# Patient Record
Sex: Female | Born: 1965 | Race: White | Hispanic: No | Smoking: Former smoker
Health system: Southern US, Community
[De-identification: ages and names within clinical notes are randomized; demographics above are authoritative.]

## PROBLEM LIST (undated history)

## (undated) DIAGNOSIS — J939 Pneumothorax, unspecified: Secondary | ICD-10-CM

## (undated) DIAGNOSIS — J9383 Other pneumothorax: Secondary | ICD-10-CM

## (undated) DIAGNOSIS — J930 Spontaneous tension pneumothorax: Principal | ICD-10-CM

## (undated) DIAGNOSIS — G8929 Other chronic pain: Secondary | ICD-10-CM

## (undated) DIAGNOSIS — M810 Age-related osteoporosis without current pathological fracture: Secondary | ICD-10-CM

## (undated) DIAGNOSIS — N390 Urinary tract infection, site not specified: Secondary | ICD-10-CM

## (undated) DIAGNOSIS — F3289 Other specified depressive episodes: Secondary | ICD-10-CM

## (undated) DIAGNOSIS — R569 Unspecified convulsions: Secondary | ICD-10-CM

## (undated) DIAGNOSIS — F419 Anxiety disorder, unspecified: Secondary | ICD-10-CM

## (undated) DIAGNOSIS — Q059 Spina bifida, unspecified: Secondary | ICD-10-CM

## (undated) DIAGNOSIS — K219 Gastro-esophageal reflux disease without esophagitis: Secondary | ICD-10-CM

## (undated) DIAGNOSIS — F329 Major depressive disorder, single episode, unspecified: Secondary | ICD-10-CM

## (undated) DIAGNOSIS — M419 Scoliosis, unspecified: Secondary | ICD-10-CM

## (undated) DIAGNOSIS — I10 Essential (primary) hypertension: Secondary | ICD-10-CM

## (undated) DIAGNOSIS — G894 Chronic pain syndrome: Secondary | ICD-10-CM

## (undated) DIAGNOSIS — M549 Dorsalgia, unspecified: Secondary | ICD-10-CM

## (undated) HISTORY — PX: LUMBAR LAMINECTOMY FOR TETHERED CORD RELEASE: SHX1982

## (undated) HISTORY — DX: Chronic pain syndrome: G89.4

## (undated) HISTORY — PX: BACK SURGERY: SHX140

## (undated) HISTORY — DX: Unspecified convulsions: R56.9

## (undated) HISTORY — DX: Anxiety disorder, unspecified: F41.9

## (undated) HISTORY — DX: Scoliosis, unspecified: M41.9

## (undated) HISTORY — DX: Spina bifida, unspecified: Q05.9

## (undated) HISTORY — DX: Gastro-esophageal reflux disease without esophagitis: K21.9

## (undated) HISTORY — DX: Major depressive disorder, single episode, unspecified: F32.9

## (undated) HISTORY — DX: Other specified depressive episodes: F32.89

## (undated) HISTORY — DX: Essential (primary) hypertension: I10

## (undated) HISTORY — DX: Age-related osteoporosis without current pathological fracture: M81.0

---
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
## 1997-11-27 ENCOUNTER — Emergency Department (HOSPITAL_COMMUNITY): Admission: EM | Admit: 1997-11-27 | Discharge: 1997-11-27 | Payer: Self-pay | Admitting: Emergency Medicine

## 1997-12-03 ENCOUNTER — Emergency Department (HOSPITAL_COMMUNITY): Admission: EM | Admit: 1997-12-03 | Discharge: 1997-12-03 | Payer: Self-pay | Admitting: Emergency Medicine

## 1998-08-30 ENCOUNTER — Encounter: Payer: Self-pay | Admitting: Emergency Medicine

## 1998-08-30 ENCOUNTER — Emergency Department (HOSPITAL_COMMUNITY): Admission: EM | Admit: 1998-08-30 | Discharge: 1998-08-30 | Payer: Self-pay | Admitting: Emergency Medicine

## 1999-02-22 ENCOUNTER — Encounter: Admission: RE | Admit: 1999-02-22 | Discharge: 1999-02-22 | Payer: Self-pay | Admitting: Family Medicine

## 1999-02-22 ENCOUNTER — Encounter: Payer: Self-pay | Admitting: Family Medicine

## 2000-06-17 ENCOUNTER — Other Ambulatory Visit: Admission: RE | Admit: 2000-06-17 | Discharge: 2000-06-17 | Payer: Self-pay | Admitting: Obstetrics and Gynecology

## 2000-07-16 ENCOUNTER — Encounter: Payer: Self-pay | Admitting: Emergency Medicine

## 2000-07-16 ENCOUNTER — Emergency Department (HOSPITAL_COMMUNITY): Admission: EM | Admit: 2000-07-16 | Discharge: 2000-07-16 | Payer: Self-pay | Admitting: Emergency Medicine

## 2000-11-01 ENCOUNTER — Encounter: Payer: Self-pay | Admitting: *Deleted

## 2000-11-01 ENCOUNTER — Ambulatory Visit (HOSPITAL_COMMUNITY): Admission: RE | Admit: 2000-11-01 | Discharge: 2000-11-01 | Payer: Self-pay | Admitting: *Deleted

## 2002-04-04 ENCOUNTER — Other Ambulatory Visit: Admission: RE | Admit: 2002-04-04 | Discharge: 2002-04-04 | Payer: Self-pay | Admitting: Obstetrics and Gynecology

## 2003-07-15 ENCOUNTER — Ambulatory Visit (HOSPITAL_COMMUNITY): Admission: RE | Admit: 2003-07-15 | Discharge: 2003-07-15 | Payer: Self-pay | Admitting: Family Medicine

## 2003-11-08 ENCOUNTER — Emergency Department (HOSPITAL_COMMUNITY): Admission: EM | Admit: 2003-11-08 | Discharge: 2003-11-08 | Payer: Self-pay | Admitting: Emergency Medicine

## 2004-01-10 ENCOUNTER — Ambulatory Visit: Payer: Self-pay | Admitting: *Deleted

## 2004-01-29 ENCOUNTER — Ambulatory Visit: Payer: Self-pay | Admitting: *Deleted

## 2004-03-13 ENCOUNTER — Ambulatory Visit: Payer: Self-pay | Admitting: Family Medicine

## 2004-03-15 ENCOUNTER — Ambulatory Visit: Payer: Self-pay | Admitting: Family Medicine

## 2004-04-11 ENCOUNTER — Ambulatory Visit: Payer: Self-pay | Admitting: Family Medicine

## 2004-10-15 ENCOUNTER — Encounter
Admission: RE | Admit: 2004-10-15 | Discharge: 2005-01-13 | Payer: Self-pay | Admitting: Physical Medicine & Rehabilitation

## 2004-10-15 ENCOUNTER — Ambulatory Visit: Payer: Self-pay | Admitting: Physical Medicine & Rehabilitation

## 2004-11-28 ENCOUNTER — Ambulatory Visit: Payer: Self-pay | Admitting: Physical Medicine & Rehabilitation

## 2004-11-29 ENCOUNTER — Emergency Department (HOSPITAL_COMMUNITY): Admission: EM | Admit: 2004-11-29 | Discharge: 2004-11-29 | Payer: Self-pay | Admitting: Emergency Medicine

## 2004-11-29 ENCOUNTER — Inpatient Hospital Stay (HOSPITAL_COMMUNITY): Admission: RE | Admit: 2004-11-29 | Discharge: 2004-12-01 | Payer: Self-pay | Admitting: Psychiatry

## 2004-11-29 ENCOUNTER — Ambulatory Visit: Payer: Self-pay | Admitting: Psychiatry

## 2004-12-31 ENCOUNTER — Ambulatory Visit: Payer: Self-pay | Admitting: Physical Medicine & Rehabilitation

## 2005-01-16 ENCOUNTER — Encounter
Admission: RE | Admit: 2005-01-16 | Discharge: 2005-04-16 | Payer: Self-pay | Admitting: Physical Medicine & Rehabilitation

## 2005-02-11 ENCOUNTER — Ambulatory Visit: Payer: Self-pay | Admitting: Physical Medicine & Rehabilitation

## 2005-04-05 ENCOUNTER — Ambulatory Visit: Payer: Self-pay | Admitting: Physical Medicine & Rehabilitation

## 2005-05-07 ENCOUNTER — Encounter
Admission: RE | Admit: 2005-05-07 | Discharge: 2005-08-05 | Payer: Self-pay | Admitting: Physical Medicine & Rehabilitation

## 2005-05-07 ENCOUNTER — Ambulatory Visit: Payer: Self-pay | Admitting: Physical Medicine & Rehabilitation

## 2005-06-18 ENCOUNTER — Encounter: Admission: RE | Admit: 2005-06-18 | Discharge: 2005-06-18 | Payer: Self-pay | Admitting: Family Medicine

## 2005-06-24 ENCOUNTER — Encounter: Admission: RE | Admit: 2005-06-24 | Discharge: 2005-06-24 | Payer: Self-pay | Admitting: Family Medicine

## 2005-06-26 ENCOUNTER — Other Ambulatory Visit: Admission: RE | Admit: 2005-06-26 | Discharge: 2005-06-26 | Payer: Self-pay | Admitting: Obstetrics and Gynecology

## 2005-07-02 ENCOUNTER — Ambulatory Visit: Payer: Self-pay | Admitting: Physical Medicine & Rehabilitation

## 2005-08-26 ENCOUNTER — Encounter
Admission: RE | Admit: 2005-08-26 | Discharge: 2005-11-24 | Payer: Self-pay | Admitting: Physical Medicine & Rehabilitation

## 2005-08-26 ENCOUNTER — Ambulatory Visit: Payer: Self-pay | Admitting: Physical Medicine & Rehabilitation

## 2005-10-17 ENCOUNTER — Ambulatory Visit: Payer: Self-pay | Admitting: Physical Medicine & Rehabilitation

## 2005-12-17 ENCOUNTER — Ambulatory Visit: Payer: Self-pay | Admitting: Physical Medicine & Rehabilitation

## 2005-12-17 ENCOUNTER — Encounter
Admission: RE | Admit: 2005-12-17 | Discharge: 2006-03-17 | Payer: Self-pay | Admitting: Physical Medicine & Rehabilitation

## 2006-02-06 ENCOUNTER — Ambulatory Visit: Payer: Self-pay | Admitting: Physical Medicine & Rehabilitation

## 2006-04-03 ENCOUNTER — Ambulatory Visit: Payer: Self-pay | Admitting: Physical Medicine & Rehabilitation

## 2006-04-03 ENCOUNTER — Encounter
Admission: RE | Admit: 2006-04-03 | Discharge: 2006-07-02 | Payer: Self-pay | Admitting: Physical Medicine & Rehabilitation

## 2006-05-07 ENCOUNTER — Ambulatory Visit: Payer: Self-pay | Admitting: Physical Medicine & Rehabilitation

## 2006-05-07 ENCOUNTER — Encounter
Admission: RE | Admit: 2006-05-07 | Discharge: 2006-08-05 | Payer: Self-pay | Admitting: Physical Medicine & Rehabilitation

## 2006-06-03 ENCOUNTER — Ambulatory Visit: Payer: Self-pay | Admitting: Physical Medicine & Rehabilitation

## 2006-07-21 ENCOUNTER — Other Ambulatory Visit: Admission: RE | Admit: 2006-07-21 | Discharge: 2006-07-21 | Payer: Self-pay | Admitting: Obstetrics and Gynecology

## 2006-07-28 ENCOUNTER — Ambulatory Visit: Payer: Self-pay | Admitting: Physical Medicine & Rehabilitation

## 2006-08-24 ENCOUNTER — Encounter
Admission: RE | Admit: 2006-08-24 | Discharge: 2006-11-22 | Payer: Self-pay | Admitting: Physical Medicine & Rehabilitation

## 2006-09-18 ENCOUNTER — Ambulatory Visit: Payer: Self-pay | Admitting: Physical Medicine & Rehabilitation

## 2006-12-21 ENCOUNTER — Ambulatory Visit: Payer: Self-pay | Admitting: Physical Medicine & Rehabilitation

## 2006-12-21 ENCOUNTER — Encounter
Admission: RE | Admit: 2006-12-21 | Discharge: 2007-02-02 | Payer: Self-pay | Admitting: Physical Medicine & Rehabilitation

## 2007-01-15 ENCOUNTER — Encounter
Admission: RE | Admit: 2007-01-15 | Discharge: 2007-01-15 | Payer: Self-pay | Admitting: Physical Medicine & Rehabilitation

## 2007-01-18 ENCOUNTER — Ambulatory Visit: Payer: Self-pay | Admitting: Physical Medicine & Rehabilitation

## 2007-02-03 ENCOUNTER — Ambulatory Visit (HOSPITAL_COMMUNITY)
Admission: RE | Admit: 2007-02-03 | Discharge: 2007-02-03 | Payer: Self-pay | Admitting: Physical Medicine & Rehabilitation

## 2007-02-08 ENCOUNTER — Emergency Department (HOSPITAL_COMMUNITY): Admission: EM | Admit: 2007-02-08 | Discharge: 2007-02-08 | Payer: Self-pay | Admitting: Emergency Medicine

## 2007-02-18 ENCOUNTER — Ambulatory Visit: Payer: Self-pay | Admitting: Physical Medicine & Rehabilitation

## 2007-02-18 ENCOUNTER — Encounter
Admission: RE | Admit: 2007-02-18 | Discharge: 2007-05-19 | Payer: Self-pay | Admitting: Physical Medicine & Rehabilitation

## 2007-02-26 ENCOUNTER — Encounter: Payer: Self-pay | Admitting: Internal Medicine

## 2007-03-17 ENCOUNTER — Ambulatory Visit (HOSPITAL_COMMUNITY): Admission: RE | Admit: 2007-03-17 | Discharge: 2007-03-17 | Payer: Self-pay | Admitting: Neurological Surgery

## 2007-04-20 ENCOUNTER — Ambulatory Visit: Payer: Self-pay | Admitting: Physical Medicine & Rehabilitation

## 2007-04-26 ENCOUNTER — Encounter: Payer: Self-pay | Admitting: Internal Medicine

## 2007-05-13 ENCOUNTER — Encounter
Admission: RE | Admit: 2007-05-13 | Discharge: 2007-08-11 | Payer: Self-pay | Admitting: Physical Medicine & Rehabilitation

## 2007-06-07 ENCOUNTER — Encounter: Payer: Self-pay | Admitting: Internal Medicine

## 2007-06-08 ENCOUNTER — Ambulatory Visit: Payer: Self-pay | Admitting: Physical Medicine & Rehabilitation

## 2007-08-04 ENCOUNTER — Encounter
Admission: RE | Admit: 2007-08-04 | Discharge: 2007-11-02 | Payer: Self-pay | Admitting: Physical Medicine & Rehabilitation

## 2007-08-06 ENCOUNTER — Ambulatory Visit: Payer: Self-pay | Admitting: Physical Medicine & Rehabilitation

## 2007-09-01 ENCOUNTER — Ambulatory Visit: Payer: Self-pay | Admitting: Physical Medicine & Rehabilitation

## 2007-09-29 ENCOUNTER — Ambulatory Visit: Payer: Self-pay | Admitting: Physical Medicine & Rehabilitation

## 2007-10-27 ENCOUNTER — Encounter
Admission: RE | Admit: 2007-10-27 | Discharge: 2008-01-25 | Payer: Self-pay | Admitting: Physical Medicine & Rehabilitation

## 2007-10-27 ENCOUNTER — Ambulatory Visit: Payer: Self-pay | Admitting: Physical Medicine & Rehabilitation

## 2007-11-24 ENCOUNTER — Ambulatory Visit: Payer: Self-pay | Admitting: Physical Medicine & Rehabilitation

## 2007-12-22 ENCOUNTER — Ambulatory Visit: Payer: Self-pay | Admitting: Physical Medicine & Rehabilitation

## 2008-01-19 ENCOUNTER — Ambulatory Visit: Payer: Self-pay | Admitting: Physical Medicine & Rehabilitation

## 2008-02-07 ENCOUNTER — Encounter: Payer: Self-pay | Admitting: Internal Medicine

## 2008-02-07 LAB — CONVERTED CEMR LAB
AST: 23 units/L
CO2: 26 meq/L
Calcium: 9.9 mg/dL
Chloride: 105 meq/L
Hemoglobin: 13.3 g/dL
Potassium: 5.1 meq/L
RBC: 4.29 M/uL
Sodium: 141 meq/L
TSH: 0.75 microintl units/mL
WBC: 4.8 10*3/uL

## 2008-02-15 ENCOUNTER — Encounter
Admission: RE | Admit: 2008-02-15 | Discharge: 2008-05-15 | Payer: Self-pay | Admitting: Physical Medicine & Rehabilitation

## 2008-02-16 ENCOUNTER — Ambulatory Visit: Payer: Self-pay | Admitting: Physical Medicine & Rehabilitation

## 2008-03-01 ENCOUNTER — Ambulatory Visit: Payer: Self-pay | Admitting: Physical Medicine & Rehabilitation

## 2008-03-29 ENCOUNTER — Ambulatory Visit: Payer: Self-pay | Admitting: Physical Medicine & Rehabilitation

## 2008-04-06 ENCOUNTER — Encounter: Payer: Self-pay | Admitting: Internal Medicine

## 2008-04-25 ENCOUNTER — Ambulatory Visit: Payer: Self-pay | Admitting: Physical Medicine & Rehabilitation

## 2008-05-08 ENCOUNTER — Encounter: Payer: Self-pay | Admitting: Internal Medicine

## 2008-05-08 LAB — CONVERTED CEMR LAB
CO2: 27 meq/L
Calcium: 9.1 mg/dL
Chloride: 102 meq/L
Hgb A1c MFr Bld: 5.4 %

## 2008-05-09 ENCOUNTER — Other Ambulatory Visit: Admission: RE | Admit: 2008-05-09 | Discharge: 2008-05-09 | Payer: Self-pay | Admitting: Obstetrics and Gynecology

## 2008-05-19 ENCOUNTER — Encounter
Admission: RE | Admit: 2008-05-19 | Discharge: 2008-08-16 | Payer: Self-pay | Admitting: Physical Medicine & Rehabilitation

## 2008-05-24 ENCOUNTER — Ambulatory Visit: Payer: Self-pay | Admitting: Physical Medicine & Rehabilitation

## 2008-06-01 ENCOUNTER — Encounter: Payer: Self-pay | Admitting: Internal Medicine

## 2008-06-21 ENCOUNTER — Ambulatory Visit: Payer: Self-pay | Admitting: Physical Medicine & Rehabilitation

## 2008-06-27 ENCOUNTER — Encounter: Payer: Self-pay | Admitting: Internal Medicine

## 2008-07-19 ENCOUNTER — Ambulatory Visit: Payer: Self-pay | Admitting: Physical Medicine & Rehabilitation

## 2008-07-31 ENCOUNTER — Encounter: Payer: Self-pay | Admitting: Internal Medicine

## 2008-08-16 ENCOUNTER — Encounter
Admission: RE | Admit: 2008-08-16 | Discharge: 2008-11-14 | Payer: Self-pay | Admitting: Physical Medicine & Rehabilitation

## 2008-08-18 ENCOUNTER — Ambulatory Visit: Payer: Self-pay | Admitting: Physical Medicine & Rehabilitation

## 2008-09-19 ENCOUNTER — Ambulatory Visit: Payer: Self-pay | Admitting: Physical Medicine & Rehabilitation

## 2008-10-20 ENCOUNTER — Ambulatory Visit: Payer: Self-pay | Admitting: Physical Medicine & Rehabilitation

## 2008-11-10 ENCOUNTER — Encounter
Admission: RE | Admit: 2008-11-10 | Discharge: 2009-02-08 | Payer: Self-pay | Admitting: Physical Medicine & Rehabilitation

## 2008-11-15 ENCOUNTER — Ambulatory Visit: Payer: Self-pay | Admitting: Physical Medicine & Rehabilitation

## 2008-12-18 ENCOUNTER — Ambulatory Visit: Payer: Self-pay | Admitting: Physical Medicine & Rehabilitation

## 2008-12-19 ENCOUNTER — Encounter: Payer: Self-pay | Admitting: Internal Medicine

## 2008-12-28 ENCOUNTER — Encounter: Payer: Self-pay | Admitting: Internal Medicine

## 2009-03-06 ENCOUNTER — Encounter: Payer: Self-pay | Admitting: Internal Medicine

## 2009-05-24 ENCOUNTER — Other Ambulatory Visit: Admission: RE | Admit: 2009-05-24 | Discharge: 2009-05-24 | Payer: Self-pay | Admitting: Obstetrics and Gynecology

## 2009-05-24 ENCOUNTER — Encounter: Payer: Self-pay | Admitting: Internal Medicine

## 2009-05-24 LAB — CONVERTED CEMR LAB

## 2009-06-28 ENCOUNTER — Encounter: Payer: Self-pay | Admitting: Internal Medicine

## 2009-08-07 ENCOUNTER — Encounter: Payer: Self-pay | Admitting: Internal Medicine

## 2009-11-03 ENCOUNTER — Emergency Department (HOSPITAL_COMMUNITY): Admission: EM | Admit: 2009-11-03 | Discharge: 2009-11-03 | Payer: Self-pay | Admitting: Emergency Medicine

## 2009-11-13 ENCOUNTER — Ambulatory Visit: Payer: Self-pay | Admitting: Internal Medicine

## 2009-11-13 DIAGNOSIS — Z8679 Personal history of other diseases of the circulatory system: Secondary | ICD-10-CM | POA: Insufficient documentation

## 2009-11-13 DIAGNOSIS — F3289 Other specified depressive episodes: Secondary | ICD-10-CM | POA: Insufficient documentation

## 2009-11-13 DIAGNOSIS — F329 Major depressive disorder, single episode, unspecified: Secondary | ICD-10-CM

## 2009-11-13 DIAGNOSIS — Z9189 Other specified personal risk factors, not elsewhere classified: Secondary | ICD-10-CM | POA: Insufficient documentation

## 2009-11-13 DIAGNOSIS — G894 Chronic pain syndrome: Secondary | ICD-10-CM

## 2009-11-13 DIAGNOSIS — Z87448 Personal history of other diseases of urinary system: Secondary | ICD-10-CM | POA: Insufficient documentation

## 2009-11-13 DIAGNOSIS — Z87891 Personal history of nicotine dependence: Secondary | ICD-10-CM

## 2009-11-13 DIAGNOSIS — I10 Essential (primary) hypertension: Secondary | ICD-10-CM | POA: Insufficient documentation

## 2009-11-13 DIAGNOSIS — K219 Gastro-esophageal reflux disease without esophagitis: Secondary | ICD-10-CM

## 2009-11-13 DIAGNOSIS — R32 Unspecified urinary incontinence: Secondary | ICD-10-CM

## 2009-11-13 DIAGNOSIS — Q059 Spina bifida, unspecified: Secondary | ICD-10-CM

## 2009-11-23 ENCOUNTER — Telehealth (INDEPENDENT_AMBULATORY_CARE_PROVIDER_SITE_OTHER): Payer: Self-pay | Admitting: *Deleted

## 2009-11-26 ENCOUNTER — Encounter: Payer: Self-pay | Admitting: Internal Medicine

## 2009-12-04 ENCOUNTER — Ambulatory Visit: Payer: Self-pay | Admitting: Internal Medicine

## 2009-12-04 DIAGNOSIS — N390 Urinary tract infection, site not specified: Secondary | ICD-10-CM | POA: Insufficient documentation

## 2009-12-04 LAB — CONVERTED CEMR LAB
Bilirubin Urine: NEGATIVE
Blood in Urine, dipstick: NEGATIVE
Specific Gravity, Urine: <1.005

## 2009-12-05 ENCOUNTER — Telehealth: Payer: Self-pay | Admitting: Internal Medicine

## 2009-12-28 ENCOUNTER — Telehealth: Payer: Self-pay | Admitting: Internal Medicine

## 2009-12-31 ENCOUNTER — Telehealth: Payer: Self-pay | Admitting: Internal Medicine

## 2010-01-08 ENCOUNTER — Telehealth (INDEPENDENT_AMBULATORY_CARE_PROVIDER_SITE_OTHER): Payer: Self-pay | Admitting: *Deleted

## 2010-02-05 ENCOUNTER — Encounter: Payer: Self-pay | Admitting: Internal Medicine

## 2010-02-12 ENCOUNTER — Emergency Department (HOSPITAL_COMMUNITY): Admission: EM | Admit: 2010-02-12 | Discharge: 2010-02-16 | Payer: Self-pay | Admitting: Emergency Medicine

## 2010-02-16 ENCOUNTER — Inpatient Hospital Stay (HOSPITAL_COMMUNITY): Admission: RE | Admit: 2010-02-16 | Discharge: 2010-02-27 | Payer: Self-pay | Admitting: Psychiatry

## 2010-02-16 ENCOUNTER — Ambulatory Visit: Payer: Self-pay | Admitting: Psychiatry

## 2010-04-01 ENCOUNTER — Inpatient Hospital Stay (HOSPITAL_COMMUNITY): Admission: EM | Admit: 2010-04-01 | Discharge: 2010-04-02 | Payer: Self-pay | Admitting: Emergency Medicine

## 2010-05-29 ENCOUNTER — Encounter: Payer: Self-pay | Admitting: Internal Medicine

## 2010-06-06 NOTE — Letter (Signed)
[facility]  [facility]   Imported By: Lester [place] 11/30/2009 07:53:36  _____________________________________________________________________  External Attachment:    Type:   Image     Comment:   External Document

## 2010-06-06 NOTE — Letter (Signed)
Summary: Termination/[facility]  Termination/[facility]   Imported By: Lester [place] 11/30/2009 08:01:43  _____________________________________________________________________  External Attachment:    Type:   Image     Comment:   External Document

## 2010-06-06 NOTE — Miscellaneous (Signed)
Summary: Faith Rogue MD/[facility]  Faith Rogue MD/[facility]   Imported By: Lester [place] 11/30/2009 08:08:52  _____________________________________________________________________  External Attachment:    Type:   Image     Comment:   External Document

## 2010-06-06 NOTE — Letter (Signed)
Summary: Health Care Verification Form/Partnership Property Mgt  Health Care Verification Form/Partnership Property Mgt   Imported By: Sherian Rein 12/06/2009 10:42:13  _____________________________________________________________________  External Attachment:    Type:   Image     Comment:   External Document

## 2010-06-06 NOTE — Letter (Signed)
[facility]  [facility]   Imported By: Lester [place] 11/30/2009 07:55:46  _____________________________________________________________________  External Attachment:    Type:   Image     Comment:   External Document

## 2010-06-06 NOTE — Letter (Signed)
Summary: [facility]  [facility]   Imported By: Lester [place] 11/30/2009 07:51:37  _____________________________________________________________________  External Attachment:    Type:   Image     Comment:   External Document

## 2010-06-06 NOTE — Progress Notes (Signed)
Summary: Pain meds  Phone Note Call from Patient Call back at Home Phone [phone]   Caller: Patient Summary of Call: Pt called requesting refill of pain meds while waiting for appt with pain management Initial call taken by: Margaret Pyle, CMA,  January 08, 2010 1:07 PM  Follow-up for Phone Call        since we are still waiting on appt with pain clinic, will give one month oxycodone, no refills after apt is scheduled - will not refill before 30d - please get  [facility] to help [place] move on this - thanks Follow-up by: Newt Lukes MD,  January 08, 2010 1:18 PM  Additional Follow-up for Phone Call Additional follow up Details #1::        Pt advised and will call back on Friday as medications are not due until 01/12/2010.  Note forwarded to [facility]- Additional Follow-up by: Margaret Pyle, CMA,  January 08, 2010 2:20 PM    Additional Follow-up for Phone Call Additional follow up Details #2::    Pt came into clinic requesting refill of Pain meds until pain management referral.  Margaret Pyle, CMA  January 11, 2010 10:14 AM   refill done today as previously agreed while pt awaiting est with pain clinic Newt Lukes MD  January 11, 2010 12:28 PM   Pt informed, Rx in cabinet for pt pick up Follow-up by: Margaret Pyle, CMA,  January 11, 2010 1:10 PM  New/Updated Medications: OXYCODONE HCL 15 MG TABS (OXYCODONE HCL) 1 by mouth four times a day as needed for pain - DO NOT FILL UNTIL 01/12/10 Prescriptions: OXYCODONE HCL 15 MG TABS (OXYCODONE HCL) 1 by mouth four times a day as needed for pain - DO NOT FILL UNTIL 01/12/10  #120 x 0   Entered by:   Margaret Pyle, CMA   Authorized by:   Newt Lukes MD   Signed by:   Margaret Pyle, CMA on 01/11/2010   Method used:   Print then Give to Patient   RxID:   [id number] OXYCODONE HCL 15 MG TABS (OXYCODONE HCL) 1 by mouth four times a day as needed for pain - DO  NOT FILL UNTIL 12/13/09  #120 x 0   Entered and Authorized by:   Newt Lukes MD   Signed by:   Newt Lukes MD on 01/11/2010   Method used:   Print then Give to Patient   RxID:   [id number]

## 2010-06-06 NOTE — Letter (Signed)
Summary: Nicki Reaper MD  Nicki Reaper MD   Imported By: Lester [place] 11/30/2009 07:40:18  _____________________________________________________________________  External Attachment:    Type:   Image     Comment:   External Document

## 2010-06-06 NOTE — Letter (Signed)
Summary: [facility]  [facility]   Imported By: Lester [place] 11/30/2009 07:56:45  _____________________________________________________________________  External Attachment:    Type:   Image     Comment:   External Document

## 2010-06-06 NOTE — Progress Notes (Signed)
Summary: Tramadol request  Phone Note Call from Rhonda Ramos Call back at Home Phone [phone]   Caller: Rhonda Ramos Summary of Call: Pt called stating that she has had to take more of the Oxycodone than Rx'd due to Uterine cramps and bleeding. Pt is out of pain meds and is requesting Rx for Tramadol to use until Oxycodone can be refilled or she can get appt with pain clinic. Please advise? Initial call taken by: Margaret Pyle, CMA,  December 31, 2009 9:20 AM  Follow-up for Phone Call        refill on tramadol ok - erx done Follow-up by: Newt Lukes MD,  December 31, 2009 10:27 AM  Additional Follow-up for Phone Call Additional follow up Details #1::        pt informed  Additional Follow-up by: Margaret Pyle, CMA,  December 31, 2009 10:33 AM    Prescriptions: ULTRAM 50 MG TABS (TRAMADOL HCL) take 1-2 by mouth once daily  #60 x 6   Entered and Authorized by:   Newt Lukes MD   Signed by:   Newt Lukes MD on 12/31/2009   Method used:   Electronically to        [facility]  [address] [id number]* (retail)       [address]       [place], [place]  [address]       Ph: [phone] or [phone]       Fax: [phone]   RxID:   [id number]

## 2010-06-06 NOTE — Letter (Signed)
[facility]  [facility]   Imported By: Lester [place] 11/30/2009 07:52:40  _____________________________________________________________________  External Attachment:    Type:   Image     Comment:   External Document

## 2010-06-06 NOTE — Letter (Signed)
[facility]   Imported By: Lester [place] 11/30/2009 07:59:44  _____________________________________________________________________  External Attachment:    Type:   Image     Comment:   External Document

## 2010-06-06 NOTE — Consult Note (Signed)
Summary: [facility]   Imported By: Sherian Rein 02/11/2010 14:20:02  _____________________________________________________________________  External Attachment:    Type:   Image     Comment:   External Document

## 2010-06-06 NOTE — Letter (Signed)
[facility]  [facility]   Imported By: Lester [place] 11/30/2009 07:58:11  _____________________________________________________________________  External Attachment:    Type:   Image     Comment:   External Document

## 2010-06-06 NOTE — Progress Notes (Signed)
Summary: Call Report  Phone Note Other Incoming   Caller: [facility] Summary of Call: [facility] Triage Call Report Triage Record Num: [id number] Operator: Dayton Martes Patient Name: Rhonda Ramos Call Date & Time: 12/27/2009 6:58:13PM Patient Phone: [phone] PCP: Patient Gender: Female PCP Fax : Patient DOB: Sep 11, 1965 Practice Name: [facility] Reason for Call: Pt sts that she has been calling [facility] since last Thurs (12/20/09) regarding her BCP. Pt sts that she used [facility] but they changed it to [place] because she was a smoker. Pt sts that she stopped smoking 2 months ago and wants to be switched back to the [facility] because the Shaune Pascal is causing breakthrough bleeding and cramping. Pt sts that she has not been able to get a call back from her OB/GYN. Onset bleeding and cramping  ~ 2 wks ago. Rates pain 11/10. Pt has taken Advil 400mg  Q 6hrs and Oxycodone QID with no relief. RN adv ER now. Protocol(s) Used: Contraception: Hormonal Protocol(s) Used: Vaginal Bleeding: Premenopausal, Abnormal Recommended Outcome per Protocol: See ED Immediately Reason for Outcome: Abnormal vaginal bleeding other than mild or vaginal spotting or scanty flow for more than a month Unbearable abdominal/pelvic pain or cramping Care Advice:  ~ Allow the patient to be in a position of comfort.  ~ Another adult should drive.  ~ Do not give the patient anything to eat or drink. Call EMS 911 if signs and symptoms of shock develop (such as unable to stand due to faintness, dizziness, or lightheadedness; new onset of confusion; slow to respond or difficult to awaken; skin is pale, gray, cool, or moist to touch; severe weakness; loss of consciousness).  ~  ~ IMMEDIATE ACTION Write down provider's name. List or place the following in a bag for transport with the patient: current prescription and/or nonprescription medications; alternative treatments, therapies and medications; and street drugs.   ~ 12/27/2009 7:10:16PM Page 1 of 1 [email or web address] Initial call taken by: Margaret Pyle, CMA,  December 28, 2009 8:08 AM  Follow-up for Phone Call        noted - Follow-up by: Newt Lukes MD,  December 28, 2009 9:21 AM

## 2010-06-06 NOTE — Progress Notes (Signed)
Summary: call back - narc w/d sx?  Phone Note Call from Patient Call back at Home Phone [phone]   Caller: Patient Summary of Call: Patient lmovm stating that she was seen in office 12/04/09 for kidney infection. She is requesting a call back due to not feeling well.Marland KitchenMarland KitchenMarland KitchenAlvy Beal Archie CMA  December 05, 2009 8:24 AM   Follow-up for Phone Call        Pt states that she has not slept all night and she thinks it is related to withdrawl from her pain medications. Pt is requesting VAL authorize early refill of her pain meds. After speaking with VAL, pt was advised that if she believes she is having withdrawl she should be seen in the ER for eval as withdrawl sxs can be serious. I also reminded pt that VAL and CMA explained to her at her OV that her pain meds will NOT be filled before 12/13/2009.  Pt then stated that she does not have a ride to get to the ER. I advised asking a neighbor or friend for transport. Pt agreed to go to the ER Follow-up by: Margaret Pyle, CMA,  December 05, 2009 9:27 AM  Additional Follow-up for Phone Call Additional follow up Details #1::        noted - agree - thx Additional Follow-up by: Newt Lukes MD,  December 05, 2009 9:37 AM     Appended Document: call back - narc w/d sx? Pt called back to apologized for requesting early refillsof pain edication after she was told by VAL and CMA at OV that there will be no early refill, no matter what. Pt stated that she wants to have a good relationship with MD and she does not want her to go against her "ethics". VAL has been advised.

## 2010-06-06 NOTE — Progress Notes (Signed)
  Phone Note Other Incoming   Request: Send information Summary of Call: Records received from Dr. Merri Brunette. 113 pages forwarded to Dr. Felicity Coyer for review.

## 2010-06-06 NOTE — Progress Notes (Signed)
Summary: Rx req  Phone Note Call from Patient Call back at Home Phone [phone]   Caller: Patient Summary of Call: Pt called stating that she is experiencing cramping, pain and vaginal bleeding (see [facility] report). Pt was advised by CAN to go to ER but decided not to because she was able to get appt with GYN in 2 weeks. Pt states that her regular GYN is only in 2 days a week and the other physicians are unwilling to change medication or Rx her pain meds, they recommended she contact PCP. Pt is requesting Rx for Pain medications and [facility] until appt with GYN. Per VAL, pt advised to go to [facility] ER dept for eval of pain and bleeding. Pt agreed. Initial call taken by: Margaret Pyle, CMA,  December 28, 2009 3:01 PM  Follow-up for Phone Call        noted and agree - thanks Follow-up by: Newt Lukes MD,  December 28, 2009 3:51 PM

## 2010-06-06 NOTE — Letter (Signed)
Summary: [facility]  [facility]   Imported By: Lester [place] 11/30/2009 07:54:51  _____________________________________________________________________  External Attachment:    Type:   Image     Comment:   External Document

## 2010-06-06 NOTE — Assessment & Plan Note (Signed)
Summary: ?kidney infection/cd   Vital Signs:  Patient profile:   45 year old female Height:      56 inches (142.24 cm) Weight:      91.0 pounds (41.36 kg) O2 Sat:      98 % on Room air Temp:     99.6 degrees F (37.56 degrees C) oral Pulse rate:   118 / minute BP sitting:   142 / 78  (left arm) Cuff size:   regular  Vitals Entered By: Orlan Leavens RMA (December 04, 2009 1:18 PM)  O2 Flow:  Room air CC: ? UTI/ fever, evaluation of UTI's Is Patient Diabetic? No   Primary Care Provider:  Newt Lukes MD  CC:  ? UTI/ fever and evaluation of UTI's.  History of Present Illness:       This is a 45 year old female who presents for evaluation of UTI's.  The patient is here today for evaluation and treatment of recurrent UTI's.  The patient is is not pregnant.  Risk factors for UTI's include urethral stricture, prior catheterization, and history of VUR.  There is no history of calculus disease and postmenopausal.  Prior evaluation has included urine cultures and urinalysis.    Clinical Review Panels:  Prevention   Last Pap Smear:  Interpretation Result:Negative for intraepithelial Lesion or Malignancy.    (05/24/2009)  Immunizations   Last Tetanus Booster:  Historical (05/05/2002)   Last Pneumovax:  Historical (05/05/2008)  CBC   WBC:  4.8 (02/07/2008)   RBC:  4.29 (02/07/2008)   Hgb:  13.3 (02/07/2008)   Hct:  38.9 (02/07/2008)   Platelets:  333 (02/07/2008)   MCV  90.7 (02/07/2008)   RDW  12.6 (02/07/2008)  Complete Metabolic Panel   Glucose:  90 (05/08/2008)   Sodium:  135 (05/08/2008)   Potassium:  5.3 (05/08/2008)   Chloride:  102 (05/08/2008)   CO2:  27 (05/08/2008)   BUN:  13 (05/08/2008)   Creatinine:  0.80 (05/08/2008)   Albumin:  3.4 (02/07/2008)   Total Protein:  6.7 (02/07/2008)   Calcium:  9.1 (05/08/2008)   Total Bili:  0.4 (02/07/2008)   Alk Phos:  76 (02/07/2008)   SGPT (ALT):  15 (02/07/2008)   SGOT (AST):  23 (02/07/2008)   Current  Medications (verified): 1)  Ultram 50 Mg Tabs (Tramadol Hcl) .... Take 1-2 By Mouth Once Daily 2)  Klonopin 1 Mg Tabs (Clonazepam) .... Take 1-3 By Mouth As Needed 3)  Ditropan Xl 10 Mg Xr24h-Tab (Oxybutynin Chloride) .... Take 1 By Mouth Once Daily 4)  Prozac 20 Mg Caps (Fluoxetine Hcl) .... Take 3 By Mouth Once Daily 5)  Amlodipine Besylate 5 Mg Tabs (Amlodipine Besylate) .Marland Kitchen.. 1 By Mouth Once Daily 6)  Oxycodone Hcl 15 Mg Tabs (Oxycodone Hcl) .Marland Kitchen.. 1 By Mouth Four Times A Day As Needed For Pain  Allergies (verified): 1)  ! Erythromycin 2)  ! Codeine 3)  ! * Lisinopril 4)  ! Prednisone 5)  ! 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  The patient denies chest pain, headaches, abdominal pain, and hematuria.    Physical Exam  General:  small statured woman, older appearing than stated age, uncomfortable from pain while sitting Lungs:  normal respiratory effort, no intercostal retractions or use of accessory muscles; normal breath sounds bilaterally - no crackles and no wheezes.    Heart:  normal rate, regular rhythm, no murmur, and no rub. BLE without edema. Abdomen:  soft, non-tender, normal bowel sounds, no distention; no masses and no appreciable hepatomegaly or splenomegaly.     Impression & Recommendations:  Problem # 1:  UTI (ICD-599.0)  Her updated medication list for this problem includes:    Ditropan Xl 10 Mg Xr24h-tab (Oxybutynin chloride) .Marland Kitchen... Take 1 by mouth once daily    Cipro 500 Mg Tabs (Ciprofloxacin hcl) .Marland Kitchen... 1 by mouth two times a day x 7 days  Orders: Prescription Created Electronically [phone]) T-Culture,  Urine ([id number])  Encouraged to push clear liquids, get enough rest, and take acetaminophen as needed. To be seen in 10 days if no improvement, sooner if worse.  Problem # 2:  CHRONIC PAIN SYNDROME (ICD-338.4)  related to spina bifida/scoli deformity and mutlipe surg interventions for same - not currently in pain mgmt clinic but will referral pending at this time as i do not maaneg chronic narc needs - one month refill of oxyIR provided  to fill 12/13/09 while working on this process  Problem # 3:  HYPERTENSION (ICD-401.9)  Her updated medication list for this problem includes:    Amlodipine Besylate 5 Mg Tabs (Amlodipine besylate) .Marland Kitchen... 1 by mouth once daily  BP today: 142/78 Prior BP: 130/92 (11/13/2009)  Labs Reviewed: K+: 5.3 (05/08/2008) Creat: : 0.80 (05/08/2008)     Complete Medication List: 1)  Ultram 50 Mg Tabs (Tramadol hcl) .... Take 1-2 by mouth once daily 2)  Klonopin 1 Mg Tabs (Clonazepam) .... Take 1-3 by mouth as needed 3)  Ditropan Xl 10 Mg Xr24h-tab (Oxybutynin chloride) .... Take 1 by mouth once daily 4)  Prozac 20 Mg Caps (Fluoxetine hcl) .... Take 3 by mouth once daily 5)  Amlodipine Besylate 5 Mg Tabs (Amlodipine besylate) .Marland Kitchen.. 1 by mouth once daily 6)  Oxycodone Hcl 15 Mg Tabs (Oxycodone hcl) .Marland Kitchen.. 1 by mouth four times a day as needed for pain - do not fill until 12/13/09 7)  Cipro 500 Mg Tabs (Ciprofloxacin hcl) .Marland Kitchen.. 1 by mouth two times a day x 7 days 8)  Flomax 0.4 Mg Caps (Tamsulosin hcl) .Marland Kitchen.. 1 by mouth once daily  Other Orders: UA Dipstick w/o Micro (manual) ([id number])  Patient Instructions: 1)  it was good to see you today. 2)  one month more oxycodone provided today to fill on 12/13/09; this will be your last pain medication presciption from this office while awaiting establishment with new pain management clinic - we will not continuously prescribe your ongoing monthly pain medications after that time as discussed today - NO MATTER WHAT! 3)  Cipro  for bladder infection 4)  we'll followup on referral to pain managment center as discussed. Our office will contact you regarding this appointment once made. 5)  form for apartment signed as requested  6)  no mother medication changes today 7)  Please schedule a follow-up appointment in 3 months to review, sooner if problems.  Prescriptions: OXYCODONE HCL 15 MG TABS (OXYCODONE HCL) 1 by mouth four times a day as needed for pain - DO NOT FILL UNTIL 12/13/09  #120 x 0   Entered and Authorized by:   Newt Lukes MD   Signed by:  Newt Lukes MD on 12/04/2009   Method used:   Print then Give to Patient   RxID:   [id number] DITROPAN XL 10 MG XR24H-TAB (OXYBUTYNIN CHLORIDE) take 1 by mouth once daily  #30 x 5   Entered and Authorized by:   Newt Lukes MD   Signed by:   Newt Lukes MD on 12/04/2009   Method used:   Electronically to        [facility]  [address] # [facility]* (retail)       [address]       [place], [place]  [address]       Ph: [phone] or [phone]       Fax: [phone]   RxID:   [id number] FLOMAX 0.4 MG CAPS (TAMSULOSIN HCL) 1 by mouth once daily  #30 x 6   Entered and Authorized by:   Newt Lukes MD   Signed by:   Newt Lukes MD on 12/04/2009   Method used:   Electronically to        [facility]  [address] [facility]* (retail)       [address]       [place], [place]  [address]       Ph: [phone] or [phone]       Fax: [phone]   RxID:   [id number] CIPRO 500 MG TABS (CIPROFLOXACIN HCL) 1 by mouth two times a day x 7 days  #14 x 0   Entered and Authorized by:   Newt Lukes MD   Signed by:   Newt Lukes MD on 12/04/2009   Method used:   Electronically to        [facility]  [address] [facility]* (retail)       [address]       [place], [place]  [address]       Ph: [phone] or [phone]       Fax: [phone]   RxID:    [id number]   Laboratory Results   Urine Tests    Routine Urinalysis   Color: lt. yellow Appearance: Clear Glucose: negative   (Normal Range: Negative) Bilirubin: negative   (Normal Range: Negative) Ketone: negative   (Normal Range: Negative) Spec. Gravity: <1.005   (Normal Range: 1.003-1.035) Blood: negative   (Normal Range: Negative) Protein: negative   (Normal Range: Negative) Urobilinogen: 0.2   (Normal Range: 0-1) Nitrite: negative   (Normal Range: Negative) Leukocyte Esterace: large   (Normal Range: Negative)
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
## 2010-06-12 ENCOUNTER — Telehealth: Payer: Self-pay | Admitting: Internal Medicine

## 2010-06-12 NOTE — Letter (Signed)
Summary: Verification of Disability Status/[facility]  Verification of Disability Status/[facility]   Imported By: Sherian Rein 06/03/2010 10:41:06  _____________________________________________________________________  External Attachment:    Type:   Image     Comment:   External Document

## 2010-06-18 ENCOUNTER — Telehealth: Payer: Self-pay | Admitting: Internal Medicine

## 2010-06-20 NOTE — Progress Notes (Signed)
Summary: Med req  Phone Note Call from Patient   Caller: Patient [phone] Summary of Call: Pt called requesting refills of her pain medication stating she missed appt with pain mgmt because she was in the hospital. Per pain mgmt, Marcelino Duster, pt had one appt 02/05/2010 and did not reschedule or miss any appt. Pt also said that she called to reschedule today after I gave her the number and was told that it could be a few days but I was told by Marcelino Duster that pt called requesting a refill of medications and was transferred to triage, she made no mention of an appt. Please advise. Initial call taken by: Margaret Pyle, CMA,  June 12, 2010 2:51 PM  Follow-up for Phone Call        noted - no pain meds from me for this problem as preiously stated- thanks Follow-up by: Newt Lukes MD,  June 12, 2010 4:09 PM  Additional Follow-up for Phone Call Additional follow up Details #1::        Pt advised and states she has an appt with pain clinic to day at 3p Additional Follow-up by: Margaret Pyle, CMA,  June 13, 2010 7:56 AM

## 2010-06-21 ENCOUNTER — Ambulatory Visit: Payer: Self-pay | Admitting: Internal Medicine

## 2010-06-26 NOTE — Progress Notes (Signed)
Summary: Call from MD?   Phone Note Call from Patient Call back at [phone]   Summary of Call: Pt left vm - req to speak to MD directly, says for personal matter that she is embarrased about.  Initial call taken by: Lamar Sprinkles, CMA,  June 18, 2010 3:12 PM  Follow-up for Phone Call        pt needs to either discuss with triage or make OV to discuss - thanks Follow-up by: Newt Lukes MD,  June 18, 2010 3:14 PM  Additional Follow-up for Phone Call Additional follow up Details #1::        pt states that she is unable to sched. an OV right now due to financial reasons and that she will call back to schedule OV with VAL Additional Follow-up by: Brenton Grills CMA Duncan Dull),  June 18, 2010 3:58 PM

## 2010-07-16 LAB — URINE MICROSCOPIC-ADD ON

## 2010-07-16 LAB — RAPID URINE DRUG SCREEN, HOSP PERFORMED
Amphetamines: NOT DETECTED
Barbiturates: NOT DETECTED
Tetrahydrocannabinol: NOT DETECTED

## 2010-07-16 LAB — BASIC METABOLIC PANEL
Calcium: 8.6 mg/dL (ref 8.4–10.5)
GFR calc non Af Amer: >60 mL/min (ref >60)
Potassium: 4.2 mEq/L (ref 3.5–5.1)
Sodium: 136 mEq/L (ref 135–145)

## 2010-07-16 LAB — CBC
HCT: 38.1 % (ref 36.0–46.0)
HCT: 38.5 % (ref 36.0–46.0)
Hemoglobin: 12.9 g/dL (ref 12.0–15.0)
MCH: 30.4 pg (ref 26.0–34.0)
MCHC: 33.5 g/dL (ref 30.0–36.0)
MCV: 89.6 fL (ref 78.0–100.0)
Platelets: 266 10*3/uL (ref 150–400)
Platelets: 312 10*3/uL (ref 150–400)
RBC: 4.25 MIL/uL (ref 3.87–5.11)
RBC: 4.88 MIL/uL (ref 3.87–5.11)
RDW: 14.4 % (ref 11.5–15.5)
WBC: 16.2 10*3/uL — ABNORMAL HIGH (ref 4.0–10.5)
WBC: 6.2 10*3/uL (ref 4.0–10.5)

## 2010-07-16 LAB — COMPREHENSIVE METABOLIC PANEL
AST: 38 U/L — ABNORMAL HIGH (ref 0–37)
Albumin: 3.3 g/dL — ABNORMAL LOW (ref 3.5–5.2)
Albumin: 4 g/dL (ref 3.5–5.2)
Alkaline Phosphatase: 76 U/L (ref 39–117)
Alkaline Phosphatase: 92 U/L (ref 39–117)
BUN: 11 mg/dL (ref 6–23)
CO2: 22 mEq/L (ref 19–32)
Chloride: 102 mEq/L (ref 96–112)
Chloride: 106 mEq/L (ref 96–112)
Creatinine, Ser: 1.01 mg/dL (ref 0.4–1.2)
GFR calc Af Amer: 55 mL/min — ABNORMAL LOW (ref >60)
GFR calc non Af Amer: 60 mL/min — ABNORMAL LOW (ref >60)
Glucose, Bld: 89 mg/dL (ref 70–99)
Potassium: 4.5 mEq/L (ref 3.5–5.1)
Sodium: 136 mEq/L (ref 135–145)
Total Bilirubin: 0.4 mg/dL (ref 0.3–1.2)
Total Bilirubin: 0.6 mg/dL (ref 0.3–1.2)

## 2010-07-16 LAB — URINALYSIS, ROUTINE W REFLEX MICROSCOPIC
Ketones, ur: NEGATIVE mg/dL
Nitrite: NEGATIVE
Protein, ur: 30 mg/dL — AB
pH: 6 (ref 5.0–8.0)

## 2010-07-16 LAB — CULTURE, BLOOD (ROUTINE X 2)
Culture: NO GROWTH
Culture: NO GROWTH

## 2010-07-16 LAB — URINE CULTURE: Colony Count: NO GROWTH

## 2010-07-16 LAB — POCT I-STAT, CHEM 8
BUN: 15 mg/dL (ref 6–23)
Calcium, Ion: 1.18 mmol/L (ref 1.12–1.32)
Chloride: 106 mEq/L (ref 96–112)
Creatinine, Ser: 1.2 mg/dL (ref 0.4–1.2)
Sodium: 136 mEq/L (ref 135–145)
TCO2: 23 mmol/L (ref 0–100)

## 2010-07-16 LAB — MRSA PCR SCREENING: MRSA by PCR: NEGATIVE

## 2010-07-16 LAB — DIFFERENTIAL
Basophils Absolute: 0 10*3/uL (ref 0.0–0.1)
Basophils Absolute: 0.1 10*3/uL (ref 0.0–0.1)
Basophils Relative: 0 % (ref 0–1)
Basophils Relative: 0 % (ref 0–1)
Eosinophils Relative: 0 % (ref 0–5)
Lymphocytes Relative: 29 % (ref 12–46)
Monocytes Absolute: 1.1 10*3/uL — ABNORMAL HIGH (ref 0.1–1.0)
Neutro Abs: 5.2 10*3/uL (ref 1.7–7.7)
Neutrophils Relative %: 61 % (ref 43–77)

## 2010-07-17 LAB — CBC
HCT: 41.7 % (ref 36.0–46.0)
Hemoglobin: 14.3 g/dL (ref 12.0–15.0)
MCHC: 34.3 g/dL (ref 30.0–36.0)
RDW: 13.9 % (ref 11.5–15.5)
WBC: 10.9 10*3/uL — ABNORMAL HIGH (ref 4.0–10.5)

## 2010-07-17 LAB — COMPREHENSIVE METABOLIC PANEL
ALT: 18 U/L (ref 0–35)
AST: 29 U/L (ref 0–37)
Alkaline Phosphatase: 78 U/L (ref 39–117)
Calcium: 9.3 mg/dL (ref 8.4–10.5)
GFR calc Af Amer: >60 mL/min (ref >60)
Glucose, Bld: 62 mg/dL — ABNORMAL LOW (ref 70–99)
Potassium: 3.1 mEq/L — ABNORMAL LOW (ref 3.5–5.1)
Sodium: 135 mEq/L (ref 135–145)
Total Protein: 7.3 g/dL (ref 6.0–8.3)

## 2010-07-17 LAB — DIFFERENTIAL
Basophils Relative: 1 % (ref 0–1)
Eosinophils Absolute: 0.1 10*3/uL (ref 0.0–0.7)
Eosinophils Relative: 1 % (ref 0–5)
Lymphs Abs: 2.7 10*3/uL (ref 0.7–4.0)
Monocytes Absolute: 0.6 10*3/uL (ref 0.1–1.0)
Monocytes Relative: 6 % (ref 3–12)

## 2010-07-17 LAB — URINALYSIS, ROUTINE W REFLEX MICROSCOPIC
Glucose, UA: NEGATIVE mg/dL
Hgb urine dipstick: NEGATIVE
Ketones, ur: NEGATIVE mg/dL
Protein, ur: NEGATIVE mg/dL
Urobilinogen, UA: 1 mg/dL (ref 0.0–1.0)

## 2010-07-17 LAB — RAPID URINE DRUG SCREEN, HOSP PERFORMED
Amphetamines: NOT DETECTED
Barbiturates: NOT DETECTED
Cocaine: NOT DETECTED
Opiates: POSITIVE — AB

## 2010-07-17 LAB — ACETAMINOPHEN LEVEL: Acetaminophen (Tylenol), Serum: <10 ug/mL — ABNORMAL LOW (ref 10–30)

## 2010-07-17 LAB — ETHANOL: Alcohol, Ethyl (B): <5 mg/dL (ref 0–10)

## 2010-07-17 LAB — URINE MICROSCOPIC-ADD ON

## 2010-08-27 ENCOUNTER — Encounter: Payer: Self-pay | Admitting: Internal Medicine

## 2010-08-28 ENCOUNTER — Ambulatory Visit (INDEPENDENT_AMBULATORY_CARE_PROVIDER_SITE_OTHER): Payer: Medicare Other | Admitting: Internal Medicine

## 2010-08-28 ENCOUNTER — Encounter: Payer: Self-pay | Admitting: Internal Medicine

## 2010-08-28 DIAGNOSIS — I10 Essential (primary) hypertension: Secondary | ICD-10-CM

## 2010-08-28 DIAGNOSIS — R569 Unspecified convulsions: Secondary | ICD-10-CM

## 2010-08-28 DIAGNOSIS — R87619 Unspecified abnormal cytological findings in specimens from cervix uteri: Secondary | ICD-10-CM

## 2010-08-28 DIAGNOSIS — G894 Chronic pain syndrome: Secondary | ICD-10-CM

## 2010-08-28 MED ORDER — TRAMADOL HCL 50 MG PO TABS: 50.0000 mg | ORAL_TABLET | Freq: Three times a day (TID) | ORAL | Status: DC | PRN

## 2010-08-28 MED ORDER — NORETHINDRONE 0.35 MG PO TABS: 1.0000 | ORAL_TABLET | Freq: Every day | ORAL | Status: DC

## 2010-08-28 NOTE — Assessment & Plan Note (Signed)
Related to LBP and hips and scoli with surgeries Try tramadol if ok with neuro as off narcotics (s/p rehab) since fall 2011 Time spent with pt 25 minutes, greater than 50% time spent counseling patient on pain, detox processs and medication review. Also review of prior records
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
## 2010-08-28 NOTE — Patient Instructions (Signed)
It was good to see you today. Good job on rehab!!! Talk with your neurologist about the tramadol and your seizures (before you take the tramadol) we'll make referral to gynecologist for your abnormal PAP. Our office will contact you regarding appointment(s) once made. Refill on medication(s) as discussed today. Please schedule followup in 3-4 months, call sooner if problems.

## 2010-08-29 NOTE — Assessment & Plan Note (Signed)
The current medical regimen is effective;  continue present plan and medications. BP Readings from Last 3 Encounters:  08/28/10 128/82  12/04/09 142/78  11/13/09 130/92

## 2010-09-17 NOTE — Assessment & Plan Note (Signed)
REASON FOR PRESENTATION:  Rhonda Ramos is back regarding her chronic low back  pain related to her spina bifida and tethered cord.  The pain continues  to be a problem.  Apparently, she lost some of her medications and we  replaced these earlier in the month.  She has been pretty reliable  otherwise.  The patient readily states that her pain is a 6/10 to 7/10  and is most prominent in the back with radiation somewhat into the legs.  The pain is sharp and stabbing and constant.  She feels that she has to  rely more on her walker these days as opposed to previously, where she  could use her cane for balance.  The patient is limited in distance  walking at this point.  She uses OxyContin 40 mg q.8 h. with Roxicodone  15 mg q.12 h. p.r.n.   REVIEW OF SYSTEMS:  Notable for the above.  Full review is written  health history section on the chart.   SOCIAL HISTORY:  Without substantial change, other than she lost another  family member recently.   PHYSICAL EXAMINATION:  VITAL SIGNS:  Blood pressure is 117/72, pulse is  95, respiratory rate is 16 and she is saturating 98% on room air.  NEUROMUSCULAR:  The patient is generally restless and having significant  pain in her back when sitting as well as standing.  She changed  positions quite frequently to alleviate pain.  Basically, she walks with  a shuffling gait using a walker.  She had tenderness along the mid  thoracic area as well as bilateral PSIS areas.  Areas were more tender  with compression.  We did not perform a Patrick's test today.  The  patient also has pain with extension of the back.  She was limited with  flexion and extension voluntarily, however.  Strength is generally  stable at 3+ to 4/5.  Reflexes are 1+ and sensory exam is 1/2.   ASSESSMENT:  1. Chronic back pain related to spina bifida and tethered cord      syndrome with concomitant spondylosis.  Patient with facet      overgrowth at multiple levels in the lumbar spine.  2.  Sacroiliac joint pain.  3. Hypertension.  4. Neurogenic bladder.  5. History of greater trochanter bursitis.  6. History of post-traumatic stress disorder.  7. History of constipation.   PLAN:  1. Continue OxyContin 40 mg q.8 h. and Roxicodone for breakthrough      pain.  2. I will sent the patient for bilateral SI joint blocks, as the SI      joints seem to be most symptomatic at this point.  Consider facet      blocks as well.  3. Encouraged appropriate diet and exercise as tolerated.  4. I will see her back pending injection series with Dr. Wynn Banker.      Ranelle Oyster, M.D.  Electronically Signed     ZTS/MedQ  D:  12/22/2006 12:40:33  T:  12/23/2006 07:59:31  Job #:  [id number]   cc:   Dario Guardian, M.D.  Fax: [phone]

## 2010-09-17 NOTE — Assessment & Plan Note (Signed)
Rhonda Ramos is back regarding her back and leg pain.  She has had good results  with Topamax at 75 mg q.h.s.  Her leg pain is essentially gone.  Her  back pain is controlled with the OxyContin and Roxicodone.  She has lost  weight, however, perhaps 10-15 pounds over the last 2-3 months.  She had  no other issues other than that.  She has been a bit more active.  Socially, she has made some friends and has some circles to operating  now.  She rates her pain as 4-5/10.  She describes pain as aching and  constant over the back and hips.   REVIEW OF SYSTEMS:  Notable for the above  Full review is written out in  history section in the chart.   SOCIAL HISTORY:  Unchanged.   PHYSICAL EXAMINATION:  VITAL SIGNS:  Blood pressure is 130/93, pulse  124, respiratory rate 18, she is sating 97% on room air.  GENERAL:  The patient is pleasant, alert, and oriented x3.  Affect is  bright and appropriate.  She has lost noticeable weight.  She is moving  much more freely and quickly today.  She actually was walking too fast  and almost tripped herself up on one occasion.  She uses her cane for  balance.  Strength is 4/5 in legs with decreased fine motor coordination  overall.  Sensory exam is diminished somewhat in legs.  Posture is  unchanged essentially.  Cognition is appropriate.  HEART:  Tachycardic.  CHEST:  Clear.  ABDOMEN:  Soft and nontender.   ASSESSMENT:  1. Spina bifida with persistent axial pain.  Radicular pain is      improved after Topamax trial.  The patient has a tethered cord      syndrome.  2. Sacroiliac dysfunction.  3. Neurogenic bladder.  4. Posttraumatic stress disorder.  5. History of constipation.   PLAN:  1. Continue OxyContin 60 mg q.8 h. and Roxicodone 15 mg 1-2 q.8 h.      p.r.n.  2. Maintain Topamax at 75 mg q.h.s.  I asked her if she wanted to go      up or down a bit on this to simplify the pill number, but she      preferred to stay on what she was using.  3. I  assured her that her weight loss is most likely due to the      Topamax.  If she would lose further weight, i.e., drop below 80      pounds, I asked her to call me back but I am fairly confident this      is the issue here.  She should plateau at this weight and it sounds      as if she is doing that already.  4. I will see you back in 3 months with follow up with nursing in one      month.      Ranelle Oyster, M.D.  Electronically Signed     ZTS/MedQ  D:  10/29/2007 12:27:12  T:  10/30/2007 03:51:36  Job #:  [id number]   cc:   Tyron Russell
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
## 2010-09-17 NOTE — Assessment & Plan Note (Signed)
The patient referred for an SI joint injection, left side.  She rates a  3 out of 10 pain.  She has a history of meningomyocele.  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  We discussed injecting the lower-  most of what appears to be a partial SI joint but instead elected to  send her for imaging of the sacrum to get a better idea of anatomy.  I  will discuss this case with Dr. Stevphen Rochester to see if he has had any  experience with results of SI joint injection in this type of abnormal  anatomic situation.  We will hold off on injections for now.  Have  refilled her medication for Dr. Riley Kill, and he will see her back next  month.      Erick Colace, M.D.  Electronically Signed     AEK/MedQ  D:  01/18/2007 14:52:41  T:  01/18/2007 20:16:07  Job #:  [id number]

## 2010-09-17 NOTE — Assessment & Plan Note (Signed)
Rhonda Ramos is back regarding her chronic back pain.  She switched over to  Opana last visit and felt that the Opana long acting was more beneficial  for pain, but it was causing her to have some nausea and felt it was  affecting her balance, making her a bit loopy.  She did not like the  immediate release as much for breakthrough pain, however.  She is being  clear of the background that was following her around as she seems to be  doing better overall.  She rates her pain a 4-6/10 in average and  described as sharp, intermittent, stabbing, constant, and sometimes  aching.  Pain interferes with general activity, in relationship with  others, and enjoyment of life on a moderate level.  Sleep is fair.   REVIEW OF SYSTEMS:  Notable for no increases in her weight yet.  She is  trying to eat more.  Other pertinent positives are above and full review  is in the written health and history section.   SOCIAL HISTORY:  As noted above.  She still spends occasional time with  her family, but generally is alone.   PHYSICAL EXAMINATION:  VITAL SIGNS:  Weight is 76 pounds, blood pressure  is 111/66, pulse 91, respiratory rate 18, and she is sating 100% on room  air.  GENERAL:  The patient is pleasant, alert, and oriented x3.  She does not  appear much different from a weight standpoint.  She does appear a bit  anorexic.  She walks with a wide-based rotating gait.  She uses a cane  for balance.  Strength is generally stable at 3-4/5 in both legs.  She  has decreased fine motor movement.  HEART:  Regular.  CHEST:  Clear.  ABDOMEN:  Soft and nontender.   ASSESSMENT:  1. Spina bifida, persistent axial and radicular pain in the lower      extremities.  2. Sacroiliac dysfunction.  3. Neurogenic bladder.  4. Post-traumatic stress disorder.  5. Constipation.   PLAN:  1. We will change her back to OxyContin controlled release 60 mg q.8      h., #90, and Roxicodone 15 mg 1-2 q.8 h. p.r.n., #105.  2. We  will see her back in 1 month with Nursing Clinic and 3 months      with me.      Rhonda Ramos, M.D.  Electronically Signed     ZTS/MedQ  D:  03/29/2008 10:59:47  T:  03/29/2008 22:00:04  Job #:  [id number]

## 2010-09-17 NOTE — Assessment & Plan Note (Signed)
Ariely is back regarding her back pain.  I had sent her for an SI joint  injection but Dr. Wynn Banker ran in to some technical difficulty due to  her hypoplastic sacrum.  Incidentally, the patient had a fall and we x-  rayed her pelvis which revealed a hypoplastic sacrum and abnormal SI  joints with consolidation of the sacral segments.  Pain has reduced a  bit since her fall but she is still having pain with some numbness into  the left thigh.  I gave her Medrol Dosepak which she felt was helpful.  She rates her pain as 6/10 to 8/10.  She remains on OxyContin scheduled  for her pain control at 40 mg every 8 hours.  She is also using  Roxicodone 15 mg every 8 hours p.r.n.  The patient does not note any new  specific weakness.  She is walking with her walker now and not using her  crutches anymore.  Sleep has been fair with the Xanax.   REVIEW OF SYSTEMS:  Notable for the above.  Mood has been fair though  she has had some anxiety and depression.   SOCIAL HISTORY:  Patient is widowed, living with her parents.  She is  still smoking a half to 1 pack of cigarettes per day.   PHYSICAL EXAM:  Blood pressure is 110/70.  Pulse is 74.  Respiratory  rate 18.  She is satting 97% on room air.  Patient is pleasant, alert  and oriented x3.  She is a bit restless today and having some problems  with comfort, bouncing up and down in the chair.  The left leg is  somewhat sensitive to touch but not much beyond usual.  She has some  pain in the low back along her surgical scar.  Otherwise, exam and  posture are same.  I saw no specific new neurological changes today.  Heart was regular.  Chest was clear.  Abdomen soft and nontender.   ASSESSMENT:  1. 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  2. Sacroiliac joint dysfunction.  3. Recent fall with exacerbation as above.  4. History of neurogenic bladder.  5. Posttraumatic stress disorder.  6.  History of constipation.   PLAN:  1. Continue OxyContin 40 mg every 8 hours and Roxicodone 15 mg every 8      hours p.r.n.  We will add low-dose Topamax 25 mg h.s. x1 week then      up to 50 mg thereafter.  2. Will write for prednisone taper from 60 mg to off over      approximately 16 days' time.  3. Need to look at the possibility of increasing her long-acting      opiates versus intrathecal pain pump.  4. I will see her back in about 2 months with nurse clinic followup in      1 month's time.  I did encourage her to use her walker going      forward for better balance.      Ranelle Oyster, M.D.  Electronically Signed     ZTS/MedQ  D:  02/19/2007 16:13:41  T:  02/21/2007 13:15:14  Job #:  [id number]
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
## 2010-09-17 NOTE — Assessment & Plan Note (Signed)
Rhonda Ramos is back regarding her spina bifida and associated back pain with a  tethered cord.  Rhonda Ramos is in her own apartment now and having some  problems adjusting physically to some of the management responsibilities  of her household.  She rates her pain as 6/8.  She requests going up on  her breakthrough medication to 30 mg.  She describes the pain as sharp,  burning, stabbing, tingling, and aching ranging from the low to mid back  down to the thigh and knee.  The pain is more pronounced on the left leg  than right.  Bowel and bladder function has been stable.  Mood has been  fair as well.   REVIEW OF SYSTEMS:  Notable for the above.  Full review is in the  written health and history section in the chart.   SOCIAL HISTORY:  Patient has not looked in to any assistive mechanisms  in place for her apartment complex.  She is afraid to ask her mother for  help.   PHYSICAL EXAMINATION:  Blood pressure is 110/48.  Pulse is 104.  Respiratory rate 18.  She is saturating 98% room air.  Patient is pleasant.  Alert and oriented x3.  She was not tearful today.  Gait was essentially stable with her swinging pelvis motion noted with  the use of her canes for support.  Back notable for ongoing surgical scar.  Strength in her lower extremities was ranging from 2 to 3+ out of 5.  HEART:  Regular.  CHEST:  Clear.  COGNITION:  Intact.   ASSESSMENT:  1. Spina bifida with persistent axial and radicular pain in the      setting of a tethered cord syndrome.  2. Sacroiliac dysfunction.  3. Neurogenic bladder.  4. Posttraumatic stress disorder.  5. Constipation.   PLAN:  1. Increase patient's Roxicodone to 1 to 2 q.8 hours p.r.n. 15 mg,      number 105.  2. Continue OxyContin 60 mg q.8 hours.  3. Discussed the possibility of a spinal stimulator in this case.  She      is going to see Neurosurgery at [facility] over the summer, and will      mention it to them.  4. Patient needs to look at some assistance for  her household chores,      as I think these may be a bit much for her from a physical, pain,      and safety standpoint.  5. We will see her back in 3 months with me, and 1 month in the nurse      clinic.      Ranelle Oyster, M.D.  Electronically Signed    ZTS/MedQ  D:  08/06/2007 14:29:36  T:  08/06/2007 15:08:52  Job #:  [id number]   cc:   Dario Guardian, M.D.  Fax: [phone]

## 2010-09-17 NOTE — Assessment & Plan Note (Signed)
Rhonda Ramos is back regarding her chronic pain.  She comes in today distraught.  She states that she has not slept for 3 days.  She is stating that her  life ended when her husband killed himself 3 years ago.  Her Prozac was  stopped last month and apparently she is on Remeron now, although I  cannot verify this.  She saw Dr. Katrinka Blazing last month who made the change.  She has psychiatry followup scheduled apparently later this week.  She  rates her pain 8-9/10.  Pain is notable on the right hand particularly  in the fourth and fifth fingers.  She has pain in her back as well as  her legs, her usual.   REVIEW OF SYSTEMS:  The patient reports the above as well as trouble  walking.  Appetite remains poor though she states that she is trying to  make herself eat.  Weight has not dropped fortunately.  Full review is  in the written health and history section.   SOCIAL HISTORY:  As noted above.  The patient is widowed and has become  more lonely and very reclusive over the last month to 6 weeks.   PHYSICAL EXAMINATION:  VITAL SIGNS:  Blood pressure is 153/95, pulse is  120, respiratory rate 18, and she is sating 97% on room air.  GENERAL:  The patient is alert, very anxious, and appears exhausted.  Weight has not changed appreciably from last visit.  She continues to  walk with a wide-based gait and tends to move from side to side to  alleviate pain in standing.  She rates her pain at 3-4/5 in both legs.  Continues to have decreased fine motor movement of the legs in  particular.  Her right hand is notable for positive Tinel sign at the  right ulnar groove.  HEART:  Regular.  CHEST:  Clear.  ABDOMEN:  Soft, nontender.   ASSESSMENT:  1. Spina bifida with persistent axial radicular pain in the lower      extremities.  2. Sacroiliac joint dysfunction.  3. Neurogenic bladder.  4. Post-traumatic stress disorder.  5. Constipation.  6. Right ulnar nerve entrapment/compression injury.   PLAN:  1. We  will switch off Topamax, which should help appetite slightly and      begin Neurontin 100 mg titrating up to t.i.d. over the next 2      weeks.  2. Recommended elbow pad and avoidance of flexion and compression of      the right ulnar groove particularly when she is sleeping at night.      Apparently, she sleeps with the arm bend and the head over the arm,      which will result her waking up with a numb hand shortly      thereafter.  3. Refill OxyContin 60 mg q.8 h. #90 and oxycodone 15 mg 1-2 q.8 h.      p.r.n. #105.  Our goal is to reduce these significantly this year.  4. Reduce her pain medications.  We need to improve her from a      psychiatric standpoint.  I will discuss her case with Dr. Katrinka Blazing.      The patient needs to also get into see her psychiatrist as soon as      possible and likely would      benefit from some ongoing counseling for depression/PTSD.  5. I will see her back 1 month with nursing and 3 months with me.  Ranelle Oyster, M.D.  Electronically Signed     ZTS/MedQ  D:  06/21/2008 11:13:14  T:  06/22/2008 00:24:06  Job #:  [id number]   cc:   Dario Guardian, M.D.  Fax: [phone]

## 2010-09-17 NOTE — Assessment & Plan Note (Signed)
Rhonda Ramos is back regarding her chronic back pain.  She again comes without  medications today.  She did not bring her bridge medications in.  She  left them in a cab and had a family reunion at [place].  She states  that her cousin is going to mail her pills back to her.  I had already  warrant her at last visit that there would be a in effect a 0  tolerance policy for her.  The patient has worries about her tethered  cord syndrome becoming worse.  She states that she is following some  more tingling and pains in her left foot and perhaps a right.  She rates  her pain 3-10/10.  She last took her medication yesterday evening.  Pain  is sharp, stabbing, aching.  Pain interferes with general activity,  relations with others, enjoyment of life on a severe level.   REVIEW OF SYSTEMS:  Notable for the above as well as bladder and bowel  control issues.  Full review is in the written health and history  section of the chart.   SOCIAL HISTORY:  Noted above.   PHYSICAL EXAMINATION:  VITAL SIGNS:  Blood pressure is 141/72, pulse is  119, respiratory rate is 20.  She is sating 99% on room air.  GENERAL:  The patient is anxious, generally alert.  EXTREMITIES:  The patient continues to walk with her wide-based gait.  She has weakness still in the hips and knees in the range of 3-4/10.  She is a bit weaker with her hip abductors and certainly her ankles are  weak at 1-1+/5.  Sensation is decreased in both legs as well.  Reflexes  are decreased at the knees and ankles.  HEART:  Regular rhythm.  Tachycardiac.  CHEST:  Clear.  ABDOMEN:  Soft, nontender.   ASSESSMENT:  1. Spina bifida with persistent axial and radicular pain in the lower      extremities.  2. Sacroiliac joint dysfunction.  3. Neurogenic bowel and bladder.  4. Posttraumatic stress disorder.  5. History of right ulnar nerve entrapment.   PLAN:  1. The patient was asked to bring in her remaining pills this week      that are being  mailed to her as well as a prescriptions that I am      writing today which are OxyContin 60 mg #82 and oxycodone 15 mg 1-2      q.8 h p.r.n. #95.  I brought in our nurse, office manager to review      our agreement.  Any further discrepancies with her meds counts etc.      will not be tolerating and will result in immediate discharge from      our office.  The patient understands this zero tolerance policy.      She needs to start taking some accountability for her medications      and we have gone above and beyond for her in my opinion.  2. I would like to check a pill count in approximately 2 more weeks to      validate her use of the medications.  She      will see the nurse clinic in about 1 month and then followup with      me in approximately 3 months' time if still in our practice.      Ranelle Oyster, M.D.  Electronically Signed     ZTS/MedQ  D:  12/18/2008 12:39:17  T:  12/19/2008  00:06:15  Job #:  [id number]   cc:   Dario Guardian, M.D.  Fax: [phone]
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
## 2010-09-20 ENCOUNTER — Other Ambulatory Visit: Payer: Self-pay | Admitting: *Deleted

## 2010-09-20 MED ORDER — AMLODIPINE BESYLATE 5 MG PO TABS: 5.0000 mg | ORAL_TABLET | Freq: Every day | ORAL | Status: DC

## 2010-09-20 NOTE — H&P (Signed)
NAMEAMI, Rhonda Ramos NO.:  [id number]   MEDICAL RECORD NO.:  [id number]          PATIENT TYPE:  IPS   LOCATION:  0402                          FACILITY:  [facility]   PHYSICIAN:  Jeanice Lim, M.D. DATE OF BIRTH:  12-05-1965   DATE OF ADMISSION:  11/29/2004  DATE OF DISCHARGE:                         PSYCHIATRIC ADMISSION ASSESSMENT   This is a voluntary admission to the services of Dr. Aleatha Borer.   IDENTIFYING INFORMATION:  This is a 45 year old single white female.  Apparently she was brought to the emergency room for medical clearance.  The  patient had witnessed her fiancee shoot himself in the head.  She was  reporting still seeing her boyfriend's brain in her hands but was requesting  discharge home to be with her parents and to help make the funeral  arrangements.  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  She was admitted to help with crisis stabilization, to adjust  her medication and to help her grieve and learn better ways to cope with her  recent loss.  She gave a history that her boyfriend had been over utilizing  her pain medications.  Today while interviewing her she begins praying and  asking Jesus for help.  She denies auditory hallucinations; however, she  appears to be responding to internal stimuli.   PAST PSYCHIATRIC HISTORY:  She states that at age 76 she was prescribed  Prozac and all the doctors have continued that over the past 20 years.   SOCIAL HISTORY:  She gives 2 years of college.  She is currently on  disability; however, she used to be an Insurance underwriter.   FAMILY HISTORY:  Negative.   ALCOHOL AND DRUG HISTORY:  She acknowledges smoking 1 pack of cigarettes per  day.  Other than that she denies.   PRIMARY CARE Colleena Kurtenbach:  Dr. Merri Brunette.  She follows her for  hypertension.  The patient had spina bifida is status post numerous  surgeries beginning in 1968 for this condition.   ALLERGIES:  She  has a drug allergy to LATEX.   MEDICATIONS:  1.  Morphine sulfate ER 60 mg 1 q.8h. p.r.n.  2.  Oxycodone 15 mg 1-2 q.8h. p.r.n.  These are both prescribed by the pain      physician Dr. Thersa Salt.  3.  Klonopin 1 mg b.i.d.  4.  Lisinopril 20 mg 1 p.o. daily by Dr. Merri Brunette.  5.  Apri 0.5/30 one daily for birth control, and this is by Dr. Jeannetta Nap.   MENTAL STATUS EXAM:  She is alert and oriented.  Her appearance is somewhat  unkempt.  Her speech is pressured at times.  Her mood is labile.  Her  thought processes are clear, rational and goal-oriented.  She wants  discharge.  Judgment and insight are poor at the moment.  Concentration and  memory are intact.  Intelligence is average.  She denies being suicidal or  homicidal.  She denies being of potential harm to herself.  She denies  auditory or visual hallucinations; however, it was my impression that she  is  having some degree of psychosis.   ADMISSION DIAGNOSES:  AXIS I:  Psychotic reaction, witnessed her fiancee's  suicide.  AXIS II:  Deferred.  AXIS III:  Status post spina bifida, asthma and hypertension.  AXIS IV:  Severe.  AXIS V:  10.   PLAN:  Admit for safety and stabilization, to adjust her medications as  indicated.  Toward that end on admission she was ordered Zyprexa 2.5 mg p.o.  q.6h. p.r.n. hallucinations.  She is requesting discharge.  Her parents feel  that they could manage her on the outside.  I think it would be best to wait  until the start of business on Monday as the patient is requesting full new  prescriptions of her pain medications, and Dr. Thersa Salt will not be available  until Monday morning.       MD/MEDQ  D:  11/30/2004  T:  11/30/2004  Job:  [id number]

## 2010-09-20 NOTE — Discharge Summary (Signed)
NAMEBETSY, Ramos NO.:  [id number]   MEDICAL RECORD NO.:  [id number]          PATIENT TYPE:  IPS   LOCATION:  0504                          [facility]ILITY:  [facility]   PHYSICIAN:  Jeanice Lim, M.D. DATE OF BIRTH:  Feb 13, 1966   DATE OF ADMISSION:  11/29/2004  DATE OF DISCHARGE:  12/01/2004                                 DISCHARGE SUMMARY   IDENTIFYING DATA:  This is a 45 year old single Caucasian female brought to  the emergency room for medical clearance.  Witnessed fiance' shoot himself  in the head.  Reports still seeing brains in her hands.  Requested discharge  to go home, in turn saying can't you see the brains, appearing to be  acutely psychotic, likely acute stress reaction, but impaired reality  testing and questionable unpredictableness.  The patient had possible  substance abuse history, and it was unclear regarding the patient's grief  reaction versus suicidal thoughts and began praying, asking Jesus for help.  Appeared to be responding to internal stimulus.   PAST PSYCHIATRIC HISTORY:  States that at age 6, was prescribed Prozac and  the doctors continued this for the past 20 years.  Acknowledged smoking one  pack per day.  Denied other substance use, but there is question of opiate  abuse and the family had been concerned about this and was not aware that  she was still on opiates.   MEDICATIONS:  Morphine sulfate ER 60 mg q.8 h., and sometimes a higher dose;  oxycodone 15 mg 1-2 q.8 h. p.r.n. up to 30 mg twice a day; Klonopin 1 mg  b.i.d. up to q.i.d.; lisinopril 20 mg daily; __________ 0.5/30 one daily  birth control by Dr. Jeannetta Nap.   ALLERGIES:  LATEX.   MENTAL STATUS EXAM:  Alert and oriented, somewhat unkempt, pressured.  Mood,  labile.  Thought processes goal directed, felt that she did not need to be  here, was not aware that she was not coming into the medical hospital.  Felt  she had been promised in front of chaplain and family that she  would be here  for less than 1 day and then discharged.  Cognition was grossly intact.  It  was unclear whether the patient was a potential harm for self, and reality  testing did not appear to be intact.  She was also quite labile, undergoing  very serious catastrophic stress.   ADMISSION DIAGNOSES:  AXIS I:  Likely having an acute stress reaction on top  of whatever her baseline pathology is, which is possible major depression  and polysubstance abuse versus history of opiate dependence, in part  iatrogenic, with history of abuse of opiates as per family.  AXIS II:  Deferred.  AXIS III:  Status post spina bifida, asthma, and hypertension, in a  wheelchair.  AXIS IV:  Severe.  AXIS V:  20/60.   HOSPITAL COURSE:  The patient was admitted and ordered routine p.r.n.  medications, underwent further monitoring, and was encouraged to participate  in individual, group and milieu therapy.  Zyprexa was ordered p.r.n. for  hallucinations.  The patient was requesting discharge.  Parents were also  feeling that they could manage her on the outside.  Father had history of  combat in [place] and had PTSD and was aware of what kind of issues could  arise; however, when finding that she had been on all of these pain  medications and unclear about her commitment to being compliant with  followup plan, family also seemed uneasy, although still requesting patient  to be discharged.  The patient was monitored and was moved off of the 400  hall.  Family meeting was held to review safety issues and the patient was  discharged in improved condition, understanding the importance of grief  process and getting therapy, in addition to improving medication management  of her current symptoms, importance of sleep and taking care of herself, and  then being around family who could support her.  Parents agreed to allow the  patient to live with them briefly, as long as the patient followed their  rules.  The  patient was given medication education, as well as family, and  then discharged to family to monitor pain medications.  They would hold onto  these and master these since the patient would be at risk of possibly doing  something impulsive, as well as has a history of questionable abuse, and it  was unclear whether boyfriend who had killed himself also had been abusing  her pain medications or abusing opiates in general.   The patient was discharged on:  1.  Zyprexa 2.5 mg q.8 p.m. for one week and then possibly stop, depending      on followup evaluation.  2.  Klonopin 1 mg b.i.d. as previously prescribed, with a recommendation to      gradually titrate off of this once more stable.  3.  Ditropan 5 mg q.a.m.  4.  Prinivil 20 mg q.a.m.  5.  Prozac 20 mg 2 q.a.m. as previously prescribed.  6.  Ambien 10 mg q.h.s. p.r.n. insomnia.  7.  MS Contin as previously prescribed.  8.  Roxicodone as previously prescribed.  The patient was given a brief      supply since the patient's boyfriend apparently had overdosed on this,      although again the patient's reliability regarding the opiates was      questionable due to conflicting reports.   DISPOSITION:  The patient was to follow up with the pain clinic with Dr.  Genevieve Norlander and the patient agreed to sign release of information for family to  communicate with doctor regarding their concerns so that there could be an  eventual plan for her to be gradually tapered off of opiate medications, if  this is medically indicated.  The patient again was discharged in improved  condition with no acute dangerous ideation.  Mood was more euthymic, more  calm, less labile, with improved judgment, insight and coping skills.  As  well, family was quite supportive and would supervise the patient as well as  supervise the safety of her medications.  She is discharged in improved  condition.  DISCHARGE DIAGNOSES:  AXIS I:  Likely having an acute stress reaction on  top  of whatever her baseline pathology is, which is possible major depression  and polysubstance abuse versus history of opiate dependence, in part  iatrogenic, with history of abuse of opiates as per family.  AXIS II:  Deferred.  AXIS III:  Status post spina bifida, asthma, and hypertension, in a  wheelchair.  AXIS IV:  Severe.  AXIS V:  Global assessment of functioning on discharge was 55.      Jeanice Lim, M.D.  Electronically Signed     JEM/MEDQ  D:  01/08/2005  T:  01/08/2005  Job:  [id number]

## 2010-09-20 NOTE — Assessment & Plan Note (Signed)
Rhonda Ramos is back regarding her low back pain.  She had an MRI performed on  June 06, 2005, by her primary doctor.  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  Facet overgrowth is also noted in the lumbar spine due to arthropathy which  had progressed from last exam in 2002.  Patient rates her pain a 6/10.  She  describes it as sharp, burning and constant aching.  Pain is primarily in  the lower lumbar spine and over the hips.  Pain interferes with general  activity, relations with others and enjoyment of life on a moderate level.  Sleep is fair.  Pain increases with activity.  She still is using MS Contin  60 mg q.8h. and Roxicodone 15 mg q.12h. p.r.n.  She battles over the  medications with her mom apparently on a frequent basis.   REVIEW OF SYSTEMS:  Patient reports trouble walking, spasms, depression and  anxiety.  She is working on her weight and has begun physical therapy to  improve lower extremity strengthening.   PHYSICAL EXAMINATION:  GENERAL APPEARANCE:  Patient is pleasant, in no acute  distress.  She is alert and oriented x3.  Affect is generally bright and  appropriate.  VITAL SIGNS:  Blood pressure 127/63, pulse is 103, respiratory rate 16, she  is sating 97% on room air.  LUNGS:  Clear.  CARDIOVASCULAR:  Regular rate and rhythm.  ABDOMEN:  Soft and nontender.  NEUROLOGIC:  Gait is shuffling and she is flexed in posture at the hips.  Coordination is fair. Reflexes are decreased in the lower extremities.  Sensation is decreased and sporadic as well.  She continues to stand with a  flexed posture even when cued to stand upright, although she is able to  improve her posture somewhat.  She has a dextroscoliosis and rotation of the  lumbar thoracic spine as well.  She has pain with palpation over both PSIS  areas.  Compression tests were positive as well as Patrick's test today.  Facet maneuver was equivocal.  Straight leg testing was positive.   ASSESSMENT:  1.  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  2.  Patient may have component of her pain related to facet arthropathy      versus sacroiliac joint  dysfunction.  Based on our examination today, I      would lean more towards sacroiliac joint  dysfunction.  Of course these      two may overlap in presentation.  3.  Hypertension.  4.  Urinary retention and constipation.  5.  Bilateral greater trochanter bursitis.   PLAN:  1.  Patient was hoping to switch to a longer acting agent such as ones we      have discussed in the past to better control her baseline pain and with      the hopes of getting off her oxycodone.  We selected Avinza 90 mg q.12h.      I did given her Roxicodone 15 mg still to control breakthrough symptoms      if she needs them.  2.  I am fully in support of her therapy to work on her strengthening and      weight loss.  I am not sure how much she would benefit from surgery to      her back as she has had three of these surgeries already and I do not      see her strength  loss as progressive.  3.  I will ask the patient to consider injections into the sacroiliac joints      as I feel these could significantly benefit      her.  I also could consider facet joint depending on the technical      difficulty there.  4.  I will see the patient back in about one month's time.      Ranelle Oyster, M.D.  Electronically Signed     ZTS/MedQ  D:  07/30/2005 12:47:34  T:  07/31/2005 17:14:15  Job #:  [id number]   cc:   Dario Guardian, M.D.  Fax: [phone]

## 2010-09-20 NOTE — Assessment & Plan Note (Signed)
Rhonda Ramos is back regarding her chronic pain related to spina bifida and  tethered cord syndrome.  She has been generally stable from a pain  standpoint with her medications.  The MS-Contin allows her adequate  mobility with the Roxicodone for breakthrough pain.  She is having  symptoms of a urinary tract infection.  She usually acquires one of  these once or twice a year.  She has to self-catheterize herself to  empty.  She sees urology at [facility] yearly for followup.  The  patient rates her pain at a 5-7 out of 10 today, and describes the  aching as sharp.  The pain interferes with general activity, relations  with others, and enjoyment of life from a moderate level.  The patient  walks with a pain for support.   REVIEW OF SYSTEMS:  Positive for dysuria with foul-smelling odor.  She  has noticed some sediment in her urine as well.  Denies any other new  neurological, psychiatric, constitutional, GU, GI, cardiovascular,  respiratory complaints.   SOCIAL HISTORY:  Is stable.  She lives with her parents, although their  relationship appears stressed still over her use of narcotic analgesia.   PHYSICAL EXAMINATION:  Blood pressure is 132/76.  Pulse is 118.  Respiratory rate is 16.  She is satting 9% on room air.  The patient is  pleasant, in no acute distress.  The patient is alert and oriented x3.  Affect is bright, generally appropriate.  HEART:  Tachycardic.  CHEST:  Clear.  ABDOMEN:  Soft, nontender.  No significant postural changes today.  She walks with a wide-based,  circumducting gait.  Both hips are externally rotated.  Motor function  generally is 5/5 in the upper extremities, and 3 to 4/5 in the lower  extremities today.  Sensory exam is 1/2, and reflexes 1+.   ASSESSMENT:  1. Chronic low back related to spina bifida and tethered cord      syndrome.  2. Facet arthropathy and sacroiliac joint pain.  3. Hypertension.  4. Neurogenic bladder with likely a urinary tract  infection.  5. Bilateral greater trochanteric bursitis.  6. History of posttraumatic stress disorder.  7. History of constipation.   PLAN:  1. Began Cipro 500 mg b.i.d.  check UA/C&S.  2. Refill MS-Contin 60 mg q.8h., #90, and Roxicodone 15 mg b.i.d.,      #60.  3. I will see the patient back in three months' time.  I will call her      if her Cipro is not sensitive to the urine culture.      Ranelle Oyster, M.D.  Electronically Signed    ZTS/MedQ  D:  05/08/2006 10:41:26  T:  05/08/2006 11:18:29  Job #:  [id number]   cc:   Dario Guardian, M.D.  Fax: [phone]

## 2010-09-20 NOTE — Assessment & Plan Note (Signed)
DATE OF VISIT:  January 01, 2005   MEDICAL RECORD NUMBER:  [id number]   Rhonda Ramos is back regarding her chronic pain syndrome. She is still going through  a lot of emotional problems that is related to the death of her fiance. She  has been receiving psychological support through hospice. She will begin  regular psychiatric care in the next week or so. She ran out of her  medicines a day or two ago and her pain has increased and she has been  having problems as a result of that in the context of her emotional issues.  She rates her pain at a 9/10 without the medications and then a 3/10 with  the medications. She has generally been under fair control. She is trying to  finds ways to pay for her medications and seems to have come up with a  solution for a more reasonable co-pay. The pain is most prominent in the  legs and low back. The legs had a sharp, tingling quality. The back is more  stabbing, constant pain. Pain interferes with general activities, relations  with others, and enjoyment of life on a 6/10 level. Sleep is fair when she  uses the Ambien.   REVIEW OF SYSTEMS:  The patient reports bowel and bladder control problems,  weakness, trouble walking. She uses a rolling walker still and this has been  chronic. Her mood has been appropriately depressed and anxious. Reports  occasional night sweats. Denies any new gastrointestinal or urinary  difficulties, cardiorespiratory problems, etc.   SOCIAL HISTORY:  Pertinent positives listed above. Her family is helping her  with medications and with emotional support.   PHYSICAL EXAMINATION:  Blood pressure is 130/70, pulse is 125 with recheck  around 100-110. Respiratory rate is 16, she is saturating 96% in room air.  The patient was very tearful and emotional today. She could be redirected.  She is very anxious overall. She is alert and oriented x3. She walks with  her walker today and with a limp. She was steady with the walker. Motor and  sensory exam without significant change. Back remains tender. Reflexes were  decreased at the knees and ankles today. Sensory exam was 0-1/2 on the legs.  Heart is regular rhythm. Chest is clear.   ASSESSMENT:  35.  A 45 year old white female with spina bifida and tethered cord syndrome.  2.  Anxiety and reactive depression.  3.  Hypertension.  4.  Urinary retention and constipation.  5.  Bilateral greater trochanter bursitis.   PLAN:  1.  Continue with grief counseling and the patient is in need of prompt      psychiatric follow-up. I asked her psychiatrist to send me information      regarding her care. She probably would benefit from another mood      stabilizer or anxiolytic but I will leave this up to him, and then we      can work within the context of her medications for mood later on.  2.  Generally she is doing well with the MS Contin. Need to check on her      scheduling of this, as we have been using q.8h. MS Contin in the past.      She has it written on her notes that she is taking it q.12h. Will      confirm with the patient. Use oxycodone 15 mg q.12h. p.r.n. for      breakthrough symptoms.  3.  Continue with Prozac for  mood. I would like to switch to Cymbalta to      help with some of her dysesthesias.  4.  Consider Topamax at bedtime as we have attempted this in the past.  5.  Consider Duragesic patch.  6.  I will see her back in about 1 month here in the office.      Ranelle Oyster, M.D.  Electronically Signed     ZTS/MedQ  D:  01/01/2005 11:00:56  T:  01/01/2005 11:50:04  Job #:  [id number]   cc:   Dario Guardian, M.D.  Fax: [phone]

## 2010-09-20 NOTE — Assessment & Plan Note (Signed)
Rhonda Ramos is back regarding her chronic low back pain in the context of her  spina bifida.  She states that over the last 2 months her pain seems to  be worsening.  It centers at the curve of her spine as well as the  lumbar and the suprapelvic areas.  She has a lot of pain when she stands  for a long period of time as well as walks.  She will get tight when she  sits for prolonged periods.  She rates her pain as a 6 out of 10 on  average and states today it is 8 out of 10.  She describes pain as  sharp, intermittent, stabbing, aching.  Pain interferes with general  activity, relation with others, enjoyment of life on a moderate to  severe level.  She had recent colposcopy which was negative for any  abnormal growth.  The patient remains on MS Contin 60 mg q. 8 hours and  Roxicodone 15 mg q. 12 hours p.r.n.  She was placed on steroid recently  for eczema and developed significant bruising in her legs.   REVIEW OF SYSTEMS:  Is positive for the above.  Full review is in the  written health and history section.   SOCIAL HISTORY:  Is without significant change  She still lives with her  parents.   PHYSICAL EXAMINATION:  Blood pressure is 110/71, pulse 87, respirations  16.  She is sating 98% on room air.  The patient is pleasant, alert and oriented x3.  Affect is generally  bright and appropriate.  She stood to walk and lost her balance today.  She generally uses her walker to support her when she is moving.  She  stands with a leftward bend.  She still has a significant curve at the  T8-T9 juncture with apex to the left.  There is also clockwise rotation  of the lumbar spine and pelvis up about 5 to 10 degrees.  She is tender  with palpation over the upper iliac areas and potentially the sacroiliac  joints.  Facets are generally tender.  She also has tenderness at the T8-  T9 juncture.  Strength is generally stable.  I rated her strength in the  lower extremities a 3 to 4 out of 5 today.   Reflexes are 1+ and sensory  exam is 1 out of 2.   ASSESSMENT:  1. Chronic back pain related to spina bifida and tethered cord      syndrome with overriding spondylosis as well. The patient has      documented facet overgrowth at multi-levels in the lumbar spine.  2. Sacroiliac joint pain.  3. Hypertension.  4. Neurogenic bladder.  5. History of greater trochanter bursitis.  6. History of  post traumatic stress disorder.  7. History of constipation.   PLAN:  1. We will switch patient to OxyContin 40 mg q. 8 hours for baseline      pain control.  Continue Roxicodone 15 mg q. 12 hours p.r.n.  2. The patient should consider interventional blocks such as      sacroiliac joint blocks or facet blocks.  She is very      weary of injections and has a lot of apprehension over these.  3. I will have the patient followup in the Nurse Clinic in 1 month.      She will see me in 3 months' time.      Ranelle Oyster, M.D.  Electronically Signed  ZTS/MedQ  D:  08/26/2006 10:04:01  T:  08/26/2006 10:30:42  Job #:  [id number]   cc:   Dario Guardian, M.D.  Fax: [phone]

## 2010-09-20 NOTE — Group Therapy Note (Signed)
CHIEF COMPLAINT:  Low back and leg pain.   REFERRED BY:  Dario Guardian, M.D.   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 The patient initially had triple  arthrodesis surgeries of both feet which was performed in 1986. The patient  states her last surgery for lysis of adhesions related to her tethered cord  was in 2002. She states after that surgery, her pain has progressively  increased. She has been on multiple medications over the years including  Duragesic patches, Lidoderm, multiple muscle relaxants, Neurontin, Elavil,  etc.  She is followed by Dr. Farris Has and most recently an orthopedic surgeon  for pain. Dr. Merri Brunette has been helping her out over the last two weeks  regarding her pain medications. The decision was made with her ongoing pain  problems to refer her for chronic pain management at our office. The patient  states that she is very limited in her mobility at this point. She has a  manual chair, a walker, a cane, and motorized chair. She tries to stay  active on her feet but is limited due to pain in the back and in the legs.  The patient can climb steps on a very limited basis. She states she is still  able to drive.   Current medications include MS Contin 50 mg q.12 h which was recently  increased in January of this year. She has been taking Roxicodone 15-30 mg  for breakthrough pain, 2-3 times a day. She was recently increased to 30 mg  dose which seems to help her more. She is using Klonopin 1 mg b.i.d. for  anxiety as well as Prozac 40 mg q.d. The patient is on oxybutynin for her  bladder 5 mg q.d., lisinopril 1 pill daily for blood pressure.   The patient rates her pain on average as an 8/10. She describes her pain as  intermittent, sharp, dull, tingling, aching, burning, etc. Her neck has been  doing fairly well  regarding its pain although she does still have some pain  into the shoulders and between the scapulae. The pain is persistent  throughout the day. Her sleep is fair to poor. She gets about 5-6 hours a  night on average. The pain increases with walking, bending, sitting and  activity, standing, and general activity. The pain improves with rest, heat  and ice, medication and TENS. The pain interferes with general activity,  relations with others and enjoyment of life on a significant level.   PAST MEDICAL HISTORY:  Positive for hypertension, spina bifida, anxiety,  kidney disease.   MEDICATIONS:  Listed above in the HPI.   ALLERGIES:  CODEINE, LASIX, ERYTHROMYCIN and ULTRAM.   REVIEW OF SYMPTOMS:  The patient reports constipation. She intermittently  catheterizes herself. She does report occasional spasms, depression, weight  gain, gastritis symptoms.   FAMILY HISTORY:  Positive for heart disease, diabetes, high blood pressure,  alcohol abuse.   SOCIAL HISTORY:  The patient is single but has a fiance' who is very  supportive. She does smoke a 1/2 pack per day. She does not drink. She had  to quit her job a few years ago, in fact in 2000, due to her pain.   PHYSICAL EXAMINATION:  VITAL SIGNS:  The patient's blood pressure is 133/82,  pulse is 118, respiratory rate 16, she is sating 99% on room air.  GENERAL:  The patient has a small build and hunched posture. She is fairly  well kept. She is alert and oriented x3. She is very anxious and in mild  distress. The patient is limited with her gait due to instability. She walks  with a steppage type pattern on the left. The right leg is better supported.  HEART:  Tachycardic but regular rhythm.  CHEST:  Clear.  ABDOMEN:  Soft, nontender.  NECK/BACK:  Essentially cervical to thoracic scar as well as a thoracic  lumbar scar from her prior surgeries. She was very tender around the upper  lumbar lower thoracic region and just proximal to  this area today. Multiple  scar tissue and spasm was noted. Neck range of motion was limited but less  pain was provoked with palpation in the cervical spine today. She  essentially has an S curve with the left curve being in the mid thoracic and  the right curve is in the cervical and lumbar spine.  NEUROLOGIC:  The patient has decreased sensation at 1/2 on the right leg, 0-  1/2 on the left leg.  Reflexes are decreased at the knees and ankles. Upper  examination strength is 5/5 with normal reflexes and sensation today. Muscle  strength in the lower extremities was 4/5 at the right hip, 1/5 left hip,  2/5 at the adductor muscles of the hips. 3+ to 4/5 at the right knee, 2/5  left knee, 3+/5 ankle dorsiflexion on the right, 0/5 ankle plantar flexion,  0/5 ankle dorsiflexion and plantar flexion on the left. Cognitively the  patient was appropriate today. Cranial nerve exam was benign. No excessive  tone or spasm was noted today on exam.   ASSESSMENT:  92.  A 45 year old with spina bifida status post multiple fixation and      tethered cord surgeries now with chronic back pain and dysesthesias      related to her neurological injury.  2.  Anxiety/depression.  3.  Hypertension.  4.  Urine retention and constipation.  5.  Bilateral greater troch bursitis   PLAN:  1.  The patient seems to be doing better with the increased oxycodone      although I would prefer not to dose her oxycodone at such a high dose if      this is in fact going to be a breakthrough medication. I think she would      be better off switching to more MS Contin at a q.8 h dose. Will increase      her MS Contin to 60 mg q.8 h. I will provide her 15 mg of oxycodone q.12      h p.r.n. for breakthrough pain.  2.  The patient is to complete a pain medication diary. We discussed the      risks and benefits of said medications. 3.  Will start the patient on Topamax 25 mg q.h.s. increasing to 50 mg after      one weeks  time.  4.  Will change her over from Prozac to Cymbalta to better assist her with      her neuropathic pain features.  5.  We gave the patient 60 mg Toradol IM shot today, this is with the      patient currently as she seemed to be in fair distress particularly at      the end of the exam today.  6.  Consider injecting bilateral hips which were suspicious for trochanteric  bursitis.  7.  I will see the patient back in one month's time.       ZTS/MedQ  D:  10/16/2004 10:46:17  T:  10/16/2004 11:19:17  Job #:  [id number]   cc:   Dario Guardian, M.D.  [address]  [place]  [place] [address]  Fax: [phone]

## 2010-09-20 NOTE — Assessment & Plan Note (Signed)
Rhonda Ramos is back regarding her spina bifida associated with chronic low back  pain.   We had to switch Rhonda Ramos back to the MS Contin due to financial constraints.  She has apparently hit the hole in her Medicare coverage.  She is taking  Roxicodone 15 mg q.12 hours p.r.n. for breakthrough symptoms.  She has had a  lot of problems emotionally and still has not been able to get by the death  of her fiance.  She has frequent flashbacks and significant depression.  The  anniversary of the date is nearing.  She had questions about whether she  should go on a trip to [place] this fall and she has concerns about her  endurance.   The patient rates her pain at a 6 out of 10.  She describes it as sharp,  dull, stabbing, constant, and aching.  Sleep is stable.  Pain increases with  activity.  She can walk about 3-5 minutes without having to stop.  She uses  a wheelchair for long distances.  She uses her straight cane around the  house.   REVIEW OF SYSTEMS:  Pertinent positives listed above.  Full review is in the  health and history section.   SOCIAL HISTORY:  Without significant change today.  Pertinent positives are  above.   PHYSICAL EXAMINATION:  VITAL SIGNS:  Blood pressure 131/71, pulse 120,  respiratory rate 16, saturating 97% on room air.  GENERAL:  The patient is pleasant, in no acute distress.  She was tearful at  times when reflecting on her fiance.  Coordination was fair.  The patient  had good strength in the lower extremities except for the ankles which  remained flaccid.  She ambulated without her cane today and walked with a  wide-based, externally rotated posture with arms flailing for balance.  She  was at risk for falling without her cane.  Motor function in the upper  extremities is 5/5.  Cognitively she is generally intact.  Reflexes are  decreased in both lower extremities.  Sensory examination is also affected  somewhat.   ASSESSMENT:  1.  Chronic low back pain related  to spina bifida and tethered cord      syndrome.  2.  Facet arthropathy and sacroiliac joint pain.  3.  Hypertension.  4.  Urinary retention and constipation.  5.  Bilateral greater trochanter bursitis.  6.  Post traumatic stress symptoms.   PLAN:  1.  Increase Prozac to 60 mg daily.  I have urged her to follow up with her      psychiatrist.  She will remain on Klonopin as well.  2.  Continue her Roxicodone at 15 mg one q.12 hours p.r.n. #60 and MS Contin      50 mg one q.8 hours schedule #90.  3.  Encouraged her trip to [place] this fall.  4.  I will see her back in about 3 months' time.  She will see the      ________RN__ clinic in 1 month.      Ranelle Oyster, M.D.  Electronically Signed     ZTS/MedQ  D:  11/17/2005 16:12:46  T:  11/18/2005 00:03:38  Job #:  [id number]   cc:   Dario Guardian, M.D.  Fax: [phone]

## 2010-09-20 NOTE — Assessment & Plan Note (Signed)
MEDICAL RECORD NUMBER:  [id number].   Rhonda Ramos is back regarding her chronic back pain and tethered cord syndrome. She  seems to be evening out emotionally. She is still having some moments of  depression and anxiety over the death of her fiance. She still lives with  her parents. Her mother is assisting her with her medications still. She is  taking MS Contin 60 mg q.8h. scheduled. She is taking 15 mg of Roxicodone 1  to 2 in the morning and usually 1 in the afternoon between the MS Contin  doses. Her pain on average is a 5/10. It is a bit higher today with the  cold, damp weather. She describes her pain as intermittent, sharp, stabbing,  tingling, and aching. It interferes with general activity, relations with  others, enjoyment of life on a moderate level. She had some numbness over  the left leg most recently but no significant pain. Sleep has been poor at  times. She is still receiving psychological support regarding the incident  over the summer.   REVIEW OF SYSTEMS:  The patient denies any neurological, psychiatric,  constitutional, GU, GI, or cardiorespiratory complaints today. The patient  does note some increase in her appetite.   SOCIAL HISTORY:  Pertinent positives are listed above. She has quite  smoking.   PHYSICAL EXAMINATION:  Blood pressure 121/68, pulse 114, respiratory rate  16. She is saturating 97% on room air. The patient is pleasant in no acute  distress. She seems to be fair from an emotional standpoint. She is very  bright and cheerful today. She gets a little down hearted when talking about  her fiance but seemed to recover quickly. The patient had good strength in  both legs with 5/5 strength in the right leg and 4/5 strength in the left.  There was some decreased sensation over the anterior lateral thigh on the  left side. There was some decreased sensation in the bilateral distal lower  extremities at 1/2.  HEART:  Regular rate and rhythm.  LUNGS:  Clear.  Cognitively, the patient is appropriate.   ASSESSMENT:  1.  This is a 45 year old white female with spina bifida and tethered cord      syndrome.  2.  Anxiety and reactive depression/post-traumatic stress disorder.  3.  Hypertension.  4.  Urinary retention and constipation.  5.  Bilateral greater trochanter bursitis.   PLAN:  1.  We discussed at length regarding changing up her pain medication      regimen. She seems to be doing fairly well at almost the current      schedule. We will try to reduce some of her breakthrough Roxicodone to      where she can only take 70 tablets max for the month. That way, she may      use an occasional extra Roxicodone in the morning but otherwise will use      two tablets daily for breakthrough pain. Will keep MS Contin at 60 mg      q.8h. scheduled.  2.  Recommend ongoing psychological support.  3.  Recommend exercise and dietary maintenance.  4.  I will see her back in three months' time. She will see our R.N. clinic      in one month.      Ranelle Oyster, M.D.  Electronically Signed     ZTS/MedQ  D:  03/11/2005 13:49:21  T:  03/11/2005 16:18:00  Job #:  [id number]   cc:   Dario Guardian,  M.D.  Fax: [phone]

## 2010-09-20 NOTE — Assessment & Plan Note (Signed)
Rhonda Ramos is back regarding her chronic back pain and tethered cord syndrome.  She is stabilized a bit emotionally, although she is still receiving  psychological support through the hospice services and psychiatric follow-up  through Dr. Evelene Ramos.  Apparently she and her mother are having issues with her  medications.  Her mother is afraid of her becoming addicted and trying to  push her off of the oxycodone, saying that she does not really need this.  Rhonda Ramos tells me that she apparently flushed some of her medication down the  toilet in efforts to keep her from using it.  Her mother stated to me in the  office today that she does not want her to be addicted and is afraid of the  same thing happening to her as it did to her fiance, which she felt was  related to his drug use and addiction.  Rhonda Ramos said to me in privacy that she  still feels that she needs the oxycodone for breakthrough pain.  She rates  her pain overall at a 4/10.  Pain is in the neck, back, low back and  interscapular regions.  Pain is sharp, burning, intermittent, dull,  stabbing, tingling and aching.  Sleep is fair.  Pain increases generally  with activity, standing and sitting for prolonged periods of time.   MEDICATIONS:  1.  MS-Contin 60 mg q.8h. scheduled.  2.  Roxicodone 15 mg q.12h. p.r.n.   Additionally, she is taking:  1.  Klonopin 1 mg at bedtime and, I believe, in the daytime as well.  2.  Ambien 10 mg q.h.s. p.r.n.  3.  Prozac 40 mg a day.  4.  She has come off the Zyprexa.   REVIEW OF SYSTEMS:  Without new changes.  A full review is in the health and  history section of the chart.   SOCIAL HISTORY:  Pertinent positives listed above.   PHYSICAL EXAMINATION:  VITAL SIGNS:  Blood pressure is 134/68, pulse 114,  respiratory rate 16.  She is saturating 97% in room air.  GENERAL:  The patient is pleasant and a bit anxious, especially with her  mother in the room, but generally alert.  MUSCULOSKELETAL/NEUROLOGIC:   She has fair strength at 5/5 in the right leg,  4/5 in the left leg.  Upper extremity strength is 4+ to 5/5.  Sensation was  increased in the lower extremities.  Reflexes were decreased in both legs  today.  Posture remains poor due to scoliosis and chronic changes.  She has  some tightness in her cervical musculature as well with palpation that  reproduces head symptoms.  CARDIAC:  Heart reveals regular rhythm.  CHEST:  Clear.  PSYCHIATRIC:  Cognitively, the patient was appropriate.   ASSESSMENT:  1.  Spina bifida and tethered cord syndrome.  2.  Anxiety and reactive depression/post-traumatic stress disorder.  3.  Hypertension.  4.  Urinary retention and constipation.  5.  Bilateral greater trochanter bursitis.   PLAN:  1.  I refilled her MS-Contin 60 mg q.8h.  We had a discussion that it was      okay to use Roxicodone for breakthrough pain.  Her mother was not      convinced, and Rhonda Ramos agreed at least for the short term to go without      this and see how she does.  I encouraged other modalities, including      ice, heat, stretching, which she does not do on a routine basis.  She  needs to work on her exercise as well.  She may also try Tylenol and      ibuprofen for breakthrough symptoms.  2.  The patient needs ongoing psychiatric and psychological follow-up.  3.  I will see the patient back in three months' time.  The patient will see      the R.N. clinic in one month's time.      Ranelle Oyster, M.D.  Electronically Signed     ZTS/MedQ  D:  06/04/2005 10:30:46  T:  06/04/2005 12:37:34  Job #:  [id number]   cc:   Dario Guardian, M.D.  Fax: [phone]

## 2010-09-20 NOTE — Assessment & Plan Note (Signed)
Rhonda Ramos is back regarding her back pain and spina bifida.  She had a successful  trip to [facility] and did not have any increases in her pain.  She  had more emotional stress there from family issues than anything else.  Her  pain today is generally controlled, it is 2-3 out of 10.  We discussed her  exercise regimen and she is getting a little bit of walking in and doing  some exercises with the [employer]ra-Band but really, that is about it.  She  describes her pain as sharp, intermittent stabbing.  Her pain interferes  with general activity, her relations with others, and enjoyment of  life on  a moderate level.   Include MS Contin 60 mg q.8h. and Roxicodone 50 mg 1 q.8h. p.r.n.   Review of systems is positive for depression.  She denies any other new  neurological, psychological, constitutional, GI, GU, or cardiorespiratory  complaints today.   SOCIAL HISTORY:  Pertinent positives as listed above.   PHYSICAL EXAMINATION:  VITAL SIGNS:  Blood pressure 115/68, pulse 104, respiratory rate 17, she is  saturating 98% on room air.  GENERAL:  The patient is pleasant in no acute distress.  She is alert and  oriented x3.  HEART:  Regular rhythm, slightly tachycardic.  LUNGS:  Clear.  ABDOMEN:  Soft, nontender.  Her back is stable with postural changes noted on prior exams.  She  ambulates with a wide base gait with legs externally rotated.  She needs her  cane for balance.  Motor function is 5/5 in the upper extremities.  She is 3-  4/5 in the lower extremities.  Sensation is 1/2 in the legs.  Reflexes are  1+ in the lower extremities.  Cognitively, she is intact and mood was stable  today.   ASSESSMENT:  1. Chronic low back pain related to spina bifida and tethered cord      syndrome.  2. Facet arthropathy and sacroiliac joint pain.  3. Hypertension.  4. Urinary retention and constipation.  5. Bilateral greater trochanter bursitis.  6. Post traumatic stress disorder.   PLAN:  1.  Continue Prozac 60 mg daily, Klonopin we will continue, as well, per      her psychiatrist.  2. Refill MS Contin, 60 mg q.8h., #90, and Roxicodone 50 mg one p.o.      t.i.d. p.r.n., the goal will be to taper her p.r.n. Roxicodone back to      60 a month over the next month or two time.  3. I  will see the patient back in three months time.  She will see the      nurse clinic in one month.      Ranelle Oyster, M.D.  Electronically Signed    ZTS/MedQ  D:  02/09/2006 13:29:53  T:  02/10/2006 16:08:08  Job #:  [id number]   cc:   Dario Guardian, M.D.  Fax: [phone]

## 2010-09-20 NOTE — Assessment & Plan Note (Signed)
Rhonda Ramos is back regarding her chronic low back pain as related to her spina  bifida and tethered cord syndrome.  She has actually been doing a bit better  with the Avinza 90 mg q.12h.  She does note an overall significant decrease  in her pain but she is having better continual pain relief.  She reports her  pain at a 5/10 and still describes pain in the mid back as well as in the  hips.  Pain is described as sharp, constantly aching.  Pain interferes with  general activity, relations with others and enjoyment of life on a moderate  level.  Sleep is fair.  Still having some problems emotionally with her  fiance's death.  Patient is using Roxicodone 15 mg q.12h. p.r.n.  She is  able to use only 40 since I last saw her at the end of March.   REVIEW OF SYSTEMS:  Patient reports some bladder control problems, weakness  and trouble walking, depression, anxiety, weight gain, and night sweats.  Other pertinent positives are listed above and are in the health and history  section of the chart.   SOCIAL HISTORY:  Without significant change.  Other pertinent positives  listed above.   PHYSICAL EXAMINATION:  GENERAL APPEARANCE:  Patient is pleasant and in no  acute distress.  She is not overly anxious today.  VITAL SIGNS:  Blood pressure is 112/67, pulse 114, respiratory rate 17, she  is sating 98% on room air.  NEUROLOGIC:  Coordination is fair.  Reflexes are 2+.  Sensation is generally  stable.  She has fair lumbar range of motion although she is weak with  forward flexion still.  Low back is slightly tender to palpation along the  PSIS areas, lumbar and thoracic paraspinal muscles.  She has positive  compression and Patrick's test today.  Facet maneuver was equivocal.  Straight leg testing was equivocally positive with hamstring tightness  noted.   ASSESSMENT:  1.  Chronic low back pain associated with spina bifida and tethered cord      syndrome.  2.  Questionable facet arthropathy and  sacroiliac joint  pain.  Patient      still does not want injections.  3.  Hypertension.  4.  Urinary retention and constipation.  5.  Bilateral greater trochanter bursitis.   PLAN:  1.  I think the patient is doing better with the Avinza so we will stay with      this at 90 mg q.12h.  2.  I reduced her Roxicodone to 40 mg over one month's time.  She will take      one q.12h. p.r.n. breakthrough pain.  3.  Continue home exercise program.  I reviewed her exercises today.  4.  I will see the patient back in about three months time.  She will be      seen by the RN clinic in one month.  Consider facet or SI joint      injections in the future.      Ranelle Oyster, M.D.  Electronically Signed     ZTS/MedQ  D:  08/27/2005 13:54:25  T:  08/28/2005 11:12:42  Job #:  [id number]   cc:   Dario Guardian, M.D.  Fax: [phone]

## 2010-09-20 NOTE — Assessment & Plan Note (Signed)
DATE OF VISIT:  January 17, 2005   MEDICAL RECORD NUMBER:  [id number]   Rhonda Ramos is here in follow-up of her chronic pain syndrome. She has been  involved in intensive psychiatric and psychological treatment. She seems to  be getting better grips on things. She has been happy with the intensity and  quality of her treatment thus far. Pain-wise, she has been doing fairly  well. She comes in a bit early as we shorted her on the number of her pain  pills last time accidentally. She had been taking the MS Contin 60 mg q.8h.  and we had written it for q.12h. She also was only given 60 Roxicodone  tablets. Today, pain is at a 6/10. On average it is about a 3. She states  that pain interferes with general activity, relations with others, and  enjoyment of life on a mild level. Pain is described as sharp, burning,  stabbing, aching, and tingling to a certain extent. The pain is consistent  throughout the day. The patient can walk about 2 or 3 minutes before she has  to stop. She drives and climbs steps on a limited basis.   MEDICATIONS:  1.  Oxycodone 15 mg one q.6h. p.r.n.  2.  MS Contin 60 mg one q.8h.  3.  Other psychiatric and sleep medications as listed in the chart.   REVIEW OF SYSTEMS:  The patient reports numbness, tingling, spasms,  depression, anxiety. Denies any constitutional, GU, GI, or cardiorespiratory  complaints.   PHYSICAL EXAMINATION:  Blood pressure is 130/74, pulse is 94, respiratory  rate is 16. She is saturating 96% in room air. The patient is much more even-  keeled emotionally today. She carried a good conversation, was attentive and  alert. She walked with a limp using her rolling walker today and was steady.  She had good flexibility in all lower extremity joints. No spasticity was  noted. Back remains tender. Reflexes are trace to 1+ in the lower  extremities today. Sensory exam revealed a 1/2 in the bilateral lower  extremities. Heart is regular rate and rhythm,  lungs are clear.   ASSESSMENT:  24.  A 45 year old white female with spina bifida and tethered cord syndrome.  2.  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  3.  Hypertension.  4.  Urinary retention and constipation.  5.  Bilateral greater trochanter bursitis.   PLAN:  1.  Continue with intensive psychiatric and psychological treatment program.      She seems to be doing well. She remains on Zyprexa, Klonopin, and      Ambien, as well as Prozac as listed in the chart.  2.  Will stick with the MS Contin 60 mg q.8h. The pain seems to be under      relative control. She will use her oxycodone 15 mg one to two q.6h.      p.r.n.  3.  Recommended ongoing TENS unit use at home as well as range of motion and      exercise as able.  4.  Will see her back in 2 months' time with me. I will have her follow up      with the nurse in 1 month.      Ranelle Oyster, M.D.  Electronically Signed     ZTS/MedQ  D:  01/17/2005 10:12:18  T:  01/17/2005 10:56:33  Job #:  [id number]   cc:   Rhonda Ramos, M.D.  Fax: [phone]
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
## 2010-10-25 ENCOUNTER — Encounter: Payer: Self-pay | Admitting: Internal Medicine

## 2010-10-25 ENCOUNTER — Ambulatory Visit (INDEPENDENT_AMBULATORY_CARE_PROVIDER_SITE_OTHER): Payer: Medicare Other | Admitting: Internal Medicine

## 2010-10-25 VITALS — BP 128/86 | HR 121 | Temp 99.0°F | Ht <= 58 in

## 2010-10-25 DIAGNOSIS — J0110 Acute frontal sinusitis, unspecified: Secondary | ICD-10-CM

## 2010-10-25 DIAGNOSIS — F329 Major depressive disorder, single episode, unspecified: Secondary | ICD-10-CM

## 2010-10-25 DIAGNOSIS — K219 Gastro-esophageal reflux disease without esophagitis: Secondary | ICD-10-CM

## 2010-10-25 MED ORDER — FLUTICASONE PROPIONATE 50 MCG/ACT NA SUSP: 1.0000 | Freq: Every day | NASAL | Status: DC

## 2010-10-25 MED ORDER — OMEPRAZOLE 20 MG PO CPDR: 20.0000 mg | DELAYED_RELEASE_CAPSULE | Freq: Every day | ORAL | Status: DC

## 2010-10-25 MED ORDER — AZITHROMYCIN 250 MG PO TABS: 250.0000 mg | ORAL_TABLET | Freq: Every day | ORAL | Status: AC

## 2010-10-25 MED ORDER — CLONAZEPAM 0.5 MG PO TABS: 0.5000 mg | ORAL_TABLET | Freq: Two times a day (BID) | ORAL | Status: DC | PRN

## 2010-10-25 NOTE — Patient Instructions (Signed)
It was good to see you today. We have reviewed your prior records including labs and tests today Flonase and Zpak for your sinus symptoms; omeprazole for reflux and esophagus symptoms and klonopin for sleep/anxiety symptoms -  Your prescription(s) have been submitted to your pharmacy. Please take as directed and contact our office if you believe you are having problem(s) with the medication(s).

## 2010-10-25 NOTE — Assessment & Plan Note (Signed)
Start PPI - pt to call if symptoms worse despite tx
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
## 2010-10-25 NOTE — Assessment & Plan Note (Signed)
Large component of anxiety as well as depression Cont SSRI and resume low dose klonopin as prev rx'd - reviewed risk of habit forming tendency

## 2010-11-07 ENCOUNTER — Other Ambulatory Visit: Payer: Self-pay | Admitting: *Deleted

## 2010-11-07 MED ORDER — OXYBUTYNIN CHLORIDE ER 10 MG PO TB24: 10.0000 mg | ORAL_TABLET | Freq: Every day | ORAL | Status: DC

## 2010-11-21 ENCOUNTER — Ambulatory Visit (INDEPENDENT_AMBULATORY_CARE_PROVIDER_SITE_OTHER): Payer: Medicare Other | Admitting: Internal Medicine

## 2010-11-21 ENCOUNTER — Encounter: Payer: Self-pay | Admitting: Internal Medicine

## 2010-11-21 VITALS — BP 112/82 | HR 89 | Temp 99.0°F | Ht <= 58 in

## 2010-11-21 DIAGNOSIS — G894 Chronic pain syndrome: Secondary | ICD-10-CM

## 2010-11-21 DIAGNOSIS — J209 Acute bronchitis, unspecified: Secondary | ICD-10-CM

## 2010-11-21 MED ORDER — TRAMADOL HCL 50 MG PO TABS: 50.0000 mg | ORAL_TABLET | Freq: Three times a day (TID) | ORAL | Status: DC | PRN

## 2010-11-21 MED ORDER — BENZONATATE 100 MG PO CAPS: 100.0000 mg | ORAL_CAPSULE | Freq: Three times a day (TID) | ORAL | Status: DC | PRN

## 2010-11-21 MED ORDER — METHYLPREDNISOLONE ACETATE 80 MG/ML IJ SUSP
80.0000 mg | Freq: Once | INTRAMUSCULAR | Status: AC
  Administered 2010-11-21: 80 mg via INTRAMUSCULAR

## 2010-11-21 NOTE — Patient Instructions (Signed)
It was good to see you today. If you develop worsening symptoms or fever, call and we can reconsider antibiotics, but it does not appear necessary to use antibiotics at this time. Medrol shot given today Tessalon for cough in addition to Robitussin DM as ongoing - Your prescription(s) have been submitted to your pharmacy. Please take as directed and contact our office if you believe you are having problem(s) with the medication(s). Refill on tramadol - ok to use with advil as needed Will refill klonopin on 11/24/10 or after - not more often than every 30days
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
## 2010-11-25 ENCOUNTER — Other Ambulatory Visit: Payer: Self-pay | Admitting: Internal Medicine

## 2010-11-25 NOTE — Telephone Encounter (Signed)
Faxed script back to [facility] @ [phone].Marland KitchenMarland Kitchen7/23/12@1 :23pm/LMB

## 2010-12-23 ENCOUNTER — Other Ambulatory Visit: Payer: Self-pay | Admitting: *Deleted

## 2010-12-23 ENCOUNTER — Other Ambulatory Visit: Payer: Self-pay | Admitting: Internal Medicine

## 2010-12-23 MED ORDER — FLUOXETINE HCL 20 MG PO TABS: 60.0000 mg | ORAL_TABLET | Freq: Every day | ORAL | Status: DC

## 2010-12-24 NOTE — Telephone Encounter (Signed)
Rx faxed to pharmacy  

## 2011-01-12 ENCOUNTER — Other Ambulatory Visit: Payer: Self-pay | Admitting: Internal Medicine

## 2011-01-20 ENCOUNTER — Other Ambulatory Visit: Payer: Self-pay | Admitting: Internal Medicine

## 2011-01-20 NOTE — Telephone Encounter (Signed)
Please Advise in Dr Diamantina Monks absence

## 2011-01-21 ENCOUNTER — Other Ambulatory Visit: Payer: Self-pay | Admitting: *Deleted

## 2011-01-21 MED ORDER — CLONAZEPAM 0.5 MG PO TABS: 0.5000 mg | ORAL_TABLET | Freq: Two times a day (BID) | ORAL | Status: DC | PRN

## 2011-01-21 NOTE — Telephone Encounter (Signed)
Per fax pt is going out of town. Is this ok to refill md is out of office?

## 2011-01-21 NOTE — Telephone Encounter (Signed)
I don't understand this request

## 2011-01-21 NOTE — Telephone Encounter (Signed)
i printed 

## 2011-01-22 ENCOUNTER — Other Ambulatory Visit: Payer: Self-pay | Admitting: Endocrinology

## 2011-01-22 NOTE — Telephone Encounter (Signed)
Rx faxed to pharmacy  

## 2011-01-31 ENCOUNTER — Ambulatory Visit (INDEPENDENT_AMBULATORY_CARE_PROVIDER_SITE_OTHER): Payer: Medicare Other | Admitting: Internal Medicine

## 2011-01-31 ENCOUNTER — Encounter: Payer: Self-pay | Admitting: Internal Medicine

## 2011-01-31 VITALS — BP 112/78 | HR 103 | Temp 98.4°F | Ht <= 58 in

## 2011-01-31 DIAGNOSIS — L700 Acne vulgaris: Secondary | ICD-10-CM

## 2011-01-31 DIAGNOSIS — L03115 Cellulitis of right lower limb: Secondary | ICD-10-CM

## 2011-01-31 DIAGNOSIS — L708 Other acne: Secondary | ICD-10-CM

## 2011-01-31 DIAGNOSIS — G5790 Unspecified mononeuropathy of unspecified lower limb: Secondary | ICD-10-CM

## 2011-01-31 DIAGNOSIS — L02419 Cutaneous abscess of limb, unspecified: Secondary | ICD-10-CM

## 2011-01-31 DIAGNOSIS — L309 Dermatitis, unspecified: Secondary | ICD-10-CM

## 2011-01-31 DIAGNOSIS — L259 Unspecified contact dermatitis, unspecified cause: Secondary | ICD-10-CM

## 2011-01-31 DIAGNOSIS — M792 Neuralgia and neuritis, unspecified: Secondary | ICD-10-CM

## 2011-01-31 MED ORDER — KETOROLAC TROMETHAMINE 60 MG/2ML IM SOLN
60.0000 mg | Freq: Once | INTRAMUSCULAR | Status: AC
  Administered 2011-01-31: 60 mg via INTRAMUSCULAR

## 2011-01-31 MED ORDER — TRIAMCINOLONE ACETONIDE 0.1 % EX OINT: TOPICAL_OINTMENT | Freq: Two times a day (BID) | CUTANEOUS | Status: DC | PRN

## 2011-01-31 MED ORDER — PREGABALIN 50 MG PO CAPS: 50.0000 mg | ORAL_CAPSULE | Freq: Three times a day (TID) | ORAL | Status: DC

## 2011-01-31 MED ORDER — SULFAMETHOXAZOLE-TRIMETHOPRIM 800-160 MG PO TABS: 1.0000 | ORAL_TABLET | Freq: Two times a day (BID) | ORAL | Status: AC

## 2011-01-31 NOTE — Patient Instructions (Addendum)
It was good to see you today. Triamcinolone ointment for your rash, Septra antibiotics for the infection and Lyrica for pain Your prescription(s) have been submitted to your pharmacy. Please take as directed and contact our office if you believe you are having problem(s) with the medication(s). Keep leg rash covered with sock or gauze after applying prescription ointment and avoid contact with leg brace until healed Tordol shot for pain given in office today No other pain pills as discussed!!! Talk to your sponsor as needed
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
## 2011-02-11 LAB — CBC
HCT: 37.5
Hemoglobin: 12.6
WBC: 8.5

## 2011-02-11 LAB — BASIC METABOLIC PANEL
Calcium: 8.8
GFR calc Af Amer: >60
GFR calc non Af Amer: 56 — ABNORMAL LOW
Potassium: 3.9
Sodium: 132 — ABNORMAL LOW

## 2011-02-18 ENCOUNTER — Other Ambulatory Visit: Payer: Self-pay | Admitting: Endocrinology

## 2011-02-18 NOTE — Telephone Encounter (Signed)
Please advise in Dr. Diamantina Monks absence-last written 01/21/2011 #60 with 0 refills

## 2011-02-18 NOTE — Telephone Encounter (Signed)
Rx signed/faxed.

## 2011-03-19 ENCOUNTER — Other Ambulatory Visit: Payer: Self-pay | Admitting: Endocrinology

## 2011-03-20 NOTE — Telephone Encounter (Signed)
Rx faxed to pharmacy  

## 2011-03-25 ENCOUNTER — Ambulatory Visit: Payer: Medicare Other | Admitting: Internal Medicine

## 2011-03-26 ENCOUNTER — Ambulatory Visit (INDEPENDENT_AMBULATORY_CARE_PROVIDER_SITE_OTHER): Payer: Medicare Other | Admitting: Internal Medicine

## 2011-03-26 ENCOUNTER — Encounter: Payer: Self-pay | Admitting: Internal Medicine

## 2011-03-26 VITALS — BP 102/72 | HR 90 | Temp 98.4°F

## 2011-03-26 DIAGNOSIS — L259 Unspecified contact dermatitis, unspecified cause: Secondary | ICD-10-CM

## 2011-03-26 DIAGNOSIS — L309 Dermatitis, unspecified: Secondary | ICD-10-CM

## 2011-03-26 DIAGNOSIS — G894 Chronic pain syndrome: Secondary | ICD-10-CM

## 2011-03-26 DIAGNOSIS — Q059 Spina bifida, unspecified: Secondary | ICD-10-CM

## 2011-03-26 MED ORDER — LIDOCAINE HCL 2 % EX GEL: CUTANEOUS | Status: DC

## 2011-03-26 MED ORDER — PREDNISONE (PAK) 10 MG PO TABS: 10.0000 mg | ORAL_TABLET | ORAL | Status: DC

## 2011-03-26 NOTE — Patient Instructions (Signed)
It was good to see you today. For your rash use prednisone and lidocaine gel as discussed - Your prescription(s) have been submitted to your pharmacy. Please take as directed and contact our office if you believe you are having problem(s) with the medication(s). we'll make referral to dermatology and to back pain specialist . Our office will contact you regarding appointment(s) once made. Other medications reviewed and no changes made today I am proud of you for remaining off of addictive medications like narcotic pain medications
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
## 2011-04-03 ENCOUNTER — Inpatient Hospital Stay (HOSPITAL_COMMUNITY)
Admission: EM | Admit: 2011-04-03 | Discharge: 2011-04-07 | DRG: 201 | Disposition: A | Discharge status: Home / Self Care | Payer: Medicare Other | Source: Ambulatory Visit | Attending: Surgery | Admitting: Surgery

## 2011-04-03 ENCOUNTER — Encounter: Payer: Self-pay | Admitting: Internal Medicine

## 2011-04-03 ENCOUNTER — Emergency Department (HOSPITAL_COMMUNITY): Payer: Medicare Other

## 2011-04-03 ENCOUNTER — Encounter (HOSPITAL_COMMUNITY): Payer: Self-pay | Admitting: Emergency Medicine

## 2011-04-03 ENCOUNTER — Ambulatory Visit (INDEPENDENT_AMBULATORY_CARE_PROVIDER_SITE_OTHER)
Admission: RE | Admit: 2011-04-03 | Discharge: 2011-04-03 | Disposition: A | Discharge status: Home / Self Care | Payer: Medicare Other | Source: Ambulatory Visit | Attending: Internal Medicine | Admitting: Internal Medicine

## 2011-04-03 ENCOUNTER — Ambulatory Visit (INDEPENDENT_AMBULATORY_CARE_PROVIDER_SITE_OTHER): Payer: Medicare Other | Admitting: Internal Medicine

## 2011-04-03 VITALS — BP 122/86 | HR 109 | Temp 98.8°F

## 2011-04-03 DIAGNOSIS — G894 Chronic pain syndrome: Secondary | ICD-10-CM | POA: Diagnosis present

## 2011-04-03 DIAGNOSIS — J9383 Other pneumothorax: Secondary | ICD-10-CM

## 2011-04-03 DIAGNOSIS — Z23 Encounter for immunization: Secondary | ICD-10-CM

## 2011-04-03 DIAGNOSIS — R0989 Other specified symptoms and signs involving the circulatory and respiratory systems: Secondary | ICD-10-CM

## 2011-04-03 DIAGNOSIS — F411 Generalized anxiety disorder: Secondary | ICD-10-CM

## 2011-04-03 DIAGNOSIS — R0609 Other forms of dyspnea: Secondary | ICD-10-CM

## 2011-04-03 DIAGNOSIS — K5900 Constipation, unspecified: Secondary | ICD-10-CM | POA: Diagnosis present

## 2011-04-03 DIAGNOSIS — F419 Anxiety disorder, unspecified: Secondary | ICD-10-CM

## 2011-04-03 DIAGNOSIS — R87619 Unspecified abnormal cytological findings in specimens from cervix uteri: Secondary | ICD-10-CM

## 2011-04-03 DIAGNOSIS — M4120 Other idiopathic scoliosis, site unspecified: Secondary | ICD-10-CM | POA: Diagnosis present

## 2011-04-03 DIAGNOSIS — R0600 Dyspnea, unspecified: Secondary | ICD-10-CM

## 2011-04-03 DIAGNOSIS — R0902 Hypoxemia: Secondary | ICD-10-CM

## 2011-04-03 DIAGNOSIS — J939 Pneumothorax, unspecified: Secondary | ICD-10-CM

## 2011-04-03 DIAGNOSIS — J930 Spontaneous tension pneumothorax: Secondary | ICD-10-CM

## 2011-04-03 HISTORY — DX: Pneumothorax, unspecified: J93.9

## 2011-04-03 HISTORY — PX: OTHER SURGICAL HISTORY: SHX169

## 2011-04-03 HISTORY — DX: Other chronic pain: G89.29

## 2011-04-03 HISTORY — DX: Dorsalgia, unspecified: M54.9

## 2011-04-03 LAB — CBC
MCHC: 33.6 g/dL (ref 30.0–36.0)
MCV: 94.2 fL (ref 78.0–100.0)
Platelets: 350 10*3/uL (ref 150–400)
Platelets: 298 10*3/uL (ref 150–400)
RDW: 13.6 % (ref 11.5–15.5)
RDW: 13.6 % (ref 11.5–15.5)
WBC: 9.2 10*3/uL (ref 4.0–10.5)

## 2011-04-03 LAB — BASIC METABOLIC PANEL
BUN: 11 mg/dL (ref 6–23)
Creatinine, Ser: 0.86 mg/dL (ref 0.50–1.10)
GFR calc Af Amer: >90 mL/min (ref >90)
GFR calc non Af Amer: 81 mL/min — ABNORMAL LOW (ref >90)
Potassium: 3.6 mEq/L (ref 3.5–5.1)

## 2011-04-03 LAB — COMPREHENSIVE METABOLIC PANEL
ALT: 11 U/L (ref 0–35)
Alkaline Phosphatase: 93 U/L (ref 39–117)
CO2: 27 mEq/L (ref 19–32)
Calcium: 8.7 mg/dL (ref 8.4–10.5)
GFR calc Af Amer: 88 mL/min — ABNORMAL LOW (ref >90)
GFR calc non Af Amer: 76 mL/min — ABNORMAL LOW (ref >90)
Glucose, Bld: 100 mg/dL — ABNORMAL HIGH (ref 70–99)
Potassium: 3.5 mEq/L (ref 3.5–5.1)
Sodium: 140 mEq/L (ref 135–145)
Total Bilirubin: 0.2 mg/dL — ABNORMAL LOW (ref 0.3–1.2)

## 2011-04-03 LAB — DIFFERENTIAL
Basophils Absolute: 0 10*3/uL (ref 0.0–0.1)
Eosinophils Relative: 1 % (ref 0–5)
Lymphocytes Relative: 26 % (ref 12–46)

## 2011-04-03 LAB — PROTIME-INR: Prothrombin Time: 13 seconds (ref 11.6–15.2)

## 2011-04-03 MED ORDER — ACETAMINOPHEN 325 MG PO TABS
650.0000 mg | ORAL_TABLET | Freq: Four times a day (QID) | ORAL | Status: DC | PRN
  Administered 2011-04-06: 650 mg via ORAL
  Filled 2011-04-03 (×2): qty 2

## 2011-04-03 MED ORDER — NORETHINDRONE 0.35 MG PO TABS: 1.0000 | ORAL_TABLET | Freq: Every day | ORAL | Status: DC

## 2011-04-03 MED ORDER — THERA M PLUS PO TABS
1.0000 | ORAL_TABLET | Freq: Every day | ORAL | Status: DC
  Administered 2011-04-03 – 2011-04-07 (×4): 1 via ORAL
  Filled 2011-04-03 (×5): qty 1

## 2011-04-03 MED ORDER — SODIUM CHLORIDE 0.9 % IJ SOLN
3.0000 mL | Freq: Two times a day (BID) | INTRAMUSCULAR | Status: DC
  Administered 2011-04-03 – 2011-04-05 (×5): 3 mL via INTRAVENOUS
  Administered 2011-04-06: 10 mL via INTRAVENOUS
  Administered 2011-04-06 – 2011-04-07 (×2): 3 mL via INTRAVENOUS

## 2011-04-03 MED ORDER — HYDROMORPHONE HCL PF 1 MG/ML IJ SOLN
1.0000 mg | Freq: Once | INTRAMUSCULAR | Status: AC
  Administered 2011-04-03: 1 mg via INTRAVENOUS
  Filled 2011-04-03 (×2): qty 1

## 2011-04-03 MED ORDER — ONDANSETRON HCL 4 MG/2ML IJ SOLN: 4.0000 mg | Freq: Four times a day (QID) | INTRAMUSCULAR | Status: DC | PRN

## 2011-04-03 MED ORDER — AMLODIPINE BESYLATE 5 MG PO TABS
5.0000 mg | ORAL_TABLET | Freq: Every day | ORAL | Status: DC
  Administered 2011-04-04 – 2011-04-07 (×4): 5 mg via ORAL
  Filled 2011-04-03 (×4): qty 1

## 2011-04-03 MED ORDER — ACETAMINOPHEN 650 MG RE SUPP: 650.0000 mg | Freq: Four times a day (QID) | RECTAL | Status: DC | PRN

## 2011-04-03 MED ORDER — MIDAZOLAM HCL 2 MG/2ML IJ SOLN
2.0000 mg | Freq: Once | INTRAMUSCULAR | Status: AC
  Administered 2011-04-03: 2 mg via INTRAVENOUS

## 2011-04-03 MED ORDER — LEVETIRACETAM 250 MG PO TABS
250.0000 mg | ORAL_TABLET | Freq: Every day | ORAL | Status: DC
  Administered 2011-04-03 – 2011-04-07 (×5): 250 mg via ORAL
  Filled 2011-04-03 (×5): qty 1

## 2011-04-03 MED ORDER — DOCUSATE SODIUM 100 MG PO CAPS
100.0000 mg | ORAL_CAPSULE | Freq: Two times a day (BID) | ORAL | Status: DC
  Administered 2011-04-03 – 2011-04-07 (×5): 100 mg via ORAL
  Filled 2011-04-03 (×8): qty 1

## 2011-04-03 MED ORDER — FLUOXETINE HCL 20 MG PO CAPS
60.0000 mg | ORAL_CAPSULE | Freq: Every day | ORAL | Status: DC
  Administered 2011-04-04 – 2011-04-07 (×4): 60 mg via ORAL
  Filled 2011-04-03 (×4): qty 3

## 2011-04-03 MED ORDER — FENTANYL CITRATE 0.05 MG/ML IJ SOLN
50.0000 ug | Freq: Once | INTRAMUSCULAR | Status: AC
  Administered 2011-04-03: 15:00:00 via INTRAVENOUS

## 2011-04-03 MED ORDER — LORAZEPAM 2 MG/ML IJ SOLN
1.0000 mg | Freq: Once | INTRAMUSCULAR | Status: AC
  Administered 2011-04-03: 1 mg via INTRAVENOUS
  Filled 2011-04-03 (×2): qty 1

## 2011-04-03 MED ORDER — ONDANSETRON HCL 4 MG PO TABS
4.0000 mg | ORAL_TABLET | Freq: Four times a day (QID) | ORAL | Status: DC | PRN
  Administered 2011-04-05: 4 mg via ORAL
  Filled 2011-04-03: qty 1

## 2011-04-03 MED ORDER — OXYBUTYNIN CHLORIDE ER 10 MG PO TB24
10.0000 mg | ORAL_TABLET | Freq: Every day | ORAL | Status: DC
  Administered 2011-04-04 – 2011-04-07 (×4): 10 mg via ORAL
  Filled 2011-04-03 (×4): qty 1

## 2011-04-03 MED ORDER — HYDROCODONE-HOMATROPINE 5-1.5 MG/5ML PO SYRP: 5.0000 mL | ORAL_SOLUTION | Freq: Four times a day (QID) | ORAL | Status: DC | PRN

## 2011-04-03 MED ORDER — CLONAZEPAM 0.5 MG PO TABS
0.5000 mg | ORAL_TABLET | Freq: Two times a day (BID) | ORAL | Status: DC | PRN
  Administered 2011-04-03 – 2011-04-07 (×8): 0.5 mg via ORAL
  Filled 2011-04-03 (×8): qty 1

## 2011-04-03 MED ORDER — NORETHINDRONE 0.35 MG PO TABS
1.0000 | ORAL_TABLET | Freq: Every day | ORAL | Status: DC
  Administered 2011-04-04 – 2011-04-07 (×4): 0.35 mg via ORAL

## 2011-04-03 MED ORDER — ALUM & MAG HYDROXIDE-SIMETH 200-200-20 MG/5ML PO SUSP
30.0000 mL | Freq: Four times a day (QID) | ORAL | Status: DC | PRN
  Administered 2011-04-05 – 2011-04-06 (×2): 30 mL via ORAL
  Filled 2011-04-03 (×2): qty 30

## 2011-04-03 MED ORDER — PNEUMOCOCCAL VAC POLYVALENT 25 MCG/0.5ML IJ INJ: 0.5000 mL | INJECTION | INTRAMUSCULAR | Status: DC

## 2011-04-03 MED ORDER — SODIUM CHLORIDE 0.9 % IV BOLUS (SEPSIS)
1000.0000 mL | Freq: Once | INTRAVENOUS | Status: AC
  Administered 2011-04-03 (×2): 1000 mL via INTRAVENOUS

## 2011-04-03 MED ORDER — SODIUM CHLORIDE 0.9 % IV SOLN: 250.0000 mL | INTRAVENOUS | Status: DC | PRN

## 2011-04-03 MED ORDER — MIDAZOLAM HCL 2 MG/2ML IJ SOLN
INTRAMUSCULAR | Status: AC
  Administered 2011-04-03: 2 mg via INTRAVENOUS
  Filled 2011-04-03: qty 6

## 2011-04-03 MED ORDER — GUAIFENESIN-DM 100-10 MG/5ML PO SYRP
5.0000 mL | ORAL_SOLUTION | ORAL | Status: DC | PRN
  Administered 2011-04-04 – 2011-04-05 (×2): 5 mL via ORAL
  Filled 2011-04-03 (×2): qty 5

## 2011-04-03 MED ORDER — MIDAZOLAM HCL 2 MG/2ML IJ SOLN
2.0000 mg | Freq: Once | INTRAMUSCULAR | Status: AC
Start: 2011-04-03 — End: 2011-04-03
  Administered 2011-04-03: 2 mg via INTRAVENOUS

## 2011-04-03 MED ORDER — HYDROMORPHONE HCL 2 MG PO TABS
2.0000 mg | ORAL_TABLET | ORAL | Status: DC | PRN
  Administered 2011-04-03 (×2): 2 mg via ORAL
  Filled 2011-04-03 (×2): qty 1
  Filled 2011-04-03: qty 2

## 2011-04-03 MED ORDER — PANTOPRAZOLE SODIUM 40 MG PO TBEC
40.0000 mg | DELAYED_RELEASE_TABLET | Freq: Every day | ORAL | Status: DC
  Administered 2011-04-03 – 2011-04-06 (×4): 40 mg via ORAL
  Filled 2011-04-03 (×2): qty 1

## 2011-04-03 MED ORDER — FENTANYL CITRATE 0.05 MG/ML IJ SOLN
INTRAMUSCULAR | Status: AC
  Filled 2011-04-03: qty 4

## 2011-04-03 MED ORDER — PREDNISONE (PAK) 10 MG PO TABS: 10.0000 mg | ORAL_TABLET | ORAL | Status: DC

## 2011-04-03 MED ORDER — BUSPIRONE HCL 15 MG PO TABS: 15.0000 mg | ORAL_TABLET | Freq: Two times a day (BID) | ORAL | Status: DC

## 2011-04-03 NOTE — ED Notes (Signed)
Pt states breathing is better. Pt remained fully awake but tolerated well. md secured and dressed site.

## 2011-04-03 NOTE — Patient Instructions (Signed)
It was good to see you today. Test(s) ordered today. Your results will be called to you after review (48-72hours after test completion). If any changes need to be made, you will be notified at that time. Continue prednisone pack and use prescription cough syrup as needed - Start BuSpar in addition to Prozac and Klonopin for anxiety symptoms- Your new prescription(s) have been submitted to your pharmacy. Please take as directed and contact our office if you believe you are having problem(s) with the medication(s). Other medications reviewed, no additional changes today

## 2011-04-03 NOTE — H&P (Signed)
Rhonda Ramos is an 45 y.o. female.  @bday  WRU:[id number] Chief Complaint:Right sided chest pain with shortness of breath HPI: The patient began to experience right-sided chest pain and shortness of breath on the evening before last. The episode came on acutely with the primary symptom related to pain. This was mostly in the anterior chest and radiated into the shoulder. She additionally has a dry cough. She denies fever or or other constitutional symptoms. She presented to her primary physician today and a chest x-ray was obtained. She was discharged home but advised to present to the emergency room after results of the chest x-ray revealed that there was a large right-sided pneumothorax. There is approximately 90% volume loss. Cardiothoracic surgical consultation was obtained with Dr. Laneta Simmers who placed a chest tube. She is now to be admitted for further observation and management.  Past Medical History  Diagnosis Date  . Chronic pain syndrome     s/p rehab - narc detox fall 2011  . SPINA BIFIDA   . Scoliosis     multiple surg for same  . DEPRESSION     > ptsd (spouse suicide/gsw 11/2005  . GERD   . HYPERTENSION   . Anxiety   . Arthritis   . Seizures     onset fall 2011 with narc detox    Past Surgical History  Procedure Date  . Spine surgery 1968  . Lumbar laminectomy for tethered cord release     x's 4 (84,94,98, and 02)  . 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 Scoliosis 1982    neck fused w/bones    Family History  Problem Relation Age of Onset  . Diabetes Father   . Arthritis Other     parent & grandparent  . Diabetes Other     grandparent   Social History:  reports that she quit smoking about 19 months ago. She does not have any smokeless tobacco history on file. She reports that she does not drink alcohol or use illicit drugs.  Allergies:  Allergies  Allergen Reactions  . Latex Shortness Of Breath  . Codeine Nausea And Vomiting  . Erythromycin Nausea And  Vomiting    Medications Prior to Admission  Medication Dose Route Frequency Provider Last Rate Last Dose  . fentaNYL (SUBLIMAZE) injection 50 mcg  50 mcg Intravenous Once Glynn Octave, MD      . fentaNYL (SUBLIMAZE) injection 50 mcg  50 mcg Intravenous Once Glynn Octave, MD      . HYDROmorphone (DILAUDID) injection 1 mg  1 mg Intravenous Once Glynn Octave, MD   1 mg at 04/03/11 1421  . LORazepam (ATIVAN) injection 1 mg  1 mg Intravenous Once Glynn Octave, MD   1 mg at 04/03/11 1421  . midazolam (VERSED) injection 2 mg  2 mg Intravenous Once Glynn Octave, MD   2 mg at 04/03/11 1443  . midazolam (VERSED) injection 2 mg  2 mg Intravenous Once Glynn Octave, MD   2 mg at 04/03/11 1447  . midazolam (VERSED) injection 2 mg  2 mg Intravenous Once Glynn Octave, MD   2 mg at 04/03/11 1455  . sodium chloride 0.9 % bolus 1,000 mL  1,000 mL Intravenous Once Glynn Octave, MD   1,000 mL at 04/03/11 1437   Medications Prior to Admission  Medication Sig Dispense Refill  . amLODipine (NORVASC) 5 MG tablet Take 1 tablet (5 mg total) by mouth daily.  30 tablet  5  . clonazePAM (KLONOPIN)  0.5 MG tablet take 1 tablet by mouth twice a day if needed for anxiety  60 tablet  0  . FLUoxetine (PROZAC) 20 MG tablet Take 3 tablets (60 mg total) by mouth daily. Take 3 by mouth once daily  90 tablet  5  . levETIRAcetam (KEPPRA) 250 MG tablet Take 250 mg by mouth daily.       Marland Kitchen lidocaine (XYLOCAINE JELLY) 2 % jelly Apply to affected area daily prn  30 mL  1  . norethindrone (CAMILA) 0.35 MG tablet Take 1 tablet (0.35 mg total) by mouth daily.  28 tablet  11  . omeprazole (PRILOSEC) 20 MG capsule take 1 capsule by mouth once daily  30 capsule  5  . oxybutynin (DITROPAN-XL) 10 MG 24 hr tablet Take 1 tablet (10 mg total) by mouth daily.  30 tablet  5  . traMADol (ULTRAM) 50 MG tablet Take 1 tablet (50 mg total) by mouth every 8 (eight) hours as needed.  60 tablet  1    No results found for this or any  previous visit (from the past 48 hour(s)). Dg Chest 2 View  04/03/2011  *RADIOLOGY REPORT*  Clinical Data: Cough, short of breath, chest pain  CHEST - 2 VIEW  Comparison: Chest x-ray of 03/31/2010  Findings: There is a large right-sided pneumothorax approaching 90% with volume loss at the right lung base.  The left lung is clear. Heart size is normal.  Thoracolumbar scoliosis again is noted.  IMPRESSION: Approximately 90% right pneumothorax.  Critical Value/emergent results were called by telephone at the time of interpretation on 04/03/2011  at 1:28 p.m.  to  Dr. Felicity Coyer, who verbally acknowledged these results.  Original Report Authenticated By: Juline Patch, M.D.   Review of Systems - General ROS: right shoulder pain Psychological ROS: positive for - anxiety, depression and memory difficulties Ophthalmic WUJ:[id number] ENT ROS: negative Respiratory ROS: positive for - cough, pleuritic pain and shortness of breath Cardiovascular ROS: positive for -   rapid heart rate Gastrointestinal ROS: positive for - constipation  Genito-Urinary ROS: positive for - incontinence and self catheterizes Musculoskeletal ROS: positive for - gait disturbance, joint pain, joint stiffness, muscle pain and muscular weakness Neurological ROS: positive for - bowel and bladder control changes, gait disturbance, headaches, impaired coordination/balance, numbness/tingling, seizures and weakness  Blood pressure 140/82, pulse 120, temperature 97.9 F (36.6 C), temperature source Oral, resp. rate 22, SpO2 98.00%.   BP 140/82  Pulse 120  Temp(Src) 97.9 F (36.6 C) (Oral)  Resp 22  SpO2 98%  General Appearance:    Alert, cooperative, no distress, appears older than stated age  Head:    Normocephalic, without obvious abnormality, atraumatic  Eyes:    PERRL, conjunctiva/corneas clear, EOM's intact,   Ears:    Normal TM's and external ear canals, both ears  Nose:   Nares normal, septum midline, mucosa normal, no  drainage    or sinus tenderness  Throat:   Lips, mucosa, and tongue normal; edentulous  Neck:   Supple, symmetrical, trachea midline, no adenopathy;    thyroid:  no enlargement/tenderness/nodules; no  JVD  Back:     asymmetric,   Lungs:     Clear to auscultation bilaterally, respirations unlabored after tube placed  Chest Wall:    No tenderness or deformity   Heart:    Regular rate and rhythm, S1 and S2 normal, no murmur, rub   or gallop  Breast Exam:    not examined  Abdomen:  Soft, non-tender, bowel sounds active all four quadrants,    no masses, no organomegaly  Genitalia:  defer  Rectal:  defer  Extremities:    no cyanosis or edema  Pulses:   2+ and symmetric all extremities  Skin:   Skin color, texture, turgor normal, no rashes or lesions  Lymph nodes:   Cervical, supraclavicular, and axillary nodes normal  Neurologic:    2-3/5 LE  Strength with L weaker than R, decreased sensation Bilat LE's grossly. Gait not tested         Assessment/Plan 45 year old female with multiple chronic medical and neuromuscular comorbidities with first-time occurrence right-sided spontaneous pneumothorax. A chest tube has been placed and she will be in admitted to the hospital for further monitoring and treatment.  Tyaisha Cullom E 04/03/2011, 3:37 PM

## 2011-04-03 NOTE — ED Notes (Signed)
Pt was awake throughout procedure, Md's explained step by step and pt aware chest tube will need to be placed for a few days per Md.  Md suturing into place at present. Chest tube connected to pleuravac.

## 2011-04-03 NOTE — ED Provider Notes (Signed)
History     CSN: [id number] Arrival date & time: 04/03/2011  2:06 PM   First MD Initiated Contact with Patient 04/03/11 1409      Chief Complaint  Patient presents with  . Shortness of Breath    pt states sob and right chest pain for 2 days, states her doctor told her to come here because her right lung was colapsed. md here to see pt.    (Consider location/radiation/quality/duration/timing/severity/associated sxs/prior treatment) HPI Comments: Patient presents with acute onset of shortness of breath that started yesterday morning. She saw her PCP today for the symptoms and chest congestion. She had a chest x-ray that revealed 90% right-sided pneumothorax. She denies any fever but does endorse some right-sided chest pain is worse with inspiration. She is an active smoker but denies any history of COPD or previous pneumothorax. No trauma. Her vitals are stable and she is not hypoxic but very anxious. She is a history of spina bifida and chronic pain issues.  The history is provided by the patient and a caregiver.    Past Medical History  Diagnosis Date  . Chronic pain syndrome     s/p rehab - narc detox fall 2011  . SPINA BIFIDA   . Scoliosis     multiple surg for same  . DEPRESSION     > ptsd (spouse suicide/gsw 11/2005  . GERD   . HYPERTENSION   . Anxiety   . Arthritis   . Seizures     onset fall 2011 with narc detox    Past Surgical History  Procedure Date  . Spine surgery 1968  . Lumbar laminectomy for tethered cord release     x's 4 (84,94,98, and 02)  . 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 Scoliosis 1982    neck fused w/bones    Family History  Problem Relation Age of Onset  . Diabetes Father   . Arthritis Other     parent & grandparent  . Diabetes Other     grandparent    History  Substance Use Topics  . Smoking status: Former Smoker    Quit date: 09/02/2009  . Smokeless tobacco: Not on file   Comment: Widowed, lives alone  . Alcohol Use: No      OB History    Grav Para Term Preterm Abortions TAB SAB Ect Mult Living                  Review of Systems  Unable to perform ROS: Unstable vital signs  Respiratory: Positive for shortness of breath.     Allergies  Latex; Codeine; and Erythromycin  Home Medications   Current Outpatient Rx  Name Route Sig Dispense Refill  . AMLODIPINE BESYLATE 5 MG PO TABS Oral Take 1 tablet (5 mg total) by mouth daily. 30 tablet 5  . CLONAZEPAM 0.5 MG PO TABS  take 1 tablet by mouth twice a day if needed for anxiety 60 tablet 0  . FLUOXETINE HCL 20 MG PO TABS Oral Take 3 tablets (60 mg total) by mouth daily. Take 3 by mouth once daily 90 tablet 5  . LEVETIRACETAM 250 MG PO TABS Oral Take 250 mg by mouth daily.     Marland Kitchen LIDOCAINE HCL 2 % EX GEL  Apply to affected area daily prn 30 mL 1  . NORETHINDRONE 0.35 MG PO TABS Oral Take 1 tablet (0.35 mg total) by mouth daily. 28 tablet 11  . OMEPRAZOLE 20 MG PO  CPDR  take 1 capsule by mouth once daily 30 capsule 5  . OXYBUTYNIN CHLORIDE ER 10 MG PO TB24 Oral Take 1 tablet (10 mg total) by mouth daily. 30 tablet 5  . TRAMADOL HCL 50 MG PO TABS Oral Take 1 tablet (50 mg total) by mouth every 8 (eight) hours as needed. 60 tablet 1    BP 140/82  Pulse 120  Temp(Src) 97.9 F (36.6 C) (Oral)  Resp 22  SpO2 98%  Physical Exam  Constitutional: She is oriented to person, place, and time. She appears well-developed and well-nourished. She appears distressed.       Anxious and tachypneic  HENT:  Head: Normocephalic and atraumatic.  Mouth/Throat: Oropharynx is clear and moist. No oropharyngeal exudate.  Eyes: Conjunctivae are normal. Pupils are equal, round, and reactive to light.  Neck: Normal range of motion.  Cardiovascular: Normal rate, regular rhythm and normal heart sounds.   Pulmonary/Chest: Breath sounds normal. She is in respiratory distress.       Decreased BS on R.  Clear on L.  Abdominal: Soft. There is no tenderness. There is no rebound and  no guarding.  Musculoskeletal: Normal range of motion. She exhibits no edema and no tenderness.  Neurological: She is alert and oriented to person, place, and time. No cranial nerve deficit.  Skin: Skin is warm.    ED Course  Procedures (including critical care time)   Labs Reviewed  CBC  DIFFERENTIAL  PROTIME-INR  BASIC METABOLIC PANEL  CARDIAC PANEL(CRET KIN+CKTOT+MB+TROPI)   Dg Chest 2 View  04/03/2011  *RADIOLOGY REPORT*  Clinical Data: Cough, short of breath, chest pain  CHEST - 2 VIEW  Comparison: Chest x-ray of 03/31/2010  Findings: There is a large right-sided pneumothorax approaching 90% with volume loss at the right lung base.  The left lung is clear. Heart size is normal.  Thoracolumbar scoliosis again is noted.  IMPRESSION: Approximately 90% right pneumothorax.  Critical Value/emergent results were called by telephone at the time of interpretation on 04/03/2011  at 1:28 p.m.  to  Dr. Felicity Coyer, who verbally acknowledged these results.  Original Report Authenticated By: Juline Patch, M.D.     1. Pneumothorax       MDM  SOB with spontaneous pneumothorax.  Vitals stable.  No hypoxia.   Will obtain labs and prepare for chest tube.  D/w Dr. Laneta Simmers of thoracic surgery.    Date: 04/03/2011  Rate: 129  Rhythm: sinus tachycardia  QRS Axis: normal  Intervals: normal  ST/T Wave abnormalities: nonspecific ST/T changes  Conduction Disutrbances:first-degree A-V block   Narrative Interpretation:   Old EKG Reviewed: unchanged      Glynn Octave, MD 04/03/11 1545

## 2011-04-03 NOTE — Progress Notes (Addendum)
Subjective:    Patient ID: Rhonda Ramos, female    DOB: 06-Jul-1965, 45 y.o.   MRN: [id number]  HPI  complains of shortness of breath and chest congestion Onset symptoms yesterday AM associated with dry cough, anxiety about pain symptoms and dizzy sensation with exertion Denies fever or chest pain - Not improved with OTC cough medications  Again requests MS contin - "taking a friends Vicodin and doesn't do anything" -  await sched apt with back specialist (chronic back pain symptoms without change)  Past Medical History  Diagnosis Date  . Chronic pain syndrome     s/p rehab - narc detox fall 2011  . SPINA BIFIDA   . Scoliosis     multiple surg for same  . DEPRESSION     > ptsd (spouse suicide/gsw 11/2005  . GERD   . HYPERTENSION   . Anxiety   . Arthritis   . Seizures     onset fall 2011 with narc detox    Review of Systems  Constitutional: Negative for fever and fatigue.  Musculoskeletal: Positive for back pain (chronic). Gait problem: chronic due to scoli deformity.  Skin: Rash: posterior R calf - faded since on pred pak.       Objective:   Physical Exam BP 122/86  Pulse 109  Temp(Src) 98.8 F (37.1 C) (Oral)  SpO2 91% Wt Readings from Last 3 Encounters:  08/28/10 113 lb 1.9 oz (51.311 kg)  12/04/09 91 lb (41.277 kg)  11/13/09 91 lb (41.277 kg)   General:  small statured woman, older appearing than stated age, uncomfortable and hyperventilating Neck:  thick, short - supple. No LAD Lungs:  shallow respiratory effort with use of accessory muscles; (component of anxiety) - but normal breath sounds bilaterally - no crackles or wheeze Heart:  normal rate, regular rhythm, no murmur, and no rub. BLE without edema. Msk:  marked scoli deformitiy with left curvature t-spine - multiple well healed scars on back - Psych:  tremulous, moderately anxious - Oriented X3, memory intact for recent and remote, normally interactive, and fair eye contact.    Lab Results  Component  Value Date   WBC 6.2 04/02/2010   HGB 12.9 04/02/2010   HCT 38.5 04/02/2010   PLT 268 04/02/2010   GLUCOSE 92 04/02/2010   ALT 32 04/01/2010   AST 31 04/01/2010   NA 136 04/02/2010   K 4.2 04/02/2010   CL 108 04/02/2010   CREATININE 0.90 04/02/2010   BUN 11 04/02/2010   CO2 24 04/02/2010   TSH 0.75 02/07/2008   HGBA1C 5.4 05/08/2008       Assessment & Plan:  Dyspnea - onset 48 hours ago - associated with shallow respiratory effort, hypoxia and dry occasional cough -no fever or sputum  Check chest x-ray today>> if unremarkable, consider CT Continue prednisone pack Prescription cough medication to help symptoms - reminded no refills will be given due to history of narcotic dependency Flu shot okay at patient request is afebrile   Anxiety -on Prozac and Klonopin. Add BuSpar   ADDENDUM: received call report from Dr. Gery Pray CXR with 90% R ptx Call to pulm Dr. Craige Cotta re: coordination of chest tube placement> advised ER Called pt on home #: [phone] (home) [phone] (work)> still shortness of breath but no change Advised on CXR findings and need to go straight to [facility] ER for attention/tx> pt understands and agrees Then called ED @ [facility] > spoke with Dr. Manus Gunning who is aware of pt and problem>  will coordinate with CCM if needed

## 2011-04-03 NOTE — ED Notes (Signed)
Pt states has had sob and right sided chest pain for 2 days and was told by her doctor she has a collapsed lung on the right side.

## 2011-04-03 NOTE — ED Notes (Signed)
Offered food and drinks to patients family.  They stated, "nothing needed at this time."

## 2011-04-04 ENCOUNTER — Inpatient Hospital Stay (HOSPITAL_COMMUNITY): Payer: Medicare Other

## 2011-04-04 MED ORDER — KETOROLAC TROMETHAMINE 15 MG/ML IJ SOLN
15.0000 mg | Freq: Four times a day (QID) | INTRAMUSCULAR | Status: DC
  Administered 2011-04-04 – 2011-04-07 (×12): 15 mg via INTRAVENOUS
  Filled 2011-04-04 (×16): qty 1

## 2011-04-04 MED ORDER — IOHEXOL 300 MG/ML  SOLN
100.0000 mL | Freq: Once | INTRAMUSCULAR | Status: AC | PRN
  Administered 2011-04-04: 100 mL via INTRAVENOUS

## 2011-04-04 MED ORDER — TRIAMCINOLONE ACETONIDE 0.1 % EX CREA
TOPICAL_CREAM | Freq: Two times a day (BID) | CUTANEOUS | Status: DC
  Administered 2011-04-04 – 2011-04-07 (×7): via TOPICAL
  Filled 2011-04-04: qty 15

## 2011-04-04 MED ORDER — HYDROMORPHONE HCL 4 MG PO TABS
4.0000 mg | ORAL_TABLET | ORAL | Status: DC | PRN
  Administered 2011-04-04 – 2011-04-07 (×22): 4 mg via ORAL
  Filled 2011-04-04 (×2): qty 2
  Filled 2011-04-04: qty 1
  Filled 2011-04-04 (×2): qty 2
  Filled 2011-04-04: qty 1
  Filled 2011-04-04: qty 2
  Filled 2011-04-04 (×2): qty 1
  Filled 2011-04-04: qty 2
  Filled 2011-04-04: qty 1
  Filled 2011-04-04 (×2): qty 2
  Filled 2011-04-04: qty 1
  Filled 2011-04-04 (×7): qty 2
  Filled 2011-04-04: qty 1
  Filled 2011-04-04 (×3): qty 2

## 2011-04-04 NOTE — Progress Notes (Signed)
UR Completed.  Rhonda Ramos [name] [phone] 04/04/2011  

## 2011-04-04 NOTE — Progress Notes (Addendum)
Subjective: Having a lot of pain at chest tube site.  Taking Dilaudid every 3 hours without relief.  Also, c/o itching and rash on RLE.  Was seen as OP and started on topical steroid cream, but this was not resumed on admission.  Breathing stable.   Objective: Vital signs in last 24 hours: Patient Vitals for the past 24 hrs:  BP Temp Temp src Pulse Resp SpO2  04/04/11 0330 136/85 mmHg 98.2 F (36.8 C) Oral 87  20  91 %  04/03/11 2009 117/79 mmHg 98 F (36.7 C) Oral 92  24  91 %  04/03/11 1801 115/79 mmHg 98 F (36.7 C) Oral 100  18  94 %  04/03/11 1730 105/75 mmHg - - 96  18  94 %  04/03/11 1700 97/66 mmHg - - 100  30  95 %  04/03/11 1650 103/73 mmHg 98 F (36.7 C) Oral 106  22  93 %  04/03/11 1630 103/73 mmHg - - 99  24  93 %  04/03/11 1600 103/76 mmHg - - 108  22  87 %  04/03/11 1515 140/82 mmHg 97.9 F (36.6 C) Oral 120  22  98 %  04/03/11 1500 163/82 mmHg - - 125  22  95 %  04/03/11 1456 - - - 126  20  96 %  04/03/11 1445 132/93 mmHg - - 138  34  97 %  04/03/11 1431 126/98 mmHg 97.8 F (36.6 C) Axillary 127  27  100 %  04/03/11 1430 134/92 mmHg - - 130  21  100 %  04/03/11 1428 - - - 129  26  100 %  04/03/11 1412 - - - - - 100 %   Current Weight  08/28/10 113 lb 1.9 oz (51.311 kg)     Intake/Output from previous day: 11/29 0701 - 11/30 0700 In: 543 [P.O.:540; I.V.:3] Out: 310 [Urine:300; Chest Tube:10]    PHYSICAL EXAM:  Heart: RRR Lungs: diminished BS right base Abdomen: soft, no tenderness to palpation, not distended  Chest tube no air leak  Lab Results: CBC: Basename 04/03/11 2023 04/03/11 1425  WBC 9.8 9.2  HGB 13.6 15.7*  HCT 40.5 45.2  PLT 298 350   BMET:  Basename 04/03/11 2023 04/03/11 1425  NA 140 138  K 3.5 3.6  CL 105 100  CO2 27 26  GLUCOSE 100* 98  BUN 13 11  CREATININE 0.91 0.86  CALCIUM 8.7 9.8    PT/INR:  Basename 04/03/11 1425  LABPROT 13.0  INR 0.96   Chest x-ray: no obvious ptx, but possible pneumomediastinum/free  air under diaphragm  Assessment/Plan: S/P  placement  R chest tube for spontaneous pneumothorax  Pain control is issue.  Ramos/O narcotic abuse.  Will avoid further narcotics and for now add Toradol. Resume steroid cream to leg.   Discussed CXR results with MD and XR reviewed. Will obtain CT chest and abdomen to evaluate possible free air.    LOS: 1 day    COLLINS,Rhonda Ramos 04/04/2011    Chart reviewed, patient examined, agree with above.  CXR today shows some pneumomediastinum and radiology thinks there may be some free air in abdomen.  This may be related to the initial pneumothorax.  She also has a lot of air distending the colon beneath the left hemidiaphragm. We have ordered CT of chest and abdomen as rec by radiology to further assess this.  She denies any abdominal pain and has been eating without problem.  Passing flatus.  Her only pain is in right upper back probably related to the chest tube.  There is no active air leak and I have not seen any air leak since the tube was inserted.

## 2011-04-05 ENCOUNTER — Inpatient Hospital Stay (HOSPITAL_COMMUNITY): Payer: Medicare Other

## 2011-04-05 MED ORDER — SODIUM CHLORIDE 0.9 % IJ SOLN: 3.0000 mL | INTRAMUSCULAR | Status: DC | PRN

## 2011-04-05 MED ORDER — LACTULOSE 10 GM/15ML PO SOLN
30.0000 g | Freq: Once | ORAL | Status: DC
  Filled 2011-04-05: qty 45

## 2011-04-05 NOTE — Progress Notes (Addendum)
   Subjective: Patient's pain under better control with Dilaudid and Toradol. Denies abdominal pain, N/V. No bowel movement but is passing flatus.  Objective: Vital signs in last 24 hours: Patient Vitals for the past 24 hrs:  BP Temp Temp src Pulse Resp SpO2 Weight  04/05/11 0507 130/84 mmHg 98.8 F (37.1 C) Oral 95  16  91 % -  04/04/11 2038 - - - - - - 113 lb 1.9 oz (51.31 kg)  04/04/11 1900 135/87 mmHg 98.4 F (36.9 C) Oral 97  22  91 % -  04/04/11 1412 132/93 mmHg 98.3 F (36.8 C) Oral 100  20  91 % -    Current Weight  04/04/11 113 lb 1.9 oz (51.31 kg)      Intake/Output from previous day: 11/30 0701 - 12/01 0700 In: 213 [P.O.:120; I.V.:93] Out: 980 [Urine:960; Chest Tube:20]   Physical Exam:  Cardiovascular: RRR Pulmonary: Clear to auscultation bilaterally; no rales, wheezes, or rhonchi. Abdomen: Soft, non tender, minor distention, bowel sounds present.   Lab Results: CBC: Basename 04/03/11 2023 04/03/11 1425  WBC 9.8 9.2  HGB 13.6 15.7*  HCT 40.5 45.2  PLT 298 350   BMET:  Basename 04/03/11 2023 04/03/11 1425  NA 140 138  K 3.5 3.6  CL 105 100  CO2 27 26  GLUCOSE 100* 98  BUN 13 11  CREATININE 0.91 0.86  CALCIUM 8.7 9.8    PT/INR:  Basename 04/03/11 1425  LABPROT 13.0  INR 0.96   ABG:  INR: Will add last result for INR, ABG once components are confirmed Will add last 4 CBG results once components are confirmed  Assessment/Plan:  1.  Pulmonary - Chest tube is on suction and there is no air leak.CXR not ordered this am. Will order.  If remains stable, probably place CT to waterseal. CAT scan of chest 11/30:bilateral blebs and bibasilar atx,thoracolumbar scoliosis, possible small ptx in posterior right hemithorax. 2.GI-CAT scan of abdomen 11/30: Negative for free intraperitoneal air,bilateral renal scarring/atrophy L>R. 3.LOC for constipation (patient with a history secondary to chronic pain syndrome/spina bifida)   ZIMMERMAN,[name] M,  PA 04/05/2011   I have seen and examined the patient and agree with the assessment and plan as outlined.  CXR okay. Will place tube to H2O seal  Jenny Lai H 04/05/2011 1:19 PM

## 2011-04-06 ENCOUNTER — Inpatient Hospital Stay (HOSPITAL_COMMUNITY): Payer: Medicare Other

## 2011-04-06 MED ORDER — THERA M PLUS PO TABS: 1.0000 | ORAL_TABLET | Freq: Every day | ORAL | Status: DC

## 2011-04-06 MED ORDER — TRAMADOL HCL 50 MG PO TABS: 50.0000 mg | ORAL_TABLET | ORAL | Status: DC | PRN

## 2011-04-06 MED ORDER — GUAIFENESIN-DM 100-10 MG/5ML PO SYRP: 5.0000 mL | ORAL_SOLUTION | ORAL | Status: AC | PRN

## 2011-04-06 NOTE — Progress Notes (Signed)
Physician Discharge Summary  Patient ID: KALYNNE WOMAC MRN: [id number] DOB/AGE: 44-Mar-1967 44 y.o.  Admit date: 04/03/2011 Discharge date: 04/07/2011  Admission Diagnoses:  1.Spontaneous right pneumothorax 2.History of chronic pain syndrome 3.History of spina bifida 4.History of depression 5.History of hypertension 6.History of GERD 7.History of scoliosis 8.History of anxiety  Discharge Diagnoses:   1.Spontaneous right pneumothorax 2.History of chronic pain syndrome 3.History of spina bifida 4.History of depression 5.History of hypertension 6.History of GERD 7.History of scoliosis 8.History of anxiety  Procedure (s): Right chest tube placement on 04/03/2011  History of Presenting Illness:  This is a 45 year old Caucasian female who began to experience right-sided chest pain and shortness of breath on 04/02/2011. The episode came on acutely with the primary symptom related to pain. This was mostly in the anterior chest and radiated into the shoulder. She additionally has a dry cough. She denies fever or or other constitutional symptoms. She presented to her primary physician today and a chest x-ray was obtained. She was discharged home but advised to present to the emergency room after results of the chest x-ray revealed that there was a large right-sided pneumothorax. There is approximately 90% volume loss. A cardiothoracic  consultation was obtained with Dr. Laneta Simmers who placed a chest tube. She was admitted for further observation and management.    Brief Hospital Course:   Patient has remained afebrile and vital signs stable. She had complaints of pain at her right chest tube site as well as a rash and itching on her right lower extremity.Apparently,  she was seen as an outpatient and started on topical steroid cream. This was resumed. Daily chest x-rays were obtained and remained stable. Her chest tube was not found to have an air leak. As a result, it was placed to water seal  on 04/05/2011. Chest x-ray done earlier this morning showed no pneumothorax, likely a right apical bleb, bibasilar atelectasis with low lung volumes. Her chest tube has been removed. Followup chest x-ray revealed no pneumothorax. Provided the patient remains afebrile, vital signs stable, and pending morning round evaluation, she will be surgically stable for discharge on 04/07/2011.  Filed Vitals:   04/06/11 0405  BP: 129/87  Pulse: 110  Temp: 98.4 F (36.9 C)  Resp: 20     Latest Vital Signs: Blood pressure 129/87, pulse 110, temperature 98.4 F (36.9 C), temperature source Oral, resp. rate 20, height 5' (1.524 m), weight 113 lb 1.9 oz (51.31 kg), SpO2 93.00%.  Physical Exam:  Cardiovascular: Regular rate and rhythm Pulmonary:slightly diminished at the bases bilaterally; no rales, wheezes, or rhonchi Abdomen: soft, nontender, minor distention, bowel sounds present  Discharge Condition:Stable  Recent laboratory studies:  Lab Results  Component Value Date   WBC 9.8 04/03/2011   HGB 13.6 04/03/2011   HCT 40.5 04/03/2011   MCV 94.4 04/03/2011   PLT 298 04/03/2011   Lab Results  Component Value Date   NA 140 04/03/2011   K 3.5 04/03/2011   CL 105 04/03/2011   CO2 27 04/03/2011   CREATININE 0.91 04/03/2011   GLUCOSE 100* 04/03/2011    Diagnostic Studies: Dg Chest 2 View  04/06/2011  *RADIOLOGY REPORT*  Clinical Data: Right spontaneous pneumothorax, right chest tube.  PORTABLE CHEST - 1 VIEW  Comparison: 04/05/2011  Findings: Right chest tube remains in place.  No visible right pneumothorax.  Air-filled structure in the medial right apex and adjacent to the trachea presumably represents a bleb, unchanged. There is bibasilar atelectasis with low lung volumes.  No effusions.  IMPRESSION: No visible pneumothorax.  Bibasilar atelectasis.  Original Report Authenticated By: Cyndie Chime, M.D.   Discharge Medications: Current Discharge Medication List    START taking these  medications   Details  guaiFENesin-dextromethorphan (ROBITUSSIN DM) 100-10 MG/5ML syrup Take 5 mLs by mouth every 4 (four) hours as needed for cough. Qty: 118 mL    Multiple Vitamins-Minerals (MULTIVITAMINS THER. W/MINERALS) TABS Take 1 tablet by mouth daily. Qty: 30 each      CONTINUE these medications which have CHANGED   Details  traMADol (ULTRAM) 50 MG tablet Take 1-2 tablets (50-100 mg total) by mouth every 4 (four) hours as needed. Qty: 45 tablet, Refills: 0   Associated Diagnoses: Chronic pain syndrome      CONTINUE these medications which have NOT CHANGED   Details  amLODipine (NORVASC) 5 MG tablet Take 1 tablet (5 mg total) by mouth daily. Qty: 30 tablet, Refills: 5    clonazePAM (KLONOPIN) 0.5 MG tablet take 1 tablet by mouth twice a day if needed for anxiety Qty: 60 tablet, Refills: 0    FLUoxetine (PROZAC) 20 MG tablet Take 3 tablets (60 mg total) by mouth daily. Take 3 by mouth once daily Qty: 90 tablet, Refills: 5    levETIRAcetam (KEPPRA) 250 MG tablet Take 250 mg by mouth daily.     lidocaine (XYLOCAINE JELLY) 2 % jelly Apply to affected area daily prn Qty: 30 mL, Refills: 1    norethindrone (CAMILA) 0.35 MG tablet Take 1 tablet (0.35 mg total) by mouth daily. Qty: 28 tablet, Refills: 11   Associated Diagnoses: Abnormal Pap smear of cervix    omeprazole (PRILOSEC) 20 MG capsule take 1 capsule by mouth once daily Qty: 30 capsule, Refills: 5    oxybutynin (DITROPAN-XL) 10 MG 24 hr tablet Take 1 tablet (10 mg total) by mouth daily. Qty: 30 tablet, Refills: 5        Follow Up Appointments: Follow-up Information    Follow up with Alleen Borne, MD. (PA/LAT CXR to be obtained 45 minutes prior to office appointment with Dr. Randa Ngo will contact patient with a follow up appointment date and time)    Contact information:   [address] [place] [address] [phone]          Signed: Ardelle Balls,  PA 04/06/2011, 1:18 PM

## 2011-04-06 NOTE — Progress Notes (Addendum)
   Subjective: Patient with bowel movement.  Is nervous about the possibility of chest tube being removed.  Objective: Vital signs in last 24 hours: Patient Vitals for the past 24 hrs:  BP Temp Temp src Pulse Resp SpO2  04/06/11 0405 129/87 mmHg 98.4 F (36.9 C) Oral 110  20  93 %  04/05/11 2126 130/82 mmHg 98.5 F (36.9 C) Oral 104  18  92 %  04/05/11 1423 136/95 mmHg 98.5 F (36.9 C) Oral 108  18  92 %    Current Weight  04/04/11 113 lb 1.9 oz (51.31 kg)      Intake/Output from previous day: 12/01 0701 - 12/02 0700 In: 720 [P.O.:720] Out: 1250 [Urine:1250]   Physical Exam:  Cardiovascular: RRR Pulmonary: Slightly diminished at bases; no rales, wheezes, or rhonchi. Abdomen: Soft, non tender, minor distention, bowel sounds present.   Lab Results: CBC:  Basename 04/03/11 2023 04/03/11 1425  WBC 9.8 9.2  HGB 13.6 15.7*  HCT 40.5 45.2  PLT 298 350   BMET:   Basename 04/03/11 2023 04/03/11 1425  NA 140 138  K 3.5 3.6  CL 105 100  CO2 27 26  GLUCOSE 100* 98  BUN 13 11  CREATININE 0.91 0.86  CALCIUM 8.7 9.8    PT/INR:   Basename 04/03/11 1425  LABPROT 13.0  INR 0.96   ABG:  INR: Will add last result for INR, ABG once components are confirmed Will add last 4 CBG results once components are confirmed  Assessment/Plan:  1.  Pulmonary - Chest tube placed to water seal 12/01. No air leak. No obvious pneumothorax.  Will await final results.  If stable, remove chest tube. 2.If CXR remains stable, probable discharge in am.   Ardelle Balls, PA 04/06/2011   ZIMMERMAN,[name] M 04/06/2011 8:53 AM   I have seen and examined the patient and agree with the assessment and plan as outlined.  OWEN,CLARENCE H 04/06/2011 12:53 PM

## 2011-04-06 NOTE — Progress Notes (Signed)
Chest tube discontinued per protocol 04/06/2011 At 12:38 PM Patient tolerated well. Vaseline occlusive dressing and gauze applied.  Yishai Rehfeld, Chrystine Oiler

## 2011-04-07 ENCOUNTER — Inpatient Hospital Stay (HOSPITAL_COMMUNITY): Payer: Medicare Other

## 2011-04-07 MED ORDER — OXYCODONE-ACETAMINOPHEN 10-325 MG PO TABS: 1.0000 | ORAL_TABLET | ORAL | Status: DC | PRN

## 2011-04-07 MED ORDER — OXYCODONE-ACETAMINOPHEN 10-325 MG PO TABS: 1.0000 | ORAL_TABLET | ORAL | Status: AC | PRN

## 2011-04-07 NOTE — Progress Notes (Addendum)
Subjective: Feeling better, c/o not sleeping well, desats to 70's when O2 off  Objective  Telemetry SR/ Stach   Temp:  [98.8 F (37.1 C)-100.1 F (37.8 C)] 98.8 F (37.1 C) (12/03 0447) Pulse Rate:  [97-112] 97  (12/03 0447) Resp:  [18] 18  (12/03 0447) BP: (119-124)/(78-88) 119/78 mmHg (12/03 0447) SpO2:  [78 %-90 %] 90 % (12/03 0447)   Intake/Output Summary (Last 24 hours) at 04/07/11 0806 Last data filed at 04/06/11 2003  Gross per 24 hour  Intake    720 ml  Output      0 ml  Net    720 ml       General appearance: alert, cooperative and no distress Heart: RRR, Tachy Lungs: clear to auscultation bilaterally Abdomen: soft, non-tender; bowel sounds normal; no masses,  no organomegaly  Lab Results: No results found for this basename: NA:2,K:2,CL:2,CO2:2,GLUCOSE:2,BUN:2,CREATININE:2,CALCIUM:2,MG:2,PHOS:2 in the last 72 hours No results found for this basename: AST:2,ALT:2,ALKPHOS:2,BILITOT:2,PROT:2,ALBUMIN:2 in the last 72 hours No results found for this basename: LIPASE:2,AMYLASE:2 in the last 72 hours No results found for this basename: WBC:2,NEUTROABS:2,HGB:2,HCT:2,MCV:2,PLT:2 in the last 72 hours No results found for this basename: CKTOTAL:4,CKMB:4,TROPONINI:4 in the last 72 hours No results found for this basename: POCBNP:3 in the last 72 hours No results found for this basename: DDIMER in the last 72 hours No results found for this basename: HGBA1C in the last 72 hours No results found for this basename: CHOL,HDL,LDLCALC,TRIG,CHOLHDL in the last 72 hours No results found for this basename: TSH,T4TOTAL,FREET3,T3FREE,THYROIDAB in the last 72 hours No results found for this basename: VITAMINB12,FOLATE,FERRITIN,TIBC,IRON,RETICCTPCT in the last 72 hours  Medications: Scheduled    . amLODipine  5 mg Oral Daily  . docusate sodium  100 mg Oral BID  . FLUoxetine  60 mg Oral Daily  . ketorolac  15 mg Intravenous Q6H  . lactulose  30 g Oral Once  . levETIRAcetam   250 mg Oral Daily  . multivitamins ther. w/minerals  1 tablet Oral Daily  . norethindrone  1 tablet Oral Daily  . oxybutynin  10 mg Oral Daily  . pantoprazole  40 mg Oral Q1200  . sodium chloride  3 mL Intravenous Q12H  . triamcinolone cream   Topical BID     Radiology/Studies:  Dg Chest 2 View  04/07/2011  *RADIOLOGY REPORT*  Clinical Data: Status post chest tube removal.  Spontaneous pneumothorax.  CHEST - 2 VIEW  Comparison: Plain films of the chest 04/06/2011 and 04/05/2011.  Findings: No pneumothorax is identified.  Lung volumes are low with basilar airspace opacity, worse on the left.  Heart size is normal. No pleural fluid.  IMPRESSION: No change in left worse than right basilar airspace disease.  No pneumothorax.  Original Report Authenticated By: Bernadene Bell. Maricela Curet, M.D.   Dg Chest Port 1 View  04/06/2011  *RADIOLOGY REPORT*  Clinical Data: Chest tube removal.  Chest pain.  PORTABLE CHEST - 1 VIEW  Comparison: 04/06/2011  Findings: Interval removal of right chest tube without visible pneumothorax.  Very low lung volumes with bibasilar atelectasis or infiltrates.  Heart is likely normal size.  No effusions.  IMPRESSION: Interval removal of the right chest tube without pneumothorax. Continued low volumes with bibasilar atelectasis.  Original Report Authenticated By: Cyndie Chime, M.D.   Dg Chest Port 1 View  04/06/2011  *RADIOLOGY REPORT*  Clinical Data: Right spontaneous pneumothorax, right chest tube.  PORTABLE CHEST - 1 VIEW  Comparison: 04/05/2011  Findings: Right chest tube remains in place.  No visible right pneumothorax.  Air-filled structure in the medial right apex and adjacent to the trachea presumably represents a bleb, unchanged. There is bibasilar atelectasis with low lung volumes.  No effusions.  IMPRESSION: No visible pneumothorax.  Bibasilar atelectasis.  Original Report Authenticated By: Cyndie Chime, M.D.   Dg Chest Port 1 View  04/05/2011  *RADIOLOGY REPORT*   Clinical Data: Right spontaneous pneumothorax  PORTABLE CHEST - 1 VIEW  Comparison: CT 04/04/2011, plain film 04/04/2011  Findings: There is a chest tube in the right lung.  No evidence of pneumothorax.  There are low lung volumes.  There is atelectasis in the left lower lobe.  This also unchanged.  There is a bleb at the right lung apex which extends into the thoracic inlet.  IMPRESSION:  1.  No significant change.  2.  No evidence of significant residual pneumothorax with right chest tube in place. 3.  Bibasilar atelectasis. .  Original Report Authenticated By: Genevive Bi, M.D.    INR: Will add last result for INR, ABG once components are confirmed Will add last 4 CBG results once components are confirmed  Assessment/Plan: S/P Chest tube for spont pntx. Desats off O2, will attempt to wean today.CXR stable    LOS: 4 days    GOLD,WAYNE E 12/3/20128:06 AM   She has been walking in hall today with sats 90% on room air.  She can be discharged.

## 2011-04-07 NOTE — Progress Notes (Signed)
   CARE MANAGEMENT NOTE 04/07/2011  Patient:  Rhonda Ramos, Rhonda Ramos   Account Number:  [id number]  Date Initiated:  04/07/2011  Documentation initiated by:  Kena Limon  Subjective/Objective Assessment:   PT ADM WITH SPONTANEOUS PTX REQUIRING CHEST TUBE PLACEMENT. PTA, PT INDEPENDENT, LIVES AT HOME ALONE.  SHE OWNS A CANE, WALKER, AND A HOVER ROUND.     Action/Plan:   MET WITH PT TO DISCUSS DISCHARGE PLANS.  PT STATES SHE HAS HELP FROM FAMILY MEMBERS IF NEEDED.  SHE DENIES ANY NEED FOR HOME HEALTH.   Anticipated DC Date:  04/08/2011   Anticipated DC Plan:  HOME/SELF CARE      DC Planning Services  CM consult      Choice offered to / List presented to:             Status of service:  In process, will continue to follow Medicare Important Message given?   (If response is "NO", the following Medicare IM given date fields will be blank) Date Medicare IM given:   Date Additional Medicare IM given:    Discharge Disposition:    Per UR Regulation:    Comments:  04/07/11 Carleen Rhue,RN,BSN 1045 PT AMBULATED ON ROOM AIR THIS AM WITH NURSE TECH.  NT REPORTED O2 SATS 89-93% WITH AMBULATION ON ROOM AIR.   Jerrell Belfast, RN, BSN Phone #[phone]

## 2011-04-09 ENCOUNTER — Telehealth: Payer: Self-pay | Admitting: *Deleted

## 2011-04-09 NOTE — Telephone Encounter (Signed)
Left message for pt to callback office.  

## 2011-04-09 NOTE — Telephone Encounter (Signed)
Pt is requesting callback from MD if possible, she has questions regarding medications she was given at OV before she was hospitalized. Pt wants to know if she should still take Prednisone and Buspar that was prescribed to her. She also wants MD to know that she did not refill Hydrocodone cough syrup because she was given pain medication when she was discharged from hospital and did not want to take cough syrup with pain meds.

## 2011-04-09 NOTE — Telephone Encounter (Signed)
None of the prescriptions prescribed by me at the office visit prior to hospitalization should be filled or taken. Takes only the medications as prescribed by the hospital discharging team and those taken prior to her illness. Thanks

## 2011-04-09 NOTE — Telephone Encounter (Signed)
Pt left message requesting callback regarding medication given at last OV-left message for pt to callback office.

## 2011-04-10 ENCOUNTER — Other Ambulatory Visit: Payer: Self-pay | Admitting: Surgery

## 2011-04-10 DIAGNOSIS — J930 Spontaneous tension pneumothorax: Secondary | ICD-10-CM

## 2011-04-10 NOTE — Telephone Encounter (Signed)
Notified pt with md response...04/10/11@2 :24pm/LMB

## 2011-04-15 ENCOUNTER — Encounter: Payer: Self-pay | Admitting: Surgery

## 2011-04-15 ENCOUNTER — Ambulatory Visit (INDEPENDENT_AMBULATORY_CARE_PROVIDER_SITE_OTHER): Payer: Medicare Other | Admitting: Surgery

## 2011-04-15 ENCOUNTER — Ambulatory Visit
Admission: RE | Admit: 2011-04-15 | Discharge: 2011-04-15 | Disposition: A | Discharge status: Home / Self Care | Payer: Medicare Other | Source: Ambulatory Visit | Attending: Surgery | Admitting: Surgery

## 2011-04-15 DIAGNOSIS — J930 Spontaneous tension pneumothorax: Secondary | ICD-10-CM

## 2011-04-15 DIAGNOSIS — J9383 Other pneumothorax: Secondary | ICD-10-CM

## 2011-04-15 NOTE — Progress Notes (Signed)
[address]            [name] [address]          [phone]      HPI:  The patient returns today for followup status post treatment of a right spontaneous pneumothorax with a chest tube. She presented with about 70% collapse of her right lung, in some respiratory distress. After chest tube insertion she did not have an active airleak and the tube was removed after 2 days. We did do a CT scan of the chest and abdomen in the hospital because the radiologist thought that she may have some pneumomediastinum and free air under her diaphragm. 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 She did not have any free air under her diaphragm. Since discharge she said she has been feeling much better. She has minimal chest discomfort and said she is not taking any more pain medicine. She said that her breathing is back to her baseline.  Current Outpatient Prescriptions  Medication Sig Dispense Refill  . amLODipine (NORVASC) 5 MG tablet Take 1 tablet (5 mg total) by mouth daily.  30 tablet  5  . clonazePAM (KLONOPIN) 0.5 MG tablet take 1 tablet by mouth twice a day if needed for anxiety  60 tablet  0  . FLUoxetine (PROZAC) 20 MG tablet Take 3 tablets (60 mg total) by mouth daily. Take 3 by mouth once daily  90 tablet  5  . guaiFENesin-dextromethorphan (ROBITUSSIN DM) 100-10 MG/5ML syrup Take 5 mLs by mouth every 4 (four) hours as needed for cough.  118 mL    . levETIRAcetam (KEPPRA) 250 MG tablet Take 250 mg by mouth daily.       Marland Kitchen lidocaine (XYLOCAINE JELLY) 2 % jelly Apply to affected area daily prn  30 mL  1  . Multiple Vitamins-Minerals (MULTIVITAMINS THER. W/MINERALS) TABS Take 1 tablet by mouth daily.  30 each    . norethindrone (CAMILA) 0.35 MG tablet Take 1 tablet (0.35 mg total) by mouth daily.  28 tablet  11  . omeprazole (PRILOSEC) 20 MG capsule take 1 capsule by mouth once  daily  30 capsule  5  . oxybutynin (DITROPAN-XL) 10 MG 24 hr tablet Take 1 tablet (10 mg total) by mouth daily.  30 tablet  5  . oxyCODONE-acetaminophen (PERCOCET) 10-325 MG per tablet Take 1 tablet by mouth every 4 (four) hours as needed for pain.  30 tablet  0     Physical Exam: BP 152/90  Pulse 118  Resp 16  Ht 4\' 10"  (1.473 m)  Wt 110 lb (49.896 kg)  BMI 22.99 kg/m2  SpO2 98%  She looks well. Lung exam reveals clear breath sounds bilaterally. The right chest tube site is healing well. The suture was removed. There is mild skin irritation secondary to the tape.  Diagnostic Tests:  Chest x-ray today shows extensive bullous emphysema. There is no pneumothorax or effusion.  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 I explained her that there is a 20% chance that this will recur within the next 2 years. 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  Plan:  She will contact my office if she develops any recurrent chest pain or shortness  of breath symptoms so that we can repeat her chest x-ray. If it is after 5:00 in the afternoon she will come to the emergency room.

## 2011-04-17 ENCOUNTER — Other Ambulatory Visit: Payer: Self-pay | Admitting: Internal Medicine

## 2011-04-17 NOTE — Telephone Encounter (Signed)
Done hardcopy to robin  

## 2011-04-17 NOTE — Telephone Encounter (Signed)
MD is out of office this pm Is it ok to refill clonazepam...04/17/11@2 :37pm/LMB

## 2011-04-18 NOTE — Telephone Encounter (Signed)
Faxed hardcopy to pharmacy. 

## 2011-05-08 NOTE — Discharge Summary (Signed)
Addendum Note to D/C [name] is a 46 y.o. female who is S/P right chest tube placement .  CXR done 05/08/2011 showed no pneumothorax, low lung volumes, bibasilar airspace disease L>R.The patient is stable and ready for discharge.  No changes to History, Physical Exam, Meds or D/C instructions  Ardelle Balls PA-C 05/08/2011 11:57 AM

## 2011-05-14 ENCOUNTER — Other Ambulatory Visit: Payer: Self-pay | Admitting: Internal Medicine

## 2011-05-14 NOTE — Telephone Encounter (Signed)
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

## 2011-05-15 NOTE — Telephone Encounter (Signed)
Pt is also stating that rash on her legs have returned and was previously Rx'd prednisone. Pt was Rx'd prednisone at last OV but did not take and would like to know if it would be okay to take now to treat rash, please advise.

## 2011-05-15 NOTE — Telephone Encounter (Signed)
Okay for refill. Okay to generate letter - see problem list for her disabilities

## 2011-05-16 ENCOUNTER — Ambulatory Visit: Payer: Medicare Other | Admitting: Internal Medicine

## 2011-05-16 ENCOUNTER — Telehealth: Payer: Self-pay | Admitting: *Deleted

## 2011-05-16 ENCOUNTER — Other Ambulatory Visit: Payer: Self-pay | Admitting: *Deleted

## 2011-05-16 MED ORDER — CLONAZEPAM 0.5 MG PO TABS: 0.5000 mg | ORAL_TABLET | Freq: Two times a day (BID) | ORAL | Status: DC | PRN

## 2011-05-16 NOTE — Telephone Encounter (Signed)
Pt has been notified, and letter has been faxed.Marland KitchenMarland Kitchen1/11/13@ 2:13pm/LMB

## 2011-05-16 NOTE — Telephone Encounter (Signed)
Faxed script back to [facility].Marland KitchenMarland Kitchen1/11/13@1 :13pm/LMB

## 2011-05-16 NOTE — Telephone Encounter (Signed)
Yes - ok to take pred

## 2011-05-16 NOTE — Telephone Encounter (Signed)
Called pt concerning rash md ok for her to take prednisone for rash. Also pt is needing letter to go to [facility] fax to [phone]. Pt states just need letter to states that she is on disability due to her scoliosis, and that Dr. Felicity Coyer is her md. Inform pt will generate letter and fax to [place].Marland KitchenMarland Kitchen1/11/13@1 :32pm/LMB

## 2011-05-20 ENCOUNTER — Other Ambulatory Visit: Payer: Self-pay | Admitting: Internal Medicine

## 2011-05-21 ENCOUNTER — Other Ambulatory Visit: Payer: Self-pay | Admitting: Internal Medicine

## 2011-05-21 MED ORDER — CIPROFLOXACIN HCL 500 MG PO TABS: 500.0000 mg | ORAL_TABLET | Freq: Two times a day (BID) | ORAL | Status: AC

## 2011-05-21 NOTE — Telephone Encounter (Signed)
New 7d rx done - please remind pt not to share her meds with others - thanks

## 2011-05-21 NOTE — Telephone Encounter (Signed)
Pt is requesting Cipro or something like it.  She thinks she has a kidney infection.  She had some, but gave it to her neighbor. She would like it as soon as possible.

## 2011-05-22 NOTE — Telephone Encounter (Signed)
Called pt no answer LMOM rx sent to [facility].Marland KitchenMarland Kitchen1/17/13@9 :33am/LMB

## 2011-06-10 ENCOUNTER — Emergency Department (HOSPITAL_COMMUNITY): Payer: Medicare Other

## 2011-06-10 ENCOUNTER — Other Ambulatory Visit: Payer: Self-pay

## 2011-06-10 ENCOUNTER — Encounter (HOSPITAL_COMMUNITY): Payer: Self-pay | Admitting: Emergency Medicine

## 2011-06-10 ENCOUNTER — Telehealth: Payer: Self-pay | Admitting: Internal Medicine

## 2011-06-10 ENCOUNTER — Ambulatory Visit (INDEPENDENT_AMBULATORY_CARE_PROVIDER_SITE_OTHER)
Admission: RE | Admit: 2011-06-10 | Discharge: 2011-06-10 | Disposition: A | Discharge status: Home / Self Care | Payer: Medicare Other | Source: Ambulatory Visit | Attending: Internal Medicine | Admitting: Internal Medicine

## 2011-06-10 ENCOUNTER — Inpatient Hospital Stay (HOSPITAL_COMMUNITY)
Admission: EM | Admit: 2011-06-10 | Discharge: 2011-06-18 | DRG: 163 | Disposition: A | Discharge status: Home / Self Care | Payer: Medicare Other | Source: Ambulatory Visit | Attending: Cardiothoracic Surgery | Admitting: Cardiothoracic Surgery

## 2011-06-10 ENCOUNTER — Telehealth: Payer: Self-pay | Admitting: *Deleted

## 2011-06-10 DIAGNOSIS — K219 Gastro-esophageal reflux disease without esophagitis: Secondary | ICD-10-CM | POA: Diagnosis present

## 2011-06-10 DIAGNOSIS — F4310 Post-traumatic stress disorder, unspecified: Secondary | ICD-10-CM | POA: Diagnosis present

## 2011-06-10 DIAGNOSIS — Z8709 Personal history of other diseases of the respiratory system: Secondary | ICD-10-CM

## 2011-06-10 DIAGNOSIS — N189 Chronic kidney disease, unspecified: Secondary | ICD-10-CM | POA: Diagnosis present

## 2011-06-10 DIAGNOSIS — F411 Generalized anxiety disorder: Secondary | ICD-10-CM | POA: Diagnosis present

## 2011-06-10 DIAGNOSIS — I129 Hypertensive chronic kidney disease with stage 1 through stage 4 chronic kidney disease, or unspecified chronic kidney disease: Secondary | ICD-10-CM | POA: Diagnosis present

## 2011-06-10 DIAGNOSIS — Z91040 Latex allergy status: Secondary | ICD-10-CM

## 2011-06-10 DIAGNOSIS — R000 Tachycardia, unspecified: Secondary | ICD-10-CM | POA: Diagnosis not present

## 2011-06-10 DIAGNOSIS — G894 Chronic pain syndrome: Secondary | ICD-10-CM | POA: Diagnosis present

## 2011-06-10 DIAGNOSIS — J930 Spontaneous tension pneumothorax: Secondary | ICD-10-CM

## 2011-06-10 DIAGNOSIS — F3289 Other specified depressive episodes: Secondary | ICD-10-CM | POA: Diagnosis present

## 2011-06-10 DIAGNOSIS — M549 Dorsalgia, unspecified: Secondary | ICD-10-CM | POA: Diagnosis present

## 2011-06-10 DIAGNOSIS — J9819 Other pulmonary collapse: Secondary | ICD-10-CM | POA: Diagnosis present

## 2011-06-10 DIAGNOSIS — G40909 Epilepsy, unspecified, not intractable, without status epilepticus: Secondary | ICD-10-CM | POA: Diagnosis present

## 2011-06-10 DIAGNOSIS — R0609 Other forms of dyspnea: Secondary | ICD-10-CM

## 2011-06-10 DIAGNOSIS — Z886 Allergy status to analgesic agent status: Secondary | ICD-10-CM

## 2011-06-10 DIAGNOSIS — M129 Arthropathy, unspecified: Secondary | ICD-10-CM | POA: Diagnosis present

## 2011-06-10 DIAGNOSIS — R87619 Unspecified abnormal cytological findings in specimens from cervix uteri: Secondary | ICD-10-CM

## 2011-06-10 DIAGNOSIS — R0902 Hypoxemia: Secondary | ICD-10-CM | POA: Diagnosis not present

## 2011-06-10 DIAGNOSIS — Z981 Arthrodesis status: Secondary | ICD-10-CM

## 2011-06-10 DIAGNOSIS — F329 Major depressive disorder, single episode, unspecified: Secondary | ICD-10-CM | POA: Diagnosis present

## 2011-06-10 DIAGNOSIS — R0600 Dyspnea, unspecified: Secondary | ICD-10-CM

## 2011-06-10 DIAGNOSIS — J439 Emphysema, unspecified: Secondary | ICD-10-CM | POA: Diagnosis present

## 2011-06-10 DIAGNOSIS — J982 Interstitial emphysema: Secondary | ICD-10-CM | POA: Diagnosis present

## 2011-06-10 DIAGNOSIS — F17200 Nicotine dependence, unspecified, uncomplicated: Secondary | ICD-10-CM | POA: Diagnosis present

## 2011-06-10 DIAGNOSIS — Z79899 Other long term (current) drug therapy: Secondary | ICD-10-CM

## 2011-06-10 DIAGNOSIS — J9383 Other pneumothorax: Principal | ICD-10-CM | POA: Diagnosis present

## 2011-06-10 DIAGNOSIS — Z881 Allergy status to other antibiotic agents status: Secondary | ICD-10-CM

## 2011-06-10 DIAGNOSIS — J9600 Acute respiratory failure, unspecified whether with hypoxia or hypercapnia: Secondary | ICD-10-CM | POA: Diagnosis not present

## 2011-06-10 DIAGNOSIS — J40 Bronchitis, not specified as acute or chronic: Secondary | ICD-10-CM | POA: Diagnosis not present

## 2011-06-10 DIAGNOSIS — Z9981 Dependence on supplemental oxygen: Secondary | ICD-10-CM

## 2011-06-10 DIAGNOSIS — Z87440 Personal history of urinary (tract) infections: Secondary | ICD-10-CM

## 2011-06-10 DIAGNOSIS — R0989 Other specified symptoms and signs involving the circulatory and respiratory systems: Secondary | ICD-10-CM

## 2011-06-10 DIAGNOSIS — Q059 Spina bifida, unspecified: Secondary | ICD-10-CM

## 2011-06-10 DIAGNOSIS — M4120 Other idiopathic scoliosis, site unspecified: Secondary | ICD-10-CM | POA: Diagnosis present

## 2011-06-10 MED ORDER — CLONAZEPAM 0.5 MG PO TABS
0.5000 mg | ORAL_TABLET | Freq: Two times a day (BID) | ORAL | Status: DC
  Administered 2011-06-10 – 2011-06-18 (×15): 0.5 mg via ORAL
  Filled 2011-06-10 (×15): qty 1

## 2011-06-10 MED ORDER — FENTANYL CITRATE 0.05 MG/ML IJ SOLN
50.0000 ug | Freq: Once | INTRAMUSCULAR | Status: AC
  Administered 2011-06-10: 50 ug via INTRAVENOUS
  Filled 2011-06-10: qty 2

## 2011-06-10 MED ORDER — FENTANYL CITRATE 0.05 MG/ML IJ SOLN
25.0000 ug | Freq: Once | INTRAMUSCULAR | Status: AC
  Administered 2011-06-10: 25 ug via INTRAVENOUS
  Filled 2011-06-10: qty 2

## 2011-06-10 MED ORDER — LORAZEPAM 2 MG/ML IJ SOLN
2.0000 mg | Freq: Once | INTRAMUSCULAR | Status: AC
  Administered 2011-06-10: 2 mg via INTRAVENOUS

## 2011-06-10 MED ORDER — NITROGLYCERIN 0.4 MG SL SUBL: 0.4000 mg | SUBLINGUAL_TABLET | SUBLINGUAL | Status: DC | PRN

## 2011-06-10 MED ORDER — OXYCODONE HCL 5 MG PO TABS
5.0000 mg | ORAL_TABLET | ORAL | Status: DC | PRN
  Administered 2011-06-10 – 2011-06-11 (×3): 5 mg via ORAL
  Filled 2011-06-10 (×3): qty 1

## 2011-06-10 MED ORDER — SODIUM CHLORIDE 0.9 % IV SOLN
INTRAVENOUS | Status: DC
  Administered 2011-06-10 (×3): via INTRAVENOUS

## 2011-06-10 MED ORDER — DIPHENHYDRAMINE HCL 50 MG/ML IJ SOLN: 25.0000 mg | Freq: Once | INTRAMUSCULAR | Status: DC

## 2011-06-10 MED ORDER — DIPHENHYDRAMINE HCL 50 MG/ML IJ SOLN
25.0000 mg | Freq: Once | INTRAMUSCULAR | Status: AC
  Administered 2011-06-10: 25 mg via INTRAVENOUS
  Filled 2011-06-10: qty 1

## 2011-06-10 MED ORDER — LORAZEPAM 2 MG/ML IJ SOLN
2.0000 mg | Freq: Once | INTRAMUSCULAR | Status: AC
  Administered 2011-06-10: 2 mg via INTRAVENOUS
  Filled 2011-06-10 (×2): qty 1

## 2011-06-10 MED ORDER — FENTANYL CITRATE 0.05 MG/ML IJ SOLN
50.0000 ug | Freq: Once | INTRAMUSCULAR | Status: AC
  Administered 2011-06-10: 50 ug via INTRAVENOUS

## 2011-06-10 MED ORDER — FENTANYL CITRATE 0.05 MG/ML IJ SOLN
INTRAMUSCULAR | Status: AC
  Filled 2011-06-10: qty 2

## 2011-06-10 MED ORDER — HYDROMORPHONE HCL PF 2 MG/ML IJ SOLN
INTRAMUSCULAR | Status: AC
  Filled 2011-06-10: qty 1

## 2011-06-10 MED ORDER — HYDROMORPHONE HCL PF 2 MG/ML IJ SOLN
2.0000 mg | Freq: Once | INTRAMUSCULAR | Status: AC
  Administered 2011-06-10: 2 mg via INTRAVENOUS

## 2011-06-10 MED ORDER — TRAMADOL HCL 50 MG PO TABS: 100.0000 mg | ORAL_TABLET | Freq: Four times a day (QID) | ORAL | Status: DC | PRN

## 2011-06-10 NOTE — H&P (Signed)
Rhonda Ramos [facility] Medical Record #[id number] Date of Birth: January 03, 1966  Referring: No ref. provider found Primary Care: Rene Paci, MD, MD  Chief Complaint:    Chief Complaint  Patient presents with  . Shortness of Breath    collapsed RT lung confirmed by PMD and sent here.      Chief Complaint :Right sided chest pain with shortness of breath Chest tube placed by ER MD and patient transferred to [facility]  HPI: The patient began to experience right-sided chest pain and shortness of breath today. The episode came on acutely with the primary symptom related to pain. This was mostly in the anterior chest and radiated into the shoulder. She additionally has a dry cough. She denies fever or or other constitutional symptoms. She presented to her primary physician today and a chest x-ray was obtained. She was  advised to present to the emergency room after results of the chest x-ray revealed that there was a large right-sided pneumothorax. There is approximately 90% volume loss. A Chest tube was placed  Then a Cardiothoracic surgical consultation was obtained. Similar epidsode occurred on the right and a chest tube was placed by Dr Laneta Simmers in Nov 2012.  She is now to be admitted for further observation and management.  Past Medical History  Diagnosis Date  . Chronic pain syndrome     s/p rehab - narc detox fall 2011  . SPINA BIFIDA   . Scoliosis     multiple surg for same  . DEPRESSION     > ptsd (spouse suicide/gsw 11/2005  . GERD   . HYPERTENSION   . Anxiety   . Arthritis   . Pneumothorax on right 04/03/11    large  . Seizures     onset fall 2011 with narc detox  . Chronic back pain greater than 3 months duration   . Heart murmur     "in my mid teens"  . Bronchitis     h/o  . Pneumonia     h/o  . Chronic kidney disease     "left kidney is ~ 25 cent sized; self caths"  . Headache   . UTI (lower urinary tract infection)     Past Surgical History    Procedure Date  . Lumbar laminectomy for tethered cord release     x's 4 (84,94,98, and 02)  . 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 Scoliosis 1982    neck fused w/bones  . Chest tube placement 04/03/11    S/P large right sided pneumo  . Back surgery     Family History  Problem Relation Age of Onset  . Diabetes Father   . Arthritis Other     parent & grandparent  . Diabetes Other     grandparent   Social History:  reports that she has been smoking Cigarettes.  She has a 81 pack-year smoking history. She has never used smokeless tobacco. She reports that she uses illicit drugs (Other-see comments). She reports that she does not drink alcohol.  Allergies:  Allergies  Allergen Reactions  . Latex Shortness Of Breath  . Codeine Nausea And Vomiting  . Erythromycin Nausea And Vomiting    Medications Prior to Admission  Medication Dose Route Frequency Provider Last Rate Last Dose  . 0.9 %  sodium chloride infusion   Intravenous Continuous Nicholes Stairs, MD 125 mL/hr at 06/10/11 1800    . diphenhydrAMINE (BENADRYL) injection 25 mg  25 mg Intravenous Once Nicholes Stairs, MD   25 mg at 06/10/11 1948  . fentaNYL (SUBLIMAZE) injection 25 mcg  25 mcg Intravenous Once Nicholes Stairs, MD   25 mcg at 06/10/11 1948  . fentaNYL (SUBLIMAZE) injection 50 mcg  50 mcg Intravenous Once Nicholes Stairs, MD   50 mcg at 06/10/11 1752  . fentaNYL (SUBLIMAZE) injection 50 mcg  50 mcg Intravenous Once Nicholes Stairs, MD   50 mcg at 06/10/11 1759  . HYDROmorphone (DILAUDID) injection 2 mg  2 mg Intravenous Once Nicholes Stairs, MD   2 mg at 06/10/11 1815  . LORazepam (ATIVAN) injection 2 mg  2 mg Intravenous Once Nicholes Stairs, MD   2 mg at 06/10/11 1740  . LORazepam (ATIVAN) injection 2 mg  2 mg Intravenous Once Nicholes Stairs, MD   2 mg at 06/10/11 1805  . DISCONTD: diphenhydrAMINE (BENADRYL) injection 25 mg  25 mg Intravenous Once Nicholes Stairs, MD       . DISCONTD: nitroGLYCERIN (NITROSTAT) SL tablet 0.4 mg  0.4 mg Sublingual Q5 min PRN Nicholes Stairs, MD       Medications Prior to Admission  Medication Sig Dispense Refill  . amLODipine (NORVASC) 5 MG tablet take 1 tablet by mouth once daily  30 tablet  5  . clonazePAM (KLONOPIN) 0.5 MG tablet Take 1 tablet (0.5 mg total) by mouth 2 (two) times daily as needed for anxiety.  60 tablet  1  . FLUoxetine (PROZAC) 20 MG tablet Take 3 tablets (60 mg total) by mouth daily. Take 3 by mouth once daily  90 tablet  5  . levETIRAcetam (KEPPRA) 250 MG tablet Take 250 mg by mouth daily.       . Multiple Vitamins-Minerals (MULTIVITAMINS THER. W/MINERALS) TABS Take 1 tablet by mouth daily.  30 each    . norethindrone (CAMILA) 0.35 MG tablet Take 1 tablet (0.35 mg total) by mouth daily.  28 tablet  11  . omeprazole (PRILOSEC) 20 MG capsule take 1 capsule by mouth once daily  30 capsule  5  . oxybutynin (DITROPAN-XL) 10 MG 24 hr tablet take 1 tablet by mouth once daily  30 tablet  5    No results found for this or any previous visit (from the past 48 hour(s)). Dg Chest 2 View  06/10/2011  *RADIOLOGY REPORT*  Clinical Data: History of pneumothorax in November.  Shortness of breath.  Ex-smoker.  History of hypertension.  Right-sided chest pain.  CHEST - 2 VIEW  Comparison: Chest x-ray 04/15/2011.  Findings: Large right-sided hydropneumothorax (greater than 90% of lung volume loss, small effusion component).  Interstitial prominence throughout the left lung.  Heart size is normal. Mediastinal contours are unremarkable (specifically, no shift of mediastinal contents to suggest a tension pneumothorax at this time).  IMPRESSION:  1.  Large right-sided hydropneumothorax, as above.  Critical Value/emergent results were called by telephone at the time of interpretation on 06/10/2011  at 04:55 p.m.  to  Dr. Everardo All, who verbally acknowledged these results.  Original Report Authenticated By: Florencia Reasons, M.D.    Dg Chest Portable 1 View  06/10/2011  *RADIOLOGY REPORT*  Clinical Data: Shortness of breath, pneumothorax, post chest tube placement  PORTABLE CHEST - 1 VIEW  Comparison: Earlier film of the same day  Findings: A right chest tube is in place, directed towards the hilum.  There has been interval evacuation of the previously noted pneumothorax.  There is some residual  patchy atelectasis or consolidation  at the right lung base.  Coarse perihilar interstitial markings as before.  Heart size within normal limits. 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  Cannot exclude small right pleural effusion.  IMPRESSION:  1.  Right chest tube placement with evacuation of pneumothorax. 2.  Patchy atelectasis or infiltrate at the right lung base.  The findings were reviewed with Dr. Weldon Inches on the phone at the time of interpretation.  Original Report Authenticated By: Osa Craver, M.D.   Dg Chest Port 1 View  06/10/2011  *RADIOLOGY REPORT*  Clinical Data: Increased shortness of breath.  PORTABLE CHEST - 1 VIEW  Comparison: Portable film 1 hour earlier.  Findings: Large right pneumothorax persists, greater than 90% lung volume loss.  Small effusion component not well visualized on this film.  Little change from film 1 hour earlier.   No mediastinal shift or tension component. Mild basilar atelectasis left lung.  IMPRESSION: Large right pneumothorax.  Findings discussed with Dr. Weldon Inches.  Original Report Authenticated By: Elsie Stain, M.D.   Review of Systems - General ROS: right shoulder pain Psychological ROS: positive for - anxiety, depression and memory difficulties Ophthalmic HYQ:MVHQIONG ENT ROS: negative Respiratory ROS: positive for - cough, pleuritic pain and shortness of breath Cardiovascular ROS: positive for -   rapid heart rate Gastrointestinal ROS: positive for - constipation  Genito-Urinary ROS: positive for - incontinence and self  catheterizes Musculoskeletal ROS: positive for - gait disturbance, joint pain, joint stiffness, muscle pain and muscular weakness Neurological ROS: positive for - bowel and bladder control changes, gait disturbance, headaches, impaired coordination/balance, numbness/tingling, seizures and weakness  Blood pressure 132/89, pulse 125, temperature 98.8 F (37.1 C), temperature source Oral, resp. rate 24, SpO2 93.00%.   BP 132/89  Pulse 125  Temp(Src) 98.8 F (37.1 C) (Oral)  Resp 24  SpO2 93%  General Appearance:    Alert, cooperative, no distress, appears older than stated age  Head:    Normocephalic, without obvious abnormality, atraumatic  Eyes:    PERRL, conjunctiva/corneas clear, EOM's intact,   Ears:    Normal TM's and external ear canals, both ears  Nose:   Nares normal, septum midline, mucosa normal, no drainage    or sinus tenderness  Throat:   Lips, mucosa, and tongue normal; edentulous  Neck:   Supple, symmetrical, trachea midline, no adenopathy;    thyroid:  no enlargement/tenderness/nodules; no  JVD  Back:     asymmetric,   Lungs:     Clear to auscultation bilaterally, respirations unlabored after tube placed Right tube in place  Chest Wall:    No tenderness or deformity   Heart:    Regular rate and rhythm, S1 and S2 normal, no murmur, rub   or gallop  Breast Exam:    not examined  Abdomen:     Soft, non-tender, bowel sounds active all four quadrants,    no masses, no organomegaly  Genitalia:  defer  Rectal:  defer  Extremities:    no cyanosis or edema  Pulses:   2+ and symmetric all extremities  Skin:   Skin color, texture, turgor normal, no rashes or lesions  Lymph nodes:   Cervical, supraclavicular, and axillary nodes normal  Neurologic:    2-3/5 LE  Strength with L weaker than R, decreased sensation Bilat LE's grossly. Gait not tested         Assessment/Plan 46 year old female with multiple chronic medical and neuromuscular comorbidities with  second-time  occurrence right-sided spontaneous pneumothorax. A chest tube has been placed and she will be in admitted to the hospital for further monitoring and treatment. Consider Rt VATS  Kellin Bartling B 06/10/2011, 7:54 PM

## 2011-06-10 NOTE — Telephone Encounter (Signed)
Notified pt cxr has been ordered... 06/10/11@ 2:23pm/LMB

## 2011-06-10 NOTE — ED Notes (Signed)
Sent from PMD's office across street w/mother driving.  Has confirmed pneumo by CXR done earlier today.  Has hx of same w/chest tube in dec '12.  Pt is shob w/pain to RT chest.

## 2011-06-10 NOTE — ED Notes (Signed)
[facility] paged at this time.

## 2011-06-10 NOTE — Telephone Encounter (Signed)
agree

## 2011-06-10 NOTE — ED Notes (Signed)
[facility] eta 10 min.

## 2011-06-10 NOTE — ED Provider Notes (Addendum)
History     CSN: [id number]  Arrival date & time 06/10/11  1708   First MD Initiated Contact with Patient 06/10/11 1741      Chief Complaint  Patient presents with  . Shortness of Breath    collapsed RT lung confirmed by PMD and sent here.     (Consider location/radiation/quality/duration/timing/severity/associated sxs/prior treatment) The history is provided by the patient, a relative and medical records. The history is limited by the condition of the patient.   the patient is a 46 year old, female, with a history of spina bifida scoliosis, chronic pain, and pneumothorax, who presents to emergency department complaining of right-sided chest pain, and shortness of breath since this morning.  She was sitting when she had onset of her symptoms.  She has not had a cough, fever, trauma.  She has not been sick recently. She was seen at her primary care doctor's office where she had a chest x-ray.  The pneumothorax was recognized, so they sent her to the emergency department for treatment.  At the end of November of last year.  She had a spontaneous pneumothorax and was treated in the hospital for several days.  She has been doing well until today.   A level V caveat applies for urgent need for intervention to  Past Medical History  Diagnosis Date  . Chronic pain syndrome     s/p rehab - narc detox fall 2011  . SPINA BIFIDA   . Scoliosis     multiple surg for same  . DEPRESSION     > ptsd (spouse suicide/gsw 11/2005  . GERD   . HYPERTENSION   . Anxiety   . Arthritis   . Pneumothorax on right 04/03/11    large  . Seizures     onset fall 2011 with narc detox  . Chronic back pain greater than 3 months duration   . Heart murmur     "in my mid teens"  . Bronchitis     h/o  . Pneumonia     h/o  . Chronic kidney disease     "left kidney is ~ 25 cent sized; self caths"  . Headache     Past Surgical History  Procedure Date  . Lumbar laminectomy for tethered cord release     x's 4  (84,94,98, and 02)  . 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 Scoliosis 1982    neck fused w/bones  . Chest tube placement 04/03/11    S/P large right sided pneumo  . Back surgery     Family History  Problem Relation Age of Onset  . Diabetes Father   . Arthritis Other     parent & grandparent  . Diabetes Other     grandparent    History  Substance Use Topics  . Smoking status: Current Some Day Smoker -- 3.0 packs/day for 27 years    Types: Cigarettes  . Smokeless tobacco: Never Used   Comment: Widowed, lives alone  . 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 Negative for cough.   Cardiovascular: Positive for chest pain and palpitations.  Neurological: Negative for dizziness.  All other systems reviewed  and are negative.    Allergies  Latex; Codeine; and Erythromycin  Home Medications   Current Outpatient Rx  Name Route Sig Dispense Refill  . AMLODIPINE BESYLATE 5 MG PO TABS  take 1 tablet by mouth once daily 30 tablet 5  . CLONAZEPAM 0.5 MG PO TABS Oral Take 1 tablet (0.5 mg total) by mouth 2 (two) times daily as needed for anxiety. 60 tablet 1  . FLUOXETINE HCL 20 MG PO TABS Oral Take 3 tablets (60 mg total) by mouth daily. Take 3 by mouth once daily 90 tablet 5  . LEVETIRACETAM 250 MG PO TABS Oral Take 250 mg by mouth daily.     Carma Leaven M PLUS PO TABS Oral Take 1 tablet by mouth daily. 30 each   . NORETHINDRONE 0.35 MG PO TABS Oral Take 1 tablet (0.35 mg total) by mouth daily. 28 tablet 11  . OMEPRAZOLE 20 MG PO CPDR  take 1 capsule by mouth once daily 30 capsule 5  . OXYBUTYNIN CHLORIDE ER 10 MG PO TB24  take 1 tablet by mouth once daily 30 tablet 5    BP 162/83  Pulse 124  Temp(Src) 98.8 F (37.1 C) (Oral)  Resp 40  SpO2 96%  Physical Exam  Vitals  reviewed. Constitutional: She is oriented to person, place, and time. She appears well-developed and well-nourished. She appears distressed.  HENT:  Head: Normocephalic and atraumatic.  Eyes: Conjunctivae are normal. Pupils are equal, round, and reactive to light.  Neck: Normal range of motion. Neck supple. No tracheal deviation present.  Cardiovascular:       Tachycardia  Pulmonary/Chest: No stridor. She is in respiratory distress.       Tachypnea decreased breath sounds on the right-hand side Splinting respiration  Abdominal: She exhibits no distension.  Neurological: She is alert and oriented to person, place, and time.  Skin: Skin is warm and dry.  Psychiatric: Judgment and thought content normal.       Anxious    ED Course  CHEST TUBE INSERTION Date/Time: 06/10/2011 7:19 PM Performed by: Weldon Inches, Kayshawn Ozburn P Authorized by: Weldon Inches, Benjimen Kelley P Consent: Verbal consent obtained. Written consent not obtained. The procedure was performed in an emergent situation. Consent given by: patient Patient understanding: patient states understanding of the procedure being performed Imaging studies: imaging studies available Indications: pneumothorax Patient sedated: yes Sedation type: anxiolysis Sedatives: lorazepam Analgesia: fentanyl Anesthesia: local infiltration Local anesthetic: lidocaine 1% with epinephrine Anesthetic total: 5 ml Preparation: skin prepped with Betadine and sterile field established Placement location: right lateral Scalpel size: 11 Tube size: 20 [place] Dissection instrument: Kelly clamp Tension pneumothorax heard: no Tube connected to: water seal Suture material: 2-0 silk Dressing: 4x4 sterile gauze and petrolatum-impregnated gauze Post-insertion x-ray findings: tube in good position Patient tolerance: Patient tolerated the procedure well with no immediate complications.   (including critical care time) 46 year old, female, with recurrent pneumothorax.   Approximately 90% with respiratory distress.  Anxiety, and tachypnea, and tachycardia.  Urgent need for chest tube.  CRITICAL CARE Performed by: Nicholes Stairs   Total critical care time: 30 min  Critical care time was exclusive of separately billable procedures and treating other patients.  Critical care was necessary to treat or prevent imminent or life-threatening deterioration.  Critical care was time spent personally by me on the following activities: development of treatment plan with patient and/or surrogate as well as nursing, discussions with consultants, evaluation of patient's response to treatment, examination of patient, obtaining history from patient or  surrogate, ordering and performing treatments and interventions, ordering and review of laboratory studies, ordering and review of radiographic studies, pulse oximetry and re-evaluation of patient's condition.  Labs Reviewed - No data to display Dg Chest 2 View  06/10/2011  *RADIOLOGY REPORT*  Clinical Data: History of pneumothorax in November.  Shortness of breath.  Ex-smoker.  History of hypertension.  Right-sided chest pain.  CHEST - 2 VIEW  Comparison: Chest x-ray 04/15/2011.  Findings: Large right-sided hydropneumothorax (greater than 90% of lung volume loss, small effusion component).  Interstitial prominence throughout the left lung.  Heart size is normal. Mediastinal contours are unremarkable (specifically, no shift of mediastinal contents to suggest a tension pneumothorax at this time).  IMPRESSION:  1.  Large right-sided hydropneumothorax, as above.  Critical Value/emergent results were called by telephone at the time of interpretation on 06/10/2011  at 04:55 p.m.  to  Dr. Everardo All, who verbally acknowledged these results.  Original Report Authenticated By: Florencia Reasons, M.D.   Dg Chest Portable 1 View  06/10/2011  *RADIOLOGY REPORT*  Clinical Data: Shortness of breath, pneumothorax, post chest tube placement   PORTABLE CHEST - 1 VIEW  Comparison: Earlier film of the same day  Findings: A right chest tube is in place, directed towards the hilum.  There has been interval evacuation of the previously noted pneumothorax.  There is some residual patchy atelectasis or consolidation  at the right lung base.  Coarse perihilar interstitial markings as before.  Heart size within normal limits. 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  Cannot exclude small right pleural effusion.  IMPRESSION:  1.  Right chest tube placement with evacuation of pneumothorax. 2.  Patchy atelectasis or infiltrate at the right lung base.  The findings were reviewed with Dr. Weldon Inches on the phone at the time of interpretation.  Original Report Authenticated By: Osa Craver, M.D.   Dg Chest Port 1 View  06/10/2011  *RADIOLOGY REPORT*  Clinical Data: Increased shortness of breath.  PORTABLE CHEST - 1 VIEW  Comparison: Portable film 1 hour earlier.  Findings: Large right pneumothorax persists, greater than 90% lung volume loss.  Small effusion component not well visualized on this film.  Little change from film 1 hour earlier.   No mediastinal shift or tension component. Mild basilar atelectasis left lung.  IMPRESSION: Large right pneumothorax.  Findings discussed with Dr. Weldon Inches.  Original Report Authenticated By: Elsie Stain, M.D.     No diagnosis found.  6:47 PM Spoke with Dr. Tyrone Sage.  He accepted the pt for transfer [facility] for [facility].   ED ECG REPORT   Date: 06/10/2011  EKG Time: 7:56 PM  Rate: 133  Rhythm: sinus tachycardia,    Axis: nl  Intervals:none  ST&T Change: nonspecific tw changes  Narrative Interpretation: sinus tachycardia            MDM  Right pneumothorax with chest pain.  Respiratory distress.  Anxiety, and tachycardia.  Resolved after chest tube placement. Chest pain, improved.  Oxygen saturation normal.  Following chest tube  insertion         Nicholes Stairs, MD 06/10/11 1928  Nicholes Stairs, MD 06/10/11 1940  Nicholes Stairs, MD 06/10/11 [phone]

## 2011-06-10 NOTE — Telephone Encounter (Signed)
Patient called stating she is having the same symptoms as when her lung collapsed before. Patient was given option to come in for ?chest xray or call Dr. Laneta Simmers office or [facility]. Patient called back stating she would like to have a chest xray ordered by [facility]. Please advise.

## 2011-06-10 NOTE — Telephone Encounter (Signed)
Radiologist called and spoke with Dr. Everardo All concerning pt cxr. Per md pt needs to go to Er NOW fluid & air in lungs. Called pt she was still waiting downstairs gave md recommendations. Pt aggred and states she was going to [facility].... 06/10/11@4 :59pm/LMB

## 2011-06-10 NOTE — Telephone Encounter (Signed)
cxr ordered - i will not be here this afternoon so please have radiology call report to Valentina Gu (or someone who will be here) - thanks

## 2011-06-11 ENCOUNTER — Inpatient Hospital Stay (HOSPITAL_COMMUNITY): Payer: Medicare Other

## 2011-06-11 ENCOUNTER — Inpatient Hospital Stay (HOSPITAL_COMMUNITY): Payer: Medicare Other | Admitting: Certified Registered"

## 2011-06-11 ENCOUNTER — Encounter (HOSPITAL_COMMUNITY): Payer: Self-pay | Admitting: Certified Registered"

## 2011-06-11 ENCOUNTER — Encounter (HOSPITAL_COMMUNITY): Payer: Self-pay | Admitting: Radiology

## 2011-06-11 ENCOUNTER — Encounter (HOSPITAL_COMMUNITY)
Admission: EM | Disposition: A | Discharge status: Home / Self Care | Payer: Self-pay | Source: Ambulatory Visit | Attending: Cardiothoracic Surgery

## 2011-06-11 DIAGNOSIS — J930 Spontaneous tension pneumothorax: Secondary | ICD-10-CM

## 2011-06-11 HISTORY — PX: RESECTION OF APICAL BLEB: SHX5078

## 2011-06-11 HISTORY — PX: VIDEO ASSISTED THORACOSCOPY: SHX5073

## 2011-06-11 LAB — COMPREHENSIVE METABOLIC PANEL
ALT: 9 U/L (ref 0–35)
AST: 13 U/L (ref 0–37)
Albumin: 3.4 g/dL — ABNORMAL LOW (ref 3.5–5.2)
Alkaline Phosphatase: 96 U/L (ref 39–117)
BUN: 9 mg/dL (ref 6–23)
CO2: 21 mEq/L (ref 19–32)
Calcium: 9.1 mg/dL (ref 8.4–10.5)
Chloride: 108 mEq/L (ref 96–112)
Creatinine, Ser: 0.7 mg/dL (ref 0.50–1.10)
GFR calc Af Amer: >90 mL/min (ref >90)
GFR calc non Af Amer: >90 mL/min (ref >90)
Glucose, Bld: 94 mg/dL (ref 70–99)
Potassium: 3.4 mEq/L — ABNORMAL LOW (ref 3.5–5.1)
Sodium: 140 mEq/L (ref 135–145)
Total Bilirubin: 0.5 mg/dL (ref 0.3–1.2)
Total Protein: 6.5 g/dL (ref 6.0–8.3)

## 2011-06-11 LAB — CBC
HCT: 38.8 % (ref 36.0–46.0)
Hemoglobin: 13.2 g/dL (ref 12.0–15.0)
MCH: 31.4 pg (ref 26.0–34.0)
MCHC: 34 g/dL (ref 30.0–36.0)
MCV: 92.4 fL (ref 78.0–100.0)
Platelets: 282 10*3/uL (ref 150–400)
RBC: 4.2 MIL/uL (ref 3.87–5.11)
RDW: 13.6 % (ref 11.5–15.5)
WBC: 9.5 10*3/uL (ref 4.0–10.5)

## 2011-06-11 LAB — POCT I-STAT 3, ART BLOOD GAS (G3+)
Acid-base deficit: 3 mmol/L — ABNORMAL HIGH (ref 0.0–2.0)
Acid-base deficit: 3 mmol/L — ABNORMAL HIGH (ref 0.0–2.0)
Bicarbonate: 21.2 mEq/L (ref 20.0–24.0)
Bicarbonate: 22.5 mEq/L (ref 20.0–24.0)
O2 Saturation: 79 %
O2 Saturation: 88 %
Patient temperature: 98.8
Patient temperature: 98.8
TCO2: 22 mmol/L (ref 0–100)
TCO2: 24 mmol/L (ref 0–100)
pCO2 arterial: 34.3 mmHg — ABNORMAL LOW (ref 35.0–45.0)
pCO2 arterial: 39.4 mmHg (ref 35.0–45.0)
pH, Arterial: 7.366 (ref 7.350–7.400)
pH, Arterial: 7.399 (ref 7.350–7.400)
pO2, Arterial: 45 mmHg — ABNORMAL LOW (ref 80.0–100.0)
pO2, Arterial: 54 mmHg — ABNORMAL LOW (ref 80.0–100.0)

## 2011-06-11 LAB — PROTIME-INR
INR: 1.08 (ref 0.00–1.49)
Prothrombin Time: 14.2 seconds (ref 11.6–15.2)

## 2011-06-11 LAB — BLOOD GAS, ARTERIAL
Acid-base deficit: 2.2 mmol/L — ABNORMAL HIGH (ref 0.0–2.0)
Bicarbonate: 22.7 mEq/L (ref 20.0–24.0)
Drawn by: 129711
FIO2: 100 %
O2 Saturation: 95.9 %
PEEP: 5 cmH2O
PEEP: 5 cmH2O
Pressure support: 10 cmH2O
pCO2 arterial: 49.1 mmHg — ABNORMAL HIGH (ref 35.0–45.0)
pCO2 arterial: 42.9 mmHg (ref 35.0–45.0)
pO2, Arterial: 65.3 mmHg — ABNORMAL LOW (ref 80.0–100.0)
pO2, Arterial: 83.3 mmHg (ref 80.0–100.0)

## 2011-06-11 LAB — PREPARE RBC (CROSSMATCH)

## 2011-06-11 LAB — ABO/RH: ABO/RH(D): O POS

## 2011-06-11 SURGERY — VIDEO ASSISTED THORACOSCOPY
Anesthesia: General | Site: Chest | Laterality: Right | Wound class: Clean Contaminated

## 2011-06-11 MED ORDER — NORETHINDRONE 0.35 MG PO TABS
1.0000 | ORAL_TABLET | Freq: Every day | ORAL | Status: DC
  Administered 2011-06-12 – 2011-06-16 (×5): 0.35 mg via ORAL
  Filled 2011-06-11 (×3): qty 1

## 2011-06-11 MED ORDER — MEPERIDINE HCL 25 MG/ML IJ SOLN: 6.2500 mg | INTRAMUSCULAR | Status: DC | PRN

## 2011-06-11 MED ORDER — GLYCOPYRROLATE 0.2 MG/ML IJ SOLN
INTRAMUSCULAR | Status: DC | PRN
  Administered 2011-06-11: .6 mg via INTRAVENOUS

## 2011-06-11 MED ORDER — FENTANYL CITRATE 0.05 MG/ML IJ SOLN: 50.0000 ug | INTRAMUSCULAR | Status: DC | PRN

## 2011-06-11 MED ORDER — ACETAMINOPHEN 10 MG/ML IV SOLN
1000.0000 mg | Freq: Four times a day (QID) | INTRAVENOUS | Status: AC
  Administered 2011-06-11 – 2011-06-12 (×3): 1000 mg via INTRAVENOUS
  Filled 2011-06-11 (×5): qty 100

## 2011-06-11 MED ORDER — MIDAZOLAM HCL 5 MG/5ML IJ SOLN
INTRAMUSCULAR | Status: DC | PRN
  Administered 2011-06-11: 2 mg via INTRAVENOUS

## 2011-06-11 MED ORDER — NEOSTIGMINE METHYLSULFATE 1 MG/ML IJ SOLN
INTRAMUSCULAR | Status: DC | PRN
  Administered 2011-06-11: 4 mg via INTRAVENOUS

## 2011-06-11 MED ORDER — FENTANYL 10 MCG/ML IV SOLN
INTRAVENOUS | Status: DC
  Administered 2011-06-11: 21:00:00 via INTRAVENOUS
  Administered 2011-06-12: 165 ug via INTRAVENOUS
  Administered 2011-06-12: 32 ug via INTRAVENOUS
  Administered 2011-06-12: 183.3 ug via INTRAVENOUS
  Administered 2011-06-12: 120 ug via INTRAVENOUS
  Administered 2011-06-13: 251 ug via INTRAVENOUS
  Administered 2011-06-13: 75 ug via INTRAVENOUS
  Administered 2011-06-13: 45 ug via INTRAVENOUS
  Administered 2011-06-13: 32 ug via INTRAVENOUS
  Administered 2011-06-13: 195 ug via INTRAVENOUS
  Administered 2011-06-13: 160 ug via INTRAVENOUS
  Administered 2011-06-13: 12:00:00 via INTRAVENOUS
  Administered 2011-06-14: 80 ug via INTRAVENOUS
  Administered 2011-06-14: 30 ug via INTRAVENOUS
  Administered 2011-06-14: 270 ug via INTRAVENOUS
  Administered 2011-06-14 (×2): 60 ug via INTRAVENOUS
  Administered 2011-06-14: 57.6 ug via INTRAVENOUS
  Filled 2011-06-11 (×6): qty 50

## 2011-06-11 MED ORDER — FENTANYL CITRATE 0.05 MG/ML IJ SOLN
INTRAMUSCULAR | Status: AC
  Filled 2011-06-11: qty 2

## 2011-06-11 MED ORDER — LACTATED RINGERS IV SOLN
INTRAVENOUS | Status: DC | PRN
  Administered 2011-06-11 (×4): via INTRAVENOUS

## 2011-06-11 MED ORDER — PROPOFOL 10 MG/ML IV EMUL
INTRAVENOUS | Status: DC | PRN
  Administered 2011-06-11: 50 ug/kg/min via INTRAVENOUS

## 2011-06-11 MED ORDER — OXYBUTYNIN CHLORIDE ER 10 MG PO TB24
10.0000 mg | ORAL_TABLET | Freq: Every day | ORAL | Status: DC
  Administered 2011-06-11 – 2011-06-17 (×7): 10 mg via ORAL
  Filled 2011-06-11 (×9): qty 1

## 2011-06-11 MED ORDER — FLUOXETINE HCL 20 MG PO CAPS
60.0000 mg | ORAL_CAPSULE | Freq: Every day | ORAL | Status: DC
  Administered 2011-06-12 – 2011-06-18 (×7): 60 mg via ORAL
  Filled 2011-06-11 (×7): qty 3

## 2011-06-11 MED ORDER — BUPIVACAINE 0.5 % ON-Q PUMP SINGLE CATH 400 ML
400.0000 mL | INJECTION | Status: DC
  Filled 2011-06-11: qty 400

## 2011-06-11 MED ORDER — NORETHINDRONE 0.35 MG PO TABS
1.0000 | ORAL_TABLET | Freq: Once | ORAL | Status: AC
  Administered 2011-06-11: 0.35 mg via ORAL
  Filled 2011-06-11: qty 1

## 2011-06-11 MED ORDER — AMLODIPINE BESYLATE 5 MG PO TABS
5.0000 mg | ORAL_TABLET | Freq: Every day | ORAL | Status: DC
  Administered 2011-06-12 – 2011-06-18 (×6): 5 mg via ORAL
  Filled 2011-06-11 (×7): qty 1

## 2011-06-11 MED ORDER — WHITE PETROLATUM GEL
Status: DC | PRN
  Filled 2011-06-11: qty 5

## 2011-06-11 MED ORDER — POTASSIUM CHLORIDE IN NACL 20-0.9 MEQ/L-% IV SOLN
INTRAVENOUS | Status: DC
  Administered 2011-06-11 – 2011-06-12 (×2): 125 mL/h via INTRAVENOUS
  Administered 2011-06-13: 09:00:00 via INTRAVENOUS
  Filled 2011-06-11 (×7): qty 1000

## 2011-06-11 MED ORDER — FENTANYL CITRATE 0.05 MG/ML IJ SOLN
50.0000 ug | INTRAMUSCULAR | Status: DC | PRN
  Administered 2011-06-11 (×4): 50 ug via INTRAVENOUS

## 2011-06-11 MED ORDER — DEXTROSE 5 % IV SOLN
INTRAVENOUS | Status: AC
  Filled 2011-06-11: qty 50

## 2011-06-11 MED ORDER — BUPIVACAINE HCL (PF) 0.5 % IJ SOLN
INTRAMUSCULAR | Status: DC | PRN
  Administered 2011-06-11: 30 mL

## 2011-06-11 MED ORDER — PROMETHAZINE HCL 25 MG/ML IJ SOLN: 6.2500 mg | INTRAMUSCULAR | Status: DC | PRN

## 2011-06-11 MED ORDER — THERA M PLUS PO TABS
1.0000 | ORAL_TABLET | Freq: Every day | ORAL | Status: DC
  Administered 2011-06-12 – 2011-06-17 (×6): 1 via ORAL
  Filled 2011-06-11 (×7): qty 1

## 2011-06-11 MED ORDER — OXYCODONE HCL 5 MG PO TABS
5.0000 mg | ORAL_TABLET | ORAL | Status: AC | PRN
  Administered 2011-06-12: 10 mg via ORAL
  Administered 2011-06-12: 5 mg via ORAL
  Filled 2011-06-11: qty 2
  Filled 2011-06-11: qty 1

## 2011-06-11 MED ORDER — CEFUROXIME SODIUM 1.5 G IJ SOLR
INTRAMUSCULAR | Status: AC
  Filled 2011-06-11: qty 1.5

## 2011-06-11 MED ORDER — OXYCODONE HCL 5 MG PO TABS
10.0000 mg | ORAL_TABLET | ORAL | Status: DC | PRN
  Administered 2011-06-11: 10 mg via ORAL
  Filled 2011-06-11: qty 2

## 2011-06-11 MED ORDER — OXYCODONE-ACETAMINOPHEN 5-325 MG PO TABS
1.0000 | ORAL_TABLET | ORAL | Status: DC | PRN
  Administered 2011-06-12: 2 via ORAL
  Administered 2011-06-13: 1 via ORAL
  Administered 2011-06-14: 2 via ORAL
  Filled 2011-06-11: qty 1
  Filled 2011-06-11 (×2): qty 2

## 2011-06-11 MED ORDER — LABETALOL HCL 5 MG/ML IV SOLN
INTRAVENOUS | Status: DC | PRN
  Administered 2011-06-11: 5 mg via INTRAVENOUS

## 2011-06-11 MED ORDER — ONDANSETRON HCL 4 MG/2ML IJ SOLN
INTRAMUSCULAR | Status: DC | PRN
  Administered 2011-06-11: 4 mg via INTRAVENOUS

## 2011-06-11 MED ORDER — ONDANSETRON HCL 4 MG/2ML IJ SOLN
4.0000 mg | Freq: Four times a day (QID) | INTRAMUSCULAR | Status: DC | PRN
  Administered 2011-06-12 – 2011-06-16 (×2): 4 mg via INTRAVENOUS
  Filled 2011-06-11: qty 2

## 2011-06-11 MED ORDER — DEXTROSE 5 % IV SOLN
1.5000 g | Freq: Two times a day (BID) | INTRAVENOUS | Status: AC
  Administered 2011-06-11 – 2011-06-12 (×2): 1.5 g via INTRAVENOUS
  Filled 2011-06-11 (×2): qty 1.5

## 2011-06-11 MED ORDER — MIDAZOLAM HCL 2 MG/2ML IJ SOLN: 1.0000 mg | INTRAMUSCULAR | Status: DC | PRN

## 2011-06-11 MED ORDER — FENTANYL CITRATE 0.05 MG/ML IJ SOLN
INTRAMUSCULAR | Status: DC | PRN
  Administered 2011-06-11 (×2): 100 ug via INTRAVENOUS
  Administered 2011-06-11 (×2): 50 ug via INTRAVENOUS
  Administered 2011-06-11: 100 ug via INTRAVENOUS

## 2011-06-11 MED ORDER — ROCURONIUM BROMIDE 100 MG/10ML IV SOLN
INTRAVENOUS | Status: DC | PRN
  Administered 2011-06-11: 50 mg via INTRAVENOUS
  Administered 2011-06-11: 5 mg via INTRAVENOUS

## 2011-06-11 MED ORDER — BUPIVACAINE 0.5 % ON-Q PUMP SINGLE CATH 400 ML: INJECTION | Status: DC | PRN

## 2011-06-11 MED ORDER — IOHEXOL 300 MG/ML  SOLN
80.0000 mL | Freq: Once | INTRAMUSCULAR | Status: AC | PRN
  Administered 2011-06-11: 80 mL via INTRAVENOUS

## 2011-06-11 MED ORDER — LORAZEPAM 1 MG PO TABS
1.0000 mg | ORAL_TABLET | Freq: Two times a day (BID) | ORAL | Status: DC
  Administered 2011-06-11: 1 mg via ORAL
  Filled 2011-06-11: qty 1

## 2011-06-11 MED ORDER — TRAMADOL HCL 50 MG PO TABS
50.0000 mg | ORAL_TABLET | Freq: Four times a day (QID) | ORAL | Status: DC | PRN
  Administered 2011-06-14 – 2011-06-15 (×2): 100 mg via ORAL
  Filled 2011-06-11 (×2): qty 2

## 2011-06-11 MED ORDER — SODIUM CHLORIDE 0.9 % IJ SOLN: 9.0000 mL | INTRAMUSCULAR | Status: DC | PRN

## 2011-06-11 MED ORDER — MIDAZOLAM HCL 2 MG/2ML IJ SOLN
INTRAMUSCULAR | Status: AC
  Filled 2011-06-11: qty 2

## 2011-06-11 MED ORDER — BISACODYL 5 MG PO TBEC
10.0000 mg | DELAYED_RELEASE_TABLET | Freq: Every day | ORAL | Status: DC
  Administered 2011-06-16: 10 mg via ORAL
  Filled 2011-06-11 (×2): qty 2

## 2011-06-11 MED ORDER — DIPHENHYDRAMINE HCL 12.5 MG/5ML PO ELIX
12.5000 mg | ORAL_SOLUTION | Freq: Four times a day (QID) | ORAL | Status: DC | PRN
  Administered 2011-06-14: 12.5 mg via ORAL
  Filled 2011-06-11: qty 5

## 2011-06-11 MED ORDER — FLUOXETINE HCL 20 MG PO TABS: 60.0000 mg | ORAL_TABLET | Freq: Every day | ORAL | Status: DC

## 2011-06-11 MED ORDER — 0.9 % SODIUM CHLORIDE (POUR BTL) OPTIME
TOPICAL | Status: DC | PRN
  Administered 2011-06-11: 2000 mL

## 2011-06-11 MED ORDER — BUPIVACAINE-EPINEPHRINE 0.25% -1:200000 IJ SOLN: INTRAMUSCULAR | Status: DC | PRN

## 2011-06-11 MED ORDER — MIDAZOLAM HCL 5 MG/ML IJ SOLN
0.5000 mg | INTRAMUSCULAR | Status: DC | PRN
  Administered 2011-06-11: 0.5 mg via INTRAVENOUS
  Administered 2011-06-11: 2 mg via INTRAVENOUS
  Administered 2011-06-11: 1.5 mg via INTRAVENOUS

## 2011-06-11 MED ORDER — PROPOFOL 10 MG/ML IV EMUL
INTRAVENOUS | Status: DC | PRN
  Administered 2011-06-11 (×2): 30 mg via INTRAVENOUS
  Administered 2011-06-11: 200 mg via INTRAVENOUS

## 2011-06-11 MED ORDER — DEXTROSE 5 % IV SOLN
1.5000 g | INTRAVENOUS | Status: AC
  Administered 2011-06-11: 1.5 g via INTRAVENOUS
  Filled 2011-06-11: qty 1.5

## 2011-06-11 MED ORDER — SENNOSIDES-DOCUSATE SODIUM 8.6-50 MG PO TABS
1.0000 | ORAL_TABLET | Freq: Every evening | ORAL | Status: DC | PRN
  Filled 2011-06-11: qty 1

## 2011-06-11 MED ORDER — ONDANSETRON HCL 4 MG/2ML IJ SOLN
4.0000 mg | Freq: Four times a day (QID) | INTRAMUSCULAR | Status: DC | PRN
  Filled 2011-06-11: qty 2

## 2011-06-11 MED ORDER — LEVETIRACETAM 250 MG PO TABS
250.0000 mg | ORAL_TABLET | Freq: Every day | ORAL | Status: DC
  Administered 2011-06-11 – 2011-06-18 (×8): 250 mg via ORAL
  Filled 2011-06-11 (×9): qty 1

## 2011-06-11 MED ORDER — POTASSIUM CHLORIDE 10 MEQ/50ML IV SOLN
10.0000 meq | Freq: Every day | INTRAVENOUS | Status: DC | PRN
  Administered 2011-06-15 (×2): 10 meq via INTRAVENOUS
  Filled 2011-06-11: qty 150

## 2011-06-11 MED ORDER — DIPHENHYDRAMINE HCL 50 MG/ML IJ SOLN
12.5000 mg | Freq: Four times a day (QID) | INTRAMUSCULAR | Status: DC | PRN
  Administered 2011-06-12 – 2011-06-15 (×3): 12.5 mg via INTRAVENOUS
  Filled 2011-06-11 (×2): qty 1

## 2011-06-11 MED ORDER — NALOXONE HCL 0.4 MG/ML IJ SOLN: 0.4000 mg | INTRAMUSCULAR | Status: DC | PRN

## 2011-06-11 SURGICAL SUPPLY — 77 items
ADH SKN CLS APL DERMABOND .7 (GAUZE/BANDAGES/DRESSINGS) ×1
APL SRG 22X2 LUM MLBL SLNT (VASCULAR PRODUCTS)
APL SRG 7X2 LUM MLBL SLNT (VASCULAR PRODUCTS)
APPLICATOR TIP COSEAL (VASCULAR PRODUCTS) IMPLANT
APPLICATOR TIP EXT COSEAL (VASCULAR PRODUCTS) IMPLANT
BLADE SURG 11 STRL SS (BLADE) ×1 IMPLANT
CANISTER SUCTION 2500CC (MISCELLANEOUS) ×4 IMPLANT
CATH KIT ON Q 5IN SLV (PAIN MANAGEMENT) ×2 IMPLANT
CATH THORACIC 28FR (CATHETERS) IMPLANT
CATH THORACIC 36FR (CATHETERS) IMPLANT
CATH THORACIC 36FR RT ANG (CATHETERS) IMPLANT
CLIP TI MEDIUM 6 (CLIP) ×2 IMPLANT
CLOTH BEACON ORANGE TIMEOUT ST (SAFETY) ×4 IMPLANT
CONT SPEC 4OZ CLIKSEAL STRL BL (MISCELLANEOUS) ×8 IMPLANT
COVER SURGICAL LIGHT HANDLE (MISCELLANEOUS) ×4 IMPLANT
CUTTER ENDO LINEAR 45M (STAPLE) ×1 IMPLANT
DERMABOND ADVANCED (GAUZE/BANDAGES/DRESSINGS) ×1
DERMABOND ADVANCED .7 DNX12 (GAUZE/BANDAGES/DRESSINGS) IMPLANT
DRAPE LAPAROSCOPIC ABDOMINAL (DRAPES) ×4 IMPLANT
DRAPE SLUSH MACHINE 52X66 (DRAPES) IMPLANT
DRAPE SLUSH/WARMER DISC (DRAPES) IMPLANT
DRILL BIT 7/64X5 (BIT) ×2 IMPLANT
ELECT BLADE 4.0 EZ CLEAN MEGAD (MISCELLANEOUS) ×2
ELECT REM PT RETURN 9FT ADLT (ELECTROSURGICAL) ×4
ELECTRODE BLDE 4.0 EZ CLN MEGD (MISCELLANEOUS) IMPLANT
ELECTRODE REM PT RTRN 9FT ADLT (ELECTROSURGICAL) ×2 IMPLANT
GLOVE BIO SURGEON STRL SZ 6.5 (GLOVE) ×8 IMPLANT
GOWN STRL NON-REIN LRG LVL3 (GOWN DISPOSABLE) ×12 IMPLANT
HANDLE STAPLE ENDO GIA SHORT (STAPLE) ×1
HEMOSTAT SURGICEL 2X14 (HEMOSTASIS) IMPLANT
KIT BASIN OR (CUSTOM PROCEDURE TRAY) ×4 IMPLANT
KIT ROOM TURNOVER OR (KITS) ×4 IMPLANT
KIT SUCTION CATH 14FR (SUCTIONS) ×3 IMPLANT
NS IRRIG 1000ML POUR BTL (IV SOLUTION) ×8 IMPLANT
PACK CHEST (CUSTOM PROCEDURE TRAY) ×4 IMPLANT
PAD ARMBOARD 7.5X6 YLW CONV (MISCELLANEOUS) ×8 IMPLANT
RELOAD EGIA 45 MED/THCK PURPLE (STAPLE) ×2 IMPLANT
RELOAD ENDO GIA 30 3.5 (STAPLE) ×1 IMPLANT
RELOAD STAPLE 45 3.5 BLU ETS (ENDOMECHANICALS) IMPLANT
RELOAD STAPLE TA45 3.5 REG BLU (ENDOMECHANICALS) ×2 IMPLANT
SEALANT PROGEL (MISCELLANEOUS) IMPLANT
SEALANT SURG COSEAL 4ML (VASCULAR PRODUCTS) IMPLANT
SEALANT SURG COSEAL 8ML (VASCULAR PRODUCTS) IMPLANT
SOLUTION ANTI FOG 6CC (MISCELLANEOUS) ×4 IMPLANT
SPONGE GAUZE 4X4 12PLY (GAUZE/BANDAGES/DRESSINGS) ×4 IMPLANT
STAPLER ENDO GIA 12 SHRT THIN (STAPLE) IMPLANT
STAPLER ENDO GIA 12MM SHORT (STAPLE) ×1 IMPLANT
STAPLER ENDO NO KNIFE (STAPLE) ×1 IMPLANT
SUT BONE WAX W31G (SUTURE) ×1 IMPLANT
SUT ETHILON 3 0 FSL (SUTURE) ×1 IMPLANT
SUT PROLENE 3 0 SH DA (SUTURE) IMPLANT
SUT PROLENE 4 0 RB 1 (SUTURE)
SUT PROLENE 4-0 RB1 .5 CRCL 36 (SUTURE) IMPLANT
SUT SILK  1 MH (SUTURE) ×4
SUT SILK 1 MH (SUTURE) ×4 IMPLANT
SUT SILK 2 0SH CR/8 30 (SUTURE) ×1 IMPLANT
SUT SILK 3 0SH CR/8 30 (SUTURE) IMPLANT
SUT VIC AB 1 CTX 18 (SUTURE) ×2 IMPLANT
SUT VIC AB 1 CTX 36 (SUTURE)
SUT VIC AB 1 CTX36XBRD ANBCTR (SUTURE) IMPLANT
SUT VIC AB 2-0 CTX 36 (SUTURE) ×4 IMPLANT
SUT VIC AB 2-0 UR6 27 (SUTURE) ×4 IMPLANT
SUT VIC AB 3-0 X1 27 (SUTURE) ×4 IMPLANT
SUT VICRYL 2 TP 1 (SUTURE) IMPLANT
SWAB COLLECTION DEVICE MRSA (MISCELLANEOUS) ×2 IMPLANT
SYSTEM SAHARA CHEST DRAIN ATS (WOUND CARE) ×2 IMPLANT
SYSTEM SAHARA CHEST DRAIN RE-I (WOUND CARE) ×2 IMPLANT
TAPE CLOTH 4X10 WHT NS (GAUZE/BANDAGES/DRESSINGS) ×2 IMPLANT
TAPE UMBILICAL COTTON 1/8X30 (MISCELLANEOUS) ×1 IMPLANT
TIP APPLICATOR SPRAY EXTEND 16 (VASCULAR PRODUCTS) IMPLANT
TOWEL OR 17X24 6PK STRL BLUE (TOWEL DISPOSABLE) ×6 IMPLANT
TOWEL OR 17X26 10 PK STRL BLUE (TOWEL DISPOSABLE) ×8 IMPLANT
TRAP SPECIMEN MUCOUS 40CC (MISCELLANEOUS) IMPLANT
TRAY FOLEY CATH 14FRSI W/METER (CATHETERS) ×4 IMPLANT
TUBE ANAEROBIC SPECIMEN COL (MISCELLANEOUS) ×2 IMPLANT
TUNNELER SHEATH ON-Q 11GX8 (MISCELLANEOUS) ×1 IMPLANT
WATER STERILE IRR 1000ML POUR (IV SOLUTION) ×4 IMPLANT

## 2011-06-11 NOTE — Telephone Encounter (Signed)
Noted and agree - thanks! Pt was seen and admitted to [facility]

## 2011-06-11 NOTE — Anesthesia Procedure Notes (Addendum)
Procedure Name: Intubation Date/Time: 06/11/2011 12:24 PM Performed by: Ellin Goodie Pre-anesthesia Checklist: Patient identified, Emergency Drugs available, Suction available, Patient being monitored and Timeout performed Patient Re-evaluated:Patient Re-evaluated prior to inductionOxygen Delivery Method: Circle System Utilized Preoxygenation: Pre-oxygenation with 100% oxygen Intubation Type: IV induction Ventilation: Mask ventilation without difficulty Laryngoscope Size: Mac and 3 Grade View: Grade I Tube type: Oral Endobronchial tube: Bronchial Blocker placed under direct vision Tube size: 8.5 mm Number of attempts: 2 Airway Equipment and Method: stylet Placement Confirmation: ETT inserted through vocal cords under direct vision,  positive ETCO2 and breath sounds checked- equal and bilateral Secured at: 19 cm Tube secured with: Tape Dental Injury: Teeth and Oropharynx as per pre-operative assessment     1115-1130: The patient was identified and consent obtained.  TO was performed, and full barrier precautions were used.  The skin was anesthetized with lidocaine.  Once the vein was located with the 22 ga. needle using ultrasound guidance , the wire was inserted into the vein.  The wire location was confirmed with ultrasound.  The tissue was dilated and the catheter was carefully inserted, then sutured in place. A dressing was applied. The patient tolerated the procedure well.   CE

## 2011-06-11 NOTE — Progress Notes (Signed)
Respiratory therapy note-

## 2011-06-11 NOTE — Anesthesia Preprocedure Evaluation (Addendum)
Anesthesia Evaluation  Patient identified by MRN, date of birth, ID band Patient awake    Reviewed: Allergy & Precautions, H&P , NPO status , Patient's Chart, lab work & pertinent test results  Airway Mallampati: II      Dental   Pulmonary pneumonia , former smoker Quit smoking Oct 2010   + decreased breath sounds      Cardiovascular hypertension, Pt. on medications + Valvular Problems/Murmurs MVP Regular Normal    Neuro/Psych  Headaches, Seizures -, Well Controlled,  PSYCHIATRIC DISORDERS Anxiety Depression Last seizure 04-01-11 Spina Bifida    GI/Hepatic Neg liver ROS, GERD-  ,  Endo/Other  Negative Endocrine ROS  Renal/GU      Musculoskeletal   Abdominal   Peds  Hematology negative hematology ROS (+)   Anesthesia Other Findings   Reproductive/Obstetrics                          Anesthesia Physical Anesthesia Plan  ASA: III  Anesthesia Plan: General   Post-op Pain Management:    Induction: Intravenous  Airway Management Planned: Double Lumen EBT  Additional Equipment:   Intra-op Plan:   Post-operative Plan: Possible Post-op intubation/ventilation  Informed Consent: I have reviewed the patients History and Physical, chart, labs and discussed the procedure including the risks, benefits and alternatives for the proposed anesthesia with the patient or authorized representative who has indicated his/her understanding and acceptance.     Plan Discussed with: CRNA  Anesthesia Plan Comments:         Anesthesia Quick Evaluation

## 2011-06-11 NOTE — Progress Notes (Signed)
Patient ID: Rhonda Ramos, female   DOB: 1966-04-17, 46 y.o.   MRN: [id number]                   [address]            Rhonda Ramos [address]          [phone]     Day of Surgery Procedure(s) (LRB): VIDEO BRONCHOSCOPY () VIDEO ASSISTED THORACOSCOPY (Right)  LOS: 1 day   Subjective: Admitted last pm with chest tube in place from ER  Objective: Vital signs in last 24 hours: Patient Vitals for the past 24 hrs:  BP Temp Temp src Pulse Resp SpO2 Height Weight  06/11/11 0621 127/82 mmHg 98 F (36.7 C) Oral 88  20  100 % - -  06/10/11 2234 124/77 mmHg 98.5 F (36.9 C) Oral 112  20  92 % 4\' 7"  (1.397 m) 115 lb 8.3 oz (52.4 kg)  06/10/11 2029 130/82 mmHg 98.7 F (37.1 C) Oral 115  22  98 % - -  06/10/11 1938 132/89 mmHg - - 125  24  93 % - -  06/10/11 1837 162/83 mmHg - - 124  - 96 % - -  06/10/11 1717 168/98 mmHg 98.8 F (37.1 C) Oral 136  40  94 % - -    Filed Weights   06/10/11 2234  Weight: 115 lb 8.3 oz (52.4 kg)    Hemodynamic parameters for last 24 hours:    Intake/Output from previous day: 02/05 0701 - 02/06 0700 In: 1666.7 [I.V.:1666.7] Out: 860 [Urine:850; Chest Tube:10] Intake/Output this shift: Total I/O In: 0  Out: 550 [Urine:550]  Scheduled Meds:   . cefUROXime (ZINACEF)  IV  1.5 g Intravenous On Call to OR  . clonazePAM  0.5 mg Oral BID  . diphenhydrAMINE  25 mg Intravenous Once  . fentaNYL  25 mcg Intravenous Once  . fentaNYL  50 mcg Intravenous Once  . fentaNYL  50 mcg Intravenous Once  .  HYDROmorphone (DILAUDID) injection  2 mg Intravenous Once  . LORazepam  2 mg Intravenous Once  . LORazepam  2 mg Intravenous Once  . LORazepam  1 mg Oral BID  . DISCONTD: diphenhydrAMINE  25 mg Intravenous Once   Continuous Infusions:   . sodium chloride 125 mL/hr at 06/10/11 2055   PRN Meds:.iohexol, oxyCODONE, DISCONTD: nitroGLYCERIN, DISCONTD: oxyCODONE, DISCONTD: traMADol  General appearance: alert, cooperative, appears older than stated  age and very anxious Neurologic: intact Heart: regular rate and rhythm, S1, S2 normal, no murmur, click, rub or gallop Lungs: clear to auscultation bilaterally Abdomen: soft, non-tender; bowel sounds normal; no masses,  no organomegaly  Lab Results: CBC: Basename 06/11/11 0554  WBC 9.5  HGB 13.2  HCT 38.8  PLT 282   BMET:  Basename 06/11/11 0554  NA 140  K 3.4*  CL 108  CO2 21  GLUCOSE 94  BUN 9  CREATININE 0.70  CALCIUM 9.1    PT/INR:  Basename 06/11/11 0554  LABPROT 14.2  INR 1.08     Radiology Dg Chest 2 View  06/10/2011  *RADIOLOGY REPORT*  Clinical Data: History of pneumothorax in November.  Shortness of breath.  Ex-smoker.  History of hypertension.  Right-sided chest pain.  CHEST - 2 VIEW  Comparison: Chest x-ray 04/15/2011.  Findings: Large right-sided hydropneumothorax (greater than 90% of lung volume loss, small effusion component).  Interstitial prominence throughout the left lung.  Heart size is normal. Mediastinal contours are unremarkable (  specifically, no shift of mediastinal contents to suggest a tension pneumothorax at this time).  IMPRESSION:  1.  Large right-sided hydropneumothorax, as above.  Critical Value/emergent results were called by telephone at the time of interpretation on 06/10/2011  at 04:55 p.m.  to  Dr. Everardo All, who verbally acknowledged these results.  Original Report Authenticated By: Florencia Reasons, M.D.   Ct Chest W Contrast  06/11/2011  *RADIOLOGY REPORT*  Clinical Data: Evaluate for pulmonary bleb  CT CHEST WITH CONTRAST  Technique:  Multidetector CT imaging of the chest was performed following the standard protocol during bolus administration of intravenous contrast.  Contrast: 80mL OMNIPAQUE IOHEXOL 300 MG/ML IV SOLN  Comparison: 04/04/2011  Findings:  There is a right chest tube in place.  A small amount of subcutaneous emphysema is identified at the chest tube insertion site.  No enlarged axillary or supraclavicular lymph nodes.  There  is no mediastinal or hilar adenopathy.  No pericardial effusion or pleural effusion noted.  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  There is a loculated air collection in the posterior medial right hemithorax that is favored to represent the loculated, residual pneumothorax.  This currently measures 2.2 x 6.8 cm.  Previously 1.8 x 5.7 cm.  Alternatively, as mentioned previously this could represent a large bleb.  Slightly more cranially there is a pulmonary bleb measuring 4.4 x 2.9 cm, image 16.  Unchanged from prior examination.  Left upper lobe bleb measures 6.7 x 4.9 cm.  Similar appearance of subpleural consolidation within the medial left lung base and within the right lower lobe overlying the air collection.  Several tiny foci of pneumomediastinum are identified, image 31 and image 29.  IMPRESSION:  1.  Loculated pneumothorax versus pulmonary bleb within the posterior medial right hemothorax is similar in size from previous examination. 2.  Bilateral blebs and bibasilar atelectasis, similar to previous exam. 3.  Right chest tube in place. 4.  Small amount of pneumomediastinum.  Original Report Authenticated By: Rosealee Albee, M.D.   Dg Chest Portable 1 View  06/10/2011  *RADIOLOGY REPORT*  Clinical Data: Shortness of breath, pneumothorax, post chest tube placement  PORTABLE CHEST - 1 VIEW  Comparison: Earlier film of the same day  Findings: A right chest tube is in place, directed towards the hilum.  There has been interval evacuation of the previously noted pneumothorax.  There is some residual patchy atelectasis or consolidation  at the right lung base.  Coarse perihilar interstitial markings as before.  Heart size within normal limits. 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  Cannot exclude small right pleural effusion.  IMPRESSION:  1.  Right chest tube placement with evacuation of pneumothorax. 2.  Patchy atelectasis or infiltrate at the right lung base.  The  findings were reviewed with Dr. Weldon Inches on the phone at the time of interpretation.  Original Report Authenticated By: Osa Craver, M.D.   Dg Chest Port 1 View  06/10/2011  *RADIOLOGY REPORT*  Clinical Data: Increased shortness of breath.  PORTABLE CHEST - 1 VIEW  Comparison: Portable film 1 hour earlier.  Findings: Large right pneumothorax persists, greater than 90% lung volume loss.  Small effusion component not well visualized on this film.  Little change from film 1 hour earlier.   No mediastinal shift or tension component. Mild basilar atelectasis left lung.  IMPRESSION: Large right pneumothorax.  Findings discussed with Dr. Weldon Inches.  Original Report Authenticated By: Elsie Stain, M.D.  Assessment/Plan: S/P Procedure(s) (LRB): VIDEO BRONCHOSCOPY () VIDEO ASSISTED THORACOSCOPY (Right) Ct scan reviewed, with second episode have recommended to patient to proceed with VATS and stapling of Blebs to prevent recurrence of PTX  The goals risks and alternatives of the planned surgical procedure RT VATS and Stapling Blebs have been discussed with the patient in detail. The risks of the procedure including death, infection, stroke, myocardial infarction, bleeding, blood transfusion have all been discussed specifically.  I have quoted Rhonda Ramos a 4 % of perioperative mortality and a complication rate as high as 20 %. The patient's questions have been answered.PRESLYN WARR is willing  to proceed with the planned procedure.   Delight Ovens MD 06/11/2011 11:23 AM

## 2011-06-11 NOTE — Brief Op Note (Signed)
06/10/2011 - 06/11/2011  3:11 PM  PATIENT:  Rhonda Ramos  46 y.o. female  PRE-OPERATIVE DIAGNOSIS:  Recurrent Spontaneous right pneumothorax  POST-OPERATIVE DIAGNOSIS:  Recurrent Spontaneous right pneumothorax  PROCEDURE:  RIGHT VIDEO ASSISTED THORACOSCOPY,Stapeling of apical and inferior blebs, and mechanical pleurodesis  SURGEON:  Delight Ovens, MD  PHYSICIAN ASSISTANT: Doree Fudge PA-C   ANESTHESIA:   general  EBL:  Total I/O In: 2800 [I.V.:2800] Out: 1200 [Urine:1200]  BLOOD ADMINISTERED:none  DRAINS: 28 French Chest Tube(s) in the right pleural space   LOCAL MEDICATIONS USED: None  SPECIMEN:  Source of Specimen:  Apical and inferior blebs  DISPOSITION OF SPECIMEN:  PATHOLOGY  COUNTS CORRECT:  YES  DICTATION: .Dragon Dictation  PLAN OF CARE: Admit to inpatient   PATIENT DISPOSITION:  PACU - hemodynamically stable.   Delay start of Pharmacological VTE agent (>24hrs) due to surgical blood loss or risk of bleeding:  YES

## 2011-06-11 NOTE — Progress Notes (Signed)
Extubated on O2 mask  .97 sat No air leak Stable hemodynamics

## 2011-06-11 NOTE — Transfer of Care (Signed)
Immediate Anesthesia Transfer of Care Note  Patient: Rhonda Ramos  Procedure(s) Performed:  VIDEO ASSISTED THORACOSCOPY - Stapling of Large Apical and Inferiors blebs; Mechanical Pleurodesis   Patient Location: PACU  Anesthesia Type: General  Level of Consciousness: sedated  Airway & Oxygen Therapy: Patient remains intubated per anesthesia plan  Post-op Assessment: Report given to PACU RN  Post vital signs: stable  Complications: No apparent anesthesia complications

## 2011-06-11 NOTE — Progress Notes (Signed)
Patient ID: Rhonda Ramos, female   DOB: 08-Mar-1966, 46 y.o.   MRN: [id number] Patient very anxious, wants more pain medicine. Will get ct of chest to evaluate for blebs

## 2011-06-11 NOTE — Anesthesia Postprocedure Evaluation (Signed)
  Anesthesia Post-op Note  Patient: Rhonda Ramos  Procedure(s) Performed:  VIDEO ASSISTED THORACOSCOPY - Stapling of Large Apical and Inferiors blebs; Mechanical Pleurodesis   Patient Location: PACU  Anesthesia Type: General  Level of Consciousness: Patient remains intubated per anesthesia plan  Airway and Oxygen Therapy: Patient remains intubated per anesthesia plan  Post-op Pain: Controlled  Post-op Assessment: Post-op Vital signs reviewed  Post-op Vital Signs: Reviewed  Complications: No apparent anesthesia complications

## 2011-06-11 NOTE — Progress Notes (Signed)
Pt on heart monitor and pulse ox during central line. Oxygen saturations maintained at 98- 100% on 4 lpm oxygen via [place]. Pt sinus tach on monitor rate 100- 107

## 2011-06-11 NOTE — OR Nursing (Signed)
Pre-procedure Video Bronchoscopy charted by mistake.

## 2011-06-11 NOTE — Preoperative (Signed)
Beta Blockers   Reason not to administer Beta Blockers:Not Applicable 

## 2011-06-12 ENCOUNTER — Inpatient Hospital Stay (HOSPITAL_COMMUNITY): Payer: Medicare Other

## 2011-06-12 LAB — BASIC METABOLIC PANEL
CO2: 24 mEq/L (ref 19–32)
Chloride: 106 mEq/L (ref 96–112)
Potassium: 3.4 mEq/L — ABNORMAL LOW (ref 3.5–5.1)
Sodium: 137 mEq/L (ref 135–145)

## 2011-06-12 LAB — CBC
HCT: 31.2 % — ABNORMAL LOW (ref 36.0–46.0)
Hemoglobin: 10.5 g/dL — ABNORMAL LOW (ref 12.0–15.0)
MCH: 31.3 pg (ref 26.0–34.0)
MCHC: 33.7 g/dL (ref 30.0–36.0)
MCV: 93.1 fL (ref 78.0–100.0)
Platelets: 223 10*3/uL (ref 150–400)

## 2011-06-12 LAB — POCT I-STAT 3, ART BLOOD GAS (G3+)
Acid-base deficit: 2 mmol/L (ref 0.0–2.0)
Bicarbonate: 23.2 mEq/L (ref 20.0–24.0)
O2 Saturation: 91 %
Patient temperature: 99.6
TCO2: 24 mmol/L (ref 0–100)
pCO2 arterial: 39.6 mmHg (ref 35.0–45.0)
pH, Arterial: 7.379 (ref 7.350–7.400)
pO2, Arterial: 65 mmHg — ABNORMAL LOW (ref 80.0–100.0)

## 2011-06-12 LAB — MRSA PCR SCREENING: MRSA by PCR: POSITIVE — AB

## 2011-06-12 MED ORDER — CHLORHEXIDINE GLUCONATE CLOTH 2 % EX PADS
6.0000 | MEDICATED_PAD | Freq: Every day | CUTANEOUS | Status: DC
  Administered 2011-06-13 – 2011-06-14 (×2): 6 via TOPICAL

## 2011-06-12 MED ORDER — MUPIROCIN 2 % EX OINT
1.0000 "application " | TOPICAL_OINTMENT | Freq: Two times a day (BID) | CUTANEOUS | Status: AC
  Administered 2011-06-12 – 2011-06-16 (×10): 1 via NASAL
  Filled 2011-06-12: qty 22

## 2011-06-12 MED ORDER — POTASSIUM CHLORIDE 10 MEQ/50ML IV SOLN
INTRAVENOUS | Status: AC
  Administered 2011-06-12: 10 meq via INTRAVENOUS
  Filled 2011-06-12: qty 150

## 2011-06-12 MED ORDER — POTASSIUM CHLORIDE 10 MEQ/50ML IV SOLN
10.0000 meq | INTRAVENOUS | Status: AC | PRN
  Administered 2011-06-12 – 2011-06-13 (×4): 10 meq via INTRAVENOUS

## 2011-06-12 MED ORDER — FUROSEMIDE 10 MG/ML IJ SOLN
20.0000 mg | Freq: Once | INTRAMUSCULAR | Status: AC
  Administered 2011-06-12: 20 mg via INTRAVENOUS
  Filled 2011-06-12: qty 2

## 2011-06-12 MED ORDER — ENOXAPARIN SODIUM 30 MG/0.3ML ~~LOC~~ SOLN
30.0000 mg | SUBCUTANEOUS | Status: DC
  Administered 2011-06-12 – 2011-06-18 (×7): 30 mg via SUBCUTANEOUS
  Filled 2011-06-12 (×7): qty 0.3

## 2011-06-12 NOTE — Progress Notes (Addendum)
C/o severe headache that started around 2115, O2 sats in mid 80's on 100% NRB. Dr. Cornelius Moras updated on status during rounds. Order for bi-pap per respiratory and lasix received.  Explained to patient what bi-pap was and how it would help her. Respiratory to bedside to start bi-pap at 2205. With start of bi-pap O2 sats immediately increased into upper 90's. After about , she says her headache is improving. Called patient's mother and updated on status. Explained bi-pap. Answered questions. Will continue to monitor patient status. Thresa Ross RN

## 2011-06-12 NOTE — Progress Notes (Signed)
Respiratory Therapy note-Late entry- pt met all criteria to be extubated, positive cuff leak, awake, following commands and anesthesia at bedside for extubation. Pt needed to be placed on NRB prior to leaving the PACU, and report was given to next therapist.

## 2011-06-12 NOTE — Progress Notes (Signed)
[facility] BRIEF SICU PROGRESS NOTE  1 Day Post-Op  S/P Procedure(s) (LRB): VIDEO ASSISTED THORACOSCOPY (Right) RESECTION OF APICAL BLEB (Right)   Fairly good pain control but still with high O2 requirement, not taking deep breaths Currently O2 sats 87-90% on 100% NRB facemask BP stable.  UOP marginal  Plan: Encourage deep breaths, may need BiPAP.  Will give 1 dose lasix.  [name] H 06/12/2011 9:44 PM

## 2011-06-12 NOTE — Progress Notes (Addendum)
O2 saturation dropping and holding in 80's. No distress noted. Patient says she does not feel short of breath. Breath sounds clear and equal. No air leak or tidaling noted in chest tube. Chest tube not kinked. Will continue to monitor. Thresa Ross RN

## 2011-06-12 NOTE — Progress Notes (Signed)
UR Completed.  Rhonda Ramos [name] [phone] 06/12/2011  

## 2011-06-12 NOTE — Progress Notes (Signed)
Patient ID: Rhonda Ramos, female   DOB: 05-04-1966, 46 y.o.   MRN: [id number] [facility] DAILY PROGRESS NOTE                   [address]            [place] Ramos [address]          [phone]      1 Day Post-Op Procedure(s) (LRB): VIDEO ASSISTED THORACOSCOPY (Right)  Total Length of Stay:  LOS: 2 days   Subjective: Good resp effort, surprise how comfortable and less anxious today then yesterday  Objective: Vital signs in last 24 hours: Temp:  [97.6 F (36.4 C)-99.6 F (37.6 C)] 99 F (37.2 C) (02/07 0820) Pulse Rate:  [3-107] 3  (02/07 0820) Cardiac Rhythm:  [-] Normal sinus rhythm (02/07 0400) Resp:  [12-25] 20  (02/07 0700) BP: (102-152)/(54-108) 114/69 mmHg (02/07 0700) SpO2:  [84 %-100 %] 97 % (02/07 0700) Arterial Line BP: (106-136)/(51-72) 106/51 mmHg (02/07 0700) FiO2 (%):  [50 %-100 %] 100 % (02/07 0700) Weight:  [117 lb 1 oz (53.1 kg)] 117 lb 1 oz (53.1 kg) (02/07 0600)  Filed Weights   06/10/11 2234 06/12/11 0600  Weight: 115 lb 8.3 oz (52.4 kg) 117 lb 1 oz (53.1 kg)    Weight change: 1 lb 8.7 oz (0.7 kg)   Hemodynamic parameters for last 24 hours:    Intake/Output from previous day: 02/06 0701 - 02/07 0700 In: 4467.8 [I.V.:4217.8; IV Piggyback:250] Out: 4905 [Urine:4535; Chest Tube:370]  Intake/Output this shift: Total I/O In: 50 [IV Piggyback:50] Out: -   Current Meds: Scheduled Meds:   . acetaminophen  1,000 mg Intravenous Q6H  . amLODipine  5 mg Oral Daily  . bisacodyl  10 mg Oral Daily  . cefUROXime (ZINACEF)  IV  1.5 g Intravenous On Call to OR  . cefUROXime (ZINACEF)  IV  1.5 g Intravenous Q12H  . clonazePAM  0.5 mg Oral BID  . fentaNYL   Intravenous Q4H  . FLUoxetine  60 mg Oral Daily  . levETIRAcetam  250 mg Oral Daily  . multivitamins ther. w/minerals  1 tablet Oral Daily  . norethindrone  1 tablet Oral Daily  . norethindrone  1 tablet Oral Once  . oxybutynin  10 mg Oral QHS  . DISCONTD: FLUoxetine  60 mg Oral Daily  .  DISCONTD: LORazepam  1 mg Oral BID   Continuous Infusions:   . 0.9 % NaCl with KCl 20 mEq / L 125 mL/hr (06/12/11 0439)  . DISCONTD: sodium chloride 125 mL/hr at 06/10/11 2055  . DISCONTD: bupivacaine 0.5 % ON-Q pump SINGLE CATH 400 mL     PRN Meds:.diphenhydrAMINE, diphenhydrAMINE, iohexol, naloxone, ondansetron (ZOFRAN) IV, ondansetron (ZOFRAN) IV, oxyCODONE, oxyCODONE-acetaminophen, potassium chloride, potassium chloride, senna-docusate, sodium chloride, traMADol, white petrolatum, DISCONTD: 0.9 % irrigation (POUR BTL), DISCONTD: bupivacaine, DISCONTD: bupivacaine 0.5 % ON-Q pump SINGLE CATH 400 mL, DISCONTD: bupivacaine-EPINEPHrine, DISCONTD: fentaNYL, DISCONTD: fentaNYL DISCONTD: meperidine, DISCONTD: midazolam, DISCONTD: midazolam, DISCONTD: oxyCODONE, DISCONTD: promethazine  General appearance: alert, cooperative and no distress Neurologic: intact Heart: regular rate and rhythm, S1, S2 normal, no murmur, click, rub or gallop Lungs: clear to auscultation bilaterally Abdomen: soft, non-tender; bowel sounds normal; no masses,  no organomegaly no air leak  Lab Results: CBC: Basename 06/12/11 0406 06/11/11 0554  WBC 10.9* 9.5  HGB 10.5* 13.2  HCT 31.2* 38.8  PLT 223 282   BMET:  Basename 06/12/11 0406 06/11/11 0554  NA 137 140  K 3.4* 3.4*  CL 106 108  CO2 24 21  GLUCOSE 106* 94  BUN 4* 9  CREATININE 0.62 0.70  CALCIUM 8.2* 9.1    PT/INR:  Basename 06/11/11 0554  LABPROT 14.2  INR 1.08   Radiology: Dg Chest 2 View  06/10/2011  *RADIOLOGY REPORT*  Clinical Data: History of pneumothorax in November.  Shortness of breath.  Ex-smoker.  History of hypertension.  Right-sided chest pain.  CHEST - 2 VIEW  Comparison: Chest x-ray 04/15/2011.  Findings: Large right-sided hydropneumothorax (greater than 90% of lung volume loss, small effusion component).  Interstitial prominence throughout the left lung.  Heart size is normal. Mediastinal contours are unremarkable (specifically, no  shift of mediastinal contents to suggest a tension pneumothorax at this time).  IMPRESSION:  1.  Large right-sided hydropneumothorax, as above.  Critical Value/emergent results were called by telephone at the time of interpretation on 06/10/2011  at 04:55 p.m.  to  Dr. Everardo All, who verbally acknowledged these results.  Original Report Authenticated By: Florencia Reasons, M.D.   Ct Chest W Contrast  06/11/2011  *RADIOLOGY REPORT*  Clinical Data: Evaluate for pulmonary bleb  CT CHEST WITH CONTRAST  Technique:  Multidetector CT imaging of the chest was performed following the standard protocol during bolus administration of intravenous contrast.  Contrast: 80mL OMNIPAQUE IOHEXOL 300 MG/ML IV SOLN  Comparison: 04/04/2011  Findings:  There is a right chest tube in place.  A small amount of subcutaneous emphysema is identified at the chest tube insertion site.  No enlarged axillary or supraclavicular lymph nodes.  There is no mediastinal or hilar adenopathy.  No pericardial effusion or pleural effusion noted.  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  There is a loculated air collection in the posterior medial right hemithorax that is favored to represent the loculated, residual pneumothorax.  This currently measures 2.2 x 6.8 cm.  Previously 1.8 x 5.7 cm.  Alternatively, as mentioned previously this could represent a large bleb.  Slightly more cranially there is a pulmonary bleb measuring 4.4 x 2.9 cm, image 16.  Unchanged from prior examination.  Left upper lobe bleb measures 6.7 x 4.9 cm.  Similar appearance of subpleural consolidation within the medial left lung base and within the right lower lobe overlying the air collection.  Several tiny foci of pneumomediastinum are identified, image 31 and image 29.  IMPRESSION:  1.  Loculated pneumothorax versus pulmonary bleb within the posterior medial right hemothorax is similar in size from previous examination. 2.  Bilateral blebs and bibasilar atelectasis, similar to previous  exam. 3.  Right chest tube in place. 4.  Small amount of pneumomediastinum.  Original Report Authenticated By: Rosealee Albee, M.D.   Dg Chest Port 1 View  06/12/2011  *RADIOLOGY REPORT*  Clinical Data: Post op VATS, chest tube  PORTABLE CHEST - 1 VIEW  Comparison: 06/11/2011; 06/10/2011; chest CT - 06/11/2011  Findings: Grossly unchanged cardiac silhouette and mediastinal contours.  Interval extubation.  Stable position of support apparatus.  Lung volumes remain persistently reduced with bibasilar heterogeneous opacities. Again, right-sided pleural effusion has markedly decreased post repositioning of right-sided chest tube. Interval reduction and right lateral chest wall subcutaneous emphysema.  No definite pneumothorax.  Grossly unchanged biapical bullous emphysematous change and perihilar/bibasilar heterogeneous opacities.  No definite pleural effusion.  Grossly unchanged bones.  IMPRESSION: 1.  Interval extubation.  Otherwise, stable positioning of support apparatus. No pneumothorax.  2. Persistent reduction, near resolution of previously noted right- sided pleural effusion post recent repositioning of  right-sided chest tube.  3.  Lung volumes remain persistently reduced with grossly unchanged findings of biapical bullous emphysematous change and perihilar/bibasilar opacities, atelectasis versus infiltrate.  Original Report Authenticated By: Waynard Reeds, M.D.   Dg Chest Portable 1 View  06/11/2011  *RADIOLOGY REPORT*  Clinical Data: Status post VATS.  PORTABLE CHEST - 1 VIEW  Comparison: Plain film chest 06/10/2011 and CT chest 06/11/2011.  Findings: Endotracheal tube is in place the tip just below the clavicular heads in good position.  Right IJ catheter has its tip in the lower superior vena cava.  Right chest tube is in place.  No pneumothorax is identified.  There is some subcutaneous air along the right chest wall.  Lungs demonstrate changes of bullous emphysema.  Bilateral airspace disease in  the mid and lower lung zones is noted.  IMPRESSION:  1.  Support tubes and lines in good position.  No pneumothorax. 2.  Bilateral airspace disease predominantly in the bases could be due to atelectasis or infection.  Original Report Authenticated By: Bernadene Bell. D'ALESSIO, M.D.   Dg Chest Portable 1 View  06/10/2011  *RADIOLOGY REPORT*  Clinical Data: Shortness of breath, pneumothorax, post chest tube placement  PORTABLE CHEST - 1 VIEW  Comparison: Earlier film of the same day  Findings: A right chest tube is in place, directed towards the hilum.  There has been interval evacuation of the previously noted pneumothorax.  There is some residual patchy atelectasis or consolidation  at the right lung base.  Coarse perihilar interstitial markings as before.  Heart size within normal limits. 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  Cannot exclude small right pleural effusion.  IMPRESSION:  1.  Right chest tube placement with evacuation of pneumothorax. 2.  Patchy atelectasis or infiltrate at the right lung base.  The findings were reviewed with Dr. Weldon Inches on the phone at the time of interpretation.  Original Report Authenticated By: Osa Craver, M.D.   Dg Chest Port 1 View  06/10/2011  *RADIOLOGY REPORT*  Clinical Data: Increased shortness of breath.  PORTABLE CHEST - 1 VIEW  Comparison: Portable film 1 hour earlier.  Findings: Large right pneumothorax persists, greater than 90% lung volume loss.  Small effusion component not well visualized on this film.  Little change from film 1 hour earlier.   No mediastinal shift or tension component. Mild basilar atelectasis left lung.  IMPRESSION: Large right pneumothorax.  Findings discussed with Dr. Weldon Inches.  Original Report Authenticated By: Elsie Stain, M.D.     Assessment/Plan: S/P Procedure(s) (LRB): VIDEO ASSISTED THORACOSCOPY (Right) Ct to water seal ambulate Dc foley      Delight Ovens  MD  [facility]eeper [phone] Office [phone] 06/12/2011 8:40 AM

## 2011-06-13 ENCOUNTER — Inpatient Hospital Stay (HOSPITAL_COMMUNITY): Payer: Medicare Other

## 2011-06-13 ENCOUNTER — Encounter (HOSPITAL_COMMUNITY): Payer: Self-pay | Admitting: Cardiothoracic Surgery

## 2011-06-13 DIAGNOSIS — J9600 Acute respiratory failure, unspecified whether with hypoxia or hypercapnia: Secondary | ICD-10-CM

## 2011-06-13 DIAGNOSIS — J930 Spontaneous tension pneumothorax: Secondary | ICD-10-CM

## 2011-06-13 DIAGNOSIS — R0902 Hypoxemia: Secondary | ICD-10-CM

## 2011-06-13 LAB — COMPREHENSIVE METABOLIC PANEL
ALT: 10 U/L (ref 0–35)
CO2: 29 mEq/L (ref 19–32)
Calcium: 9.3 mg/dL (ref 8.4–10.5)
GFR calc Af Amer: >90 mL/min (ref >90)
GFR calc non Af Amer: >90 mL/min (ref >90)
Glucose, Bld: 94 mg/dL (ref 70–99)
Sodium: 139 mEq/L (ref 135–145)

## 2011-06-13 LAB — BLOOD GAS, ARTERIAL
Delivery systems: POSITIVE
TCO2: 28.6 mmol/L (ref 0–100)
pCO2 arterial: 46.2 mmHg — ABNORMAL HIGH (ref 35.0–45.0)
pH, Arterial: 7.388 (ref 7.350–7.400)

## 2011-06-13 LAB — CBC
Hemoglobin: 11.8 g/dL — ABNORMAL LOW (ref 12.0–15.0)
MCH: 31.6 pg (ref 26.0–34.0)
MCV: 93.8 fL (ref 78.0–100.0)
RBC: 3.73 MIL/uL — ABNORMAL LOW (ref 3.87–5.11)

## 2011-06-13 MED ORDER — FUROSEMIDE 10 MG/ML IJ SOLN
20.0000 mg | Freq: Once | INTRAMUSCULAR | Status: AC
  Administered 2011-06-13: 20 mg via INTRAVENOUS
  Filled 2011-06-13: qty 2

## 2011-06-13 MED ORDER — CALAMINE EX LOTN
TOPICAL_LOTION | CUTANEOUS | Status: DC | PRN
  Administered 2011-06-14 – 2011-06-16 (×2): via TOPICAL
  Filled 2011-06-13: qty 118

## 2011-06-13 MED ORDER — POTASSIUM CHLORIDE 10 MEQ/50ML IV SOLN
10.0000 meq | INTRAVENOUS | Status: AC | PRN
  Administered 2011-06-13 – 2011-06-15 (×3): 10 meq via INTRAVENOUS

## 2011-06-13 MED ORDER — MIDAZOLAM HCL 2 MG/2ML IJ SOLN
1.0000 mg | INTRAMUSCULAR | Status: DC | PRN
  Administered 2011-06-13 – 2011-06-15 (×9): 1 mg via INTRAVENOUS
  Filled 2011-06-13 (×6): qty 2

## 2011-06-13 MED ORDER — POTASSIUM CHLORIDE 10 MEQ/50ML IV SOLN
INTRAVENOUS | Status: AC
  Administered 2011-06-13: 10 meq via INTRAVENOUS
  Filled 2011-06-13: qty 150

## 2011-06-13 NOTE — Evaluation (Signed)
Physical Therapy Evaluation Patient Details Name: Rhonda Ramos MRN: [id number] DOB: [birth year] Today's Date: 06/13/2011  Problem List:  Patient Active Problem List  Diagnoses  . DEPRESSION  . CHRONIC PAIN SYNDROME  . HYPERTENSION  . GERD  . SPINA BIFIDA  . URINARY INCONTINENCE  . Seizure  . Abnormal Pap smear of cervix  . Pneumothorax, right    Past Medical History:  Past Medical History  Diagnosis Date  . Chronic pain syndrome     s/p rehab - narc detox fall 2011  . SPINA BIFIDA   . Scoliosis     multiple surg for same  . DEPRESSION     > ptsd (spouse suicide/gsw 11/2005  . GERD   . HYPERTENSION   . Anxiety   . Arthritis   . Pneumothorax on right 04/03/11    large  . Seizures     onset fall 2011 with narc detox  . Chronic back pain greater than 3 months duration   . Heart murmur     "in my mid teens"  . Bronchitis     h/o  . Pneumonia     h/o  . Chronic kidney disease     "left kidney is ~ 25 cent sized; self caths"  . Headache   . UTI (lower urinary tract infection)    Past Surgical History:  Past Surgical History  Procedure Date  . Lumbar laminectomy for tethered cord release     x's 4 (84,94,98, and 02)  . 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 Scoliosis 1982    neck fused w/bones  . Chest tube placement 04/03/11    S/P large right sided pneumo  . Back surgery   . Video assisted thoracoscopy 06/11/2011    Procedure: VIDEO ASSISTED THORACOSCOPY;  Surgeon: Delight Ovens, MD;  Location: [facility] OR;  Service: Thoracic;  Laterality: Right;  Stapling of Large Apical and Inferiors blebs; Mechanical Pleurodesis   . Resection of apical bleb 06/11/2011    Procedure: RESECTION OF APICAL BLEB;  Surgeon: Delight Ovens, MD;  Location: [facility] OR;  Service: Thoracic;  Laterality: Right;  and inferior bleb    PT Assessment/Plan/Recommendation PT Assessment H&P: 46 yr old WF h/o Spina Bifida ( related scoliosis) and prior spont PTX November resolved with Chest tube.  Re presented 2/5 with chest pain rt.  Presented to primary care provider with chest pain and sob, pcxr - large rt PTX. Chest tube placed. VATS procedure performed with bleb staple 2/6.  POst op has had uintermittent desaturation and now acute resp failure. No fever, no leak , no secretions issues. Multiple back surgery for spina bifida in past. Pt appears very anxious. Also suffers from chronic pain and narcotic addition from back. Clinical Impression Statement: Presents to PT today very agitated and anxious requiring a lot of redirection and calming techniques as HR was elevated to 130s and when her venturi mask was removed she would desat to mid 80s. Limited mostly by anxiety but also demonstrating balance deficits and generalized weakness. Will benefit physical therapy in the acute setting to maximize mobility and independence so as to increase safety at home. D/c rec depend on pt's progress. She is very intereseted in going home with her mother to do therapy but at this piont I think she would benefit CIR. If she progresses well she could go home but only with 24 hour assist.  PT Recommendation/Assessment: Patient will need skilled PT in the acute care venue  PT Problem List: Decreased strength;Decreased activity tolerance;Decreased balance;Pain;Cardiopulmonary status limiting activity;Decreased cognition;Decreased mobility Barriers to Discharge: Decreased caregiver support Barriers to Discharge Comments: lives alone (could possibly stay with her mother when she leaves) PT Therapy Diagnosis : Difficulty walking;Abnormality of gait;Generalized weakness;Altered mental status;Acute pain PT Plan PT Frequency: Min 3X/week PT Treatment/Interventions: DME instruction;Gait training;Functional mobility training;Neuromuscular re-education;Balance training;Therapeutic exercise;Therapeutic activities;Patient/family education;Cognitive remediation PT Recommendation Recommendations for Other Services: OT  consult Follow Up Recommendations: Inpatient Rehab vs Home health PT and Supervision/Assistance - 24 hour (pending pt's progress) Equipment Recommended: Defer to next venue PT Goals  Acute Rehab PT Goals PT Goal Formulation: With patient Time For Goal Achievement: 2 weeks Pt will go Supine/Side to Sit: with modified independence PT Goal: Supine/Side to Sit - Progress: Goal set today Pt will go Sit to Supine/Side: with modified independence PT Goal: Sit to Supine/Side - Progress: Goal set today Pt will go Sit to Stand: with modified independence PT Goal: Sit to Stand - Progress: Goal set today Pt will go Stand to Sit: with modified independence PT Goal: Stand to Sit - Progress: Goal set today Pt will Stand: with modified independence PT Goal: Stand - Progress: Goal set today Pt will Ambulate: 16 - 50 feet;with modified independence;with rolling walker PT Goal: Ambulate - Progress: Goal set today  PT Evaluation Precautions/Restrictions  Precautions Precautions: Fall Precaution Comments: pt with baseline spina bifida, ambulates with RW and bilateral leg braces Prior Functioning  Home Living Lives With: Alone (takes care of her dog) Receives Help From: Family (plans to stay with her mother on d/c) Type of Home: House Home Layout: One level Home Access: Ramped entrance Prior Function Level of Independence: Independent with basic ADLs;Requires assistive device for independence;Independent with transfers Vocation: On disability Cognition Cognition Arousal/Alertness: Awake/alert Overall Cognitive Status: Impaired Attention: Impaired Current Attention Level: Selective Attention - Other Comments: pt very focused on her anxiety, needs a lot of redirection Orientation Level: Oriented X4 Sensation/Coordination Sensation Light Touch: Impaired by gross assessment (left foot numb at baseline) Coordination Gross Motor Movements are Fluid and Coordinated: Yes Fine Motor Movements are  Fluid and Coordinated: Yes Extremity Assessment RUE Assessment RUE Assessment:  (grossly 3-4/5) LUE Assessment LUE Assessment:  (grossly 3-4/5) RLE Strength RLE Overall Strength: Deficits RLE Overall Strength Comments: bilateral foot deformities from spina bifida which pt wears AFO (flat foot and appear more abducted at hips); moving bilateral knees and hips in flex/extension and right foot able to perform DF/PF actively LLE Strength LLE Overall Strength Comments: No active DF/PF (baseline), see RLE for more details  Mobility (including Balance) Bed Mobility Bed Mobility: Yes Supine to Sit: 4: Min assist Supine to Sit Details (indicate cue type and reason): pt very insistent of doing things as independently as possible; min tactile assist for follow through Sitting - Scoot to Edge of Bed: 3: Mod assist Sitting - Scoot to Edge of Bed Details (indicate cue type and reason): use of pad to scoot Transfers Transfers: Yes Stand Pivot Transfers: 1: +2 Total assist (70%) Stand Pivot Transfer Details (indicate cue type and reason): bilateral assist to steady pt as she stepped to chair; has baseline 0/5 strength for DF on left (wears AFO's bilaterally but did not have this on during transfer); bilateral faciliation to bring patient anteriorly Ambulation/Gait Ambulation/Gait: No (pt too anxious today)  Posture/Postural Control Posture/Postural Control: Postural limitations Postural Limitations: scoliosis/spina bifida at basline (bilateral LE significantly smaller proportion than upper body/upper extremities) Balance Balance Assessed: Yes Static Sitting Balance Static Sitting -  Balance Support: Bilateral upper extremity supported Static Sitting - Level of Assistance: 4: Min assist Static Sitting - Comment/# of Minutes: sat EOB x2 minutes Exercise    End of Session PT - End of Session Equipment Utilized During Treatment: Gait belt Activity Tolerance: Treatment limited secondary to medical  complications (Comment) (ANXIOUS) Patient left: in chair;with call bell in reach Nurse Communication: Mobility status for transfers General Behavior During Session: Agitated (very axious and preoccupied with a prior issue with nurse) Cognition: Impaired Cognitive Impairment: preoccupied, needs a lot of redirection because of anxiety; definitely interested in going home  [facility] HELEN 06/13/2011, 4:24 PM

## 2011-06-13 NOTE — Progress Notes (Signed)
PT Cancellation Note  Treatment cancelled today due to pt currently agitated with HR 130s supine in bed. Will f/u today as time allows. Humberto Seals HELEN 06/13/2011, 10:25 AM [phone]

## 2011-06-13 NOTE — Progress Notes (Signed)
Patient ID: Rhonda Ramos, female   DOB: October 19, 1965, 46 y.o.   MRN: [id number] TCTS DAILY PROGRESS NOTE                   [address]            [place] [address]          [phone]      2 Days Post-Op Procedure(s) (LRB): VIDEO ASSISTED THORACOSCOPY (Right) RESECTION OF APICAL BLEB (Right)  Total Length of Stay:  LOS: 3 days   Subjective: Decreased sats  On BIPAP  Objective: Vital signs in last 24 hours: Temp:  [98.5 F (36.9 C)-99.7 F (37.6 C)] 98.5 F (36.9 C) (02/08 0744) Pulse Rate:  [3-114] 103  (02/08 0744) Cardiac Rhythm:  [-] Normal sinus rhythm (02/08 0300) Resp:  [14-25] 23  (02/08 0744) BP: (99-127)/(63-92) 117/70 mmHg (02/08 0744) SpO2:  [78 %-99 %] 99 % (02/08 0744) Arterial Line BP: (95-133)/(55-86) 111/79 mmHg (02/07 1200) FiO2 (%):  [50 %-100 %] 100 % (02/08 0744) Weight:  [117 lb 8 oz (53.298 kg)] 117 lb 8 oz (53.298 kg) (02/08 0400)  Filed Weights   06/10/11 2234 06/12/11 0600 06/13/11 0400  Weight: 115 lb 8.3 oz (52.4 kg) 117 lb 1 oz (53.1 kg) 117 lb 8 oz (53.298 kg)    Weight change: 7 oz (0.198 kg)   Hemodynamic parameters for last 24 hours:    Intake/Output from previous day: 02/07 0701 - 02/08 0700 In: 2095.5 [I.V.:593.5; IV Piggyback:102] Out: 3050 [Urine:3020; Chest Tube:30]  Intake/Output this shift: Total I/O In: 50 [IV Piggyback:50] Out: -   Current Meds: Scheduled Meds:   . acetaminophen  1,000 mg Intravenous Q6H  . amLODipine  5 mg Oral Daily  . bisacodyl  10 mg Oral Daily  . cefUROXime (ZINACEF)  IV  1.5 g Intravenous Q12H  . Chlorhexidine Gluconate Cloth  6 each Topical Q0600  . clonazePAM  0.5 mg Oral BID  . enoxaparin  30 mg Subcutaneous Q24H  . fentaNYL   Intravenous Q4H  . FLUoxetine  60 mg Oral Daily  . furosemide  20 mg Intravenous Once  . levETIRAcetam  250 mg Oral Daily  . multivitamins ther. w/minerals  1 tablet Oral Daily  . mupirocin ointment  1 application Nasal BID  . norethindrone  1  tablet Oral Daily  . oxybutynin  10 mg Oral QHS   Continuous Infusions:   . 0.9 % NaCl with KCl 20 mEq / L 20 mL/hr at 06/12/11 0905   PRN Meds:.diphenhydrAMINE, diphenhydrAMINE, naloxone, ondansetron (ZOFRAN) IV, ondansetron (ZOFRAN) IV, oxyCODONE, oxyCODONE-acetaminophen, potassium chloride, potassium chloride, potassium chloride, senna-docusate, sodium chloride, traMADol, white petrolatum  General appearance: alert, cooperative and mild distress Neurologic: intact Heart: regular rate and rhythm, S1, S2 normal, no murmur, click, rub or gallop Lungs: diminished breath sounds bilaterally Abdomen: soft, non-tender; bowel sounds normal; no masses,  no organomegaly Wound: no air leak On bipap for decreased sats Lab Results: CBC: Basename 06/13/11 0440 06/12/11 0406  WBC 11.3* 10.9*  HGB 11.8* 10.5*  HCT 35.0* 31.2*  PLT 233 223   BMET:  Basename 06/13/11 0440 06/12/11 0406  NA 139 137  K 3.6 3.4*  CL 103 106  CO2 29 24  GLUCOSE 94 106*  BUN 5* 4*  CREATININE 0.66 0.62  CALCIUM 9.3 8.2*    PT/INR:  Basename 06/11/11 0554  LABPROT 14.2  INR 1.08   Radiology:  Chest xray pending today  Ct Chest W Contrast  06/11/2011  *RADIOLOGY REPORT*  Clinical Data: Evaluate for pulmonary bleb  CT CHEST WITH CONTRAST  Technique:  Multidetector CT imaging of the chest was performed following the standard protocol during bolus administration of intravenous contrast.  Contrast: 80mL OMNIPAQUE IOHEXOL 300 MG/ML IV SOLN  Comparison: 04/04/2011  Findings:  There is a right chest tube in place.  A small amount of subcutaneous emphysema is identified at the chest tube insertion site.  No enlarged axillary or supraclavicular lymph nodes.  There is no mediastinal or hilar adenopathy.  No pericardial effusion or pleural effusion noted.  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  There is a loculated air collection in the posterior medial right hemithorax that is favored to represent the loculated, residual  pneumothorax.  This currently measures 2.2 x 6.8 cm.  Previously 1.8 x 5.7 cm.  Alternatively, as mentioned previously this could represent a large bleb.  Slightly more cranially there is a pulmonary bleb measuring 4.4 x 2.9 cm, image 16.  Unchanged from prior examination.  Left upper lobe bleb measures 6.7 x 4.9 cm.  Similar appearance of subpleural consolidation within the medial left lung base and within the right lower lobe overlying the air collection.  Several tiny foci of pneumomediastinum are identified, image 31 and image 29.  IMPRESSION:  1.  Loculated pneumothorax versus pulmonary bleb within the posterior medial right hemothorax is similar in size from previous examination. 2.  Bilateral blebs and bibasilar atelectasis, similar to previous exam. 3.  Right chest tube in place. 4.  Small amount of pneumomediastinum.  Original Report Authenticated By: Rosealee Albee, M.D.   Dg Chest Port 1 View  06/12/2011  *RADIOLOGY REPORT*  Clinical Data: Post op VATS, chest tube  PORTABLE CHEST - 1 VIEW  Comparison: 06/11/2011; 06/10/2011; chest CT - 06/11/2011  Findings: Grossly unchanged cardiac silhouette and mediastinal contours.  Interval extubation.  Stable position of support apparatus.  Lung volumes remain persistently reduced with bibasilar heterogeneous opacities. Again, right-sided pleural effusion has markedly decreased post repositioning of right-sided chest tube. Interval reduction and right lateral chest wall subcutaneous emphysema.  No definite pneumothorax.  Grossly unchanged biapical bullous emphysematous change and perihilar/bibasilar heterogeneous opacities.  No definite pleural effusion.  Grossly unchanged bones.  IMPRESSION: 1.  Interval extubation.  Otherwise, stable positioning of support apparatus. No pneumothorax.  2. Persistent reduction, near resolution of previously noted right- sided pleural effusion post recent repositioning of right-sided chest tube.  3.  Lung volumes remain  persistently reduced with grossly unchanged findings of biapical bullous emphysematous change and perihilar/bibasilar opacities, atelectasis versus infiltrate.  Original Report Authenticated By: Waynard Reeds, M.D.   Dg Chest Portable 1 View  06/11/2011  *RADIOLOGY REPORT*  Clinical Data: Status post VATS.  PORTABLE CHEST - 1 VIEW  Comparison: Plain film chest 06/10/2011 and CT chest 06/11/2011.  Findings: Endotracheal tube is in place the tip just below the clavicular heads in good position.  Right IJ catheter has its tip in the lower superior vena cava.  Right chest tube is in place.  No pneumothorax is identified.  There is some subcutaneous air along the right chest wall.  Lungs demonstrate changes of bullous emphysema.  Bilateral airspace disease in the mid and lower lung zones is noted.  IMPRESSION:  1.  Support tubes and lines in good position.  No pneumothorax. 2.  Bilateral airspace disease predominantly in the bases could be due to atelectasis or infection.  Original Report Authenticated By: Maisie Fus  L. Maricela Curet, M.D.     Assessment/Plan: S/P Procedure(s) (LRB): VIDEO ASSISTED THORACOSCOPY (Right) RESECTION OF APICAL BLEB (Right) Leave chest tube for now Will ask pulmonary to see Mild duresis    Delight Ovens MD  Beeper [phone] Office [phone] 06/13/2011 7:50 AM

## 2011-06-13 NOTE — Op Note (Signed)
Rhonda Ramos, ABDULAZIZ NO.:  [id number]  MEDICAL RECORD NO.:  [id number]  LOCATION:  2312                         FACILITY:  [facility]  PHYSICIAN:  Sheliah Plane, MD    DATE OF BIRTH:  28-Feb-1966  DATE OF PROCEDURE:  06/11/2011 DATE OF DISCHARGE:                              OPERATIVE REPORT   PREOPERATIVE DIAGNOSIS:  Recurrent right spontaneous pneumothorax.  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  PROCEDURE PERFORMED:  Right video-assisted thoracoscopy with stapling of large apical and medial lower lobe pulmonary blebs for recurrent pneumothorax.  SURGEON:  Sheliah Plane, MD  FIRST ASSISTANT:  Doree Fudge, PA  BRIEF HISTORY:  The patient is a 46 year old female with a long complicated medical history starting with spina bifida as a child.  She has had multiple spinal surgeries for tethered cord.  These multiple procedures and pain medications resulted in a period of narcotic addiction.  The patient was seen in November 2012 with a spontaneous right pneumothorax.  She was seen by Dr. Laneta Simmers at that time and the chest tube was placed.  The lung recovered and the chest tube was removed.  She returned on the day prior to surgery with a right chest discomfort and was found on chest x-ray to have 90% right pneumothorax. A #20 chest tube was placed in the emergency room by Dr. Weldon Inches, and the patient was then transferred to [facility] for further care and treatment.  Because of the second spontaneous pneumo, it was recommend to the patient that we proceed with video-assisted thoracoscopy.  CT scan revealed at least 2 very large 4-cm greater blebs, one at the apex, one immediately positioned at the lower lobe.  The patient agreed and signed the informed consent.  The risks and options were discussed with her in detail and expectation of the surgery to prevent continued recurrences was discussed  with her.  DESCRIPTION OF PROCEDURE:  With great difficulty and over prolonged period of time, Anesthesia was finally able to position an endotracheal tube with balloon blocker.  We then confirmed the patient's side and identity and she was turned with the right side up.  Because of her spina bifida resulting in scoliosis, her ribs and chest were somewhat deformed.  Initially, 3 port sites were created and with some difficulty Anesthesia was able to get the lung to deflate.  This gave [place] adequate visualization.  There was a large bleb at the apex, which was tethered and adherent to the chest wall just posterior to the superior vena cava. This was dissected out further and then using a 30-mm stapler, a portion of the lung that was a bleb and tethered to the chest wall was divided. This freed the upper lobe and gave good visualization of the large apical bleb.  An additional port for total of 4 port site was placed to manipulate the lung and staple it.  We were able to identify the edge of the bleb and used a #45 no-knife stapler and stapled across the base of the flap without removing the entire bleb.  With this completed, we then moved posteriorly immediately retracting the lower lobe  anterior, which revealed another large bleb.  In a similar fashion, this bleb was isolated and the base identified and stapled across with a no-knife stapler.  Following this, both neither of the blebs were full of air or did they leak air as we ventilated the lung.  A single #28 chest tube was left in place through one port site.  The remaining 3 port sites were closed with 2-0 Vicryl and the deep tissue in a 3-0 subcuticular stitch.  At the completion of the procedure with the lung reinflated, there was no evidence of air leak. Anesthesia failed at attempts to wean her and extubate her in the operating room and she was taken to the recovery room, still on the ventilator to attempt to extubate her in the  recovery room.  Blood loss was minimal.  The patient tolerated the procedure without obvious complication.     Sheliah Plane, MD     EG/MEDQ  D:  06/13/2011  T:  06/13/2011  Job:  [id number]

## 2011-06-13 NOTE — Consult Note (Signed)
Name: Rhonda Ramos MRN: [id number] DOB: 11-02-1965    LOS: 3  PCCM ADMISSION NOTE  History of Present Illness:   46 yr old WF h/o Spina Bifida ( related scoliosis) and prior spont PTX November resolved with Chest tube. Re presented 2/5 with chest pain rt.  Presented to primary care provider with chest pain and sob, pcxr - large rt PTX. Chest tube placed. VATS procedure performed with bleb staple 2/6.  POst op has had uintermittent desaturation and now acute resp failure. No fever, no leak , no secretions issues. Multiple back surgery for spina bifida in past. Pt appears very anxious. Also suffers from chronic pain and narcotic addition from back.  Lines / Drains: Rt chest tube 2/6>>>  Cultures: MRSA PCR pos  Antibiotics: Cefuroxime 2/6>>>2/7  Tests / Events: 2/6- VATS, bleb staple 2/8- acute hypoxic resp failure   Past Medical History  Diagnosis Date  . Chronic pain syndrome     s/p rehab - narc detox fall 2011  . SPINA BIFIDA   . Scoliosis     multiple surg for same  . DEPRESSION     > ptsd (spouse suicide/gsw 11/2005  . GERD   . HYPERTENSION   . Anxiety   . Arthritis   . Pneumothorax on right 04/03/11    large  . Seizures     onset fall 2011 with narc detox  . Chronic back pain greater than 3 months duration   . Heart murmur     "in my mid teens"  . Bronchitis     h/o  . Pneumonia     h/o  . Chronic kidney disease     "left kidney is ~ 25 cent sized; self caths"  . Headache   . UTI (lower urinary tract infection)    Past Surgical History  Procedure Date  . Lumbar laminectomy for tethered cord release     x's 4 (84,94,98, and 02)  . 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 Scoliosis 1982    neck fused w/bones  . Chest tube placement 04/03/11    S/P large right sided pneumo  . Back surgery    Prior to Admission medications   Medication Sig Start Date End Date Taking? Authorizing Provider  amLODipine (NORVASC) 5 MG tablet take 1 tablet by mouth once  daily 04/17/11  Yes Oliver Barre, MD  clonazePAM (KLONOPIN) 0.5 MG tablet Take 1 tablet (0.5 mg total) by mouth 2 (two) times daily as needed for anxiety. 05/16/11  Yes Rene Paci, MD  FLUoxetine (PROZAC) 20 MG tablet Take 3 tablets (60 mg total) by mouth daily. Take 3 by mouth once daily 12/23/10  Yes Rene Paci, MD  levETIRAcetam (KEPPRA) 250 MG tablet Take 250 mg by mouth daily.  11/13/10  Yes Historical Provider, MD  Multiple Vitamins-Minerals (MULTIVITAMINS THER. W/MINERALS) TABS Take 1 tablet by mouth daily. 04/06/11  Yes [name] Margaretann Loveless, PA  norethindrone (CAMILA) 0.35 MG tablet Take 1 tablet (0.35 mg total) by mouth daily. 08/28/10  Yes Rene Paci, MD  omeprazole (PRILOSEC) 20 MG capsule take 1 capsule by mouth once daily 01/12/11  Yes Rene Paci, MD  oxybutynin (DITROPAN-XL) 10 MG 24 hr tablet take 1 tablet by mouth once daily 05/20/11  Yes Rene Paci, MD   Allergies Allergies  Allergen Reactions  . Latex Shortness Of Breath  . Codeine Nausea And Vomiting  . Erythromycin Nausea And Vomiting    Family History Family History  Problem Relation  Age of Onset  . Diabetes Father   . Arthritis Other     parent & grandparent  . Diabetes Other     grandparent    Social History  reports that she has quit smoking. Her smoking use included Cigarettes. She has a 81 pack-year smoking history. She quit smokeless tobacco use about 16 months ago. She reports that she uses illicit drugs (Other-see comments). She reports that she does not drink alcohol.  Review Of Systems: reviewed, neg   Vital Signs: Temp:  [98.5 F (36.9 C)-99.7 F (37.6 C)] 98.5 F (36.9 C) (02/08 0744) Pulse Rate:  [92-114] 103  (02/08 0744) Resp:  [14-25] 23  (02/08 0744) BP: (99-127)/(63-92) 117/70 mmHg (02/08 0744) SpO2:  [78 %-99 %] 99 % (02/08 0744) Arterial Line BP: (100-133)/(55-86) 111/79 mmHg (02/07 1200) FiO2 (%):  [100 %] 100 % (02/08 0744) Weight:  [53.298 kg (117 lb 8 oz)]  53.298 kg (117 lb 8 oz) (02/08 0400) I/O last 3 completed shifts: In: 3763.3 [I.V.:2011.3; Other:1400; IV Piggyback:352] Out: 1610 [RUEAV:4098; Chest Tube:240]  Physical Examination: General:  Mild distress Neuro:  Awake, anxious HEENT:  jvd wnl Neck:  Line clean rt  Cardiovascular:  s1 s2 rrt  Lungs:  ronchi , reduced Abdomen:  Soft, BS wnl, malnurished  Ventilator settings: Vent Mode:  [-]  FiO2 (%):  [100 %] 100 %  Labs and Imaging:  Reviewed.  Please refer to the Assessment and Plan section for relevant results. CBC    Component Value Date/Time   WBC 11.3* 06/13/2011 0440   RBC 3.73* 06/13/2011 0440   HGB 11.8* 06/13/2011 0440   HCT 35.0* 06/13/2011 0440   PLT 233 06/13/2011 0440   MCV 93.8 06/13/2011 0440   MCH 31.6 06/13/2011 0440   MCHC 33.7 06/13/2011 0440   RDW 13.9 06/13/2011 0440   LYMPHSABS 2.4 04/03/2011 1425   MONOABS 0.7 04/03/2011 1425   EOSABS 0.1 04/03/2011 1425   BASOSABS 0.0 04/03/2011 1425    ABG    Component Value Date/Time   PHART 7.388 06/13/2011 0325   PCO2ART 46.2* 06/13/2011 0325   PO2ART 66.2* 06/13/2011 0325   HCO3 27.2* 06/13/2011 0325   TCO2 28.6 06/13/2011 0325   ACIDBASEDEF 2.0 06/12/2011 0403   O2SAT 92.9 06/13/2011 0325    BMET    Component Value Date/Time   NA 139 06/13/2011 0440   K 3.6 06/13/2011 0440   CL 103 06/13/2011 0440   CO2 29 06/13/2011 0440   GLUCOSE 94 06/13/2011 0440   BUN 5* 06/13/2011 0440   CREATININE 0.66 06/13/2011 0440   CALCIUM 9.3 06/13/2011 0440   GFRNONAA >90 06/13/2011 0440   GFRAA >90 06/13/2011 0440      Assessment and Plan: 1. Acute resp failure: multifactorial:  ATX in setting restrictive lung dz, bilateral airspace, r/o edema, small face with mal placed mask -I would suggest we schedule NIMV to prevent intubation and reduce progressive ATX -may in end need just pure cpap, will assess on bipap for now - agree lasix - no overt evidence infection -chest tubes per CVTS -pcxr in am  -have spoken to RT to try peds mask face, she is  loosing a ton of O2 and current  not reflective of actual needs -her baseline RR is elevated from restriction as well - will need to go to clears diet  2. Tachycardia -likely at baseline HR with restriction -agree lasix Treat anxiety  3. S/p VATS, bleb staple, spont PTX Per cvts, chest tube  no leak pcxr in am   4. Pain control -fent pca per prim, effective  5. Anxiety a big issue - will need versed prn while on nimv  Will follow in am   Best practices / Disposition: -->ICU status under PCCM -->full code -->lovenox -->Protonix for GI Px -->ventilator bundle -->diet to clears  The patient is critically ill with multiple organ systems failure and requires high complexity decision making for assessment and support, frequent evaluation and titration of therapies, application of advanced monitoring technologies and extensive interpretation of multiple databases. Critical Care Time devoted to patient care services described in this note is 45  minutes.  Nelda Bucks 06/13/2011, 8:50 AM  Mcarthur Rossetti. Tyson Alias, MD, FACP Pgr: [phone] [facility]

## 2011-06-14 ENCOUNTER — Inpatient Hospital Stay (HOSPITAL_COMMUNITY): Payer: Medicare Other

## 2011-06-14 LAB — BASIC METABOLIC PANEL
BUN: 9 mg/dL (ref 6–23)
CO2: 28 mEq/L (ref 19–32)
Calcium: 9.1 mg/dL (ref 8.4–10.5)
Chloride: 100 mEq/L (ref 96–112)
Creatinine, Ser: 0.66 mg/dL (ref 0.50–1.10)
GFR calc Af Amer: >90 mL/min (ref >90)
GFR calc non Af Amer: >90 mL/min (ref >90)
Glucose, Bld: 94 mg/dL (ref 70–99)
Potassium: 4 mEq/L (ref 3.5–5.1)
Sodium: 134 mEq/L — ABNORMAL LOW (ref 135–145)

## 2011-06-14 LAB — CBC
HCT: 32.7 % — ABNORMAL LOW (ref 36.0–46.0)
Hemoglobin: 11 g/dL — ABNORMAL LOW (ref 12.0–15.0)
MCH: 31.3 pg (ref 26.0–34.0)
MCHC: 33.6 g/dL (ref 30.0–36.0)
MCV: 93.2 fL (ref 78.0–100.0)
Platelets: 230 10*3/uL (ref 150–400)
RBC: 3.51 MIL/uL — ABNORMAL LOW (ref 3.87–5.11)
RDW: 14.2 % (ref 11.5–15.5)
WBC: 9.4 10*3/uL (ref 4.0–10.5)

## 2011-06-14 MED ORDER — CHLORHEXIDINE GLUCONATE CLOTH 2 % EX PADS
6.0000 | MEDICATED_PAD | Freq: Every day | CUTANEOUS | Status: AC
  Administered 2011-06-15 – 2011-06-17 (×3): 6 via TOPICAL

## 2011-06-14 MED ORDER — FUROSEMIDE 10 MG/ML IJ SOLN
20.0000 mg | Freq: Once | INTRAMUSCULAR | Status: AC
  Administered 2011-06-14: 20 mg via INTRAVENOUS
  Filled 2011-06-14: qty 2

## 2011-06-14 MED ORDER — FUROSEMIDE 10 MG/ML IJ SOLN
60.0000 mg | INTRAMUSCULAR | Status: AC
  Administered 2011-06-14: 60 mg via INTRAVENOUS
  Filled 2011-06-14: qty 6

## 2011-06-14 MED ORDER — BIOTENE DRY MOUTH MT LIQD
15.0000 mL | Freq: Two times a day (BID) | OROMUCOSAL | Status: DC
  Administered 2011-06-15 – 2011-06-17 (×5): 15 mL via OROMUCOSAL

## 2011-06-14 MED ORDER — FENTANYL 50 MCG/HR TD PT72: 50.0000 ug | MEDICATED_PATCH | TRANSDERMAL | Status: DC

## 2011-06-14 MED ORDER — MAGIC MOUTHWASH W/LIDOCAINE
5.0000 mL | Freq: Three times a day (TID) | ORAL | Status: DC | PRN
  Administered 2011-06-14: 5 mL via ORAL
  Filled 2011-06-14: qty 5

## 2011-06-14 MED ORDER — CHLORHEXIDINE GLUCONATE 0.12 % MT SOLN
15.0000 mL | Freq: Two times a day (BID) | OROMUCOSAL | Status: DC
  Administered 2011-06-15 – 2011-06-18 (×8): 15 mL via OROMUCOSAL
  Filled 2011-06-14 (×9): qty 15

## 2011-06-14 MED ORDER — OXYCODONE HCL 5 MG PO TABS
10.0000 mg | ORAL_TABLET | ORAL | Status: DC | PRN
  Administered 2011-06-14 – 2011-06-16 (×7): 10 mg via ORAL
  Administered 2011-06-16: 5 mg via ORAL
  Administered 2011-06-16 – 2011-06-18 (×11): 10 mg via ORAL
  Filled 2011-06-14 (×13): qty 2
  Filled 2011-06-14: qty 1
  Filled 2011-06-14 (×5): qty 2

## 2011-06-14 MED ORDER — FENTANYL 50 MCG/HR TD PT72
50.0000 ug | MEDICATED_PATCH | TRANSDERMAL | Status: DC
  Administered 2011-06-14 – 2011-06-17 (×2): 50 ug via TRANSDERMAL
  Filled 2011-06-14 (×2): qty 1

## 2011-06-14 NOTE — Progress Notes (Signed)
3 Days Post-Op Procedure(s) (LRB): VIDEO ASSISTED THORACOSCOPY (Right) RESECTION OF APICAL BLEB (Right) Subjective: OOB to chair  Objective: Vital signs in last 24 hours: Temp:  [97.9 F (36.6 C)-99.9 F (37.7 C)] 99.5 F (37.5 C) (02/09 1606) Pulse Rate:  [95-112] 111  (02/09 1600) Cardiac Rhythm:  [-] Sinus tachycardia (02/09 0800) Resp:  [13-33] 21  (02/09 1600) BP: (94-118)/(53-84) 118/57 mmHg (02/09 1600) SpO2:  [84 %-100 %] 97 % (02/09 1600) FiO2 (%):  [70 %-100 %] 100 % (02/09 1000)  Hemodynamic parameters for last 24 hours:    Intake/Output from previous day: 02/08 0701 - 02/09 0700 In: 1042.8 [P.O.:410; I.V.:532.8; IV Piggyback:100] Out: 1440 [Urine:1410; Chest Tube:30] Intake/Output this shift: Total I/O In: 626 [P.O.:420; I.V.:206] Out: 1520 [Urine:1500; Chest Tube:20]  Exam stable  Lab Results:  Basename 06/14/11 0503 06/13/11 0440  WBC 9.4 11.3*  HGB 11.0* 11.8*  HCT 32.7* 35.0*  PLT 230 233   BMET:  Basename 06/14/11 0503 06/13/11 0440  NA 134* 139  K 4.0 3.6  CL 100 103  CO2 28 29  GLUCOSE 94 94  BUN 9 5*  CREATININE 0.66 0.66  CALCIUM 9.1 9.3    PT/INR: No results found for this basename: LABPROT,INR in the last 72 hours ABG    Component Value Date/Time   PHART 7.388 06/13/2011 0325   HCO3 27.2* 06/13/2011 0325   TCO2 28.6 06/13/2011 0325   ACIDBASEDEF 2.0 06/12/2011 0403   O2SAT 92.9 06/13/2011 0325   CBG (last 3)  No results found for this basename: GLUCAP:3 in the last 72 hours  Assessment/Plan: S/P Procedure(s) (LRB): VIDEO ASSISTED THORACOSCOPY (Right) RESECTION OF APICAL BLEB (Right) Remove chest tube   LOS: 4 days    Rhonda Ramos,Rhonda Ramos 06/14/2011

## 2011-06-14 NOTE — Consult Note (Signed)
Name: Rhonda Ramos MRN: [id number] DOB: [birth year]    LOS: 4  PCCM FOLLOW UP CONSULT NOTE  History of Present Illness:   46 yr old WF h/o Spina Bifida (related scoliosis) and prior spont PTX November resolved with Chest tube. Presented 2/5 with chest pain rt.  Presented to primary care provider with chest pain and sob, pcxr - large rt PTX. Chest tube placed. VATS procedure performed with bleb staple 2/6.  Post op has had intermittent desaturation and now acute resp failure. No fever, no leak , no secretions issues. Multiple back surgery for spina bifida in past. Pt appears very anxious. Also suffers from chronic pain and narcotic addition from back.  Lines / Drains: Rt chest tube 2/6>>> R IJ TLC>>>  Cultures: MRSA PCR pos  Antibiotics: Cefuroxime 2/6>>>2/7  Tests / Events: 2/6- VATS, bleb staple 2/8- acute hypoxic resp failure  SUBJECTIVE "feeling better".  Denies shortness of breath, chest pain.  RN reports pt drops saturations to 80's when off bipap  Vital Signs: Filed Vitals:   06/14/11 0500 06/14/11 0600 06/14/11 0700 06/14/11 0756  BP:  99/65 109/78   Pulse: 101 97 97   Temp:    97.9 F (36.6 C)  TempSrc:    Axillary  Resp: 20 18 25    Height:      Weight:      SpO2: 95% 97% 97%      I/O last 3 completed shifts: In: 2746.5 [P.O.:410; I.V.:784.5; Other:1400; IV Piggyback:152] Out: 2980 [Urine:2920; Chest Tube:60]  Physical Examination: General:  Chronically ill in NAD Neuro:  Awake, calm, MAE HEENT:  jvd wnl Neck:  R IJ TLC c/d/i Cardiovascular:  s1 s2 rrt  Lungs:  resp's even/non-labored, lungs bilaterally diminished Abdomen:  Soft, BS wnl, malnurished  Ventilator settings: Vent Mode:  [-]  FiO2 (%):  [70 %-100 %] 100 %  Labs and Imaging:  Reviewed.  Please refer to the Assessment and Plan section for relevant results. CBC  Lab 06/14/11 0503 06/13/11 0440 06/12/11 0406  HGB 11.0* 11.8* 10.5*  HCT 32.7* 35.0* 31.2*  WBC 9.4 11.3* 10.9*  PLT 230 233  223     ABG    Component Value Date/Time   PHART 7.388 06/13/2011 0325   PCO2ART 46.2* 06/13/2011 0325   PO2ART 66.2* 06/13/2011 0325   HCO3 27.2* 06/13/2011 0325   TCO2 28.6 06/13/2011 0325   ACIDBASEDEF 2.0 06/12/2011 0403   O2SAT 92.9 06/13/2011 0325    BMET  Lab 06/14/11 0503 06/13/11 0440 06/12/11 0406 06/11/11 0554  NA 134* 139 137 140  K 4.0 3.6 -- --  CL 100 103 106 108  CO2 28 29 24 21   GLUCOSE 94 94 106* 94  BUN 9 5* 4* 9  CREATININE 0.66 0.66 0.62 0.70  CALCIUM 9.1 9.3 8.2* 9.1  MG -- -- -- --  PHOS -- -- -- --     Assessment and Plan: Acute resp failure: multifactorial:  ATX in setting restrictive lung dz, bilateral airspace, r/o edema, small face with mal placed mask.  2/9 O2 dropped off bipap to 80's with 100% NRB.  PLAN: -NIMV to prevent intubation and reduce progressive ATX -continue to cycle on BiPAP for now -lasix, small dose repeat 2/9 - no overt evidence infection -chest tubes per CVTS -pcxr in am  -her baseline RR is elevated from restriction as well - clear liquid diet  Tachycardia -likely at baseline HR with restriction -repeat small dose lasix 2/9 -Treat anxiety  S/p VATS, bleb  staple, spont PTX -Per cvts, chest tube no leak -pcxr in am   Pain control -fent pca per prim, effective  Anxiety a big issue - will need versed prn while on nimv    Best practices / Disposition: -->ICU status -->full code -->lovenox -->Protonix for GI Px -->diet to clears   Canary Brim, NP-C [facility] Pgr: [phone]  I have seen and examined this patient with the nurse practionner and agree with the above assessment and plan.  I have made notations to the exceptions.  Shan Levans Beeper  [phone]  Cell  [phone]  If no response or cell goes to voicemail, call beeper [phone]

## 2011-06-14 NOTE — Progress Notes (Signed)
Took pt. Off BIPAP around 04:58 & placed on 100% NRB. Pt.'s SAT's remained in the low to mid 80's. Pt. Was instructed to cough & deep breathe & SAT's still remained in the low 80's. Pt. Was placed back on BIPAP 10/5, BUR: 8, 100%. SAT's came back up to 92%. Pt. Is stable at this time. All vitals are WNL. RT will continue to monitor.

## 2011-06-15 ENCOUNTER — Inpatient Hospital Stay (HOSPITAL_COMMUNITY): Payer: Medicare Other

## 2011-06-15 DIAGNOSIS — R0902 Hypoxemia: Secondary | ICD-10-CM

## 2011-06-15 DIAGNOSIS — J930 Spontaneous tension pneumothorax: Secondary | ICD-10-CM

## 2011-06-15 DIAGNOSIS — J9600 Acute respiratory failure, unspecified whether with hypoxia or hypercapnia: Secondary | ICD-10-CM

## 2011-06-15 LAB — TYPE AND SCREEN
ABO/RH(D): O POS
Antibody Screen: NEGATIVE
Unit division: 0
Unit division: 0

## 2011-06-15 LAB — CBC
HCT: 33.5 % — ABNORMAL LOW (ref 36.0–46.0)
MCV: 92.3 fL (ref 78.0–100.0)
RBC: 3.63 MIL/uL — ABNORMAL LOW (ref 3.87–5.11)
WBC: 9.8 10*3/uL (ref 4.0–10.5)

## 2011-06-15 LAB — BASIC METABOLIC PANEL
CO2: 31 mEq/L (ref 19–32)
Chloride: 95 mEq/L — ABNORMAL LOW (ref 96–112)
Potassium: 3.7 mEq/L (ref 3.5–5.1)
Sodium: 134 mEq/L — ABNORMAL LOW (ref 135–145)

## 2011-06-15 MED ORDER — DIPHENHYDRAMINE HCL 50 MG/ML IJ SOLN: 12.5000 mg | Freq: Once | INTRAMUSCULAR | Status: DC

## 2011-06-15 MED ORDER — DIPHENHYDRAMINE HCL 25 MG PO CAPS
25.0000 mg | ORAL_CAPSULE | Freq: Four times a day (QID) | ORAL | Status: DC | PRN
  Administered 2011-06-16 – 2011-06-18 (×2): 25 mg via ORAL
  Filled 2011-06-15 (×3): qty 1

## 2011-06-15 MED ORDER — HYDROCOD POLST-CHLORPHEN POLST 10-8 MG/5ML PO LQCR
5.0000 mL | Freq: Two times a day (BID) | ORAL | Status: DC
  Administered 2011-06-15 – 2011-06-18 (×6): 5 mL via ORAL
  Filled 2011-06-15 (×6): qty 5

## 2011-06-15 MED ORDER — DIPHENHYDRAMINE HCL 50 MG/ML IJ SOLN
INTRAMUSCULAR | Status: AC
  Administered 2011-06-15: 12.5 mg via INTRAVENOUS
  Filled 2011-06-15: qty 1

## 2011-06-15 NOTE — Progress Notes (Signed)
S postop day 4 baths resection of bleb and pleurodesed is for pneumothorax Chest tube out chest x-ray satisfactory without recurrent pneumo or effusion Incision healing afebrile getting stronger Plan to transfer patient out of ICU tomorrow per cc M.

## 2011-06-15 NOTE — Progress Notes (Signed)
Name: Rhonda Ramos MRN: [id number] DOB: 1965-12-06    LOS: 5  PCCM FOLLOW UP CONSULT NOTE  History of Present Illness:   46 yr old WF h/o Spina Bifida (related scoliosis) and prior spont PTX November resolved with Chest tube. Presented 2/5 with chest pain rt.  Presented to primary care provider with chest pain and sob, pcxr - large rt PTX. Chest tube placed. VATS procedure performed with bleb staple 2/6.  Post op has had intermittent desaturation and now acute resp failure. No fever, no leak , no secretions issues. Multiple back surgery for spina bifida in past. Pt appears very anxious. Also suffers from chronic pain and narcotic addition from back.  Lines / Drains: Rt chest tube 2/6>>>2/9 R IJ TLC>>>2/10  Cultures: MRSA PCR pos  Antibiotics: Cefuroxime 2/6>>>2/7  Tests / Events: 2/6- VATS, bleb staple 2/8- acute hypoxic resp failure  SUBJECTIVE RN reports pt wore NRB most of day yesterday, NIMV overnight.  No acute events.   Vital Signs: Filed Vitals:   06/15/11 0416 06/15/11 0500 06/15/11 0600 06/15/11 0700  BP:  118/74 118/84 109/78  Pulse: 93 101 103 90  Temp:    96.9 F (36.1 C)  TempSrc:    Axillary  Resp: 13 19 17 12   Height:      Weight:   108 lb 11 oz (49.3 kg)   SpO2: 100% 99% 98% 100%     I/O last 3 completed shifts: In: 2072 [P.O.:1190; I.V.:732; IV Piggyback:150] Out: 3620 [Urine:3570; Chest Tube:50]  Physical Examination: General:  Chronically ill in NAD Neuro:  Awake, calm, MAE HEENT:  jvd wnl Neck:  R IJ TLC c/d/i Cardiovascular:  s1 s2 rrt  Lungs:  resp's even/non-labored, lungs bilaterally diminished Abdomen:  Soft, BS wnl, malnurished  Ventilator settings: Vent Mode:  [-]  FiO2 (%):  [100 %] 100 %  Labs and Imaging:  Reviewed.  Please refer to the Assessment and Plan section for relevant results. CBC  Lab 06/15/11 0255 06/14/11 0503 06/13/11 0440  HGB 11.4* 11.0* 11.8*  HCT 33.5* 32.7* 35.0*  WBC 9.8 9.4 11.3*  PLT 270 230 233      ABG    Component Value Date/Time   PHART 7.388 06/13/2011 0325   PCO2ART 46.2* 06/13/2011 0325   PO2ART 66.2* 06/13/2011 0325   HCO3 27.2* 06/13/2011 0325   TCO2 28.6 06/13/2011 0325   ACIDBASEDEF 2.0 06/12/2011 0403   O2SAT 92.9 06/13/2011 0325    BMET  Lab 06/15/11 0255 06/14/11 0503 06/13/11 0440 06/12/11 0406 06/11/11 0554  NA 134* 134* 139 137 140  K 3.7 4.0 -- -- --  CL 95* 100 103 106 108  CO2 31 28 29 24 21   GLUCOSE 92 94 94 106* 94  BUN 10 9 5* 4* 9  CREATININE 0.73 0.66 0.66 0.62 0.70  CALCIUM 9.1 9.1 9.3 8.2* 9.1  MG -- -- -- -- --  PHOS -- -- -- -- --     Assessment and Plan: Acute resp failure: multifactorial:  ATX in setting restrictive lung dz, bilateral airspace, r/o edema, small face with mal placed mask.  2/9 O2 dropped off bipap to 80's with 100% NRB.  PLAN: D/c foley and CVL D/c bipap Titrate oxygen BD Flutter valve  Tachycardia -likely at baseline HR with restriction --Treat anxiety  S/p VATS, bleb staple, spont PTX -Per CVTS -pcxr in am and today  Pain control -fent patch, po narc,  pca is off  Anxiety a big issue monitor  Best  practices / Disposition: -->ICU status -->full code -->lovenox -->Protonix for GI Px -->diet to regular   Canary Brim, NP-C [facility] Pgr: [phone]  I have seen and examined this patient with the nurse practionner and agree with the above assessment and plan.  I have made notations to the exceptions.  Shan Levans Beeper  [phone]  Cell  [phone]  If no response or cell goes to voicemail, call beeper [phone]

## 2011-06-16 ENCOUNTER — Inpatient Hospital Stay (HOSPITAL_COMMUNITY): Payer: Medicare Other

## 2011-06-16 DIAGNOSIS — J930 Spontaneous tension pneumothorax: Secondary | ICD-10-CM

## 2011-06-16 DIAGNOSIS — J9819 Other pulmonary collapse: Secondary | ICD-10-CM

## 2011-06-16 DIAGNOSIS — R0902 Hypoxemia: Secondary | ICD-10-CM

## 2011-06-16 DIAGNOSIS — J9600 Acute respiratory failure, unspecified whether with hypoxia or hypercapnia: Secondary | ICD-10-CM

## 2011-06-16 LAB — BASIC METABOLIC PANEL
Chloride: 99 mEq/L (ref 96–112)
GFR calc Af Amer: >90 mL/min (ref >90)
Potassium: 4.2 mEq/L (ref 3.5–5.1)
Sodium: 135 mEq/L (ref 135–145)

## 2011-06-16 LAB — CBC
HCT: 34.8 % — ABNORMAL LOW (ref 36.0–46.0)
Hemoglobin: 11.5 g/dL — ABNORMAL LOW (ref 12.0–15.0)
RBC: 3.73 MIL/uL — ABNORMAL LOW (ref 3.87–5.11)
RDW: 13.8 % (ref 11.5–15.5)
WBC: 8 10*3/uL (ref 4.0–10.5)

## 2011-06-16 MED ORDER — MOXIFLOXACIN HCL 400 MG PO TABS
400.0000 mg | ORAL_TABLET | Freq: Every day | ORAL | Status: DC
  Administered 2011-06-16 – 2011-06-17 (×2): 400 mg via ORAL
  Filled 2011-06-16 (×3): qty 1

## 2011-06-16 MED ORDER — NORETHINDRONE 0.35 MG PO TABS
1.0000 | ORAL_TABLET | Freq: Every day | ORAL | Status: DC
  Administered 2011-06-16 – 2011-06-17 (×3): 0.35 mg via ORAL
  Filled 2011-06-16: qty 1

## 2011-06-16 MED ORDER — ALBUTEROL SULFATE (5 MG/ML) 0.5% IN NEBU
2.5000 mg | INHALATION_SOLUTION | Freq: Four times a day (QID) | RESPIRATORY_TRACT | Status: DC
  Administered 2011-06-16 – 2011-06-17 (×4): 2.5 mg via RESPIRATORY_TRACT
  Filled 2011-06-16 (×4): qty 0.5

## 2011-06-16 MED ORDER — HYDROXYZINE HCL 25 MG PO TABS
25.0000 mg | ORAL_TABLET | Freq: Three times a day (TID) | ORAL | Status: DC | PRN
  Administered 2011-06-16: 25 mg via ORAL
  Filled 2011-06-16: qty 1

## 2011-06-16 NOTE — Progress Notes (Signed)
Name: Rhonda Ramos MRN: [id number] DOB: 11-24-1965    LOS: 6  PCCM FOLLOW UP CONSULT NOTE  PT PROFILE:   46 yr old WF h/o Spina Bifida (related scoliosis) and prior spont PTX (11/12). Presented with recurrence 06/09/10. VATS procedure performed with bleb resection 2/06.     Lines / Drains: Rt chest tube 2/6>>>2/9 R IJ TLC>>>2/10  Cultures: MRSA PCR pos  Antibiotics: Cefuroxime 2/6>>>2/7  Tests / Events: 2/6- VATS, bleb staple 2/8- acute hypoxic resp failure  SUBJECTIVE Comfortable on [place] O2. Vigorous cough and mild to moderate purulent sputum  Vital Signs: Filed Vitals:   06/16/11 0400 06/16/11 0515 06/16/11 0733 06/16/11 1134  BP: 114/68 137/86    Pulse: 96 95    Temp:  99.5 F (37.5 C) 98.5 F (36.9 C) 98.8 F (37.1 C)  TempSrc:  Oral Oral Oral  Resp: 13 17    Height:      Weight: 49.6 kg (109 lb 5.6 oz)     SpO2: 91% 94%       I/O last 3 completed shifts: In: 2231 [P.O.:1740; I.V.:341; IV Piggyback:150] Out: 2345 [Urine:2345]  Physical Examination: General:  Chronically ill in NAD Neuro:  Awake, calm, MAE HEENT:  WNL Cardiovascular:  s1 s2 rrt  Lungs:  Clear anteriorly. Diffuse scattered rhonchi posteriorly Abdomen:  Soft, BS wnl, malnurished Ext: no edema  Labs and Imaging:  Reviewed.  Please refer to the Assessment and Plan section for relevant results. CBC  Lab 06/16/11 0645 06/15/11 0255 06/14/11 0503  HGB 11.5* 11.4* 11.0*  HCT 34.8* 33.5* 32.7*  WBC 8.0 9.8 9.4  PLT 313 270 230    BMET  Lab 06/16/11 0645 06/15/11 0255 06/14/11 0503 06/13/11 0440 06/12/11 0406  NA 135 134* 134* 139 137  K 4.2 3.7 -- -- --  CL 99 95* 100 103 106  CO2 24 31 28 29 24   GLUCOSE 116* 92 94 94 106*  BUN 9 10 9  5* 4*  CREATININE 0.67 0.73 0.66 0.66 0.62  CALCIUM 9.8 9.1 9.1 9.3 8.2*  MG -- -- -- -- --  PHOS -- -- -- -- --   CXR: R>L basilar ATX  Assessment and Plan: Acute resp failure - multifactorial:  ATX in setting restrictive lung dz. Much improved  overall but now with likely purulent bronchitis -Add scheduled albuterol -Oral moxifloxacin X 5 days   S/p VATS, bleb staple for spont PTX -Per TCTS  Pain - improved. Cont same   Billy Fischer, MD;  PCCM service; [place] [phone]

## 2011-06-16 NOTE — Progress Notes (Signed)
eLink Physician-Brief Progress Note Patient Name: Rhonda Ramos DOB: 06-19-1965 MRN: [id number]  Date of Service  06/16/2011   HPI/Events of Note  cvts needs bed for Acute They have suggested moving this pt  eICU Interventions  Reviewed, off nIMV,. No distress. Move to sdu   Intervention Category Minor Interventions: Clinical assessment - ordering diagnostic tests;Communication with other healthcare providers and/or family  Nelda Bucks. 06/16/2011, 2:37 AM

## 2011-06-16 NOTE — Progress Notes (Addendum)
[address]            [place] [address]          [phone]     5 Days Post-Op Procedure(s) (LRB): VIDEO ASSISTED THORACOSCOPY (Right) RESECTION OF APICAL BLEB (Right)  Subjective: Pain better controlled with Fentanyl patch and Oxycodone.  +Cough, mostly nonproductive.  C/o pain with lifting Rt arm/shoulder. Also, c/o itching related to pain Rx.  Objective: Vital signs in last 24 hours: Patient Vitals for the past 24 hrs:  BP Temp Temp src Pulse Resp SpO2 Weight  06/16/11 0515 137/86 mmHg 99.5 F (37.5 C) Oral 95  17  94 % -  06/16/11 0400 114/68 mmHg - - 96  13  91 % 49.6 kg (109 lb 5.6 oz)  06/16/11 0300 115/75 mmHg - - 96  18  89 % -  06/16/11 0200 112/71 mmHg - - 94  11  94 % -  06/16/11 0100 113/65 mmHg - - 96  12  92 % -  06/16/11 0000 121/66 mmHg - - 100  17  92 % -  06/15/11 2315 - 98.4 F (36.9 C) Axillary 100  14  93 % -  06/15/11 2300 113/69 mmHg - - 106  18  90 % -  06/15/11 2200 106/78 mmHg - - 100  17  94 % -  06/15/11 2100 122/75 mmHg - - 105  14  92 % -  06/15/11 2000 126/98 mmHg 98.1 F (36.7 C) Oral 104  19  96 % -  06/15/11 1900 125/94 mmHg - - 114  23  90 % -  06/15/11 1800 111/69 mmHg - - 100  18  95 % -  06/15/11 1700 114/73 mmHg - - 106  20  93 % -  06/15/11 1600 106/68 mmHg - - 104  17  94 % -  06/15/11 1553 - 99 F (37.2 C) Oral - - - -  06/15/11 1500 115/71 mmHg - - 110  20  92 % -  06/15/11 1400 114/67 mmHg - - 112  22  95 % -  06/15/11 1300 111/80 mmHg - - 101  18  95 % -  06/15/11 1207 - 98.4 F (36.9 C) Oral - - - -  06/15/11 1200 96/69 mmHg - - 92  19  91 % -  06/15/11 1100 109/64 mmHg - - 100  20  92 % -  06/15/11 1002 106/76 mmHg - - 102  25  94 % -  06/15/11 1000 75/38 mmHg - - 102  22  97 % -  06/15/11 0900 98/62 mmHg - - 94  15  96 % -   Current Weight  06/16/11 49.6 kg (109 lb 5.6 oz)     Intake/Output from previous day: 02/10 0701 - 02/11 0700 In: 1440 [P.O.:1320; I.V.:120] Out: 735  [Urine:735]    PHYSICAL EXAM:  Heart: tachy, RRR, HR 110-120s Lungs: decreased BS in bases, no wheezes, rales or rhonchi Wound: clean and dry Extremities: tender to palpation over Rt scapula, mobility of Rt shoulder limited secondary to pain  Lab Results: CBC: Basename 06/16/11 0645 06/15/11 0255  WBC 8.0 9.8  HGB 11.5* 11.4*  HCT 34.8* 33.5*  PLT 313 270   BMET:  Basename 06/15/11 0255 06/14/11 0503  NA 134* 134*  K 3.7 4.0  CL 95* 100  CO2 31 28  GLUCOSE 92 94  BUN 10 9  CREATININE 0.73 0.66  CALCIUM 9.1 9.1    PT/INR: No results found for this basename: LABPROT,INR in the last 72 hours CXR: stable elevation of R hemidiaphragm, no obvious ptx  Assessment/Plan: S/P Procedure(s) (LRB): VIDEO ASSISTED THORACOSCOPY (Right) RESECTION OF APICAL BLEB (Right) Pulm- stable on 4L.  Continue aggressive pulm toilet/IS, wean O2 as tolerated. CV- tachycardic.  Will watch, may need to add low-dose beta blocker. GU- d/c Foley.  Pt I/O caths self at home.  Will I/O cath prn. Continue PT/rehab. Will ask them to help with Rt arm mobility. Atarax prn itching.   LOS: 6 days    COLLINS,GINA H 06/16/2011  home to get home soon I have seen and examined Rhonda Ramos and agree with the above assessment  and plan.  Delight Ovens MD Beeper [phone] Office [phone] 06/16/2011 3:03 PM

## 2011-06-16 NOTE — Progress Notes (Signed)
Physical Therapy Treatment Patient Details Name: Rhonda Ramos MRN: [id number] DOB: [birth year] Today's Date: 06/16/2011  PT Assessment/Plan  PT - Assessment/Plan Comments on Treatment Session: Patient with right scapula pain mostly with active shoulder flexion and external rotation - she does not have a high pain tolerance as grimaces when moves arm at times secondary to IV site. She also is able to don and doff AFO's without complaints.. I would recommend an OT consult to ensure she is independent with ADL's and to address shoulder pain and ROM issues.  PT Plan: Discharge plan remains appropriate;Frequency remains appropriate Recommendations for Other Services: OT consult Follow Up Recommendations: Home health PT;Supervision/Assistance - 24 hour Equipment Recommended: Defer to next venue PT Goals  Acute Rehab PT Goals PT Goal: Supine/Side to Sit - Progress: Progressing toward goal PT Goal: Sit to Supine/Side - Progress: Progressing toward goal PT Goal: Sit to Stand - Progress: Progressing toward goal PT Goal: Stand to Sit - Progress: Progressing toward goal PT Goal: Ambulate - Progress: Progressing toward goal  PT Treatment Precautions/Restrictions  Precautions Precautions: Fall Precaution Comments: pt with baseline spina bifida, ambulates with RW and bilateral leg braces Required Braces or Orthoses: Yes Other Brace/Splint: Bilateral AFO's Restrictions Weight Bearing Restrictions: No Mobility (including Balance) Bed Mobility Supine to Sit: 4: Min assist Supine to Sit Details (indicate cue type and reason): Assistance for trunk manuevering legs over edge of bed Sitting - Scoot to Edge of Bed: 3: Mod assist Sitting - Scoot to Edge of Bed Details (indicate cue type and reason): use of pad as patient with difficulty secondary to feet unsupported and right scapula pain limiting weight bearing. Sit to Supine: 3: Mod assist Sit to Supine - Details (indicate cue type and reason):  assistance to raise legs on to bed. Ambulation/Gait Ambulation/Gait: Yes Ambulation/Gait Assistance: 3: Mod assist Ambulation/Gait Assistance Details (indicate cue type and reason): + 2 for lines. Heavy UE reliance on PT. Needs new velcro on AFO as came apart while walking. Verbal cues for upright posture -premobid limitations secondary to spina bifida and scoliosis. Ambulation Distance (Feet):  (1 x 5 feet, 1 x 25 feet) Assistive device: Straight cane Gait Pattern: Decreased stride length;Step-to pattern;Lateral trunk lean to left;Lateral trunk lean to right;Left foot flat;Right foot flat;Scissoring;Trunk flexed  Static Sitting Balance Static Sitting - Balance Support: No upper extremity supported;Feet supported Static Sitting - Level of Assistance: 4: Min assist Static Sitting - Comment/# of Minutes: Intermittent assistance required.    End of Session PT - End of Session Equipment Utilized During Treatment: Gait belt;Left ankle foot orthosis;Right ankle foot orthosis Activity Tolerance: Patient limited by fatigue;Patient limited by pain Patient left: in bed;with call bell in reach Nurse Communication: Mobility status for ambulation General Behavior During Session: [facility] for tasks performed Cognition: [facility] for tasks performed  Edwyna Perfect, PT  Pager [phone]  06/16/2011, 12:22 PM

## 2011-06-17 ENCOUNTER — Inpatient Hospital Stay (HOSPITAL_COMMUNITY): Payer: Medicare Other

## 2011-06-17 MED ORDER — GUAIFENESIN ER 600 MG PO TB12: 600.0000 mg | ORAL_TABLET | Freq: Two times a day (BID) | ORAL | Status: DC

## 2011-06-17 MED ORDER — OXYCODONE-ACETAMINOPHEN 5-325 MG PO TABS: 1.0000 | ORAL_TABLET | Freq: Four times a day (QID) | ORAL | Status: DC | PRN

## 2011-06-17 MED ORDER — MOXIFLOXACIN HCL 400 MG PO TABS: 400.0000 mg | ORAL_TABLET | Freq: Every day | ORAL | Status: AC

## 2011-06-17 MED ORDER — ALBUTEROL SULFATE HFA 108 (90 BASE) MCG/ACT IN AERS
2.0000 | INHALATION_SPRAY | RESPIRATORY_TRACT | Status: DC | PRN
  Filled 2011-06-17: qty 6.7

## 2011-06-17 MED ORDER — OXYCODONE-ACETAMINOPHEN 5-325 MG PO TABS: 1.0000 | ORAL_TABLET | Freq: Four times a day (QID) | ORAL | Status: AC | PRN

## 2011-06-17 MED ORDER — TRAMADOL HCL 50 MG PO TABS: 50.0000 mg | ORAL_TABLET | Freq: Four times a day (QID) | ORAL | Status: DC | PRN

## 2011-06-17 MED ORDER — FENTANYL 50 MCG/HR TD PT72: 1.0000 | MEDICATED_PATCH | TRANSDERMAL | Status: DC

## 2011-06-17 MED ORDER — MOXIFLOXACIN HCL 400 MG PO TABS: 400.0000 mg | ORAL_TABLET | Freq: Every day | ORAL | Status: DC

## 2011-06-17 MED ORDER — HYDROCOD POLST-CHLORPHEN POLST 10-8 MG/5ML PO LQCR: 5.0000 mL | Freq: Two times a day (BID) | ORAL | Status: DC | PRN

## 2011-06-17 NOTE — Progress Notes (Signed)
Name: Rhonda Ramos MRN: [id number] DOB: 12-01-1965    LOS: 7  PCCM FOLLOW UP CONSULT NOTE  PT PROFILE:   46 yr old WF h/o Spina Bifida (related scoliosis) and prior spont PTX (11/12). Presented with recurrence 06/09/10. VATS procedure performed with bleb resection 2/06.     Lines / Drains: Rt chest tube 2/6>>>2/9 R IJ TLC>>>2/10  Cultures: MRSA PCR pos  Antibiotics: Cefuroxime 2/6>>>2/7 Avelox 2/11 >> 2/16 (planned)  Tests / Events: 2/6- VATS, bleb staple 2/8- acute hypoxic resp failure  SUBJECTIVE No overt dyspnea. Cough improved. No new complaints  Vital Signs: Filed Vitals:   06/17/11 0415 06/17/11 0755 06/17/11 0811 06/17/11 1223  BP: 156/90 141/85    Pulse: 91     Temp: 98.3 F (36.8 C) 98.8 F (37.1 C)  98.8 F (37.1 C)  TempSrc: Oral Oral  Oral  Resp: 12     Height:      Weight:      SpO2: 89%  94%      I/O last 3 completed shifts: In: 2260 [P.O.:2160; IV Piggyback:100] Out: 1035 [Urine:1035]  Physical Examination: General:  Chronically ill in NAD Neuro:  Awake, calm, MAE HEENT:  WNL Cardiovascular:  s1 s2 rrt  Lungs:  Clear anteriorly. Diffuse scattered rhonchi posteriorly - improved Abdomen:  Soft, BS wnl, malnurished Ext: no edema  Labs and Imaging:  Reviewed.  Please refer to the Assessment and Plan section for relevant results. CBC  Lab 06/16/11 0645 06/15/11 0255 06/14/11 0503  HGB 11.5* 11.4* 11.0*  HCT 34.8* 33.5* 32.7*  WBC 8.0 9.8 9.4  PLT 313 270 230    BMET  Lab 06/16/11 0645 06/15/11 0255 06/14/11 0503 06/13/11 0440 06/12/11 0406  NA 135 134* 134* 139 137  K 4.2 3.7 -- -- --  CL 99 95* 100 103 106  CO2 24 31 28 29 24   GLUCOSE 116* 92 94 94 106*  BUN 9 10 9  5* 4*  CREATININE 0.67 0.73 0.66 0.66 0.62  CALCIUM 9.8 9.1 9.1 9.3 8.2*  MG -- -- -- -- --  PHOS -- -- -- -- --   CXR: R>L basilar ATX  Assessment and Plan: Acute resp failure - multifactorial:  ATX in setting restrictive lung dz. Much improved overall but now  with likely purulent bronchitis - improved with BDs and abx -Cont scheduled albuterol while hospitalized. PRN albuterol after discharge -Cont oral moxifloxacin X 5 days   S/p VATS, bleb staple for spont PTX -Per TCTS  Pain - improved. Cont same  PCCM will sign off. Please call if we can be of further assistance   Billy Fischer, MD;  PCCM service; Mobile [phone]

## 2011-06-17 NOTE — Progress Notes (Signed)
Physical Therapy Treatment Patient Details Name: Rhonda Ramos MRN: [id number] DOB: [birth year] Today's Date: 06/17/2011  PT Assessment/Plan  PT - Assessment/Plan Comments on Treatment Session: Patient with improved right scapula area pain today and now with full ROM actively of shoulder. She is progressing well with mobilty but isunsafe due to impulsivity. PT Plan: Discharge plan remains appropriate;Frequency remains appropriate Follow Up Recommendations: Supervision/Assistance - 24 hour;Home health PT PT Goals  Acute Rehab PT Goals PT Goal: Supine/Side to Sit - Progress: Progressing toward goal PT Goal: Sit to Stand - Progress: Progressing toward goal PT Goal: Stand to Sit - Progress: Progressing toward goal PT Goal: Ambulate - Progress: Progressing toward goal  PT Treatment Precautions/Restrictions  Precautions Precautions: Fall Precaution Comments: pt with baseline spina bifida, ambulates with RW and bilateral leg braces Required Braces or Orthoses: Yes Other Brace/Splint: Declined wearing AFO"s today Restrictions Weight Bearing Restrictions: No Mobility (including Balance) Bed Mobility Supine to Sit: 5: Supervision Supine to Sit Details (indicate cue type and reason): Verbal cues for safety as impulsive Sitting - Scoot to Edge of Bed: 5: Supervision Sitting - Scoot to Edge of Bed Details (indicate cue type and reason): Verbal cues for safety as impulsive Transfers Sit to Stand: 5: Supervision;With upper extremity assist;From bed Sit to Stand Details (indicate cue type and reason): Verbal cues as impulsive and tries to move before lines and leads set up. Stand to Sit: To chair/3-in-1;5: Supervision;With upper extremity assist Stand to Sit Details: Education in safety as released walker and attempted to ambulate using the side of the bed for support.  Ambulation/Gait Ambulation/Gait Assistance: 4: Min assist Ambulation/Gait Assistance Details (indicate cue type and reason):  Min-guard assistance. Verbal cues for safety as speed unsafe. Two standing rest breaks required to decrease heart rate secondary up to 140 briefly. Once rested patient with decreased HR to 120's and 117 when ambulation complete. Deficits in gait secondary to chronic lower extremity deformities. Ambulation Distance (Feet): 160 Feet Assistive device: Rolling walker Gait Pattern: Step-through pattern;Trunk flexed  Static Sitting Balance Static Sitting - Balance Support: Feet unsupported;No upper extremity supported Static Sitting - Level of Assistance: 7: Independent End of Session PT - End of Session Equipment Utilized During Treatment: Gait belt Activity Tolerance: Patient tolerated treatment well Patient left: in chair;with call bell in reach Nurse Communication: Mobility status for ambulation General Behavior During Session:  (Impulsive) Cognition: [facility] for tasks performed  Edwyna Perfect, PT  Pager [phone] 06/17/2011, 12:31 PM

## 2011-06-17 NOTE — Discharge Instructions (Signed)
ACTIVITY:  1.Increase activity slowly. 2.Walk daily and increase frequency and duration as tolerates. 3.May walk up steps. 4.No lifting more than ten pounds for two weeks. 5.No driving for two weeks. 6.Avoid straining. 7.STOP any activity that causes chest pain, shortness of breath, dizziness,sweating, or excessive weakness. 8.Continue with breathing exercises daily.  DIET: Heart healthy as tolerates.  WOUND:  1.May shower. 2.Clean wounds with mild soap and water.  Call the office at [phone] if any   problems arise.  Thoracoscopy Care After Refer to this sheet in the next few weeks. These discharge instructions provide you with general information on caring for yourself after you leave the hospital. Your caregiver may also give you specific instructions. Your treatment has been planned according to the most current medical practices available, but unavoidable complications sometimes occur. If you have any problems or questions after discharge, call your caregiver. HOME CARE INSTRUCTIONS   Remove the bandage (dressing) over your chest tube site as directed by your caregiver.   It is normal to be sore for a couple weeks following surgery. See your caregiver if this seems to be getting worse rather than better.   Only take over-the-counter or prescription medicines for pain, discomfort, or fever as directed by your caregiver. It is very important to take pain medicine when you need it so that you will cough and breathe deeply enough to clear mucus (phlegm) and expand your lungs.   If it hurts to cough, hold a pillow against your chest when you cough. This may help with the discomfort. In spite of the discomfort, cough frequently, as this helps protect against getting an infection in your lung (pneumonia).   Taking deep breaths keeps lungs inflated and protects against pneumonia. Most patients will go home with an incentive spirometer that encourages deep breathing.   You may resume  a normal diet and activities as directed.   Use showers for bathing until you see your caregiver, or as instructed.   Change dressings if necessary or as directed.   Avoid lifting or driving until you are instructed otherwise.   Make an appointment to see your caregiver for stitch (suture) or staple removal when instructed.   Do not travel by airplane for 2 weeks after the chest tube is removed.  SEEK MEDICAL CARE IF:   You are bleeding from your wounds.   You have redness, swelling, or increasing pain in the wounds.   Your heartbeat feels irregular or very fast.   There is pus coming from your wounds.   There is a bad smell coming from the wound or dressing.  SEEK IMMEDIATE MEDICAL CARE IF:   You have a fever.   You develop a rash.   You have difficulty breathing.   You develop any reaction or side effects to medicines given.   You develop lightheadedness or feel faint.   You develop shortness of breath or chest pain.  MAKE SURE YOU:   Understand these instructions.   Will watch your condition.   Will get help right away if you are not doing well or get worse.  Document Released: 11/08/2004 Document Revised: 01/01/2011 Document Reviewed: 10/09/2010 [facility] Patient Information 2012 [place], [place].

## 2011-06-17 NOTE — Progress Notes (Signed)
Second assessment repeated by mistake

## 2011-06-17 NOTE — Progress Notes (Addendum)
                    [address]            [place] [address]          [phone]     6 Days Post-Op Procedure(s) (LRB): VIDEO ASSISTED THORACOSCOPY (Right) RESECTION OF APICAL BLEB (Right)  Subjective: Breathing better today.  Still with productive cough.  R arm mobility improved.  Pain controlled.  Objective: Vital signs in last 24 hours: Patient Vitals for the past 24 hrs:  BP Temp Temp src Pulse Resp SpO2  06/17/11 0811 - - - - - 94 %  06/17/11 0755 141/85 mmHg 98.8 F (37.1 C) Oral - - -  06/17/11 0415 156/90 mmHg 98.3 F (36.8 C) Oral 91  12  89 %  06/17/11 0015 111/70 mmHg 98.4 F (36.9 C) Oral 110  22  95 %  06/16/11 1900 123/62 mmHg 99.3 F (37.4 C) Oral - 16  95 %  06/16/11 1554 120/81 mmHg 98.7 F (37.1 C) Oral - - -  06/16/11 1134 - 98.8 F (37.1 C) Oral - - -   Current Weight  06/16/11 49.6 kg (109 lb 5.6 oz)     Intake/Output from previous day: 02/11 0701 - 02/12 0700 In: 1900 [P.O.:1800; IV Piggyback:100] Out: 700 [Urine:700]    PHYSICAL EXAM:  Heart: RRR, tachy 120 Lungs: few coarse BS bilat Wound: clean and dry   Lab Results: CBC: Basename 06/16/11 0645 06/15/11 0255  WBC 8.0 9.8  HGB 11.5* 11.4*  HCT 34.8* 33.5*  PLT 313 270   BMET:  Basename 06/16/11 0645 06/15/11 0255  NA 135 134*  K 4.2 3.7  CL 99 95*  CO2 24 31  GLUCOSE 116* 92  BUN 9 10  CREATININE 0.67 0.73  CALCIUM 9.8 9.1    PT/INR: No results found for this basename: LABPROT,INR in the last 72 hours  CXR: stable L basilar atx/infiltrate, worsening R basilar atx/infiltrate  Assessment/Plan: S/P Procedure(s) (LRB): VIDEO ASSISTED THORACOSCOPY (Right) RESECTION OF APICAL BLEB (Right) Pulm- still on 4L O2. May need to arrange home O2.  Continue Avelox D#2 for presumed bronchitis. CV- tachycardia stable. Hopefully home ?1-2 days if remains stable.   LOS: 7 days    COLLINS,GINA H 06/17/2011   poss home tomorrow Feels much better  I have  seen and examined [name] and agree with the above assessment  and plan.  Delight Ovens MD Beeper [phone] Office [phone] 06/17/2011 5:27 PM

## 2011-06-17 NOTE — Discharge Summary (Addendum)
Physician Discharge Summary  Patient ID: Rhonda Ramos MRN: [id number] DOB/AGE: 1965/11/13 46 y.o.  Admit date: 06/10/2011 Discharge date: 06/18/2011  Admission Diagnoses: 1.Recurrent, right spontaneous pneumothorax 2.History of chronic pain syndrome (s/p rehab for narcotic Detox 2011) 3.History of spina bifida 4.History of hypertension 5.History of scoliosis 6.History of GERD 7.History of seizures 8.History of CKD 9.History of heart murmur  Discharge Diagnoses:  1.Recurrent, right spontaneous pneumothorax 2.History of chronic pain syndrome (s/p rehab for narcotic Detox 2011) 3.History of spina bifida 4.History of hypertension 5.History of scoliosis 6.History of GERD 7.History of seizures 8.History of CKD 9.History of heart murmur 10.Probable bronchitis   Procedure (s): RIGHT VIDEO ASSISTED THORACOSCOPY,Stapeling of apical and inferior blebs, and mechanical pleurodesis by Dr. Tyrone Sage on 06/11/2011.   History of Presenting Illness: This is a 46 year old Caucasian female who began to experience right-sided chest pain and shortness of breath 06/10/2011. The episode came on acutely with the primary symptom related to pain. This was mostly in the anterior chest and radiated into the shoulder. She additionally has a dry cough. She denies fever or or other constitutional symptoms. She presented to her primary physician today and a chest x-ray was obtained. She was advised to present to the emergency room after results of the chest x-ray revealed that there was a large right-sided pneumothorax. There is approximately 90% volume loss. A Chest tube was placed. A Cardiothoracic surgical consultation was then obtained. It should be noted that a similar epidsode occurred on the right and a chest tube was placed by Dr Laneta Simmers in Nov 2012. She is now to be admitted for further observation and management.      A CT scan of the chest was done 06/11/2011. This showed no mediastinal or hilar adenopathy,  small amount of subcutaneous emphysema on the right, bullous emphysema, bilateral blebs and bibasilar atelectasis, and a small amount of pneumomediastinum.A long discussion was had with the patient regarding the necessitation for a right VATS, stapling of blebs, and pleurodesis. Potential risks, complications, and benefits of the surgery were discussed with the patient and she agreed to proceed.        Brief Hospital Course:  Patient was extubated later the evening of surgery without difficulty.She remained afebrile and hemodynamically stable. Her chest tube did not have an air leak. Daily chest xrays were obtained. Her foley was removed on postoperative day one.She then had hypoxia with O2 saturation into the mid 80's. She was placed on a 100% NRB and given lasix. She was then placed on a NRB with improvement. A critical care consult was obtained with Dr. Tyson Alias. The patient had al lot of anxiety (has a history) and was given medicine with to help control this.Chest tube was removed on 06/14/2011.She was transferred from ICU to 3300 for further convalescence on 06/16/2011.Chest xray remained stable.She was then thought to have bronchitis and was placed on Avelox.She has been tolerating a diet and has had a bowel movement. She has also been tachycardic. This will be monitored to see whether or not she will need a beta blocker. Provided she remains afebrile, chest xray remains stable, and pending morning round evaluation, she will be surgically stable for discharge on 06/18/2011.   Filed Vitals:   06/17/11 1223  BP:   Pulse:   Temp: 98.8 F (37.1 C)  Resp:      Latest Vital Signs: Blood pressure 141/85, pulse 91, temperature 98.8 F (37.1 C), temperature source Oral, resp. rate 12, height 4\' 7"  (1.397 m),  weight 109 lb 5.6 oz (49.6 kg), SpO2 94.00%.  Physical Exam: Heart: RRR, tachy 120  Lungs: few coarse BS bilat  Wound: clean and dry   Discharge Condition:Stable  Recent laboratory  studies:  Lab Results  Component Value Date   WBC 8.0 06/16/2011   HGB 11.5* 06/16/2011   HCT 34.8* 06/16/2011   MCV 93.3 06/16/2011   PLT 313 06/16/2011   Lab Results  Component Value Date   NA 135 06/16/2011   K 4.2 06/16/2011   CL 99 06/16/2011   CO2 24 06/16/2011   CREATININE 0.67 06/16/2011   GLUCOSE 116* 06/16/2011      Diagnostic Studies: Dg Chest 2 View  06/17/2011  *RADIOLOGY REPORT*  Clinical Data: Status post right VATS  CHEST - 2 VIEW  Comparison: 06/16/2011  Findings: Cardiomediastinal silhouette is stable.  Significant levoscoliosis thoracolumbar spine again noted.  Stable left basilar atelectasis or infiltrate.  Worsening right base atelectasis or infiltrate.  No convincing pulmonary edema.  Air is noted within the lower esophagus.  No diagnostic pneumothorax.  IMPRESSION: Stable left basilar atelectasis or infiltrate.  Worsening right basilar atelectasis or infiltrate.  No convincing pulmonary edema. No diagnostic pneumothorax.  Original Report Authenticated By: Natasha Mead, M.D.     Discharge Orders    Future Appointments: Provider: Department: Dept Phone: Center:   07/03/2011 4:00 PM Delight Ovens, MD [facility] [phone] [facility]      Discharge Medications: Medication List  As of 06/17/2011  2:25 PM   TAKE these medications         amLODipine 5 MG tablet   Commonly known as: NORVASC   take 1 tablet by mouth once daily      Oxycodone-acetominophen 5/325 mg tablet   Commonly known as: PERCOCET   Take one or two by mouth every six hours PRN breakthrough pain.      clonazePAM 0.5 MG tablet   Commonly known as: KLONOPIN   Take 1 tablet (0.5 mg total) by mouth 2 (two) times daily as needed for anxiety.      FLUoxetine 20 MG tablet   Commonly known as: PROZAC   Take 3 tablets (60 mg total) by mouth daily. Take 3 by mouth once daily      levETIRAcetam 250 MG tablet   Commonly known as: KEPPRA   Take 250 mg by mouth daily.      moxifloxacin 400 MG tablet    Commonly known as: AVELOX   Take 1 tablet (400 mg total) by mouth daily at 6 PM.      multivitamins ther. w/minerals Tabs   Take 1 tablet by mouth daily.      norethindrone 0.35 MG tablet   Commonly known as: MICRONOR,CAMILA,ERRIN   Take 1 tablet (0.35 mg total) by mouth daily.      omeprazole 20 MG capsule   Commonly known as: PRILOSEC   take 1 capsule by mouth once daily      oxybutynin 10 MG 24 hr tablet   Commonly known as: DITROPAN-XL   take 1 tablet by mouth once daily      Fentanyl patch 50 mcg   Commonly known as: DURAGESIC PATCH   Apply one patch (50 mcg total) every 72 hours for pain.            Follow Up Appointments: Follow-up Information    Follow up with GERHARDT,EDWARD B, MD. (PA/LAT CXR to be taken on 07/03/2011  at 3:00 pm;Appointment with Dr. Tyrone Sage is on 07/03/2011  at  4:00 pm)    Contact information:   [address] [place] [address] [phone]          Signed: Doree Fudge MPA-C 06/17/2011, 2:25 PM

## 2011-06-18 MED ORDER — HYDROCOD POLST-CHLORPHEN POLST 10-8 MG/5ML PO LQCR: 5.0000 mL | Freq: Two times a day (BID) | ORAL | Status: DC | PRN

## 2011-06-18 NOTE — Progress Notes (Signed)
While awake during the night patient's O2 sats stayed in the 90's on no oxygen. When patient asleep sats fall into the 80's and patient has to be reminded to keep O2 on.

## 2011-06-18 NOTE — Progress Notes (Signed)
Resting O2 sats 84%. BCates, Charity fundraiser, BSN.

## 2011-06-18 NOTE — Progress Notes (Signed)
   CARE MANAGEMENT NOTE 06/18/2011  Patient:  Rhonda Ramos, Rhonda Ramos   Account Number:  [id number]  Date Initiated:  06/12/2011  Documentation initiated by:  [facility]  Subjective/Objective Assessment:   spontaneous pneumo - requiring CT.  second one - 06-11-11 - post op VATS. Lives alone - has family.     Action/Plan:   PTA, PT LIVES ALONE, BUT IS DISCHARGING TO MOTHER'S HOME.   Anticipated DC Date:  06/16/2011   Anticipated DC Plan:  HOME/SELF CARE      DC Planning Services  CM consult      Choice offered to / List presented to:     DME arranged  [facility]      DME agency  [facility]        Status of service:  Completed, signed off Medicare Important Message given?   (If response is "NO", the following Medicare IM given date fields will be blank) Date Medicare IM given:   Date Additional Medicare IM given:    Discharge Disposition:  HOME W HOME HEALTH SERVICES  Per UR Regulation:  Reviewed for med. necessity/level of care/duration of stay  Comments:  06/18/11 Daegen Berrocal,RN,BSN 1100 MET WITH PT TO DISCUSS O2 SET UP.  ROOM AIR SAT 84% AT REST.  PT DID NOT KNOW SHE WAS DISCHARGING HOME ON O2, AND IS SOMEWHAT ANXIOUS ABOUT THIS.  REFERRAL TO [facility] FOR HOME O2 ; DERRIAN WITH [facility] TO PROVIDE PORTABLE TANK FOR HOME PRIOR TO DC. PT DISCHARGING TO MOM'S HOME; [facility] GIVEN MOTHER'S ADDRESS FOR DELIVERY. Jerrell Belfast, RN, BSN Phone #[phone]

## 2011-06-18 NOTE — Progress Notes (Signed)
                    [address]            [place] [address]          [phone]     7 Days Post-Op Procedure(s) (LRB): VIDEO ASSISTED THORACOSCOPY (Right) RESECTION OF APICAL BLEB (Right)  Subjective: Feeling better, less coughing.  Ready to go home.  Objective: Vital signs in last 24 hours: Patient Vitals for the past 24 hrs:  BP Temp Temp src Pulse Resp SpO2  06/18/11 0822 138/73 mmHg 98.9 F (37.2 C) Oral - - -  06/18/11 0400 126/83 mmHg 99.4 F (37.4 C) Oral 95  16  91 %  06/18/11 0000 111/61 mmHg 99.1 F (37.3 C) Oral 104  17  99 %  06/17/11 2000 121/64 mmHg 98.9 F (37.2 C) Oral 101  14  91 %  06/17/11 1223 131/101 mmHg 98.8 F (37.1 C) Oral - - -   Current Weight  06/16/11 49.6 kg (109 lb 5.6 oz)     Intake/Output from previous day: 02/12 0701 - 02/13 0700 In: 480 [P.O.:480] Out: 200 [Urine:200]    PHYSICAL EXAM:  Heart: RRR, tachy Lungs: coarse BS bilaterally that clear with cough Wound: clean and dry   Lab Results: CBC: Basename 06/16/11 0645  WBC 8.0  HGB 11.5*  HCT 34.8*  PLT 313   BMET:  Basename 06/16/11 0645  NA 135  K 4.2  CL 99  CO2 24  GLUCOSE 116*  BUN 9  CREATININE 0.67  CALCIUM 9.8    PT/INR: No results found for this basename: LABPROT,INR in the last 72 hours   Assessment/Plan: S/P Procedure(s) (LRB): VIDEO ASSISTED THORACOSCOPY (Right) RESECTION OF APICAL BLEB (Right) Stable, slowly improving.  Will plan discharge home today.   Home O2 ordered.   LOS: 8 days    Henson Fraticelli H 06/18/2011

## 2011-06-25 ENCOUNTER — Other Ambulatory Visit: Payer: Self-pay | Admitting: *Deleted

## 2011-06-25 DIAGNOSIS — R05 Cough: Secondary | ICD-10-CM

## 2011-06-25 MED ORDER — HYDROCOD POLST-CHLORPHEN POLST 10-8 MG/5ML PO LQCR: 5.0000 mL | Freq: Two times a day (BID) | ORAL | Status: DC | PRN

## 2011-06-27 ENCOUNTER — Other Ambulatory Visit: Payer: Self-pay | Admitting: *Deleted

## 2011-06-27 DIAGNOSIS — R600 Localized edema: Secondary | ICD-10-CM

## 2011-06-27 DIAGNOSIS — Z5181 Encounter for therapeutic drug level monitoring: Secondary | ICD-10-CM

## 2011-06-27 MED ORDER — FUROSEMIDE 40 MG PO TABS: 40.0000 mg | ORAL_TABLET | Freq: Every day | ORAL | Status: DC

## 2011-06-27 MED ORDER — POTASSIUM CHLORIDE ER 10 MEQ PO TBCR: 10.0000 meq | EXTENDED_RELEASE_TABLET | Freq: Two times a day (BID) | ORAL | Status: DC

## 2011-07-01 ENCOUNTER — Other Ambulatory Visit: Payer: Self-pay | Admitting: Cardiothoracic Surgery

## 2011-07-01 DIAGNOSIS — J930 Spontaneous tension pneumothorax: Secondary | ICD-10-CM

## 2011-07-03 ENCOUNTER — Ambulatory Visit
Admission: RE | Admit: 2011-07-03 | Discharge: 2011-07-03 | Disposition: A | Discharge status: Home / Self Care | Payer: Medicare Other | Source: Ambulatory Visit | Attending: Cardiothoracic Surgery | Admitting: Cardiothoracic Surgery

## 2011-07-03 ENCOUNTER — Encounter: Payer: Self-pay | Admitting: Cardiothoracic Surgery

## 2011-07-03 ENCOUNTER — Ambulatory Visit (INDEPENDENT_AMBULATORY_CARE_PROVIDER_SITE_OTHER): Payer: Self-pay | Admitting: Cardiothoracic Surgery

## 2011-07-03 VITALS — BP 130/76 | HR 96 | Resp 20 | Wt 105.0 lb

## 2011-07-03 DIAGNOSIS — J930 Spontaneous tension pneumothorax: Secondary | ICD-10-CM

## 2011-07-03 DIAGNOSIS — Z9889 Other specified postprocedural states: Secondary | ICD-10-CM

## 2011-07-03 DIAGNOSIS — J939 Pneumothorax, unspecified: Secondary | ICD-10-CM

## 2011-07-06 ENCOUNTER — Encounter: Payer: Self-pay | Admitting: Cardiothoracic Surgery

## 2011-07-06 NOTE — Progress Notes (Signed)
[address]            [place] [address]          [phone]       SHANDREA LUSK [facility] Medical Record #[id number] Date of Birth: 1966-02-19  Rene Paci, MD Rene Paci, MD, MD  Chief Complaint:   PostOp Follow Up Visit PROCEDURE PERFORMED: Right video-assisted thoracoscopy with stapling of  large apical and medial lower lobe pulmonary blebs for recurrent  Pneumothorax. 06/11/2011   History of Present Illness:     Do well since d/c home remains on O2 but sats improving. Still drops with exertion.  Complains of some scapular pain right.   History  Smoking status  . Former Smoker -- 3.0 packs/day for 27 years  . Types: Cigarettes  Smokeless tobacco  . Former Neurosurgeon  . Quit date: 02/07/2010  Comment: Widowed, lives alone       Allergies  Allergen Reactions  . Latex Shortness Of Breath  . Codeine Nausea And Vomiting  . Erythromycin Nausea And Vomiting    Current Outpatient Prescriptions  Medication Sig Dispense Refill  . amLODipine (NORVASC) 5 MG tablet take 1 tablet by mouth once daily  30 tablet  5  . FLUoxetine (PROZAC) 20 MG tablet Take 3 tablets (60 mg total) by mouth daily. Take 3 by mouth once daily  90 tablet  5  . guaiFENesin (MUCINEX) 600 MG 12 hr tablet Take 1 tablet (600 mg total) by mouth 2 (two) times daily. For cough.      . levETIRAcetam (KEPPRA) 250 MG tablet Take 250 mg by mouth daily.       . Multiple Vitamins-Minerals (MULTIVITAMINS THER. W/MINERALS) TABS Take 1 tablet by mouth daily.  30 each    . norethindrone (CAMILA) 0.35 MG tablet Take 1 tablet (0.35 mg total) by mouth daily.  28 tablet  11  . omeprazole (PRILOSEC) 20 MG capsule take 1 capsule by mouth once daily  30 capsule  5  . oxybutynin (DITROPAN-XL) 10 MG 24 hr tablet take 1 tablet by mouth once daily  30 tablet  5  . potassium chloride (K-DUR) 10 MEQ tablet Take 1 tablet (10 mEq total) by mouth 2 (two) times daily.  10 tablet  1        Physical Exam: BP 130/76  Pulse 96  Resp 20  Wt 105 lb (47.628 kg)  SpO2 98%  Breath sounds clear with out wheezing  Wounds:all healed well. Chest tube sutures removed  Diagnostic Studies & Laboratory data:         Recent Radiology Findings: Dg Chest 2 View  07/03/2011  *RADIOLOGY REPORT*  Clinical Data: 2 weeks follow up appointment.  CHEST - 2 VIEW  Comparison: 06/17/2011  Findings: Improving right basilar airspace opacity.  Minimal residual right base density.  Heart is normal size.  No confluent opacity on the left.  No effusions or pneumothorax.  Severe thoracolumbar scoliosis.  IMPRESSION: Improving right basilar opacity.  Original Report Authenticated By: Cyndie Chime, M.D.     Recent Labs: Lab Results  Component Value Date   WBC 8.0 06/16/2011   HGB 11.5* 06/16/2011   HCT 34.8* 06/16/2011   PLT 313 06/16/2011   GLUCOSE 116* 06/16/2011   ALT 10 06/13/2011   AST 20 06/13/2011   NA 135 06/16/2011   K 4.2 06/16/2011   CL 99  06/16/2011   CREATININE 0.67 06/16/2011   BUN 9 06/16/2011   CO2 24 06/16/2011   TSH 0.75 02/07/2008   INR 1.08 06/11/2011   HGBA1C 5.4 05/08/2008      Assessment / Plan:      S/P right blebs without recurrent PTX Weaning off O2 and DC when sat consistently over 90 With previous HX of narcotic problems avoid narcotic pain meds Follow up PRN      Delight Ovens MD 07/06/2011 9:28 PM

## 2011-07-07 ENCOUNTER — Encounter: Payer: Self-pay | Admitting: *Deleted

## 2011-07-16 ENCOUNTER — Other Ambulatory Visit: Payer: Self-pay | Admitting: Internal Medicine

## 2011-07-17 NOTE — Telephone Encounter (Signed)
Faxed script back to [facility].Marland KitchenMarland KitchenMarland Kitchen3/14/13@8 :51am/LMB

## 2011-07-21 ENCOUNTER — Other Ambulatory Visit (INDEPENDENT_AMBULATORY_CARE_PROVIDER_SITE_OTHER): Payer: Medicare Other

## 2011-07-21 ENCOUNTER — Encounter: Payer: Self-pay | Admitting: *Deleted

## 2011-07-21 ENCOUNTER — Encounter: Payer: Self-pay | Admitting: Internal Medicine

## 2011-07-21 ENCOUNTER — Ambulatory Visit (INDEPENDENT_AMBULATORY_CARE_PROVIDER_SITE_OTHER): Payer: Medicare Other | Admitting: Internal Medicine

## 2011-07-21 VITALS — BP 130/82 | HR 82 | Temp 98.0°F | Ht <= 58 in | Wt 108.8 lb

## 2011-07-21 DIAGNOSIS — I10 Essential (primary) hypertension: Secondary | ICD-10-CM

## 2011-07-21 DIAGNOSIS — Z0000 Encounter for general adult medical examination without abnormal findings: Secondary | ICD-10-CM

## 2011-07-21 DIAGNOSIS — G894 Chronic pain syndrome: Secondary | ICD-10-CM

## 2011-07-21 DIAGNOSIS — B351 Tinea unguium: Secondary | ICD-10-CM

## 2011-07-21 DIAGNOSIS — J939 Pneumothorax, unspecified: Secondary | ICD-10-CM

## 2011-07-21 DIAGNOSIS — J9383 Other pneumothorax: Secondary | ICD-10-CM

## 2011-07-21 LAB — CBC WITH DIFFERENTIAL/PLATELET
Basophils Absolute: 0.1 10*3/uL (ref 0.0–0.1)
Basophils Relative: 0.8 % (ref 0.0–3.0)
Eosinophils Absolute: 0.3 10*3/uL (ref 0.0–0.7)
MCHC: 33.8 g/dL (ref 30.0–36.0)
MCV: 91.6 fl (ref 78.0–100.0)
Monocytes Absolute: 0.6 10*3/uL (ref 0.1–1.0)
Neutro Abs: 4.4 10*3/uL (ref 1.4–7.7)
Neutrophils Relative %: 49.1 % (ref 43.0–77.0)
RBC: 4.34 Mil/uL (ref 3.87–5.11)
RDW: 14.7 % — ABNORMAL HIGH (ref 11.5–14.6)

## 2011-07-21 LAB — BASIC METABOLIC PANEL
CO2: 24 mEq/L (ref 19–32)
Chloride: 108 mEq/L (ref 96–112)
Creatinine, Ser: 0.8 mg/dL (ref 0.4–1.2)
Glucose, Bld: 99 mg/dL (ref 70–99)

## 2011-07-21 LAB — HEPATIC FUNCTION PANEL
AST: 21 U/L (ref 0–37)
Albumin: 3.8 g/dL (ref 3.5–5.2)
Alkaline Phosphatase: 103 U/L (ref 39–117)
Bilirubin, Direct: 0 mg/dL (ref 0.0–0.3)
Total Bilirubin: 0 mg/dL — ABNORMAL LOW (ref 0.3–1.2)

## 2011-07-21 LAB — LIPID PANEL
Cholesterol: 195 mg/dL (ref 0–200)
Total CHOL/HDL Ratio: 4
VLDL: 44.6 mg/dL — ABNORMAL HIGH (ref 0.0–40.0)

## 2011-07-21 MED ORDER — OXYCODONE HCL 5 MG PO TABS: 5.0000 mg | ORAL_TABLET | Freq: Three times a day (TID) | ORAL | Status: AC | PRN

## 2011-07-21 NOTE — Progress Notes (Signed)
Subjective:    Patient ID: Rhonda Ramos, female    DOB: 05/29/1965, 46 y.o.   MRN: [id number]  HPI  Here for medicare wellness/physical:  Diet: heart healthy  Physical activity: sedentary Depression/mood screen: negative Hearing: intact to whispered voice Visual acuity: grossly normal, performs annual eye exam  ADLs: capable Fall risk: none Home safety: good Cognitive evaluation: intact to orientation, naming, recall and repetition EOL planning: adv directives, full code/ I agree  I have personally reviewed and have noted 1. The patient's medical and social history 2. Their use of alcohol, tobacco or illicit drugs 3. Their current medications and supplements 4. The patient's functional ability including ADL's, fall risks, home safety risks and hearing or visual impairment. 5. Diet and physical activities 6. Evidence for depression or mood disorders   Also reviewed chronic medical issues: Recurrent right spontaneous pneumothorax. Events November 2012 and February 2013 reviewed - status post VATS 06/2011 - residual right sided chest wall pain from chest tube, no recurrent dyspnea, pleurisy or shortness of breath  S/p detox/rehab fall 2011 -  New seizure problem since fall 2011 precipitated by discontinuation of BZs and narcotics - works with neuro for same - on keppra - no recent breakthru events  HTN - reports compliance with ongoing medical treatment and no changes in medication dose or frequency. denies adverse side effects related to current therapy. No chest pain, edema or headache   depression/anxiety - reports compliance with ongoing medical treatment and no changes in medication dose or frequency. denies adverse side effects related to current therapy.  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 ? Refill fentanyl or oxy 5 as prescribed post VATS procedure February 2013?  GERD - reports compliance with ongoing medical treatment and no changes in medication dose or frequency. denies adverse side effects related to current therapy.   Past Medical History  Diagnosis Date  . Chronic pain syndrome     s/p rehab - narc detox fall 2011  . SPINA BIFIDA   . Scoliosis     multiple surg for same  . DEPRESSION     > ptsd (spouse suicide/gsw 11/2005  . GERD   . HYPERTENSION   . Anxiety   . Pneumothorax on right 04/03/11, 06/10/11    recurrent s/p VATS 2/13  . Seizures     onset fall 2011 with narc detox  . Chronic back pain greater than 3 months duration    Family History  Problem Relation Age of Onset  . Diabetes Father   . Arthritis Other     parent & grandparent  . Diabetes Other     grandparent   History  Substance Use Topics  . Smoking status: Former Smoker -- 3.0 packs/day for 27 years    Types: Cigarettes  . Smokeless tobacco: Former Neurosurgeon    Quit date: 02/07/2010   Comment: Widowed, lives alone  . Alcohol Use: No     h/o pain medicines addiction ; released from rehab 11/14/ 2011      Review of Systems  Constitutional: Positive for  fatigue. Negative for fever and unexpected weight change.  Respiratory: Negative for cough and shortness of breath.   Cardiovascular: Negative for chest pain, palpitations and leg swelling.  Musculoskeletal: Positive for back pain (chronic). Negative for joint swelling.  Neurological: Negative for dizziness or headache.  No other specific complaints in a complete review of systems (except as listed in HPI above).      Objective:   Physical Exam  BP 130/82  Pulse 82  Temp(Src)  98 F (36.7 C) (Oral)  Ht 4\' 10"  (1.473 m)  Wt 108 lb 12.8 oz (49.351 kg)  BMI 22.74 kg/m2  SpO2 96%  General:  small statured woman, older appearing than stated age Eyes:  vision grossly intact; pupils equal, round and reactive to light.  conjunctiva and lids normal.    Ears:  normal pinnae bilaterally, without erythema, swelling, or tenderness to palpation. TMs clear, without effusion, or cerumen impaction. Hearing grossly normal bilaterally  Mouth:  teeth and gums in good repair; mucous membranes moist, without lesions or ulcers. oropharynx clear without exudate, no erythema.  Neck:  thick, short - Lungs:  normal respiratory effort, no intercostal retractions or use of accessory muscles; normal breath sounds bilaterally - no crackles and no wheezes.    Heart:  normal rate, regular rhythm, no murmur, and no rub. BLE without edema. Abdomen:  soft, non-tender, normal bowel sounds, no distention; no masses and no appreciable hepatomegaly or splenomegaly.   Msk:  marked scoli deformitiy with left curvature t-spine - multiple well healed scars -  Neurologic:  alert & oriented X3 and cranial nerves II-XII symetrically intact.  strength grossly normal in all extremities, sensation intact to light touch, and gait assisted with cane. speech fluent without dysarthria or aphasia; follows commands with good comprehension.  Skin:  L thumbnail with chronic fungal infection, no rashes, vesicles, ulcers, or erythema. No nodules or irregularity to palpation.  Psych:  tremoulus, mildly anxious -Oriented X3, memory intact for recent and remote, normally interactive, and good eye contact.       Lab Results  Component Value Date   WBC 8.0 06/16/2011   HGB 11.5* 06/16/2011   HCT 34.8* 06/16/2011   PLT 313 06/16/2011   ALT 10 06/13/2011   AST 20 06/13/2011   NA 135 06/16/2011   K 4.2 06/16/2011   CL 99 06/16/2011   CREATININE 0.67 06/16/2011   BUN 9 06/16/2011   CO2 24 06/16/2011   TSH 0.75 02/07/2008   INR 1.08  06/11/2011   HGBA1C 5.4 05/08/2008   ECG:  Sinus @ 86 bpm - no ischemic changes  Assessment & Plan:  AWV/CPX - v70.0 - Today patient counseled on age appropriate routine health concerns for screening and prevention, each reviewed and up to date or declined. Immunizations reviewed and up to date or declined. Labs/last ECG reviewed. Risk factors for depression reviewed and negative. Hearing function and visual acuity are intact. ADLs screened and addressed as needed. Functional ability and level of safety reviewed and appropriate. Education, counseling and referrals performed based on assessed risks today. Patient provided with a copy of personalized plan for preventive services.   Nail fungus - refer to derm  Requests jury excuse note -initial excuse note provided from [facility] for originally scheduled jury duty on 08/11/11, but has been rescheduled for July 9 13  Also see problem list. Medications and labs reviewed today.

## 2011-07-21 NOTE — Patient Instructions (Addendum)
It was good to see you today. We have reviewed your prior records including labs and tests today Test(s) ordered today. Your results will be called to you after review (48-72hours after test completion). If any changes need to be made, you will be notified at that time. we'll make referral to pain clinic and to dermatology. Our office will contact you regarding appointment(s) once made. Limited number oxy 5 given to you to last until your pain clinic appointment -  no refills if lost prescription or any other problems with medications -  Supply should last at least 30days, no early refills if used up before 30 days Other Medications reviewed, no other changes at this time.  Please schedule followup in 6 months for blood pressure check and med review, call sooner if problems.  Also jury excuse note given to you today for 11/11/11 date as requested

## 2011-07-21 NOTE — Assessment & Plan Note (Addendum)
Related to LBP and hips and scoli with many prior surgeries No relief with tramadol, prior use with caution given sz event since off narcotics (s/p rehab) fall 2011 Limited # oxy 5 provided today - also new refer to pain mgmt Also pt continues to work thru getting to spine and scoli specialists "for new opinion"

## 2011-07-21 NOTE — Assessment & Plan Note (Signed)
The current medical regimen is effective;  continue present plan and medications. BP Readings from Last 3 Encounters:  07/21/11 130/82  07/03/11 130/76  06/18/11 138/73

## 2011-07-21 NOTE — Assessment & Plan Note (Signed)
Recurrent events 03/2011 and 06/2011 - s/p VATS 06/2011

## 2011-07-23 ENCOUNTER — Telehealth: Payer: Self-pay

## 2011-07-23 NOTE — Telephone Encounter (Signed)
i feel most comfortable with pt's pain meds managed by pain mgmt clinic - thanks

## 2011-07-23 NOTE — Telephone Encounter (Signed)
Pt advised and understood. She will expect a call from [facility] with appt info regarding referral.

## 2011-07-23 NOTE — Telephone Encounter (Signed)
Pt called requesting MD consider managing her pain medicine because she will be willing to sign a contract with MD. I advised pt that she has already been referred to a pain management clinic for this reason and that PCP usually does not manage pain medicine but she asked that request be sent to MD. Please advise.

## 2011-07-26 ENCOUNTER — Other Ambulatory Visit: Payer: Self-pay | Admitting: Internal Medicine

## 2011-08-06 ENCOUNTER — Emergency Department (HOSPITAL_COMMUNITY): Payer: Medicare Other

## 2011-08-06 ENCOUNTER — Telehealth: Payer: Self-pay | Admitting: *Deleted

## 2011-08-06 ENCOUNTER — Other Ambulatory Visit: Payer: Self-pay

## 2011-08-06 ENCOUNTER — Encounter (HOSPITAL_COMMUNITY): Payer: Self-pay

## 2011-08-06 ENCOUNTER — Inpatient Hospital Stay (HOSPITAL_COMMUNITY)
Admission: EM | Admit: 2011-08-06 | Discharge: 2011-08-22 | DRG: 208 | Disposition: A | Discharge status: Home / Self Care | Payer: Medicare Other | Source: Ambulatory Visit | Attending: Pulmonary Disease | Admitting: Pulmonary Disease

## 2011-08-06 DIAGNOSIS — A419 Sepsis, unspecified organism: Secondary | ICD-10-CM

## 2011-08-06 DIAGNOSIS — J930 Spontaneous tension pneumothorax: Secondary | ICD-10-CM

## 2011-08-06 DIAGNOSIS — E872 Acidosis, unspecified: Secondary | ICD-10-CM | POA: Diagnosis not present

## 2011-08-06 DIAGNOSIS — R000 Tachycardia, unspecified: Secondary | ICD-10-CM | POA: Diagnosis present

## 2011-08-06 DIAGNOSIS — J9383 Other pneumothorax: Principal | ICD-10-CM

## 2011-08-06 DIAGNOSIS — R5381 Other malaise: Secondary | ICD-10-CM | POA: Diagnosis present

## 2011-08-06 DIAGNOSIS — R6520 Severe sepsis without septic shock: Secondary | ICD-10-CM | POA: Diagnosis not present

## 2011-08-06 DIAGNOSIS — R569 Unspecified convulsions: Secondary | ICD-10-CM

## 2011-08-06 DIAGNOSIS — I10 Essential (primary) hypertension: Secondary | ICD-10-CM

## 2011-08-06 DIAGNOSIS — R197 Diarrhea, unspecified: Secondary | ICD-10-CM

## 2011-08-06 DIAGNOSIS — Q059 Spina bifida, unspecified: Secondary | ICD-10-CM

## 2011-08-06 DIAGNOSIS — E46 Unspecified protein-calorie malnutrition: Secondary | ICD-10-CM | POA: Diagnosis not present

## 2011-08-06 DIAGNOSIS — J9601 Acute respiratory failure with hypoxia: Secondary | ICD-10-CM

## 2011-08-06 DIAGNOSIS — J189 Pneumonia, unspecified organism: Secondary | ICD-10-CM

## 2011-08-06 DIAGNOSIS — D649 Anemia, unspecified: Secondary | ICD-10-CM | POA: Diagnosis present

## 2011-08-06 DIAGNOSIS — G40909 Epilepsy, unspecified, not intractable, without status epilepticus: Secondary | ICD-10-CM | POA: Diagnosis present

## 2011-08-06 DIAGNOSIS — G894 Chronic pain syndrome: Secondary | ICD-10-CM

## 2011-08-06 DIAGNOSIS — J939 Pneumothorax, unspecified: Secondary | ICD-10-CM

## 2011-08-06 DIAGNOSIS — F3289 Other specified depressive episodes: Secondary | ICD-10-CM

## 2011-08-06 DIAGNOSIS — A498 Other bacterial infections of unspecified site: Secondary | ICD-10-CM | POA: Diagnosis present

## 2011-08-06 DIAGNOSIS — R32 Unspecified urinary incontinence: Secondary | ICD-10-CM

## 2011-08-06 DIAGNOSIS — N390 Urinary tract infection, site not specified: Secondary | ICD-10-CM

## 2011-08-06 DIAGNOSIS — A4902 Methicillin resistant Staphylococcus aureus infection, unspecified site: Secondary | ICD-10-CM | POA: Diagnosis present

## 2011-08-06 DIAGNOSIS — K219 Gastro-esophageal reflux disease without esophagitis: Secondary | ICD-10-CM

## 2011-08-06 DIAGNOSIS — M4120 Other idiopathic scoliosis, site unspecified: Secondary | ICD-10-CM | POA: Diagnosis present

## 2011-08-06 DIAGNOSIS — R87619 Unspecified abnormal cytological findings in specimens from cervix uteri: Secondary | ICD-10-CM

## 2011-08-06 DIAGNOSIS — T474X5A Adverse effect of other laxatives, initial encounter: Secondary | ICD-10-CM | POA: Diagnosis not present

## 2011-08-06 DIAGNOSIS — F329 Major depressive disorder, single episode, unspecified: Secondary | ICD-10-CM

## 2011-08-06 DIAGNOSIS — E871 Hypo-osmolality and hyponatremia: Secondary | ICD-10-CM | POA: Diagnosis not present

## 2011-08-06 DIAGNOSIS — J9600 Acute respiratory failure, unspecified whether with hypoxia or hypercapnia: Secondary | ICD-10-CM | POA: Diagnosis not present

## 2011-08-06 DIAGNOSIS — Z87891 Personal history of nicotine dependence: Secondary | ICD-10-CM

## 2011-08-06 HISTORY — DX: Other pneumothorax: J93.83

## 2011-08-06 HISTORY — DX: Urinary tract infection, site not specified: N39.0

## 2011-08-06 LAB — COMPREHENSIVE METABOLIC PANEL
ALT: 11 U/L (ref 0–35)
AST: 16 U/L (ref 0–37)
Albumin: 4.2 g/dL (ref 3.5–5.2)
Calcium: 10.3 mg/dL (ref 8.4–10.5)
GFR calc Af Amer: >90 mL/min (ref >90)
Sodium: 138 mEq/L (ref 135–145)
Total Protein: 7.9 g/dL (ref 6.0–8.3)

## 2011-08-06 LAB — DIFFERENTIAL
Basophils Absolute: 0.1 10*3/uL (ref 0.0–0.1)
Eosinophils Absolute: 0.1 10*3/uL (ref 0.0–0.7)
Eosinophils Relative: 1 % (ref 0–5)

## 2011-08-06 LAB — URINALYSIS, ROUTINE W REFLEX MICROSCOPIC
Bilirubin Urine: NEGATIVE
Ketones, ur: NEGATIVE mg/dL
Nitrite: NEGATIVE
Specific Gravity, Urine: 1.014 (ref 1.005–1.030)
Urobilinogen, UA: 0.2 mg/dL (ref 0.0–1.0)

## 2011-08-06 LAB — CBC
MCH: 31.1 pg (ref 26.0–34.0)
MCV: 91.4 fL (ref 78.0–100.0)
Platelets: 367 10*3/uL (ref 150–400)
RDW: 14 % (ref 11.5–15.5)
WBC: 10 10*3/uL (ref 4.0–10.5)

## 2011-08-06 LAB — URINE MICROSCOPIC-ADD ON

## 2011-08-06 LAB — APTT: aPTT: 28 seconds (ref 24–37)

## 2011-08-06 MED ORDER — MIDAZOLAM HCL 2 MG/2ML IJ SOLN
INTRAMUSCULAR | Status: AC
  Filled 2011-08-06: qty 2

## 2011-08-06 MED ORDER — ADULT MULTIVITAMIN W/MINERALS CH
1.0000 | ORAL_TABLET | Freq: Every day | ORAL | Status: DC
  Administered 2011-08-07 – 2011-08-12 (×6): 1 via ORAL
  Filled 2011-08-06 (×6): qty 1

## 2011-08-06 MED ORDER — ONDANSETRON HCL 4 MG/2ML IJ SOLN: 4.0000 mg | Freq: Four times a day (QID) | INTRAMUSCULAR | Status: DC | PRN

## 2011-08-06 MED ORDER — SODIUM CHLORIDE 0.9 % IJ SOLN
3.0000 mL | Freq: Two times a day (BID) | INTRAMUSCULAR | Status: DC
  Administered 2011-08-06 – 2011-08-14 (×13): 3 mL via INTRAVENOUS
  Administered 2011-08-15: 10 mL via INTRAVENOUS
  Administered 2011-08-18 – 2011-08-22 (×8): 3 mL via INTRAVENOUS

## 2011-08-06 MED ORDER — FLUOXETINE HCL 20 MG PO CAPS
60.0000 mg | ORAL_CAPSULE | Freq: Every day | ORAL | Status: DC
  Administered 2011-08-06 – 2011-08-22 (×17): 60 mg via ORAL
  Filled 2011-08-06 (×17): qty 3

## 2011-08-06 MED ORDER — MORPHINE SULFATE 4 MG/ML IJ SOLN
INTRAMUSCULAR | Status: AC
  Administered 2011-08-06: 2 mg
  Filled 2011-08-06: qty 1

## 2011-08-06 MED ORDER — MIDAZOLAM HCL 2 MG/2ML IJ SOLN: 4.0000 mg | Freq: Once | INTRAMUSCULAR | Status: DC

## 2011-08-06 MED ORDER — NORETHINDRONE 0.35 MG PO TABS
1.0000 | ORAL_TABLET | ORAL | Status: DC
  Administered 2011-08-06 – 2011-08-11 (×5): 0.35 mg via ORAL

## 2011-08-06 MED ORDER — PROPOFOL 10 MG/ML IV EMUL
INTRAVENOUS | Status: AC
  Filled 2011-08-06: qty 20

## 2011-08-06 MED ORDER — SODIUM CHLORIDE 0.9 % IJ SOLN
3.0000 mL | Freq: Two times a day (BID) | INTRAMUSCULAR | Status: DC
  Administered 2011-08-08 – 2011-08-12 (×6): 3 mL via INTRAVENOUS

## 2011-08-06 MED ORDER — LORAZEPAM 2 MG/ML IJ SOLN
INTRAMUSCULAR | Status: AC
  Filled 2011-08-06: qty 1

## 2011-08-06 MED ORDER — MORPHINE SULFATE 2 MG/ML IJ SOLN
2.0000 mg | Freq: Once | INTRAMUSCULAR | Status: AC
  Administered 2011-08-11: 2 mg via INTRAVENOUS
  Filled 2011-08-06: qty 1

## 2011-08-06 MED ORDER — FLUOXETINE HCL 20 MG PO CAPS: 20.0000 mg | ORAL_CAPSULE | Freq: Every day | ORAL | Status: DC

## 2011-08-06 MED ORDER — PANTOPRAZOLE SODIUM 40 MG PO TBEC
40.0000 mg | DELAYED_RELEASE_TABLET | Freq: Every day | ORAL | Status: DC
  Administered 2011-08-06 – 2011-08-10 (×5): 40 mg via ORAL
  Filled 2011-08-06 (×5): qty 1

## 2011-08-06 MED ORDER — PROPOFOL 10 MG/ML IV BOLUS
INTRAVENOUS | Status: AC | PRN
  Administered 2011-08-06: 100 mg via INTRAVENOUS

## 2011-08-06 MED ORDER — CLONAZEPAM 0.5 MG PO TABS
0.5000 mg | ORAL_TABLET | Freq: Two times a day (BID) | ORAL | Status: DC
  Administered 2011-08-06 – 2011-08-11 (×11): 0.5 mg via ORAL
  Filled 2011-08-06 (×11): qty 1

## 2011-08-06 MED ORDER — SODIUM CHLORIDE 0.9 % IJ SOLN: 3.0000 mL | INTRAMUSCULAR | Status: DC | PRN

## 2011-08-06 MED ORDER — ALUM & MAG HYDROXIDE-SIMETH 200-200-20 MG/5ML PO SUSP
30.0000 mL | Freq: Four times a day (QID) | ORAL | Status: DC | PRN
  Filled 2011-08-06: qty 30

## 2011-08-06 MED ORDER — DOCUSATE SODIUM 100 MG PO CAPS
100.0000 mg | ORAL_CAPSULE | Freq: Two times a day (BID) | ORAL | Status: DC
  Administered 2011-08-08 – 2011-08-11 (×2): 100 mg via ORAL
  Filled 2011-08-06 (×12): qty 1

## 2011-08-06 MED ORDER — OXYCODONE HCL 5 MG PO TABS
5.0000 mg | ORAL_TABLET | ORAL | Status: DC | PRN
  Administered 2011-08-06: 5 mg via ORAL
  Filled 2011-08-06: qty 1

## 2011-08-06 MED ORDER — ONDANSETRON HCL 4 MG PO TABS: 4.0000 mg | ORAL_TABLET | Freq: Four times a day (QID) | ORAL | Status: DC | PRN

## 2011-08-06 MED ORDER — CLONAZEPAM 0.5 MG PO TABS: 0.5000 mg | ORAL_TABLET | Freq: Every day | ORAL | Status: DC

## 2011-08-06 MED ORDER — LORAZEPAM 2 MG/ML IJ SOLN
1.0000 mg | Freq: Once | INTRAMUSCULAR | Status: AC
  Administered 2011-08-06: 1 mg via INTRAVENOUS

## 2011-08-06 MED ORDER — MIDAZOLAM HCL 2 MG/2ML IJ SOLN
INTRAMUSCULAR | Status: AC
  Filled 2011-08-06: qty 4

## 2011-08-06 MED ORDER — THERA M PLUS PO TABS: 1.0000 | ORAL_TABLET | Freq: Every day | ORAL | Status: DC

## 2011-08-06 MED ORDER — SODIUM CHLORIDE 0.9 % IV SOLN: 250.0000 mL | INTRAVENOUS | Status: DC | PRN

## 2011-08-06 MED ORDER — MORPHINE SULFATE 4 MG/ML IJ SOLN
INTRAMUSCULAR | Status: AC
  Filled 2011-08-06: qty 1

## 2011-08-06 MED ORDER — LEVETIRACETAM 250 MG PO TABS
250.0000 mg | ORAL_TABLET | Freq: Every day | ORAL | Status: DC
  Administered 2011-08-06 – 2011-08-11 (×6): 250 mg via ORAL
  Filled 2011-08-06 (×6): qty 1

## 2011-08-06 MED ORDER — AMLODIPINE BESYLATE 5 MG PO TABS
5.0000 mg | ORAL_TABLET | Freq: Every day | ORAL | Status: DC
  Administered 2011-08-06 – 2011-08-12 (×7): 5 mg via ORAL
  Filled 2011-08-06 (×8): qty 1

## 2011-08-06 MED ORDER — MORPHINE SULFATE 4 MG/ML IJ SOLN
4.0000 mg | Freq: Once | INTRAMUSCULAR | Status: AC
  Administered 2011-08-06: 4 mg via INTRAVENOUS

## 2011-08-06 MED ORDER — OXYBUTYNIN CHLORIDE ER 10 MG PO TB24
10.0000 mg | ORAL_TABLET | Freq: Every day | ORAL | Status: DC
  Administered 2011-08-06 – 2011-08-12 (×6): 10 mg via ORAL
  Filled 2011-08-06 (×9): qty 1

## 2011-08-06 MED ORDER — MIDAZOLAM HCL 2 MG/2ML IJ SOLN
2.0000 mg | Freq: Once | INTRAMUSCULAR | Status: AC
  Administered 2011-08-06: 2 mg via INTRAVENOUS

## 2011-08-06 MED ORDER — LORAZEPAM 2 MG/ML IJ SOLN
0.5000 mg | Freq: Once | INTRAMUSCULAR | Status: AC
  Administered 2011-08-06: 0.5 mg via INTRAVENOUS
  Filled 2011-08-06: qty 1

## 2011-08-06 NOTE — H&P (Signed)
[address]            [name] [address]          [phone]     Rhonda Ramos is an 46 y.o. female.  @bday  OZH:[id number] Chief Complaint: Shortness of breath  HPI:The patient is well known to [facility] and associates having undergone a right video-assisted thoracoscopy for stapling of blebs with mechanical pleurodesis this February by Dr. Sheliah Plane. She has a long-term chronic pain syndrome related to spina bifida and scoliosis with multiple previous back surgeries. Yesterday she felt "different". She felt a little bit more short of breath and was having a little bit more pain than usual. She denies fevers chills or other constitutional symptoms. She denies cough. She has had no recent cold or flu symptoms.    Past Medical History  Diagnosis Date  . Chronic pain syndrome     s/p rehab - narc detox fall 2011  . SPINA BIFIDA   . Scoliosis     multiple surg for same  . DEPRESSION     > ptsd (spouse suicide/gsw 11/2005  . GERD   . HYPERTENSION   . Anxiety   . Pneumothorax on right 04/03/11, 06/10/11    recurrent s/p VATS 2/13  . Seizures     onset fall 2011 with narc detox  . Chronic back pain greater than 3 months duration     Past Surgical History  Procedure Date  . Lumbar laminectomy for tethered cord release     x's 4 (84,94,98, and 02)  . 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 Scoliosis 1982    neck fused w/bones  . Chest tube placement 04/03/11    S/P large right sided pneumo  . Back surgery   . Video assisted thoracoscopy 06/11/2011    Procedure: VIDEO ASSISTED THORACOSCOPY;  Surgeon: Delight Ovens, MD;  Location: [facility] OR;  Service: Thoracic;  Laterality: Right;  Stapling of Large Apical and Inferiors blebs; Mechanical Pleurodesis   . Resection of apical bleb 06/11/2011    Procedure: RESECTION OF APICAL BLEB;  Surgeon: Delight Ovens, MD;  Location: [facility] OR;  Service: Thoracic;  Laterality: Right;  and inferior bleb    Family  History  Problem Relation Age of Onset  . Diabetes Father   . Arthritis Other     parent & grandparent  . Diabetes Other     grandparent   Social History:  reports that she has quit smoking. Her smoking use included Cigarettes. She has a 81 pack-year smoking history. She quit smokeless tobacco use about 17 months ago. She reports that she uses illicit drugs (Other-see comments). She reports that she does not drink alcohol.  Allergies:  Allergies  Allergen Reactions  . Latex Shortness Of Breath  . Codeine Nausea And Vomiting  . Erythromycin Nausea And Vomiting    Medications Prior to Admission  Medication Dose Route Frequency Provider Last Rate Last Dose  . LORazepam (ATIVAN) 2 MG/ML injection           . LORazepam (ATIVAN) injection 0.5 mg  0.5 mg Intravenous Once Hilario Quarry, MD   0.5 mg at 08/06/11 1355  . LORazepam (ATIVAN) injection 1 mg  1 mg Intravenous Once Hilario Quarry, MD   1 mg at 08/06/11 1230   Medications Prior to Admission  Medication Sig Dispense Refill  .  amLODipine (NORVASC) 5 MG tablet take 1 tablet by mouth once daily  30 tablet  5  . clonazePAM (KLONOPIN) 0.5 MG tablet take 1 tablet by mouth twice a day if needed for anxiety  60 tablet  1  . FLUoxetine (PROZAC) 20 MG capsule take 3 capsules by mouth once daily  90 capsule  5  . levETIRAcetam (KEPPRA) 250 MG tablet Take 250 mg by mouth daily.       . Multiple Vitamins-Minerals (MULTIVITAMINS THER. W/MINERALS) TABS Take 1 tablet by mouth daily.  30 each    . norethindrone (CAMILA) 0.35 MG tablet Take 1 tablet (0.35 mg total) by mouth daily.  28 tablet  11  . omeprazole (PRILOSEC) 20 MG capsule take 1 capsule by mouth once daily  30 capsule  5  . oxybutynin (DITROPAN-XL) 10 MG 24 hr tablet take 1 tablet by mouth once daily  30 tablet  5  . DISCONTD: potassium chloride (K-DUR) 10 MEQ tablet Take 1 tablet (10 mEq total) by mouth 2 (two) times daily.  10 tablet  1  oxycodone 5mg  every 4-6 hours  Results for  orders placed during the hospital encounter of 08/06/11 (from the past 48 hour(s))  CBC     Status: Abnormal   Collection Time   08/06/11 12:30 PM      Component Value Range Comment   WBC 10.0  4.0 - 10.5 (K/uL)    RBC 4.89  3.87 - 5.11 (MIL/uL)    Hemoglobin 15.2 (*) 12.0 - 15.0 (g/dL)    HCT 40.9  81.1 - 91.4 (%)    MCV 91.4  78.0 - 100.0 (fL)    MCH 31.1  26.0 - 34.0 (pg)    MCHC 34.0  30.0 - 36.0 (g/dL)    RDW 78.2  95.6 - 21.3 (%)    Platelets 367  150 - 400 (K/uL)   DIFFERENTIAL     Status: Normal   Collection Time   08/06/11 12:30 PM      Component Value Range Comment   Neutrophils Relative 77  43 - 77 (%)    Neutro Abs 7.7  1.7 - 7.7 (K/uL)    Lymphocytes Relative 15  12 - 46 (%)    Lymphs Abs 1.5  0.7 - 4.0 (K/uL)    Monocytes Relative 6  3 - 12 (%)    Monocytes Absolute 0.6  0.1 - 1.0 (K/uL)    Eosinophils Relative 1  0 - 5 (%)    Eosinophils Absolute 0.1  0.0 - 0.7 (K/uL)    Basophils Relative 1  0 - 1 (%)    Basophils Absolute 0.1  0.0 - 0.1 (K/uL)   COMPREHENSIVE METABOLIC PANEL     Status: Abnormal   Collection Time   08/06/11 12:30 PM      Component Value Range Comment   Sodium 138  135 - 145 (mEq/L)    Potassium 4.0  3.5 - 5.1 (mEq/L)    Chloride 103  96 - 112 (mEq/L)    CO2 25  19 - 32 (mEq/L)    Glucose, Bld 115 (*) 70 - 99 (mg/dL)    BUN 12  6 - 23 (mg/dL)    Creatinine, Ser 0.86  0.50 - 1.10 (mg/dL)    Calcium 57.8  8.4 - 10.5 (mg/dL)    Total Protein 7.9  6.0 - 8.3 (g/dL)    Albumin 4.2  3.5 - 5.2 (g/dL)    AST 16  0 - 37 (U/L)  ALT 11  0 - 35 (U/L)    Alkaline Phosphatase 119 (*) 39 - 117 (U/L)    Total Bilirubin 0.6  0.3 - 1.2 (mg/dL)    GFR calc non Af Amer 88 (*) >90 (mL/min)    GFR calc Af Amer >90  >90 (mL/min)   PROTIME-INR     Status: Normal   Collection Time   08/06/11 12:30 PM      Component Value Range Comment   Prothrombin Time 13.3  11.6 - 15.2 (seconds)    INR 0.99  0.00 - 1.49    APTT     Status: Normal   Collection Time   08/06/11  12:30 PM      Component Value Range Comment   aPTT 28  24 - 37 (seconds)   URINALYSIS, ROUTINE W REFLEX MICROSCOPIC     Status: Abnormal   Collection Time   08/06/11 12:54 PM      Component Value Range Comment   Color, Urine YELLOW  YELLOW     APPearance CLEAR  CLEAR     Specific Gravity, Urine 1.014  1.005 - 1.030     pH 6.0  5.0 - 8.0     Glucose, UA NEGATIVE  NEGATIVE (mg/dL)    Hgb urine dipstick TRACE (*) NEGATIVE     Bilirubin Urine NEGATIVE  NEGATIVE     Ketones, ur NEGATIVE  NEGATIVE (mg/dL)    Protein, ur 161 (*) NEGATIVE (mg/dL)    Urobilinogen, UA 0.2  0.0 - 1.0 (mg/dL)    Nitrite NEGATIVE  NEGATIVE     Leukocytes, UA NEGATIVE  NEGATIVE    URINE MICROSCOPIC-ADD ON     Status: Abnormal   Collection Time   08/06/11 12:54 PM      Component Value Range Comment   Squamous Epithelial / LPF RARE  RARE     WBC, UA 0-2  <3 (WBC/hpf)    Bacteria, UA MANY (*) RARE     Dg Chest Portable 1 View  08/06/2011  *RADIOLOGY REPORT*  Clinical Data: Shortness of breath  PORTABLE CHEST - 1 VIEW  Comparison: 07/03/2011  Findings: Cardiac size is stable.  There is at least 30 to 40% right upper, right lower lateral and right medial pneumothorax with partial collapse of the right middle lobe, upper lobe and right lower lobe.  Mild left basilar atelectasis.  Levoscoliosis thoracolumbar spine again noted.  IMPRESSION:  There is at least 30 to 40% right upper, right lower lateral and right medial pneumothorax with partial collapse of the right middle lobe and right lower lobe.  Mild left basilar atelectasis.  Critical findings discussed immediately with Dr. Rosalia Hammers from emergency room.  Original Report Authenticated By: Natasha Mead, M.D.   Review of Systems - Negative except as stated above and she does report that there is a change in the smell of her urine. She feels that this could possibly be related to a urinary tract infection.   Blood pressure 143/96, pulse 124, temperature 100.3 F (37.9 C),  temperature source Oral, resp. rate 18, SpO2 96.00%.Physical Examination: General appearance - chronically ill-appearing female in mild discomfort. Eyes - pupils equal and reactive, extraocular eye movements intact, sclera anicteric Mouth - mucous membranes moist, pharynx normal without lesions and edentulous Neck - supple, no significant adenopathy Lymphatics - no palpable lymphadenopathy Chest - slightly diminished breath sounds in the right base. Otherwise remarkably clear Heart - S1 and S2 normal, tachycardic Abdomen - soft, nontender, nondistended, no masses or organomegaly  Back exam - scoliosis noted Musculoskeletal - chronic weakness and deconditioning Extremities - no pedal edema noted, extremities warm and well perfused Skin - normal coloration and turgor, no rashes, no suspicious skin lesions noted    Assessment/Plan 30-40% right upper, right lower lateral and right medial pneumothorax with partial collapse of the right middle lobe and right lower lobe.  We will obtain a chest CT scan and plan for placement of chest tube and admit to 2000 for further management.  Florice Hindle E 08/06/2011, 2:43 PM

## 2011-08-06 NOTE — ED Provider Notes (Signed)
History     CSN: [id number]  Arrival date & time 08/06/11  1152   First MD Initiated Contact with Patient 08/06/11 1210      Chief Complaint  Patient presents with  . Shortness of Breath    recent hx of pnemothorax    (Consider location/radiation/quality/duration/timing/severity/associated sxs/prior treatment) HPI Patient with history spontaneous pneumothorax x 2 on right/ s/p surgery February, now with chest pain and dyspnea began "a little bit" yesterday.  Now worse and feel "sick" with nausea.  Patient states she is very anxious.  She thinks she may have a uti as urine is dark, patient self caths due to spina bifida.    Past Medical History  Diagnosis Date  . Chronic pain syndrome     s/p rehab - narc detox fall 2011  . SPINA BIFIDA   . Scoliosis     multiple surg for same  . DEPRESSION     > ptsd (spouse suicide/gsw 11/2005  . GERD   . HYPERTENSION   . Anxiety   . Pneumothorax on right 04/03/11, 06/10/11    recurrent s/p VATS 2/13  . Seizures     onset fall 2011 with narc detox  . Chronic back pain greater than 3 months duration     Past Surgical History  Procedure Date  . Lumbar laminectomy for tethered cord release     x's 4 (84,94,98, and 02)  . 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 Scoliosis 1982    neck fused w/bones  . Chest tube placement 04/03/11    S/P large right sided pneumo  . Back surgery   . Video assisted thoracoscopy 06/11/2011    Procedure: VIDEO ASSISTED THORACOSCOPY;  Surgeon: Delight Ovens, MD;  Location: [facility] OR;  Service: Thoracic;  Laterality: Right;  Stapling of Large Apical and Inferiors blebs; Mechanical Pleurodesis   . Resection of apical bleb 06/11/2011    Procedure: RESECTION OF APICAL BLEB;  Surgeon: Delight Ovens, MD;  Location: [facility] OR;  Service: Thoracic;  Laterality: Right;  and inferior bleb    Family History  Problem Relation Age of Onset  . Diabetes Father   . Arthritis Other     parent & grandparent  . Diabetes  Other     grandparent    History  Substance Use Topics  . Smoking status: Former Smoker -- 3.0 packs/day for 27 years    Types: Cigarettes  . Smokeless tobacco: Former Neurosurgeon    Quit date: 02/07/2010   Comment: Widowed, lives alone  . 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    Allergies  Latex; Codeine; and Erythromycin  Home Medications   Current Outpatient Rx  Name Route Sig Dispense Refill  . AMLODIPINE BESYLATE 5 MG PO TABS  take 1 tablet by mouth once daily 30 tablet 5  . CLONAZEPAM 0.5 MG PO TABS  take 1 tablet by mouth twice a day if needed for anxiety 60 tablet 1  . FLUOXETINE HCL 20 MG PO CAPS  take 3 capsules by mouth once daily 90 capsule 5  . LEVETIRACETAM 250 MG PO TABS Oral Take 250 mg by mouth daily.     Marland Kitchen  THERA M PLUS PO TABS Oral Take 1 tablet by mouth daily. 30 each   . NORETHINDRONE 0.35 MG PO TABS Oral Take 1 tablet (0.35 mg total) by mouth daily. 28 tablet 11  . OMEPRAZOLE 20 MG PO CPDR  take 1 capsule by mouth once daily 30 capsule 5  . OXYBUTYNIN CHLORIDE ER 10 MG PO TB24  take 1 tablet by mouth once daily 30 tablet 5    BP 156/102  Pulse 112  Temp(Src) 98.6 F (37 C) (Oral)  Resp 23  SpO2 100%  Physical Exam  Nursing note and vitals reviewed. Constitutional: She appears well-nourished.       Anxious appearing, on nrb, tachypneic  HENT:  Head: Normocephalic and atraumatic.  Eyes: Conjunctivae and EOM are normal. Pupils are equal, round, and reactive to light.  Neck: Normal range of motion. Neck supple.  Cardiovascular: Tachycardia present.   Pulmonary/Chest:       Breaths sounds decreased r>l  Abdominal: Soft. Bowel sounds are normal.  Musculoskeletal:       Bilateral lower extremity atrophy  Neurological: She is alert.  Skin: Skin is  warm.    ED Course  Procedures (including critical care time)  Labs Reviewed - No data to display No results found.   No diagnosis found.    MDM  Patient with 30% right pneumothorax discussed with Dr. pop. I called Dr. Cornelius Moras and discussed the patient's care via Diane in the operating room. The patient will be transferred to the emergency department via [facility]. Her O2 sats have remained in the 98-100% range on nasal cannula. Her blood pressure remained elevated with a systolic murmur pressure of 150. She is tachycardic at 110. She does not appear to be in respiratory distress she received Ativan 1 mg and is awake and alert continues to be anxious. She is receiving additional Ativan 0.5 mg IV.  Hilario Quarry, MD 08/11/11 [phone]

## 2011-08-06 NOTE — ED Notes (Signed)
Patient transported to CT 

## 2011-08-06 NOTE — Op Note (Signed)
CARDIOTHORACIC SURGERY OPERATIVE NOTE  Date of Procedure: 08/06/2011  Preoperative Diagnosis: Recurrent Right Spontaneous Pneumothorax  Postoperative Diagnosis: Same  Procedure: Right chest tube placement  Surgeon: Salvatore Decent. Cornelius Moras, MD  Anesthesia: 1% lidocaine local with intravenous sedation    DETAILS OF THE OPERATIVE PROCEDURE  Following full informed consent the patient was given midazolam, morphine and propofol intravenously and continuously monitored  for rhythm, BP and oxygen saturation under the care and supervision of Dr Ranae Palms. The right chest was prepared and draped in a sterile manner. 1% lidocaine was utilized to anesthetize the skin and subcutaneous tissues. A small incision was made and a 28 French straight chest tube was placed through the incision into the pleural space. The tube was secured to the skin and connected to a closed suction collection device. The patient tolerated the procedure well. A portable CXR was ordered. There were no complications.    Salvatore Decent. Cornelius Moras, MD

## 2011-08-06 NOTE — ED Notes (Signed)
2021-01 Ready

## 2011-08-06 NOTE — ED Notes (Addendum)
Dr Ranae Palms at bedside for sedation; Dr Cornelius Moras at bedside to place chest tube

## 2011-08-06 NOTE — Telephone Encounter (Signed)
Rhonda Ramos had a R VATS for stapling of large apical and inferior blebs and mechanical pleurodesis for recurrent pneumothorax on 06/11/11.  She was last seen for follow up on 06/2811, doing well.  She called this morning c/o shortness of breath..."I can breathe lying down, but when I stand up I have trouble".  Dr. Tyrone Sage told me to call if this happened and come to the office to get a chest xray so that I would not have to wait so long in the ER." I explained that we didn't have a doctor in the office until the afternoon.  I felt the best advice was for her to go to the ER.  She reluctantly agreed.

## 2011-08-06 NOTE — H&P (Signed)
I have seen and examined the patient and agree with the assessment and plan as outlined.  Will get non-contrast chest CT to guide chest tube placement.  Romi Rathel H 08/06/2011 3:30 PM

## 2011-08-06 NOTE — ED Notes (Signed)
Vital signs stable. 

## 2011-08-06 NOTE — ED Notes (Signed)
Pt has hx of recent pneumothorax. Sob and anxiety today on ems arrival. Pt is alert no c/o chest pain

## 2011-08-07 ENCOUNTER — Inpatient Hospital Stay (HOSPITAL_COMMUNITY): Payer: Medicare Other

## 2011-08-07 DIAGNOSIS — J930 Spontaneous tension pneumothorax: Secondary | ICD-10-CM

## 2011-08-07 LAB — BASIC METABOLIC PANEL
BUN: 15 mg/dL (ref 6–23)
Chloride: 102 mEq/L (ref 96–112)
Creatinine, Ser: 0.75 mg/dL (ref 0.50–1.10)
GFR calc Af Amer: >90 mL/min (ref >90)
Glucose, Bld: 113 mg/dL — ABNORMAL HIGH (ref 70–99)

## 2011-08-07 LAB — CBC
HCT: 39.3 % (ref 36.0–46.0)
Hemoglobin: 13.3 g/dL (ref 12.0–15.0)
MCV: 90.3 fL (ref 78.0–100.0)
RDW: 14 % (ref 11.5–15.5)
WBC: 10.1 10*3/uL (ref 4.0–10.5)

## 2011-08-07 LAB — MRSA PCR SCREENING: MRSA by PCR: POSITIVE — AB

## 2011-08-07 MED ORDER — SALINE SPRAY 0.65 % NA SOLN
1.0000 | NASAL | Status: DC | PRN
  Administered 2011-08-07: 1 via NASAL
  Filled 2011-08-07: qty 44

## 2011-08-07 MED ORDER — PROMETHAZINE HCL 25 MG/ML IJ SOLN
12.5000 mg | Freq: Four times a day (QID) | INTRAMUSCULAR | Status: DC | PRN
  Administered 2011-08-07 – 2011-08-09 (×4): 12.5 mg via INTRAVENOUS
  Filled 2011-08-07 (×5): qty 1

## 2011-08-07 MED ORDER — ALPRAZOLAM 0.25 MG PO TABS
0.2500 mg | ORAL_TABLET | Freq: Three times a day (TID) | ORAL | Status: DC | PRN
  Administered 2011-08-07 – 2011-08-11 (×6): 0.25 mg via ORAL
  Filled 2011-08-07 (×6): qty 1

## 2011-08-07 MED ORDER — FENTANYL 50 MCG/HR TD PT72: 50.0000 ug | MEDICATED_PATCH | TRANSDERMAL | Status: DC

## 2011-08-07 MED ORDER — OXYCODONE HCL 5 MG PO TABS
10.0000 mg | ORAL_TABLET | ORAL | Status: DC | PRN
  Administered 2011-08-07 – 2011-08-11 (×21): 10 mg via ORAL
  Filled 2011-08-07: qty 2
  Filled 2011-08-07: qty 3
  Filled 2011-08-07 (×19): qty 2

## 2011-08-07 MED ORDER — FENTANYL 50 MCG/HR TD PT72
50.0000 ug | MEDICATED_PATCH | Freq: Once | TRANSDERMAL | Status: AC
  Administered 2011-08-07: 50 ug via TRANSDERMAL
  Filled 2011-08-07 (×2): qty 1

## 2011-08-07 NOTE — Progress Notes (Addendum)
Subjective:  Rhonda Ramos complains of pain at her chest tube site this morning.  She states that pain medication does relieve the pain.  She also complains of some dryness in her nose and is requesting nasal saline   Objective:  Vital Signs in the last 24 hours: Temp:  [97 F (36.1 C)-100.3 F (37.9 C)] 98.7 F (37.1 C) (04/04 0553) Pulse Rate:  [93-124] 93  (04/04 0553) Resp:  [14-34] 20  (04/04 0553) BP: (96-169)/(46-145) 139/90 mmHg (04/04 0553) SpO2:  [90 %-100 %] 90 % (04/04 0553) Weight:  [104 lb 0.9 oz (47.2 kg)] 104 lb 0.9 oz (47.2 kg) (04/03 1856)  Intake/Output from previous day: 04/03 0701 - 04/04 0700 In: 1000 [I.V.:1000] Out: -  Intake/Output from this shift: Total I/O In: -  Out: 350 [Urine:350]  Physical Exam: General appearance: alert and no distress Lungs: diminished breath sounds RUL Heart: regular rate and rhythm Abdomen: soft, non-tender; bowel sounds normal; no masses,  no organomegaly Extremities: extremities normal, atraumatic, no cyanosis or edema Skin: chest tube site clean, with some clear blood tinged drainage  Lab Results:  Basename 08/07/11 0545 08/06/11 1230  WBC 10.1 10.0  HGB 13.3 15.2*  PLT 328 367    Basename 08/07/11 0545 08/06/11 1230  NA 137 138  K 3.3* 4.0  CL 102 103  CO2 26 25  GLUCOSE 113* 115*  BUN 15 12  CREATININE 0.75 0.80   No results found for this basename: TROPONINI:2,CK,MB:2 in the last 72 hours Hepatic Function Panel  Basename 08/06/11 1230  PROT 7.9  ALBUMIN 4.2  AST 16  ALT 11  ALKPHOS 119*  BILITOT 0.6  BILIDIR --  IBILI --   No results found for this basename: CHOL in the last 72 hours No results found for this basename: PROTIME in the last 72 hours  Imaging: Imaging results have been reviewed: CXR in place on right side, small residual apical pneumothorax present  Assessment/Plan:   1. Right Spontaneous Pneumothorax 2. S/P Right chest tube placement- no air leak appreciated this morning,  will leave chest tube to suction, get CXR in AM 3. Nasal Dryness- will order nasal saline 4. Continue to monitor   LOS: 1 day    BARRETT, ERIN 08/07/2011, 9:08 AM    I have seen and examined the patient and agree with the assessment and plan as outlined.  Nielle Duford H 08/07/2011 6:02 PM

## 2011-08-07 NOTE — Progress Notes (Signed)
UR Completed.  Rhonda Ramos [name] [phone] 08/07/2011  

## 2011-08-08 ENCOUNTER — Inpatient Hospital Stay (HOSPITAL_COMMUNITY): Payer: Medicare Other

## 2011-08-08 ENCOUNTER — Encounter (HOSPITAL_COMMUNITY): Payer: Self-pay | Admitting: Thoracic Surgery (Cardiothoracic Vascular Surgery)

## 2011-08-08 DIAGNOSIS — J930 Spontaneous tension pneumothorax: Secondary | ICD-10-CM

## 2011-08-08 DIAGNOSIS — N390 Urinary tract infection, site not specified: Secondary | ICD-10-CM

## 2011-08-08 HISTORY — DX: Urinary tract infection, site not specified: N39.0

## 2011-08-08 LAB — URINE CULTURE
Colony Count: >=100000
Culture  Setup Time: 201304040013

## 2011-08-08 MED ORDER — CIPROFLOXACIN HCL 500 MG PO TABS
500.0000 mg | ORAL_TABLET | Freq: Two times a day (BID) | ORAL | Status: DC
  Administered 2011-08-08 – 2011-08-11 (×6): 500 mg via ORAL
  Filled 2011-08-08 (×10): qty 1

## 2011-08-08 NOTE — Progress Notes (Addendum)
Subjective:  Ms. Fenter states that she is sick this morning.  She states she is nauseated but has not yet vomited.  She also states that she has a UTI which she gets all the time and need antibiotics for it.    Objective:  Vital Signs in the last 24 hours: Temp:  [98.4 F (36.9 C)-99.3 F (37.4 C)] 98.4 F (36.9 C) (04/05 0523) Pulse Rate:  [119-127] 127  (04/05 0523) Resp:  [12-17] 17  (04/05 0523) BP: (125-142)/(82-93) 138/93 mmHg (04/05 0523) SpO2:  [90 %-91 %] 91 % (04/05 0523)  Intake/Output from previous day: 04/04 0701 - 04/05 0700 In: 1167 [P.O.:1167] Out: 850 [Urine:850] Intake/Output from this shift:   Physical Exam: General appearance: alert, distracted and visibly upset Lungs: diminished breath sounds on right Heart: regular rate and rhythm, S1, S2 normal, no murmur, click, rub or gallop Abdomen: soft, non-tender; bowel sounds normal; no masses,  no organomegaly Skin: chest tube site has drainage around tube Neurologic: Grossly normal  Lab Results:  Basename 08/07/11 0545 08/06/11 1230  WBC 10.1 10.0  HGB 13.3 15.2*  PLT 328 367    Basename 08/07/11 0545 08/06/11 1230  NA 137 138  K 3.3* 4.0  CL 102 103  CO2 26 25  GLUCOSE 113* 115*  BUN 15 12  CREATININE 0.75 0.80   No results found for this basename: TROPONINI:2,CK,MB:2 in the last 72 hours Hepatic Function Panel  Basename 08/06/11 1230  PROT 7.9  ALBUMIN 4.2  AST 16  ALT 11  ALKPHOS 119*  BILITOT 0.6  BILIDIR --  IBILI --   No results found for this basename: CHOL in the last 72 hours No results found for this basename: PROTIME in the last 72 hours  Imaging: Imaging results have been reviewed: continued pneumothorax, chest tube appears to have retracted slighly  Assessment/Plan:   1. Spontaneous Pneumothorax 2. S/P Right side chest tube placement- tube appears to be well sutured in place, will leave on suction 3. Nausea- continue Zofran 4. Per patient UTI-patient straight caths self,  will send UA, if positive will start ABC 5. Continue to monitor    LOS: 2 days    BARRETT, ERIN 08/08/2011, 1:17 PM    URINE, CATHETERIZED   Special Requests:    Normal   Culture Setup Time:    [id number]   Colony Count:    >=100,000 COLONIES/ML   Culture:    ESCHERICHIA COLI   Report Status:    08/08/2011 FINAL   Organism ID, Bacteria:    ESCHERICHIA COLI      Culture & Susceptibility     ESCHERICHIA COLI        Antibiotic  Sensitivity  Microscan  Status     AMPICILLIN  Sensitive  8  Final     Method:  MIC     CEFAZOLIN  Sensitive  <=4  Final     Method:  MIC     CEFTRIAXONE  Sensitive  <=1  Final     Method:  MIC     CIPROFLOXACIN  Sensitive  <=0.25  Final     Method:  MIC     GENTAMICIN  Sensitive  <=1  Final     Method:  MIC     LEVOFLOXACIN  Sensitive  <=0.12  Final     Method:  MIC     NITROFURANTOIN  Sensitive  <=16  Final     Method:  MIC     PIP/TAZO  Sensitive  <=  4  Final     Method:  MIC     TOBRAMYCIN  Sensitive  <=1  Final     Method:  MIC     TRIMETH/SULFA  Sensitive  <=20  Final     Method:  MIC      Comments  ESCHERICHIA COLI (MIC)       ESCHERICHIA COLI    I have seen and examined the patient and agree with the assessment and plan as outlined.  Will start Cipro for UTI.  No air leak on exam.  Will place tube to H2O seal.  Nakhia Levitan H 08/08/2011 1:50 PM

## 2011-08-09 ENCOUNTER — Inpatient Hospital Stay (HOSPITAL_COMMUNITY): Payer: Medicare Other

## 2011-08-09 MED ORDER — FENTANYL 50 MCG/HR TD PT72
50.0000 ug | MEDICATED_PATCH | Freq: Once | TRANSDERMAL | Status: DC
  Administered 2011-08-09: 50 ug via TRANSDERMAL
  Filled 2011-08-09: qty 1

## 2011-08-09 NOTE — Progress Notes (Addendum)
                    [address]            [name] [address]          [phone]          Subjective: Wants Fentanyl patch reordered.  Breathing stable.  Objective: Vital signs in last 24 hours: Patient Vitals for the past 24 hrs:  BP Temp Temp src Pulse Resp SpO2  08/09/11 0420 111/76 mmHg 98.8 F (37.1 C) Oral 110  18  90 %  08/08/11 2100 133/85 mmHg 100 F (37.8 C) Oral 111  18  90 %  08/08/11 1415 119/81 mmHg 98.3 F (36.8 C) Oral 111  18  90 %   Current Weight  08/06/11 47.2 kg (104 lb 0.9 oz)     Intake/Output from previous day: 04/05 0701 - 04/06 0700 In: -  Out: 715 [Urine:700; Chest Tube:15]    PHYSICAL EXAM:  Heart: RRR Lungs: clear bilaterally Chest tube: no air leak  Lab Results: CBC: Basename 08/07/11 0545 08/06/11 1230  WBC 10.1 10.0  HGB 13.3 15.2*  HCT 39.3 44.7  PLT 328 367   BMET:  Basename 08/07/11 0545 08/06/11 1230  NA 137 138  K 3.3* 4.0  CL 102 103  CO2 26 25  GLUCOSE 113* 115*  BUN 15 12  CREATININE 0.75 0.80  CALCIUM 10.0 10.3    PT/INR:  Basename 08/06/11 1230  LABPROT 13.3  INR 0.99     Assessment/Plan: No CXR done this am.  Will order.   Cipro D#2 for E.coli UTI. Pulm toilet, ambulate.   LOS: 3 days    COLLINS,GINA H 08/09/2011  Patient seen and examined. No air leak at present, CT is on water seal. CXR stable Recheck CXR in AM

## 2011-08-09 NOTE — Progress Notes (Signed)
Chest tube had 15cc output during the night

## 2011-08-10 ENCOUNTER — Inpatient Hospital Stay (HOSPITAL_COMMUNITY): Payer: Medicare Other

## 2011-08-10 LAB — CBC
Hemoglobin: 13 g/dL (ref 12.0–15.0)
MCHC: 33.4 g/dL (ref 30.0–36.0)
RBC: 4.26 MIL/uL (ref 3.87–5.11)

## 2011-08-10 MED ORDER — ACETAMINOPHEN 325 MG PO TABS
650.0000 mg | ORAL_TABLET | ORAL | Status: DC | PRN
  Administered 2011-08-10 – 2011-08-19 (×3): 650 mg via ORAL
  Filled 2011-08-10 (×4): qty 2

## 2011-08-10 MED ORDER — FENTANYL 50 MCG/HR TD PT72
50.0000 ug | MEDICATED_PATCH | Freq: Once | TRANSDERMAL | Status: DC
  Administered 2011-08-10: 50 ug via TRANSDERMAL

## 2011-08-10 NOTE — Progress Notes (Signed)
Pt reports itching around her mouth, chin and nose. There is redness and dryness visible. Applied lotion to areas, will continue to monitor.

## 2011-08-10 NOTE — Progress Notes (Signed)
Chest tube DC'd per order and per protocol.  Pt pre-medicated but anxious and did not tolerate well.  Chest tube pulled and suture remaining was already knotted at the site with wound largely unapproximated.  Reported to PA.  Was instructed to apply steri strips to attempt site closure.  Vaseline gauze residue was thoroughly removed and benzoin applied, steri strips applied.  Wound still not approximated.  Dry 4 x 4's applied as vaseline gauze would have counteracted benzoin and would not be conducive to adherence.  Stat portable chest xray obtained and not resulted.  Will continue to monitor patient.  Patient is not short of breath at this time. Lodema Pilot [facility]

## 2011-08-10 NOTE — Progress Notes (Addendum)
                    [address]            [name] [address]          [phone]          Subjective: No new complaints except chills.  Objective: Vital signs in last 24 hours: Patient Vitals for the past 24 hrs:  BP Temp Temp src Pulse Resp SpO2  08/10/11 0450 123/77 mmHg 101.7 F (38.7 C) Oral 120  17  90 %  08/10/11 0330 - 100 F (37.8 C) Oral - - -  08/10/11 0150 132/93 mmHg 101.4 F (38.6 C) Oral 120  - 93 %  08/09/11 1950 119/83 mmHg 99.4 F (37.4 C) Oral 115  17  90 %  08/09/11 1500 119/78 mmHg 97.1 F (36.2 C) Oral 116  16  91 %  08/09/11 1052 119/83 mmHg - - - - -   Current Weight  08/06/11 47.2 kg (104 lb 0.9 oz)     Intake/Output from previous day: 04/06 0701 - 04/07 0700 In: 320 [P.O.:320] Out: 520 [Urine:500; Chest Tube:20]    PHYSICAL EXAM:  Heart: RRR Lungs:clear Chest tube: no air leak  CXR:  1. Low lung volumes and bibasilar atelectasis.  2. Right chest tube in place without diagnostic pneumothorax  Assessment/Plan: Hopefully can d/c chest tube today. Fever to 101 this am.  Probably secondary to atelectasis. Denies cough. On Cipro D#3 for E coli UTI.  Will watch and check CBC. May need blood cultures if remains febrile.    LOS: 4 days    COLLINS,GINA H 08/10/2011  No air leak for 48 hours. Will d/c CT today. She knows there is a risk of recurrence. Unclear what source of fever is.

## 2011-08-10 NOTE — Progress Notes (Signed)
Patient complained of feeling hot and warm and was sweating.  Patient's temperature was assessed and was 101.4. MD notified and orders received for Tylenol.  Patient was given two tylenol and temperature was reassessed and was trending down at 100. Will continue to monitor.

## 2011-08-11 ENCOUNTER — Inpatient Hospital Stay (HOSPITAL_COMMUNITY): Payer: Medicare Other

## 2011-08-11 DIAGNOSIS — R6520 Severe sepsis without septic shock: Secondary | ICD-10-CM | POA: Diagnosis not present

## 2011-08-11 DIAGNOSIS — A419 Sepsis, unspecified organism: Secondary | ICD-10-CM | POA: Diagnosis not present

## 2011-08-11 DIAGNOSIS — J189 Pneumonia, unspecified organism: Secondary | ICD-10-CM | POA: Diagnosis not present

## 2011-08-11 DIAGNOSIS — J9383 Other pneumothorax: Principal | ICD-10-CM

## 2011-08-11 DIAGNOSIS — J9601 Acute respiratory failure with hypoxia: Secondary | ICD-10-CM | POA: Diagnosis not present

## 2011-08-11 DIAGNOSIS — N390 Urinary tract infection, site not specified: Secondary | ICD-10-CM

## 2011-08-11 DIAGNOSIS — J9600 Acute respiratory failure, unspecified whether with hypoxia or hypercapnia: Secondary | ICD-10-CM

## 2011-08-11 DIAGNOSIS — R0602 Shortness of breath: Secondary | ICD-10-CM

## 2011-08-11 LAB — BASIC METABOLIC PANEL
Calcium: 9.9 mg/dL (ref 8.4–10.5)
GFR calc Af Amer: >90 mL/min (ref >90)
GFR calc non Af Amer: >90 mL/min (ref >90)
Glucose, Bld: 119 mg/dL — ABNORMAL HIGH (ref 70–99)
Potassium: 4 mEq/L (ref 3.5–5.1)
Sodium: 133 mEq/L — ABNORMAL LOW (ref 135–145)

## 2011-08-11 LAB — POCT I-STAT 3, ART BLOOD GAS (G3+)
Acid-base deficit: 3 mmol/L — ABNORMAL HIGH (ref 0.0–2.0)
Bicarbonate: 23.8 mEq/L (ref 20.0–24.0)
Patient temperature: 98.6
TCO2: 25 mmol/L (ref 0–100)

## 2011-08-11 LAB — COMPREHENSIVE METABOLIC PANEL
Albumin: 2.7 g/dL — ABNORMAL LOW (ref 3.5–5.2)
BUN: 17 mg/dL (ref 6–23)
Calcium: 9.3 mg/dL (ref 8.4–10.5)
Chloride: 97 mEq/L (ref 96–112)
Creatinine, Ser: 0.79 mg/dL (ref 0.50–1.10)
Total Bilirubin: 0.6 mg/dL (ref 0.3–1.2)

## 2011-08-11 LAB — LACTIC ACID, PLASMA: Lactic Acid, Venous: 0.9 mmol/L (ref 0.5–2.2)

## 2011-08-11 LAB — CBC
HCT: 34.2 % — ABNORMAL LOW (ref 36.0–46.0)
Hemoglobin: 12 g/dL (ref 12.0–15.0)
MCH: 30.3 pg (ref 26.0–34.0)
MCH: 30.4 pg (ref 26.0–34.0)
MCHC: 33 g/dL (ref 30.0–36.0)
MCV: 91.9 fL (ref 78.0–100.0)
RBC: 3.96 MIL/uL (ref 3.87–5.11)
RDW: 14.5 % (ref 11.5–15.5)
WBC: 15.9 10*3/uL — ABNORMAL HIGH (ref 4.0–10.5)

## 2011-08-11 LAB — BLOOD GAS, ARTERIAL
Acid-base deficit: 0.9 mmol/L (ref 0.0–2.0)
Drawn by: 277551
FIO2: 100 %
pCO2 arterial: 38.6 mmHg (ref 35.0–45.0)
pH, Arterial: 7.396 (ref 7.350–7.400)
pO2, Arterial: 60.4 mmHg — ABNORMAL LOW (ref 80.0–100.0)

## 2011-08-11 LAB — CARDIAC PANEL(CRET KIN+CKTOT+MB+TROPI)
Relative Index: INVALID (ref 0.0–2.5)
Total CK: 27 U/L (ref 7–177)

## 2011-08-11 LAB — PROCALCITONIN: Procalcitonin: 2.09 ng/mL

## 2011-08-11 LAB — D-DIMER, QUANTITATIVE: D-Dimer, Quant: 2.09 ug/mL-FEU — ABNORMAL HIGH (ref 0.00–0.48)

## 2011-08-11 MED ORDER — PIPERACILLIN-TAZOBACTAM 3.375 G IVPB
3.3750 g | Freq: Three times a day (TID) | INTRAVENOUS | Status: DC
  Administered 2011-08-11: 3.375 g via INTRAVENOUS
  Filled 2011-08-11 (×3): qty 50

## 2011-08-11 MED ORDER — ETOMIDATE 2 MG/ML IV SOLN
20.0000 mg | Freq: Once | INTRAVENOUS | Status: AC
  Administered 2011-08-11: 20 mg via INTRAVENOUS

## 2011-08-11 MED ORDER — MORPHINE SULFATE 2 MG/ML IJ SOLN
2.0000 mg | INTRAMUSCULAR | Status: AC
  Administered 2011-08-11: 2 mg via INTRAVENOUS

## 2011-08-11 MED ORDER — MIDAZOLAM HCL 2 MG/2ML IJ SOLN
1.0000 mg | INTRAMUSCULAR | Status: DC | PRN
  Administered 2011-08-11 – 2011-08-12 (×2): 2 mg via INTRAVENOUS
  Filled 2011-08-11 (×2): qty 2

## 2011-08-11 MED ORDER — VANCOMYCIN HCL IN DEXTROSE 1-5 GM/200ML-% IV SOLN
1000.0000 mg | INTRAVENOUS | Status: DC
  Administered 2011-08-12: 1000 mg via INTRAVENOUS
  Filled 2011-08-11 (×2): qty 200

## 2011-08-11 MED ORDER — SODIUM CHLORIDE 0.9 % IV BOLUS (SEPSIS): 25.0000 mL/kg | Freq: Once | INTRAVENOUS | Status: DC

## 2011-08-11 MED ORDER — SODIUM CHLORIDE 0.9 % IV SOLN
50.0000 ug/h | INTRAVENOUS | Status: DC
  Administered 2011-08-11: 100 ug/h via INTRAVENOUS
  Filled 2011-08-11: qty 50

## 2011-08-11 MED ORDER — NOREPINEPHRINE BITARTRATE 1 MG/ML IJ SOLN
2.0000 ug/min | INTRAMUSCULAR | Status: DC | PRN
  Filled 2011-08-11: qty 4

## 2011-08-11 MED ORDER — ENOXAPARIN SODIUM 40 MG/0.4ML ~~LOC~~ SOLN
40.0000 mg | Freq: Every day | SUBCUTANEOUS | Status: DC
  Administered 2011-08-11: 40 mg via SUBCUTANEOUS
  Filled 2011-08-11 (×2): qty 0.4

## 2011-08-11 MED ORDER — PIPERACILLIN-TAZOBACTAM 3.375 G IVPB 30 MIN
3.3750 g | Freq: Once | INTRAVENOUS | Status: DC
  Filled 2011-08-11: qty 50

## 2011-08-11 MED ORDER — SODIUM CHLORIDE 0.9 % IV BOLUS (SEPSIS): 500.0000 mL | INTRAVENOUS | Status: DC | PRN

## 2011-08-11 MED ORDER — SODIUM CHLORIDE 0.9 % IV SOLN
INTRAVENOUS | Status: DC
  Administered 2011-08-12: via INTRAVENOUS

## 2011-08-11 MED ORDER — DOBUTAMINE IN D5W 4-5 MG/ML-% IV SOLN
2.5000 ug/kg/min | INTRAVENOUS | Status: DC | PRN
  Filled 2011-08-11: qty 250

## 2011-08-11 MED ORDER — MORPHINE SULFATE 2 MG/ML IJ SOLN
INTRAMUSCULAR | Status: AC
  Administered 2011-08-11: 2 mg via INTRAVENOUS
  Filled 2011-08-11: qty 1

## 2011-08-11 MED ORDER — PANTOPRAZOLE SODIUM 40 MG IV SOLR
40.0000 mg | INTRAVENOUS | Status: DC
  Administered 2011-08-11: 40 mg via INTRAVENOUS
  Filled 2011-08-11 (×2): qty 40

## 2011-08-11 MED ORDER — PIPERACILLIN-TAZOBACTAM 3.375 G IVPB
3.3750 g | Freq: Three times a day (TID) | INTRAVENOUS | Status: DC
  Administered 2011-08-11 – 2011-08-17 (×16): 3.375 g via INTRAVENOUS
  Filled 2011-08-11 (×18): qty 50

## 2011-08-11 MED ORDER — ALBUTEROL SULFATE HFA 108 (90 BASE) MCG/ACT IN AERS
6.0000 | INHALATION_SPRAY | RESPIRATORY_TRACT | Status: DC
  Administered 2011-08-12: 8 via RESPIRATORY_TRACT
  Administered 2011-08-12 (×4): 6 via RESPIRATORY_TRACT
  Filled 2011-08-11: qty 6.7

## 2011-08-11 MED ORDER — FENTANYL BOLUS VIA INFUSION
50.0000 ug | Freq: Four times a day (QID) | INTRAVENOUS | Status: DC | PRN
  Filled 2011-08-11: qty 100

## 2011-08-11 MED ORDER — SODIUM CHLORIDE 0.9 % IV SOLN
250.0000 mg | Freq: Two times a day (BID) | INTRAVENOUS | Status: DC
  Administered 2011-08-11 – 2011-08-13 (×4): 250 mg via INTRAVENOUS
  Filled 2011-08-11 (×5): qty 2.5

## 2011-08-11 MED ORDER — VANCOMYCIN HCL IN DEXTROSE 1-5 GM/200ML-% IV SOLN
1000.0000 mg | Freq: Once | INTRAVENOUS | Status: DC
  Filled 2011-08-11: qty 200

## 2011-08-11 MED ORDER — SODIUM CHLORIDE 0.9 % IV BOLUS (SEPSIS): 500.0000 mL | Freq: Once | INTRAVENOUS | Status: DC

## 2011-08-11 MED ORDER — VANCOMYCIN HCL IN DEXTROSE 1-5 GM/200ML-% IV SOLN
1000.0000 mg | INTRAVENOUS | Status: DC
  Administered 2011-08-11: 1000 mg via INTRAVENOUS
  Filled 2011-08-11: qty 200

## 2011-08-11 MED ORDER — OXYCODONE HCL 5 MG PO TABS: 5.0000 mg | ORAL_TABLET | ORAL | Status: DC | PRN

## 2011-08-11 MED ORDER — ALBUTEROL SULFATE HFA 108 (90 BASE) MCG/ACT IN AERS: 6.0000 | INHALATION_SPRAY | RESPIRATORY_TRACT | Status: DC | PRN

## 2011-08-11 MED ORDER — LEVALBUTEROL HCL 0.63 MG/3ML IN NEBU
0.6300 mg | INHALATION_SOLUTION | Freq: Three times a day (TID) | RESPIRATORY_TRACT | Status: DC
  Administered 2011-08-11: 0.63 mg via RESPIRATORY_TRACT
  Filled 2011-08-11 (×7): qty 3

## 2011-08-11 MED ORDER — ROCURONIUM BROMIDE 50 MG/5ML IV SOLN
50.0000 mg | Freq: Once | INTRAVENOUS | Status: AC
  Administered 2011-08-11: 50 mg via INTRAVENOUS
  Filled 2011-08-11: qty 5

## 2011-08-11 MED ORDER — LORAZEPAM 2 MG/ML IJ SOLN: 1.0000 mg | Freq: Once | INTRAMUSCULAR | Status: DC

## 2011-08-11 MED ORDER — LEVOFLOXACIN IN D5W 750 MG/150ML IV SOLN
750.0000 mg | INTRAVENOUS | Status: DC
  Filled 2011-08-11: qty 150

## 2011-08-11 NOTE — Progress Notes (Signed)
TCTS BRIEF PROGRESS NOTE   More sedated this afternoon.  Apparently received some Xanax.  O2 sats > 90% I would try to minimize sedation and narcotics now that chest tube is out. Otherwise will defer her management to the medical team.  Purcell Nails 08/11/2011 5:24 PM

## 2011-08-11 NOTE — Progress Notes (Signed)
Patient O2 saturation around 86 on 6L [place], notified respiratory therapy to assess.

## 2011-08-11 NOTE — Progress Notes (Signed)
Pt received to 2101 from 2000 via bed.

## 2011-08-11 NOTE — Progress Notes (Addendum)
Subjective:   Rhonda Ramos complains of pain and shortness of breath this morning.  She developed respiratory issues yesterday.  She had been stable on oxygen via nasal cannula running at 3L, she is currently on a non-re breather  Objective:  Vital Signs in the last 24 hours: Temp:  [99.8 F (37.7 C)-101.6 F (38.7 C)] 100.8 F (38.2 C) (04/08 0423) Pulse Rate:  [122-127] 126  (04/08 0423) Resp:  [17-18] 18  (04/08 0423) BP: (126-131)/(72-81) 126/72 mmHg (04/08 0423) SpO2:  [81 %-95 %] 95 % (04/08 0650)  Intake/Output from previous day: 04/07 0701 - 04/08 0700 In: 600 [P.O.:600] Out: 300 [Urine:300] Intake/Output from this shift:    Physical Exam: General appearance: alert and mild distress Lungs: clear to auscultation bilaterally Heart: regular rate and rhythm Abdomen: soft, non-tender; bowel sounds normal; no masses,  no organomegaly Skin: chest tube site erythematous no purulent drainage present, wound is open  Lab Results:  Basename 08/10/11 1124  WBC 16.2*  HGB 13.0  PLT 331   No results found for this basename: NA:2,K:2,CL:2,CO2:2,GLUCOSE:2,BUN:2,CREATININE:2 in the last 72 hours No results found for this basename: TROPONINI:2,CK,MB:2 in the last 72 hours Hepatic Function Panel No results found for this basename: PROT,ALBUMIN,AST,ALT,ALKPHOS,BILITOT,BILIDIR,IBILI in the last 72 hours No results found for this basename: CHOL in the last 72 hours No results found for this basename: PROTIME in the last 72 hours  Imaging: Imaging results have been reviewed: no evidence of pneumothorax post chest tube pull, there is a possible RLL infection vs atelectasis  Assessment/Plan:   1. Spontaneous Pneumothorax- S/P chest tube placement 2. Febrile- patient remains febrile this morning, will get CBC and blood cultures.  Per CXR possible RLL infiltrate  3. UTI- culture positive for E.coli- patient on Cipro 4. Resp- patient feels dyspneic- currently on non-re breather with  saturations in the mid 90s.  She may benefit from breathing treatments for atelectasis on CXR 5. Dispo- need to rule out possible bacteremia as source of infection, improve respiratory status prior to d/c    LOS: 5 days    BARRETT, Rhonda Ramos 08/11/2011, 8:08 AM    I have seen and examined the patient and agree with the assessment and plan as outlined.  Spontaneous pneumothorax has resolved.  Will consult Hospitalist Team to take over chronic medical issues.  Rhonda Ramos H 08/11/2011 9:07 AM
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
## 2011-08-11 NOTE — Progress Notes (Signed)
Pharmacy asked to dose lovenox for DVT prophylaxis. Weight 47.2kg and CrCl ~ 9ml/min. Will dose lovenox 40mg  sq q24h and follow renal function for dose adjustments.  Gentry Roch Clinical Pharmacist 08/11/11, [phone]

## 2011-08-11 NOTE — Progress Notes (Signed)
Respiratory came to assess patient and applied a non-rebreather mask. Patient's O2 saturation came up to 95. Will continue to monitor.
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
## 2011-08-11 NOTE — Progress Notes (Signed)
Patient is extremely anxious and states she is afraid she is not going to make it out of here. Having to constantly remind her to leave her oxygen on because she desats.  She is picking and scratching at her face. Patient given Xanax and comforted. Will continue to monitor.

## 2011-08-11 NOTE — Procedures (Signed)
Name:  Rhonda Ramos MRN:  [id number] DOB:  Dec 19, 1965  PROCEDURE NOTE  Procedure:  Ultrasound-guided central venous catheter placement.  Indications:  Need for intravenous access and hemodynamic monitoring.  Consent:  Consent was obtained from her sister.  Anesthesia:  A total of 10 mL of 1% Lidocaine was used for local infiltration anesthesia.  Procedure summary:  Appropriate equipment was assembled.  The patient was identified as [name] and safety timeout was performed. The patient was placed in Trendelenburg position.  Sterile technique was used. The patient's right neck region was prepped using chlorhexidine / alcohol scrub and the field was draped in usual sterile fashion with full body drape. The right internal jugular vein and the right carotid artery were identified by ultrasound, the patency was evaluated and images were documented. After the adequate anesthesia was achieved, the vein was cannulated with the introducer needle under sonographic guidance without difficulty. A guide wire was advanced through the introducer needle, which was then withdrawn. A small skin incision was made at the point of wire entry, the dilator was inserted over the guide wire and appropriate dilation was obtained. The dilator was removed and triple-lumen catheter was advanced over the guide wire, which was then removed.  All ports were aspirated and flushed with normal saline without difficulty. The catheter was secured into place with sutures at 15 cm. Antibiotic patch was placed and sterile dressing was applied. Post-procedure chest x-ray was ordered.  Complications:  No immediate complications were noted.  Hemodynamic parameters and oxygenation remained stable throughout the procedure.  Estimated blood loss:  Less then 5 mL.  Dr. Kendrick Fries was present throughout the procedure for supervised.  Lars Mage MD [facility] Resident Pager [phone]   08/11/2011, 8:50 PM  I performed the  procedure with the resident.  Yolonda Kida PCCM Pager: [phone] If no response, call [phone]

## 2011-08-11 NOTE — Procedures (Signed)
Intubation Procedure Note ZORA GLENDENNING [id number] [birth year]  Procedure: Intubation Indications: Respiratory insufficiency  Procedure Details Consent: Risks of procedure as well as the alternatives and risks of each were explained to the (patient/caregiver).  Consent for procedure obtained. Time Out: Verified patient identification, verified procedure, site/side was marked, verified correct patient position, special equipment/implants available, medications/allergies/relevent history reviewed, required imaging and test results available.  Performed  Drugs: Etomidate 20mg  IV, Rocuronium 50mg  IV DL x1 Grade 1 view, 7.5 ETT passed easily through cords under direct visualization, confirmed with end tidal CO2 monitor, vapor in tube, bilateral breath sounds. MAC and 3   Evaluation Hemodynamic Status: BP stable throughout; O2 sats: transiently fell during during procedure Patient's Current Condition: stable Complications: No apparent complications Patient did tolerate procedure well. Chest X-ray ordered to verify placement.  CXR: pending.   Jarely Juncaj 08/11/2011

## 2011-08-11 NOTE — Progress Notes (Signed)
   CARE MANAGEMENT NOTE 08/11/2011  Patient:  Rhonda Ramos, Rhonda Ramos   Account Number:  [id number]  Date Initiated:  08/11/2011  Documentation initiated by:  Jan Walters  Subjective/Objective Assessment:   PT ADM WITH SPONTANEOUS PNEUMOTHORAX REQUIRING CHEST TUBE PLACEMENT.     Action/Plan:   PTA, PT LIVES ALONE, BUT HAS SUPPORTIVE FAMILY; WAS DISCHARGED TO MOTHER'S HOME AFTER LAST HOSPITALIZATION, AND HAD HOME O2 SET UP WITH [facility].   Anticipated DC Date:  08/15/2011   Anticipated DC Plan:  HOME W HOME HEALTH SERVICES      DC Planning Services  CM consult      Choice offered to / List presented to:             Status of service:  In process, will continue to follow Medicare Important Message given?   (If response is "NO", the following Medicare IM given date fields will be blank) Date Medicare IM given:   Date Additional Medicare IM given:    Discharge Disposition:    Per UR Regulation:    If discussed at Long Length of Stay Meetings, dates discussed:    Comments:    Phone #[phone]

## 2011-08-11 NOTE — Progress Notes (Signed)
ANTIBIOTIC CONSULT NOTE - INITIAL  Pharmacy Consult for vancomycin/zosyn/Levaquin Indication: Pneumonia  Allergies  Allergen Reactions  . Latex Shortness Of Breath  . Codeine Nausea And Vomiting  . Erythromycin Nausea And Vomiting    Patient Measurements: Height: 4\' 9"  (144.8 cm) Weight: 104 lb 0.9 oz (47.2 kg) IBW/kg (Calculated) : 38.6  Adjusted Body Weight: 47kg Vital Signs: Temp: 100.8 F (38.2 C) (04/08 0423) Temp src: Oral (04/08 0423) BP: 126/72 mmHg (04/08 0423) Pulse Rate: 126  (04/08 0423) Intake/Output from previous day: 04/07 0701 - 04/08 0700 In: 600 [P.O.:600] Out: 300 [Urine:300] Intake/Output from this shift:    Labs:  Basename 08/11/11 0935 08/10/11 1124  WBC 15.9* 16.2*  HGB 12.0 13.0  PLT 304 331  LABCREA -- --  CREATININE -- --   Estimated Creatinine Clearance: 59.5 ml/min (by C-G formula based on Cr of 0.75). No results found for this basename: VANCOTROUGH:2,VANCOPEAK:2,VANCORANDOM:2,GENTTROUGH:2,GENTPEAK:2,GENTRANDOM:2,TOBRATROUGH:2,TOBRAPEAK:2,TOBRARND:2,AMIKACINPEAK:2,AMIKACINTROU:2,AMIKACIN:2, in the last 72 hours   Microbiology: Recent Results (from the past 720 hour(s))  URINE CULTURE     Status: Normal   Collection Time   08/06/11 10:54 PM      Component Value Range Status Comment   Specimen Description URINE, CATHETERIZED   Final    Special Requests Normal   Final    Culture  Setup Time [id number]   Final    Colony Count >=100,000 COLONIES/ML   Final    Culture ESCHERICHIA COLI   Final    Report Status 08/08/2011 FINAL   Final    Organism ID, Bacteria ESCHERICHIA COLI   Final   MRSA PCR SCREENING     Status: Abnormal   Collection Time   08/07/11  9:07 AM      Component Value Range Status Comment   MRSA by PCR POSITIVE (*) NEGATIVE  Final     Medical History: Past Medical History  Diagnosis Date  . Chronic pain syndrome     s/p rehab - narc detox fall 2011  . SPINA BIFIDA   . Scoliosis     multiple surg for same  .  DEPRESSION     > ptsd (spouse suicide/gsw 11/2005  . GERD   . HYPERTENSION   . Anxiety   . Pneumothorax on right 04/03/11, 06/10/11    recurrent s/p VATS 2/13  . Seizures     onset fall 2011 with narc detox  . Chronic back pain greater than 3 months duration   . 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 Urinary tract infection 08/08/2011    E. coli    Medications:  Anti-infectives     Start     Dose/Rate Route Frequency Ordered Stop   08/08/11 1430   ciprofloxacin (CIPRO) tablet 500 mg  Status:  Discontinued        500 mg Oral 2 times daily 08/08/11 1345 08/11/11 1344         Assessment: 46 year old female admitted on 08/06/11 for shortness of breath, increased pain and generally feeling "different". Patient is now on on hospital day #5, s/p chest tube placement for pneumothorax, started on cipro on 4/5 for e.coli uti. Antibiotics are being broadened for possible pneumonia. Fever noted of 101.6 and wbc steadily increasing to peak of 16.2>>15.9 today. To start vancomycin and zosyn and change cipro to levaquin to cover likely hospital acquired pathogens.  Goal of Therapy:  Vancomycin trough level 15-20 mcg/ml  Plan:  Vancomycin 1000mg  IV q 24 hours Zosyn 3.375g IV q  8 hours - infuse over 4 hours Levaquin 750mg  IV q 24 hours Follow renal function and pending blood cultures  Severiano Gilbert 08/11/2011,2:06 PM

## 2011-08-12 ENCOUNTER — Inpatient Hospital Stay (HOSPITAL_COMMUNITY): Payer: Medicare Other

## 2011-08-12 DIAGNOSIS — J9600 Acute respiratory failure, unspecified whether with hypoxia or hypercapnia: Secondary | ICD-10-CM

## 2011-08-12 DIAGNOSIS — J984 Other disorders of lung: Secondary | ICD-10-CM

## 2011-08-12 LAB — POCT I-STAT 3, ART BLOOD GAS (G3+)
Acid-Base Excess: 1 mmol/L (ref 0.0–2.0)
Acid-base deficit: 3 mmol/L — ABNORMAL HIGH (ref 0.0–2.0)
Bicarbonate: 22.7 mEq/L (ref 20.0–24.0)
Bicarbonate: 23.5 mEq/L (ref 20.0–24.0)
Bicarbonate: 23.5 mEq/L (ref 20.0–24.0)
O2 Saturation: 100 %
O2 Saturation: 97 %
O2 Saturation: 94 %
Patient temperature: 102
Patient temperature: 101
Patient temperature: 98.3
TCO2: 25 mmol/L (ref 0–100)
pCO2 arterial: 68.4 mmHg (ref 35.0–45.0)
pCO2 arterial: 62.9 mmHg (ref 35.0–45.0)
pCO2 arterial: 54.4 mmHg — ABNORMAL HIGH (ref 35.0–45.0)
pCO2 arterial: 43.3 mmHg (ref 35.0–45.0)
pH, Arterial: 7.303 — ABNORMAL LOW (ref 7.350–7.400)
pH, Arterial: 7.192 — CL (ref 7.350–7.400)
pH, Arterial: 7.204 — ABNORMAL LOW (ref 7.350–7.400)
pO2, Arterial: 64 mmHg — ABNORMAL LOW (ref 80.0–100.0)
pO2, Arterial: 274 mmHg — ABNORMAL HIGH (ref 80.0–100.0)
pO2, Arterial: 137 mmHg — ABNORMAL HIGH (ref 80.0–100.0)

## 2011-08-12 LAB — BASIC METABOLIC PANEL
BUN: 16 mg/dL (ref 6–23)
Calcium: 9 mg/dL (ref 8.4–10.5)
Creatinine, Ser: 0.73 mg/dL (ref 0.50–1.10)
GFR calc Af Amer: >90 mL/min (ref >90)
GFR calc non Af Amer: >90 mL/min (ref >90)
Glucose, Bld: 121 mg/dL — ABNORMAL HIGH (ref 70–99)
Potassium: 3.8 mEq/L (ref 3.5–5.1)

## 2011-08-12 LAB — CARBOXYHEMOGLOBIN: Methemoglobin: 0.6 % (ref 0.0–1.5)

## 2011-08-12 LAB — CBC
HCT: 32.4 % — ABNORMAL LOW (ref 36.0–46.0)
Hemoglobin: 10.7 g/dL — ABNORMAL LOW (ref 12.0–15.0)
MCH: 30.3 pg (ref 26.0–34.0)
MCHC: 33 g/dL (ref 30.0–36.0)
MCV: 91.8 fL (ref 78.0–100.0)
RDW: 14.6 % (ref 11.5–15.5)

## 2011-08-12 MED ORDER — FENTANYL BOLUS VIA INFUSION
50.0000 ug | Freq: Four times a day (QID) | INTRAVENOUS | Status: DC | PRN
  Administered 2011-08-14: 25 ug via INTRAVENOUS
  Administered 2011-08-14: 75 ug via INTRAVENOUS
  Administered 2011-08-14: 50 ug via INTRAVENOUS
  Filled 2011-08-12: qty 100

## 2011-08-12 MED ORDER — HEPARIN SODIUM (PORCINE) 5000 UNIT/ML IJ SOLN
5000.0000 [IU] | Freq: Three times a day (TID) | INTRAMUSCULAR | Status: DC
  Administered 2011-08-12 – 2011-08-22 (×30): 5000 [IU] via SUBCUTANEOUS
  Filled 2011-08-12 (×35): qty 1

## 2011-08-12 MED ORDER — VECURONIUM BROMIDE 10 MG IV SOLR
INTRAVENOUS | Status: AC
  Administered 2011-08-12: 6 mg via INTRAVENOUS
  Filled 2011-08-12: qty 10

## 2011-08-12 MED ORDER — VECURONIUM BROMIDE 10 MG IV SOLR
6.0000 mg | Freq: Once | INTRAVENOUS | Status: AC
  Administered 2011-08-12: 6 mg via INTRAVENOUS

## 2011-08-12 MED ORDER — SODIUM CHLORIDE 0.9 % IV SOLN
3.0000 ug/kg/min | INTRAVENOUS | Status: DC
  Administered 2011-08-12: 3 ug/kg/min via INTRAVENOUS
  Administered 2011-08-13: 2 ug/kg/min via INTRAVENOUS
  Filled 2011-08-12 (×2): qty 20

## 2011-08-12 MED ORDER — MIDAZOLAM BOLUS VIA INFUSION
1.0000 mg | INTRAVENOUS | Status: DC | PRN
  Administered 2011-08-14: 2 mg via INTRAVENOUS
  Filled 2011-08-12: qty 2

## 2011-08-12 MED ORDER — DOCUSATE SODIUM 50 MG/5ML PO LIQD
100.0000 mg | Freq: Two times a day (BID) | ORAL | Status: DC
  Administered 2011-08-12 – 2011-08-15 (×9): 100 mg
  Filled 2011-08-12 (×9): qty 10

## 2011-08-12 MED ORDER — VECURONIUM BOLUS VIA INFUSION
6.0000 mg | Freq: Once | INTRAVENOUS | Status: DC
  Filled 2011-08-12: qty 6

## 2011-08-12 MED ORDER — DEXTROSE 5 % IV SOLN
INTRAVENOUS | Status: DC
  Administered 2011-08-12: 15:00:00 via INTRAVENOUS
  Filled 2011-08-12 (×4): qty 100

## 2011-08-12 MED ORDER — ARTIFICIAL TEARS OP OINT
1.0000 "application " | TOPICAL_OINTMENT | Freq: Three times a day (TID) | OPHTHALMIC | Status: DC
  Administered 2011-08-12 – 2011-08-14 (×6): 1 via OPHTHALMIC
  Filled 2011-08-12 (×2): qty 3.5

## 2011-08-12 MED ORDER — CISATRACURIUM BOLUS VIA INFUSION
0.1000 mg/kg | Freq: Once | INTRAVENOUS | Status: DC
  Filled 2011-08-12: qty 5

## 2011-08-12 MED ORDER — SODIUM CHLORIDE 0.9 % IV SOLN
2.0000 mg/h | INTRAVENOUS | Status: DC
  Administered 2011-08-12: 4 mg/h via INTRAVENOUS
  Administered 2011-08-12: 2 mg/h via INTRAVENOUS
  Administered 2011-08-12: 4 mg/h via INTRAVENOUS
  Administered 2011-08-13 – 2011-08-14 (×3): 3 mg/h via INTRAVENOUS
  Filled 2011-08-12 (×7): qty 10

## 2011-08-12 MED ORDER — SODIUM CHLORIDE 0.9 % IV SOLN
50.0000 ug/h | INTRAVENOUS | Status: DC
  Administered 2011-08-12: 250 ug/h via INTRAVENOUS
  Administered 2011-08-12: 175 ug/h via INTRAVENOUS
  Administered 2011-08-13 – 2011-08-14 (×3): 150 ug/h via INTRAVENOUS
  Administered 2011-08-14: 250 ug/h via INTRAVENOUS
  Administered 2011-08-15: 300 ug/h via INTRAVENOUS
  Filled 2011-08-12 (×5): qty 50

## 2011-08-12 MED ORDER — CHLORHEXIDINE GLUCONATE CLOTH 2 % EX PADS
6.0000 | MEDICATED_PAD | Freq: Every day | CUTANEOUS | Status: AC
  Administered 2011-08-12 – 2011-08-17 (×5): 6 via TOPICAL

## 2011-08-12 MED ORDER — CHLORHEXIDINE GLUCONATE 0.12 % MT SOLN
15.0000 mL | Freq: Two times a day (BID) | OROMUCOSAL | Status: DC
  Administered 2011-08-12 – 2011-08-16 (×9): 15 mL via OROMUCOSAL
  Filled 2011-08-12 (×9): qty 15

## 2011-08-12 MED ORDER — PANTOPRAZOLE SODIUM 40 MG PO PACK
40.0000 mg | PACK | Freq: Every day | ORAL | Status: DC
  Administered 2011-08-12 – 2011-08-14 (×3): 40 mg
  Filled 2011-08-12 (×5): qty 20

## 2011-08-12 MED ORDER — LEVALBUTEROL TARTRATE 45 MCG/ACT IN AERO
4.0000 | INHALATION_SPRAY | Freq: Three times a day (TID) | RESPIRATORY_TRACT | Status: DC
  Administered 2011-08-12 – 2011-08-15 (×7): 4 via RESPIRATORY_TRACT
  Administered 2011-08-15: 2 via RESPIRATORY_TRACT
  Filled 2011-08-12: qty 15

## 2011-08-12 MED ORDER — MUPIROCIN 2 % EX OINT
1.0000 "application " | TOPICAL_OINTMENT | Freq: Two times a day (BID) | CUTANEOUS | Status: AC
  Administered 2011-08-13 – 2011-08-17 (×10): 1 via NASAL
  Filled 2011-08-12: qty 22

## 2011-08-12 MED ORDER — BIOTENE DRY MOUTH MT LIQD
15.0000 mL | Freq: Four times a day (QID) | OROMUCOSAL | Status: DC
  Administered 2011-08-12 – 2011-08-22 (×34): 15 mL via OROMUCOSAL

## 2011-08-12 NOTE — Progress Notes (Signed)
Name: Rhonda Ramos DOB: [birth year] MRN: [id number] PCP: Rene Paci, MD, MD ADMIT DATE: 08/06/2011 LOS: 6  PCCM PROGRESS NOTE  Brief Patient Profile: Patient is a 46yo F with h/o chronic pain syndrome related to spina bifida & scoliosis admitted on 08/06/11 to [facility] for increasing SOB. In Feb 2013, she underwent VATS for stapling of blebs with mech pleurodesis by Dr. Tyrone Sage. Patient was found to have right spontaneous pneumothorax and a right sided chest tube was placed by Dr. Sharlene Motts on 4/3. Patient had urinary tract infection diagnosed on 4/5 and was started on Cipro. Patient started having fever and chills starting yesterday (4/7). Fever was as high as 101 F on 4/7. A CXR was obtained which showed bibasilar atelactasis on 4/7. Chest tube was taken out on 4/7. On 4/8 patient continued to have fever along with developing shortness of breath for which she was placed on non rebreather and ABG was obtained. PCCM was consulted when patient continued to do worse on non-rebreather and tranferred to ICU.  Best Practice: DVT: Lovenox SUP: Protonix Nutrition: NPO Glycemic control: None Sedation/analgesia: continuous fentanyl with prn versed  Lines / Drains: Right IJ 4/8>>>  ETT 4/8>>>  Micro:  Lab 08/11/11 2129  LATICACIDVEN 0.9   Recent Results (from the past 240 hour(s))  URINE CULTURE     Status: Normal   Collection Time   08/06/11 10:54 PM      Component Value Range Status Comment   Specimen Description URINE, CATHETERIZED   Final    Special Requests Normal   Final    Culture  Setup Time [id number]   Final    Colony Count >=100,000 COLONIES/ML   Final    Culture ESCHERICHIA COLI   Final    Report Status 08/08/2011 FINAL   Final    Organism ID, Bacteria ESCHERICHIA COLI   Final   MRSA PCR SCREENING     Status: Abnormal   Collection Time   08/07/11  9:07 AM      Component Value Range Status Comment   MRSA by PCR POSITIVE (*) NEGATIVE  Final    Resp culture 4/8>>>  Urine culture  4/8>>>  Blood culture 4/8>>>   Antibiotics: Anti-infectives     Start     Dose/Rate Route Frequency Ordered Stop   08/12/11 1600   vancomycin (VANCOCIN) IVPB 1000 mg/200 mL premix        1,000 mg 200 mL/hr over 60 Minutes Intravenous Every 24 hours 08/11/11 2107     08/12/11 0000  piperacillin-tazobactam (ZOSYN) IVPB 3.375 g       3.375 g 12.5 mL/hr over 240 Minutes Intravenous 3 times per day 08/11/11 2107     08/11/11 2200   levofloxacin (LEVAQUIN) IVPB 750 mg  Status:  Discontinued        750 mg 100 mL/hr over 90 Minutes Intravenous Every 24 hours 08/11/11 1420 08/11/11 2048   08/11/11 2100   piperacillin-tazobactam (ZOSYN) IVPB 3.375 g  Status:  Discontinued        3.375 g 100 mL/hr over 30 Minutes Intravenous  Once 08/11/11 2048 08/11/11 2103   08/11/11 2100   vancomycin (VANCOCIN) IVPB 1000 mg/200 mL premix  Status:  Discontinued        1,000 mg 200 mL/hr over 60 Minutes Intravenous  Once 08/11/11 2048 08/11/11 2049   08/11/11 1600   piperacillin-tazobactam (ZOSYN) IVPB 3.375 g  Status:  Discontinued        3.375 g 12.5 mL/hr over 240 Minutes Intravenous  Every 8 hours 08/11/11 1420 08/11/11 2048   08/11/11 1500   vancomycin (VANCOCIN) IVPB 1000 mg/200 mL premix  Status:  Discontinued        1,000 mg 200 mL/hr over 60 Minutes Intravenous Every 24 hours 08/11/11 1420 08/11/11 2048   08/08/11 1430   ciprofloxacin (CIPRO) tablet 500 mg  Status:  Discontinued        500 mg Oral 2 times daily 08/08/11 1345 08/11/11 1344          Tests / Events: Chest CT 4/3>>new pneumothrax  4/7 CT out CXR 4/8>>right lower lobe opacity  Consults: CTCS, TRH   Subjective/Overnight/Interval History: Did well overnight.  No acute events.     Vital Signs: Temp:  [98.9 F (37.2 C)-101 F (38.3 C)] 98.9 F (37.2 C) (04/09 0334) Pulse Rate:  [110-142] 112  (04/09 0600) Resp:  [15-27] 16  (04/09 0600) BP: (107-166)/(60-94) 113/60 mmHg (04/09 0600) SpO2:  [89 %-100 %] 93 % (04/09  0600) FiO2 (%):  [100 %] 100 % (04/09 0600) Weight:  [107 lb 2.3 oz (48.6 kg)] 107 lb 2.3 oz (48.6 kg) (04/09 0500) I/O last 3 completed shifts: In: 840 [P.O.:840] Out: 300 [Urine:300]  Physical Examination: Last febrile at 1440 on 4/8 General: no acute distress Neuro:  Awake, follows commands, +corneal and pupillary reflex HEENT: Intubated   Cardiovascular:  tachycardic Lungs:   improved aeration b/l, Breath sounds on R < L Abdomen:  Soft, NT/ND, normoactive BS   Ventilator settings: Vent Mode:  [-] PRVC FiO2 (%):  [100 %] 100 % Set Rate:  [18 bmp] 18 bmp Vt Set:  [450 mL] 450 mL PEEP:  [5 cmH20] 5 cmH20 Plateau Pressure:  [22 cmH20-26 cmH20] 22 cmH20  Labs/Radiology: Please see A and P for full labs  Dg Chest Port 1 View  08/12/2011  *RADIOLOGY REPORT*  Clinical Data: Check endotracheal tube placement.  PORTABLE CHEST - 1 VIEW  Comparison: Chest radiograph performed earlier today at 10:03 p.m.  Findings: The patient's endotracheal tube is seen ending approximately 2 cm above the carina.  The lungs remain hypoexpanded, though slightly improved from the prior study.  A moderate right-sided pleural effusion is noted. Underlying persistent bilateral airspace disease may reflect pulmonary edema or multifocal pneumonia.  No definite pneumothorax is seen.  The cardiomediastinal silhouette remains normal in size.  The patient's enteric tube is seen correlate within the stomach and ending at the fundus of the stomach.  A right IJ line is noted ending about the mid to distal SVC.  No acute osseous abnormalities are seen.  IMPRESSION:  1.  Endotracheal tube seen ending 2 cm above the carina. 2.  Lungs remain hypoexpanded; moderate right-sided pleural effusion.  Underlying persistent bilateral airspace disease may reflect pulmonary edema or multifocal pneumonia.  These results were called by telephone on 08/12/2011  at  12:58 a.m. to  Nursing on [id number], who verbally acknowledged these results.   Original Report Authenticated By: Tonia Ghent, M.D.   Dg Chest Port 1 View  08/11/2011  *RADIOLOGY REPORT*  Clinical Data: Endotracheal tube placement.  PORTABLE CHEST - 1 VIEW  Comparison: 08/11/2011.  Findings: Endotracheal tube tip at the level of the carina directed towards the right mainstem bronchus.  This needs to be retracted by 3 cm.  Results discussed with Dr. Kendrick Fries.  Right central line tip mid superior vena cava level.  Evaluation limited secondary to the pulmonary parenchymal changes.  No gross pneumothorax.  Nasogastric tube curled within the gastric  fundus region.  IMPRESSION: Endotracheal tube tip about to enter the right mainstem bronchus. Recommend retracted by 3 cm.  Progressive air space disease right lung.  Original Report Authenticated By: Fuller Canada, M.D.   Portable Chest Xray  08/11/2011  *RADIOLOGY REPORT*  Clinical Data: Intubation  PORTABLE CHEST - 1 VIEW  Comparison: 08/11/2011  Findings: Right mainstem intubation evident.  This can be retracted 5.5 cm.  Right IJ central line in the SVC.  Low lung volumes persist with bibasilar atelectasis and airspace disease.  Very minimal improvement in aeration.  Small right effusion not excluded.  No pneumothorax.  IMPRESSION: Right  main stem intubation, retract 5.5 cm  Right IJ central line in the SVC  Slight improvement diffuse airspace disease atelectasis pattern.  No pneumothorax  Critical Value/emergent results were called by telephone at the time of interpretation on 08/11/2011  at 9:30 p.m.  to  [facility], RN, who verbally acknowledged these results.  Original Report Authenticated By: Judie Petit. Ruel Favors, M.D.   Dg Chest Port 1 View  08/11/2011  *RADIOLOGY REPORT*  Clinical Data: Shortness of breath  PORTABLE CHEST - 1 VIEW  Comparison: 08/11/2011  Findings: Persistent low lung volumes with slight improvement in perihilar and lower lobe atelectasis/airspace process.  Pneumonia not excluded.  Stable right effusion.  No  pneumothorax.  Scoliosis of the spine.  IMPRESSION: Slight improvement in bibasilar atelectasis/airspace process. Pneumonia not excluded.  Stable right effusion  No significant or enlarging pneumothorax  Original Report Authenticated By: Judie Petit. Ruel Favors, M.D.   Dg Chest Port 1 View  08/11/2011  *RADIOLOGY REPORT*  Clinical Data: Possible right-sided pneumothorax.  Low oxygen saturation.  PORTABLE CHEST - 1 VIEW  Comparison: 1 day prior  Findings: Improved inspiratory effort.  Stable heart size and mediastinal contours.  Similar to slight decrease in small right pleural effusion. No pneumothorax.  Apparent lucency at the right lung apex is favored to be due to artifact from adjacent scapula.  Persistent low lung volumes.  Slight increase in right base air space disease with similar left base atelectasis.  IMPRESSION:  1. No pneumothorax. 2.  Probable artifact projecting over the right apex secondary to adjacent scapular shadow. 3. Improved inspiratory effort with persistent left base atelectasis and right pleural effusion. 4.  Increased right base air space disease.  Atelectasis versus developing infection.  Original Report Authenticated By: Consuello Bossier, M.D.   Dg Chest Port 1 View  08/10/2011  *RADIOLOGY REPORT*  Clinical Data: Chest pain and shortness of breath.  Right chest tube removed.  PORTABLE CHEST - 1 VIEW  Comparison: 08/10/2011 at 7:34 a.m.  Findings: Right chest tube has been removed.  No visible pneumothorax.  Slight increased atelectasis at the right base. Improved atelectasis at the left base.  Heart size and vascularity are normal.  IMPRESSION: No pneumothorax after chest tube removal.  Increased atelectasis at the right base.  Improved aeration at the left base.  Original Report Authenticated By: Gwynn Burly, M.D.   Dg Chest Port 1 View  08/10/2011  *RADIOLOGY REPORT*  Clinical Data: Evaluate right pneumothorax  PORTABLE CHEST - 1 VIEW  Comparison: 08/09/2011  Findings:  There is a  right-sided chest tube in place.  No pneumothorax identified.  The heart size appears normal.  There are low lung volumes and bibasilar atelectasis, unchanged from previous exam.  IMPRESSION:  1.  Low lung volumes and bibasilar atelectasis. 2.  Right chest tube in place without diagnostic pneumothorax.  Original Report  Authenticated By: Rosealee Albee, M.D.   Dg Abd Portable 1v  08/11/2011  *RADIOLOGY REPORT*  Clinical Data: Spina bifida, NG tube placement, abdominal distention  PORTABLE ABDOMEN - 1 VIEW  Comparison: 04/04/2011  Findings: NG tube coiled in the stomach proximally.  Limited exam because of portable technique and motion artifact.  Gaseous distention noted in the left upper quadrant either in the stomach or the colon.  Spina bifida noted of the lower lumbar spine with an associated scoliosis.  Atelectasis or airspace disease in the right lower lobe.  IMPRESSION: NG tube proximal stomach.  Limited exam.  Original Report Authenticated By: Judie Petit. Ruel Favors, M.D.    ASSESSMENT AND PLAN  RESPIRATORY  Lab 08/12/11 0408 08/12/11 0033 08/11/11 2146 08/11/11 1425  PHART 7.303* -- 7.319* 7.396  PCO2ART 45.9* -- 46.2* 38.6  PO2ART 64.0* -- 68.0* 60.4*  HCO3 22.7 -- 23.8 23.2  O2SAT 90.0 78.2 91.0 90.3   A:  Acute hypoxemic resp failure/ARDS d/t HCAP.  Intubated on 4/8. On low tidal volume, seems to be breath stacking/air hungry. P: Full mech support with ARDS protocol; paralyze with nimbex (neuromuscular blockade protocol), with ABG 30 min after  CARDIAC  Lab 08/11/11 2129  TROPONINI <0.30  PROBNP --   A: Patient tachycardic this morning, likely d/t PNA, CVP remained >10 overnight, CE (-) as above P: Continue to monitor  NEUROLOGIC A:  Awake & followed commands this morning & did well with continuous fentanyl overnight; now (12:14 PM) she hasn't been doing well after vent changes for ARDS protocol so is paralyzed & sedated P: Continue to monitor on paralytic as  above  VASCULAR/HEMODYNAMIC A:  BP stable with CVP >10 P: Continue to monitor and cont IVF NS @ 100cc/h  INFECTIOUS DISEASE  Lab 08/12/11 0421 08/11/11 2129 08/11/11 0935 08/10/11 1124 08/07/11 0545  WBC 13.1* 16.3* 15.9* 16.2* 10.1  PROCALCITON -- 2.09 -- -- --   A:  WBC trending down; infectious etiology likely multifocal HCAP PNA given last night's CXR, AM CXR report pending P: Continue vanc/zosyn, goal to stop abx on 4/17  RENAL  Lab 08/12/11 0421 08/11/11 2129 08/11/11 1502 08/07/11 0545 08/06/11 1230  BUN 16 17 19 15 12   CREATININE 0.73 0.79 0.78 0.75 0.80   I/O last 3 completed shifts: In: 840 [P.O.:840] Out: 300 [Urine:300] A:  No acute issues. P: Continue to monitor  ELECTROLYTES  Lab 08/12/11 0421 08/11/11 2129 08/11/11 1502 08/07/11 0545 08/06/11 1230  NA 133* 131* 133* 137 138  K 3.8 3.9 -- -- --  CL 100 97 100 102 103  CO2 23 23 23 26 25   BUN 16 17 19 15 12   CREATININE 0.73 0.79 0.78 0.75 0.80  CALCIUM 9.0 9.3 9.9 10.0 10.3  MG 1.8 -- -- -- --  PHOS -- -- -- -- --   A: mildly hyponatremic, possibly d/t decreased PO intake vs dilutional P: cont IVF and monitor  HEMATOLOGIC  Lab 08/12/11 0421 08/11/11 2129 08/11/11 0935 08/10/11 1124 08/07/11 0545 08/06/11 1230  HGB 10.7* 11.3* 12.0 13.0 13.3 --  HCT 32.4* 34.2* 36.4 38.9 39.3 --  PLT 278 283 304 331 328 --  INR -- -- -- -- -- 0.99  APTT -- -- -- -- -- 28   A:  Anemia of critical illness P:PRBC for hgb < 7gm% unless actively bleeding or in MI GASTROINTESTINAL  Lab 08/11/11 2129 08/06/11 1230  AST 21 16  ALT 11 11  ALKPHOS 85 119*  BILITOT 0.6  0.6  PROT 6.6 7.9  ALBUMIN 2.7* 4.2   A:  Hypoalbuminemic, likely d/t chronic disease and probable decreased PO intake P: NPO, will consider tube feeds when patient stabilizes  ENDOCRINE  Lab 08/11/11 1847  GLUCAP 105*  TSH --  CORTISOL --   A:  No active issues. Random cortisol was 17.6 (21:29), which is slightly above PM range as  expected. P: Continue to monitor  DERMATOLOGY A: No active issues. P: Continue to monitor.  SYMPTOMS: At this time, patient has no complaints.  Upon arrival to ICU, primary complaint was SOB,    GLOBAL: Full code. No family at bedside 08/12/2011   Vernice Jefferson 08/12/2011, 6:41 AM  STAFF NOTE: I, Dr Lavinia Sharps have personally reviewed patient's available data, including medical history, events of note, physical examination and test results as part of my evaluation. I have discussed with resident/NP and other care providers such as pharmacist, RN and RRT.  In addition,  I personally evaluated patient and elicited key findings of Acute respiratory failure and appears to be in severe ARDS. She is very short and her 8cc/kg/IBW works out to < 300cc. She needed paralytic due to severe ARDS; will plan nimbex for atleast 48h. STill acidotic and needed to liberalize to 9cc. Will check sepsis biomarkers. .  Rest per NP/medical resident whose note is outlined above and that I agree with  The patient is critically ill with multiple organ systems failure and requires high complexity decision making for assessment and support, frequent evaluation and titration of therapies, application of advanced monitoring technologies and extensive interpretation of multiple databases.   Critical Care Time devoted to patient care services described in this note is  45  Minutes.  Dr. Kalman Shan, M.D., [facility].C.P Pulmonary and Critical Care Medicine Staff Physician [facility] Pager: [phone], If no answer or between  15:00h - 7:00h: call [phone]  08/12/2011 5:56 PM

## 2011-08-12 NOTE — Progress Notes (Addendum)
Brief PCCM Progress Note  Rhonda Ramos [birth year] [id number] LOS: 6  Notified of blood gas results for this patient after ventilator settings were adjusted. Still having low pH (7.204) with high CO2 (61). Will adjust tidal volume to 9cc/kg ideal body weight (340 cc). Will re-check ABG in one hour. Will also order 2 D echo. Conferred with Dr. Marchelle Gearing about the results.   Genella Mech 5:45 PM 08/12/2011   Notified of blood gas results after ventilator settings were adjusted to 9 cc/kg (340 cc). PH 7.33 and pCO2 improved. Will not make further changes at this time to her settings and will leave her at the 340 cc tidal volume.   Genella Mech 6:46 PM 08/12/2011

## 2011-08-12 NOTE — Progress Notes (Signed)
   CARDIOTHORACIC SURGERY PROGRESS NOTE  Subjective: Sedated on vent   Objective: Vital signs in last 24 hours: Temp:  [98.9 F (37.2 C)-101.3 F (38.5 C)] 100.4 F (38 C) (04/09 1600) Pulse Rate:  [102-142] 102  (04/09 1944) Cardiac Rhythm:  [-] Sinus tachycardia (04/09 1600) Resp:  [15-45] 32  (04/09 1800) BP: (101-166)/(56-94) 129/56 mmHg (04/09 1944) SpO2:  [90 %-100 %] 100 % (04/09 1944) Arterial Line BP: (136)/(69) 136/69 mmHg (04/09 1800) FiO2 (%):  [50 %-100 %] 50 % (04/09 1944) Weight:  [48.6 kg (107 lb 2.3 oz)] 48.6 kg (107 lb 2.3 oz) (04/09 0500)  Physical Exam:  Rhythm:   sinus  Breath sounds: coarse  Heart sounds:  RRR  Incisions:  n/a  Abdomen:  soft  Extremities:  warm   Intake/Output from previous day: 04/08 0701 - 04/09 0700 In: 1268.3 [P.O.:240; I.V.:775.8; NG/GT:90; IV Piggyback:162.5] Out: 775 [Urine:775] Intake/Output this shift:    Lab Results:  Basename 08/12/11 0421 08/11/11 2129  WBC 13.1* 16.3*  HGB 10.7* 11.3*  HCT 32.4* 34.2*  PLT 278 283   BMET:  Basename 08/12/11 0421 08/11/11 2129  NA 133* 131*  K 3.8 3.9  CL 100 97  CO2 23 23  GLUCOSE 121* 144*  BUN 16 17  CREATININE 0.73 0.79  CALCIUM 9.0 9.3    CBG (last 3)   Basename 08/11/11 1847  GLUCAP 105*   PT/INR:  No results found for this basename: LABPROT,INR in the last 72 hours  CXR:  Clinical Data: Endotracheal tube position. Respiratory failure.  PORTABLE CHEST - 1 VIEW 08/12/2011 Comparison: Earlier today at 1040 hours.  Findings: 1930 hours. Endotracheal tube is appropriately  positioned, 3.1 cm above carina. Right IJ central line unchanged.  Nasogastric tube into the proximal stomach.  Mild cardiomegaly. Right sided pleural effusion is moderate and  slightly increased. No pneumothorax. Worsened interstitial and  airspace disease, lower lobe predominant.  IMPRESSION:  1. Worsened aeration, secondary to progressive interstitial edema.  2. Increased moderate  right pleural effusion.  3. Lower lobe predominant airspace disease, either atelectasis or  infection.  4. Endotracheal tube unchanged, 3.1 cm above carina.  Original Report Authenticated By: Consuello Bossier, M.D.   Assessment/Plan:  VDRF due to likely pneumonia with possible superimposed pulmonary edema.  No recurrence of pneumothorax since chest tube removed despite the need for positive pressure ventilation.  She does have some pleural fluid on the right side but not enough to warrant replacement of tube.  Will follow intermittently, but please call if specific questions or problems arise.   Rhonda Ramos H 08/12/2011 8:03 PM

## 2011-08-12 NOTE — Procedures (Signed)
Aline inserted at this time per MD order. Line was placed in Right radial artery, attempts x 2. Good blood return. Line secured and zeroed per policy. BP 128/70. RT will continue to monitor.

## 2011-08-12 NOTE — Progress Notes (Signed)
Patient had an order for CT angio by the medical team which requires an 18 gauge catheter for contrast administration. Patient had multiple attempts by the nursing staff and IV team but were unable to establish a venous access with large bore canula. I will d/c the order for CT angio at this time. The morning team should reassess the need for CT angio at the time of rounding.   Lars Mage MD [facility] Internal Medicine Resident Pager [phone]

## 2011-08-13 ENCOUNTER — Inpatient Hospital Stay (HOSPITAL_COMMUNITY): Payer: Medicare Other

## 2011-08-13 DIAGNOSIS — I519 Heart disease, unspecified: Secondary | ICD-10-CM

## 2011-08-13 LAB — BASIC METABOLIC PANEL
Chloride: 99 mEq/L (ref 96–112)
Creatinine, Ser: 0.78 mg/dL (ref 0.50–1.10)
GFR calc Af Amer: >90 mL/min (ref >90)
Potassium: 3.2 mEq/L — ABNORMAL LOW (ref 3.5–5.1)

## 2011-08-13 LAB — POCT I-STAT 3, ART BLOOD GAS (G3+)
Acid-Base Excess: 2 mmol/L (ref 0.0–2.0)
Acid-Base Excess: 3 mmol/L — ABNORMAL HIGH (ref 0.0–2.0)
Bicarbonate: 27 mEq/L — ABNORMAL HIGH (ref 20.0–24.0)
O2 Saturation: 94 %
O2 Saturation: 90 %
Patient temperature: 98.4
TCO2: 27 mmol/L (ref 0–100)
TCO2: 28 mmol/L (ref 0–100)
pH, Arterial: 7.464 — ABNORMAL HIGH (ref 7.350–7.400)

## 2011-08-13 LAB — CBC
Platelets: 229 10*3/uL (ref 150–400)
RDW: 14.9 % (ref 11.5–15.5)
WBC: 7.6 10*3/uL (ref 4.0–10.5)

## 2011-08-13 LAB — URINE CULTURE: Colony Count: NO GROWTH

## 2011-08-13 MED ORDER — POTASSIUM CHLORIDE 10 MEQ/100ML IV SOLN
10.0000 meq | INTRAVENOUS | Status: AC
  Administered 2011-08-13 (×4): 10 meq via INTRAVENOUS
  Filled 2011-08-13 (×5): qty 100

## 2011-08-13 MED ORDER — LEVETIRACETAM 100 MG/ML PO SOLN
250.0000 mg | Freq: Every day | ORAL | Status: DC
  Administered 2011-08-14 – 2011-08-22 (×9): 250 mg
  Filled 2011-08-13 (×10): qty 2.5

## 2011-08-13 MED ORDER — JEVITY 1.2 CAL PO LIQD
1000.0000 mL | ORAL | Status: DC
  Administered 2011-08-13: 1000 mL
  Administered 2011-08-14: 45 mL/h
  Filled 2011-08-13 (×4): qty 1000

## 2011-08-13 MED ORDER — FUROSEMIDE 10 MG/ML IJ SOLN: 40.0000 mg | Freq: Every day | INTRAMUSCULAR | Status: DC

## 2011-08-13 MED ORDER — MAGNESIUM SULFATE 40 MG/ML IJ SOLN
2.0000 g | Freq: Once | INTRAMUSCULAR | Status: AC
  Administered 2011-08-13: 2 g via INTRAVENOUS
  Filled 2011-08-13: qty 50

## 2011-08-13 MED ORDER — VANCOMYCIN HCL 500 MG IV SOLR
500.0000 mg | Freq: Two times a day (BID) | INTRAVENOUS | Status: DC
  Administered 2011-08-13 – 2011-08-16 (×8): 500 mg via INTRAVENOUS
  Filled 2011-08-13 (×10): qty 500

## 2011-08-13 MED ORDER — SODIUM CHLORIDE 0.9 % IV SOLN: INTRAVENOUS | Status: DC

## 2011-08-13 MED ORDER — PRO-STAT SUGAR FREE PO LIQD
30.0000 mL | Freq: Every day | ORAL | Status: DC
  Administered 2011-08-13 – 2011-08-19 (×4): 30 mL
  Filled 2011-08-13 (×8): qty 30

## 2011-08-13 MED ORDER — FUROSEMIDE 10 MG/ML IJ SOLN
20.0000 mg | Freq: Every day | INTRAMUSCULAR | Status: DC
  Administered 2011-08-13: 20 mg via INTRAVENOUS
  Filled 2011-08-13 (×2): qty 2

## 2011-08-13 NOTE — Progress Notes (Signed)
  Echocardiogram 2D Echocardiogram has been performed.  Holston Oyama, Real Cons 08/13/2011, 11:25 AM

## 2011-08-13 NOTE — Progress Notes (Signed)
ANTIBIOTIC CONSULT NOTE - FOLLOW UP  Pharmacy Consult for Vanc/Zosyn Indication: rule out pneumonia  Allergies  Allergen Reactions  . Latex Shortness Of Breath  . Codeine Nausea And Vomiting  . Erythromycin Nausea And Vomiting    Patient Measurements: Height: 4\' 8"  (142.2 cm) Weight: 113 lb 15.7 oz (51.7 kg) IBW/kg (Calculated) : 36.3   Vital Signs: Temp: 98.6 F (37 C) (04/10 0737) Temp src: Oral (04/10 0737) BP: 97/61 mmHg (04/10 0900) Pulse Rate: 92  (04/10 0720) Intake/Output from previous day: 04/09 0701 - 04/10 0700 In: 3326 [I.V.:2538.5; NG/GT:190; IV Piggyback:597.5] Out: 870 [Urine:870] Intake/Output from this shift: Total I/O In: 136.3 [I.V.:23.8; IV Piggyback:112.5] Out: 40 [Urine:40]  Labs:  [facility] 08/13/11 0430 08/12/11 0421 08/11/11 2129  WBC 7.6 13.1* 16.3*  HGB 9.1* 10.7* 11.3*  PLT 229 278 283  LABCREA -- -- --  CREATININE 0.78 0.73 0.79   Estimated Creatinine Clearance: 60.2 ml/min (by C-G formula based on Cr of 0.78). No results found for this basename: VANCOTROUGH:2,VANCOPEAK:2,VANCORANDOM:2,GENTTROUGH:2,GENTPEAK:2,GENTRANDOM:2,TOBRATROUGH:2,TOBRAPEAK:2,TOBRARND:2,AMIKACINPEAK:2,AMIKACINTROU:2,AMIKACIN:2, in the last 72 hours   Microbiology: Recent Results (from the past 720 hour(s))  URINE CULTURE     Status: Normal   Collection Time   08/06/11 10:54 PM      Component Value Range Status Comment   Specimen Description URINE, CATHETERIZED   Final    Special Requests Normal   Final    Culture  Setup Time [id number]   Final    Colony Count >=100,000 COLONIES/ML   Final    Culture ESCHERICHIA COLI   Final    Report Status 08/08/2011 FINAL   Final    Organism ID, Bacteria ESCHERICHIA COLI   Final   MRSA PCR SCREENING     Status: Abnormal   Collection Time   08/07/11  9:07 AM      Component Value Range Status Comment   MRSA by PCR POSITIVE (*) NEGATIVE  Final   CULTURE, BLOOD (ROUTINE X 2)     Status: Normal (Preliminary result)   Collection Time   08/11/11  9:35 AM      Component Value Range Status Comment   Specimen Description BLOOD LEFT ARM   Final    Special Requests     Final    Value: BOTTLES DRAWN AEROBIC ONLY 10CC PATIENT ON FOLLOWING CIPRO   Culture  Setup Time [id number]   Final    Culture     Final    Value:        BLOOD CULTURE RECEIVED NO GROWTH TO DATE CULTURE WILL BE HELD FOR 5 DAYS BEFORE ISSUING A FINAL NEGATIVE REPORT   Report Status PENDING   Incomplete   CULTURE, BLOOD (ROUTINE X 2)     Status: Normal (Preliminary result)   Collection Time   08/11/11  9:40 AM      Component Value Range Status Comment   Specimen Description BLOOD LEFT HAND   Final    Special Requests     Final    Value: BOTTLES DRAWN AEROBIC AND ANAEROBIC 10CC BLUE,5CC RED PATIENT ON FOLLOWING CIPRO   Culture  Setup Time [id number]   Final    Culture     Final    Value:        BLOOD CULTURE RECEIVED NO GROWTH TO DATE CULTURE WILL BE HELD FOR 5 DAYS BEFORE ISSUING A FINAL NEGATIVE REPORT   Report Status PENDING   Incomplete   CULTURE, BLOOD (ROUTINE X 2)     Status: Normal (Preliminary result)  Collection Time   08/11/11  9:04 PM      Component Value Range Status Comment   Specimen Description BLOOD LEFT HAND   Final    Special Requests BOTTLES DRAWN AEROBIC ONLY 10CC   Final    Culture  Setup Time [id number]   Final    Culture     Final    Value:        BLOOD CULTURE RECEIVED NO GROWTH TO DATE CULTURE WILL BE HELD FOR 5 DAYS BEFORE ISSUING A FINAL NEGATIVE REPORT   Report Status PENDING   Incomplete   CULTURE, BLOOD (ROUTINE X 2)     Status: Normal (Preliminary result)   Collection Time   08/11/11  9:23 PM      Component Value Range Status Comment   Specimen Description BLOOD RIGHT HAND   Final    Special Requests BOTTLES DRAWN AEROBIC ONLY [facility]   Final    Culture  Setup Time [id number]   Final    Culture     Final    Value:        BLOOD CULTURE RECEIVED NO GROWTH TO DATE CULTURE WILL BE HELD FOR 5 DAYS BEFORE ISSUING  A FINAL NEGATIVE REPORT   Report Status PENDING   Incomplete   URINE CULTURE     Status: Normal   Collection Time   08/12/11  4:32 AM      Component Value Range Status Comment   Specimen Description URINE, CATHETERIZED   Final    Special Requests NONE   Final    Culture  Setup Time [id number]   Final    Colony Count NO GROWTH   Final    Culture NO GROWTH   Final    Report Status 08/13/2011 FINAL   Final     Anti-infectives     Start     Dose/Rate Route Frequency Ordered Stop   08/13/11 1000   vancomycin (VANCOCIN) 500 mg in sodium chloride 0.9 % 100 mL IVPB        500 mg 100 mL/hr over 60 Minutes Intravenous Every 12 hours 08/13/11 0929     08/12/11 1600   vancomycin (VANCOCIN) IVPB 1000 mg/200 mL premix  Status:  Discontinued        1,000 mg 200 mL/hr over 60 Minutes Intravenous Every 24 hours 08/11/11 2107 08/13/11 0929   08/12/11 0000  piperacillin-tazobactam (ZOSYN) IVPB 3.375 g       3.375 g 12.5 mL/hr over 240 Minutes Intravenous 3 times per day 08/11/11 2107     08/11/11 2200   levofloxacin (LEVAQUIN) IVPB 750 mg  Status:  Discontinued        750 mg 100 mL/hr over 90 Minutes Intravenous Every 24 hours 08/11/11 1420 08/11/11 2048   08/11/11 2100   piperacillin-tazobactam (ZOSYN) IVPB 3.375 g  Status:  Discontinued        3.375 g 100 mL/hr over 30 Minutes Intravenous  Once 08/11/11 2048 08/11/11 2103   08/11/11 2100   vancomycin (VANCOCIN) IVPB 1000 mg/200 mL premix  Status:  Discontinued        1,000 mg 200 mL/hr over 60 Minutes Intravenous  Once 08/11/11 2048 08/11/11 2049   08/11/11 1600   piperacillin-tazobactam (ZOSYN) IVPB 3.375 g  Status:  Discontinued        3.375 g 12.5 mL/hr over 240 Minutes Intravenous Every 8 hours 08/11/11 1420 08/11/11 2048   08/11/11 1500   vancomycin (VANCOCIN) IVPB 1000 mg/200 mL premix  Status:  Discontinued        1,000 mg 200 mL/hr over 60 Minutes Intravenous Every 24 hours 08/11/11 1420 08/11/11 2048   08/08/11 1430    ciprofloxacin (CIPRO) tablet 500 mg  Status:  Discontinued        500 mg Oral 2 times daily 08/08/11 1345 08/11/11 1344          Assessment: 46 yo F admitted 08/06/11 for Shortness of breath. Originally started on cipro for e. Coli uti on 4/5. Now on vancomycin and zosyn for broad empiric coverage for possible pneumonia. WBC is trending down and is WNL but still having fevers, tmax 101.3. Renal function is stable (scr 0.78 with CrCl ~ 32ml/min and UOP/24hrs 0.7 ml/kg/hr). Will adjust vancomycin dose  Cx: 4/5 urine - ecoli  Repeat urine on 4/8: No growth 4/8 bld x2 -NGTD  Abx: 4/5 cipro>>4/8 4/8 levaquin>>4/8 (never received a dose) 4/8 vanc>> 4/8 zosyn>>  Goal of Therapy:  Vancomycin trough level 15-20 mcg/ml  Plan:  1) Change Vancomycin to 500 mg IV q12hrs 2) Continue Zosyn 3.375 grams IV q8hrs (4 hr infusion) 3) Monitor renal fx, fever curve, cxs, LOT, check trough at steady state as indicated  Janace Litten, PharmD [phone] 08/13/2011,9:32 AM

## 2011-08-13 NOTE — Progress Notes (Signed)
TCTS BRIEF PROGRESS NOTE   Stable/improved CXR unchanged Will continue to follow   Cadance Raus H 08/13/2011 3:56 PM

## 2011-08-13 NOTE — Progress Notes (Signed)
Name: Rhonda Ramos DOB: February 12, 1966 MRN: [id number] PCP: Rene Paci, MD, MD ADMIT DATE: 08/06/2011 LOS: 7  PCCM PROGRESS NOTE  Brief Patient Profile: Patient is a 46yo F with h/o chronic pain syndrome related to spina bifida & scoliosis admitted on 08/06/11 to [facility] for increasing SOB. In Feb 2013, she underwent VATS for stapling of blebs with mech pleurodesis by Dr. Tyrone Sage. Patient was found to have right spontaneous pneumothorax and a right sided chest tube was placed by Dr. Sharlene Motts on 4/3. Patient had urinary tract infection diagnosed on 4/5 and was started on Cipro. Patient started having fever and chills starting yesterday (4/7). Fever was as high as 101 F on 4/7. A CXR was obtained which showed bibasilar atelactasis on 4/7. Chest tube was taken out on 4/7. On 4/8 patient continued to have fever along with developing shortness of breath for which she was placed on non rebreather and ABG was obtained. PCCM was consulted when patient continued to do worse on non-rebreather and tranferred to ICU, is currently on ARDS protocol.   Best Practice: DVT: Lovenox SUP: Protonix Nutrition: Tube feeds planned 08/13/2011 Glycemic control: None Sedation/analgesia: continuous fentanyl with prn versed  Lines / Drains: Right IJ 4/8>>>  ETT 4/8>>>  Micro:  Lab 08/13/11 0430 08/11/11 2129  LATICACIDVEN 0.7 0.9   Recent Results (from the past 240 hour(s))  URINE CULTURE     Status: Normal   Collection Time   08/06/11 10:54 PM      Component Value Range Status Comment   Specimen Description URINE, CATHETERIZED   Final    Special Requests Normal   Final    Culture  Setup Time [id number]   Final    Colony Count >=100,000 COLONIES/ML   Final    Culture ESCHERICHIA COLI   Final    Report Status 08/08/2011 FINAL   Final    Organism ID, Bacteria ESCHERICHIA COLI   Final   MRSA PCR SCREENING     Status: Abnormal   Collection Time   08/07/11  9:07 AM      Component Value Range Status Comment   MRSA by  PCR POSITIVE (*) NEGATIVE  Final   CULTURE, BLOOD (ROUTINE X 2)     Status: Normal (Preliminary result)   Collection Time   08/11/11  9:35 AM      Component Value Range Status Comment   Specimen Description BLOOD LEFT ARM   Final    Special Requests     Final    Value: BOTTLES DRAWN AEROBIC ONLY 10CC PATIENT ON FOLLOWING CIPRO   Culture  Setup Time [id number]   Final    Culture     Final    Value:        BLOOD CULTURE RECEIVED NO GROWTH TO DATE CULTURE WILL BE HELD FOR 5 DAYS BEFORE ISSUING A FINAL NEGATIVE REPORT   Report Status PENDING   Incomplete   CULTURE, BLOOD (ROUTINE X 2)     Status: Normal (Preliminary result)   Collection Time   08/11/11  9:40 AM      Component Value Range Status Comment   Specimen Description BLOOD LEFT HAND   Final    Special Requests     Final    Value: BOTTLES DRAWN AEROBIC AND ANAEROBIC 10CC BLUE,5CC RED PATIENT ON FOLLOWING CIPRO   Culture  Setup Time [id number]   Final    Culture     Final    Value:        BLOOD  CULTURE RECEIVED NO GROWTH TO DATE CULTURE WILL BE HELD FOR 5 DAYS BEFORE ISSUING A FINAL NEGATIVE REPORT   Report Status PENDING   Incomplete   CULTURE, BLOOD (ROUTINE X 2)     Status: Normal (Preliminary result)   Collection Time   08/11/11  9:04 PM      Component Value Range Status Comment   Specimen Description BLOOD LEFT HAND   Final    Special Requests BOTTLES DRAWN AEROBIC ONLY 10CC   Final    Culture  Setup Time [id number]   Final    Culture     Final    Value:        BLOOD CULTURE RECEIVED NO GROWTH TO DATE CULTURE WILL BE HELD FOR 5 DAYS BEFORE ISSUING A FINAL NEGATIVE REPORT   Report Status PENDING   Incomplete   CULTURE, BLOOD (ROUTINE X 2)     Status: Normal (Preliminary result)   Collection Time   08/11/11  9:23 PM      Component Value Range Status Comment   Specimen Description BLOOD RIGHT HAND   Final    Special Requests BOTTLES DRAWN AEROBIC ONLY [facility]   Final    Culture  Setup Time [id number]   Final    Culture      Final    Value:        BLOOD CULTURE RECEIVED NO GROWTH TO DATE CULTURE WILL BE HELD FOR 5 DAYS BEFORE ISSUING A FINAL NEGATIVE REPORT   Report Status PENDING   Incomplete   URINE CULTURE     Status: Normal   Collection Time   08/12/11  4:32 AM      Component Value Range Status Comment   Specimen Description URINE, CATHETERIZED   Final    Special Requests NONE   Final    Culture  Setup Time [id number]   Final    Colony Count NO GROWTH   Final    Culture NO GROWTH   Final    Report Status 08/13/2011 FINAL   Final    Resp culture 4/8>>>  Urine culture 4/8>>>  Blood culture 4/8>>>   Antibiotics: Anti-infectives     Start     Dose/Rate Route Frequency Ordered Stop   08/13/11 1000   vancomycin (VANCOCIN) 500 mg in sodium chloride 0.9 % 100 mL IVPB        500 mg 100 mL/hr over 60 Minutes Intravenous Every 12 hours 08/13/11 0929     08/12/11 1600   vancomycin (VANCOCIN) IVPB 1000 mg/200 mL premix  Status:  Discontinued        1,000 mg 200 mL/hr over 60 Minutes Intravenous Every 24 hours 08/11/11 2107 08/13/11 0929   08/12/11 0000   piperacillin-tazobactam (ZOSYN) IVPB 3.375 g        3.375 g 12.5 mL/hr over 240 Minutes Intravenous 3 times per day 08/11/11 2107     08/11/11 2200   levofloxacin (LEVAQUIN) IVPB 750 mg  Status:  Discontinued        750 mg 100 mL/hr over 90 Minutes Intravenous Every 24 hours 08/11/11 1420 08/11/11 2048   08/11/11 2100   piperacillin-tazobactam (ZOSYN) IVPB 3.375 g  Status:  Discontinued        3.375 g 100 mL/hr over 30 Minutes Intravenous  Once 08/11/11 2048 08/11/11 2103   08/11/11 2100   vancomycin (VANCOCIN) IVPB 1000 mg/200 mL premix  Status:  Discontinued        1,000 mg 200 mL/hr over 60 Minutes Intravenous  Once 08/11/11 2048 08/11/11 2049   08/11/11 1600   piperacillin-tazobactam (ZOSYN) IVPB 3.375 g  Status:  Discontinued        3.375 g 12.5 mL/hr over 240 Minutes Intravenous Every 8 hours 08/11/11 1420 08/11/11 2048   08/11/11 1500    vancomycin (VANCOCIN) IVPB 1000 mg/200 mL premix  Status:  Discontinued        1,000 mg 200 mL/hr over 60 Minutes Intravenous Every 24 hours 08/11/11 1420 08/11/11 2048   08/08/11 1430   ciprofloxacin (CIPRO) tablet 500 mg  Status:  Discontinued        500 mg Oral 2 times daily 08/08/11 1345 08/11/11 1344          Tests / Events: Chest CT 4/3>>new pneumothrax  4/7 CT out CXR 4/8>>right lower lobe opacity  Consults: CTCS, TRH   Subjective/Overnight/Interval History:   Did well overnight.  No acute events.  Improved fio2 needs   Vital Signs: Temp:  [98.5 F (36.9 C)-100.4 F (38 C)] 98.6 F (37 C) (04/10 0737) Pulse Rate:  [92-120] 95  (04/10 1200) Resp:  [24-35] 32  (04/10 1200) BP: (92-144)/(50-70) 97/56 mmHg (04/10 1200) SpO2:  [85 %-100 %] 98 % (04/10 1200) Arterial Line BP: (62-143)/(50-79) 96/50 mmHg (04/10 1200) FiO2 (%):  [39.8 %-80.1 %] 50 % (04/10 1200) Weight:  [51.7 kg (113 lb 15.7 oz)] 51.7 kg (113 lb 15.7 oz) (04/10 0400) I/O last 3 completed shifts: In: 4354.4 [I.V.:3314.4; NG/GT:280; IV Piggyback:760] Out: 1645 [Urine:1645]  Physical Examination: Last febrile at 1440 on 4/8 General: no acute distress Neuro:  Awake, follows commands, +corneal and pupillary reflex HEENT: Intubated   Cardiovascular:  tachycardic Lungs:   improved aeration b/l, Breath sounds on R < L Abdomen:  Soft, NT/ND, normoactive BS   Ventilator settings: Vent Mode:  [-] PRVC FiO2 (%):  [39.8 %-80.1 %] 50 % Set Rate:  [32 bmp-35 bmp] 32 bmp Vt Set:  [290 mL-340 mL] 340 mL PEEP:  [8 cmH20-14 cmH20] 8 cmH20 Plateau Pressure:  [20 cmH20-27 cmH20] 20 cmH20  Labs/Radiology: Please see A and P for full labs  Dg Chest Port 1 View  08/13/2011  *RADIOLOGY REPORT*  Clinical Data: ARDS.  Ventilator.  PORTABLE CHEST - 1 VIEW  Comparison: Multiple priors, most recently 08/12/2011.  Findings: An endotracheal tube is in place with tip 2.4 cm above the carina.  A nasogastric tube is  coiled upon itself with tip in the body of the stomach.  There is a right-sided internal jugular central venous catheter with tip terminating in the distal superior vena cava. Lung volumes are low.  There continue to be diffuse interstitial and patchy airspace opacities throughout the lungs bilaterally, unchanged compared to recent prior examinations, which may reflect underlying pulmonary edema and/or multifocal infection. Probable small left-sided pleural effusion and moderate right-sided pleural effusions are unchanged.  IMPRESSION: 1.  Support apparatus, as above. 2.  Multifocal interstitial and airspace opacities redemonstrated which may reflect edema and/or multifocal infection. 3.  Moderate right-sided and small left-sided pleural effusions are unchanged.  Original Report Authenticated By: Florencia Reasons, M.D.   Dg Chest Port 1 View  08/12/2011  *RADIOLOGY REPORT*  Clinical Data: Endotracheal tube position.  Respiratory failure.  PORTABLE CHEST - 1 VIEW  Comparison: Earlier today at 1040 hours.  Findings: 1930 hours.  Endotracheal tube is appropriately positioned, 3.1 cm above carina.  Right IJ central line unchanged. Nasogastric tube into the proximal stomach.  Mild cardiomegaly.  Right sided pleural effusion  is moderate and slightly increased. No pneumothorax.  Worsened interstitial and airspace disease, lower lobe predominant.  IMPRESSION:  1.  Worsened aeration, secondary to progressive interstitial edema. 2.  Increased moderate right pleural effusion. 3.  Lower lobe predominant airspace disease, either atelectasis or infection. 4.  Endotracheal tube unchanged, 3.1 cm above carina.  Original Report Authenticated By: Consuello Bossier, M.D.   Dg Chest Port 1 View  08/12/2011  *RADIOLOGY REPORT*  Clinical Data: Evaluation of endotracheal tube after advancement.  PORTABLE CHEST - 1 VIEW  Comparison: 08/12/2011.  Findings: On the prior examination the tip of the endotracheal tube terminates 6.5 cm above  the carina.  On the current study the tip of the endotracheal tube terminates 5.3 cm above the carina. Enteric tube is in place with its tip in the proximal stomach. Cardiac silhouette is borderline enlarged.  There are low lung volumes.  There is vascular congestion pattern.  Patchy infiltrative densities are seen in the perihilar regions and in the mid and lower lung areas bilaterally.  There is blunting of the right costophrenic angle consistent with right pleural effusion.  IMPRESSION: Tip of endotracheal tube terminates 5.3 cm above the carina. Enteric tube in place.  Stable cardiac silhouette enlargement.  Low lung volumes.  Vascular congestion pattern.  Bilateral pulmonary infiltrative densities without significant change.  Right pleural effusion.  Original Report Authenticated By: Crawford Givens, M.D.   Portable Chest Xray In Am  08/12/2011  *RADIOLOGY REPORT*  Clinical Data: Evaluate endotracheal tube, on ventilator  PORTABLE CHEST - 1 VIEW  Comparison: Portable chest x-ray of 08/11/2011  Findings: The lungs remain poorly aerated with patchy diffuse airspace disease unchanged.  There is a probable right pleural effusion present with volume loss on the right.  Mild cardiomegaly is stable.  The endotracheal tube and central venous line appear unchanged in position.  IMPRESSION: No significant change in poor aeration with patchy airspace disease bilaterally.  Probable moderate sized right pleural effusion.  Original Report Authenticated By: Juline Patch, M.D.   Dg Chest Port 1 View  08/12/2011  *RADIOLOGY REPORT*  Clinical Data: Check endotracheal tube placement.  PORTABLE CHEST - 1 VIEW  Comparison: Chest radiograph performed earlier today at 10:03 p.m.  Findings: The patient's endotracheal tube is seen ending approximately 2 cm above the carina.  The lungs remain hypoexpanded, though slightly improved from the prior study.  A moderate right-sided pleural effusion is noted. Underlying persistent bilateral  airspace disease may reflect pulmonary edema or multifocal pneumonia.  No definite pneumothorax is seen.  The cardiomediastinal silhouette remains normal in size.  The patient's enteric tube is seen correlate within the stomach and ending at the fundus of the stomach.  A right IJ line is noted ending about the mid to distal SVC.  No acute osseous abnormalities are seen.  IMPRESSION:  1.  Endotracheal tube seen ending 2 cm above the carina. 2.  Lungs remain hypoexpanded; moderate right-sided pleural effusion.  Underlying persistent bilateral airspace disease may reflect pulmonary edema or multifocal pneumonia.  These results were called by telephone on 08/12/2011  at  12:58 a.m. to  Nursing on [id number], who verbally acknowledged these results.  Original Report Authenticated By: Tonia Ghent, M.D.   Dg Chest Port 1 View  08/11/2011  *RADIOLOGY REPORT*  Clinical Data: Endotracheal tube placement.  PORTABLE CHEST - 1 VIEW  Comparison: 08/11/2011.  Findings: Endotracheal tube tip at the level of the carina directed towards the right mainstem bronchus.  This needs  to be retracted by 3 cm.  Results discussed with Dr. Kendrick Fries.  Right central line tip mid superior vena cava level.  Evaluation limited secondary to the pulmonary parenchymal changes.  No gross pneumothorax.  Nasogastric tube curled within the gastric fundus region.  IMPRESSION: Endotracheal tube tip about to enter the right mainstem bronchus. Recommend retracted by 3 cm.  Progressive air space disease right lung.  Original Report Authenticated By: Fuller Canada, M.D.   Portable Chest Xray  08/11/2011  *RADIOLOGY REPORT*  Clinical Data: Intubation  PORTABLE CHEST - 1 VIEW  Comparison: 08/11/2011  Findings: Right mainstem intubation evident.  This can be retracted 5.5 cm.  Right IJ central line in the SVC.  Low lung volumes persist with bibasilar atelectasis and airspace disease.  Very minimal improvement in aeration.  Small right effusion not excluded.   No pneumothorax.  IMPRESSION: Right  main stem intubation, retract 5.5 cm  Right IJ central line in the SVC  Slight improvement diffuse airspace disease atelectasis pattern.  No pneumothorax  Critical Value/emergent results were called by telephone at the time of interpretation on 08/11/2011  at 9:30 p.m.  to  [facility], RN, who verbally acknowledged these results.  Original Report Authenticated By: Judie Petit. Ruel Favors, M.D.   Dg Chest Port 1 View  08/11/2011  *RADIOLOGY REPORT*  Clinical Data: Shortness of breath  PORTABLE CHEST - 1 VIEW  Comparison: 08/11/2011  Findings: Persistent low lung volumes with slight improvement in perihilar and lower lobe atelectasis/airspace process.  Pneumonia not excluded.  Stable right effusion.  No pneumothorax.  Scoliosis of the spine.  IMPRESSION: Slight improvement in bibasilar atelectasis/airspace process. Pneumonia not excluded.  Stable right effusion  No significant or enlarging pneumothorax  Original Report Authenticated By: Judie Petit. Ruel Favors, M.D.   Dg Abd Portable 1v  08/11/2011  *RADIOLOGY REPORT*  Clinical Data: Spina bifida, NG tube placement, abdominal distention  PORTABLE ABDOMEN - 1 VIEW  Comparison: 04/04/2011  Findings: NG tube coiled in the stomach proximally.  Limited exam because of portable technique and motion artifact.  Gaseous distention noted in the left upper quadrant either in the stomach or the colon.  Spina bifida noted of the lower lumbar spine with an associated scoliosis.  Atelectasis or airspace disease in the right lower lobe.  IMPRESSION: NG tube proximal stomach.  Limited exam.  Original Report Authenticated By: Judie Petit. TREVOR Miles Costain, M.D.    ASSESSMENT AND PLAN  RESPIRATORY  Lab 08/13/11 1049 08/13/11 0449 08/12/11 2109 08/12/11 1842 08/12/11 1732  PHART 7.456* 7.464* 7.384 7.313* 7.204*  PCO2ART 38.3 35.7 43.3 54.4* 61.1*  PO2ART 56.0* 65.0* 72.0* 107.0* 137.0*  HCO3 27.0* 25.6* 25.9* 27.2* 23.5  O2SAT 90.0 94.0 94.0 97.0 98.0   A:   Acute hypoxemic resp failure/ARDS d/t HCAP v UTI.  Intubated on 4/8. On 48h nimbex since 4/9 due to severe ARDS and vent asynchroncy. Needed to be liberalized to 9cc/kg/IBW on 4/9 due to scoliosis and related acidosis.  P: Full mech support with ARDS protocol; paralyze with nimbex (neuromuscular blockade protocol) On empiric vanc and zosyn.  D/C IVF given elevated CVP of 15 today.  Lasix trial x 1  CARDIAC  Lab 08/12/11 1000 08/11/11 2129  TROPONINI -- <0.30  PROBNP 297.7* --   A: Patient tachycardic this morning, likely d/t PNA, CVP remained >15 overnight, CE (-) as above P: d/c IVF for target CVP <6.   NEUROLOGIC A: Deeply sedated and paralyzed since 4/9 due to ARDS protocol and 48h nimbex  P: Continue to monitor on paralytic as above. No wakeup asessment 08/13/2011   VASCULAR/HEMODYNAMIC A:  BP stable with CVP >15 P: Continue to monitor and d/c IVF and Amlodipine  INFECTIOUS DISEASE  Lab 08/13/11 0430 08/12/11 0421 08/11/11 2129 08/11/11 0935 08/10/11 1124  WBC 7.6 13.1* 16.3* 15.9* 16.2*  PROCALCITON 3.02 -- 2.09 -- --   A:  WBC trending down; infectious etiology likely multifocal HCAP PNA given last night's CXR, AM CXR report pending. Off pressors P: Continue vanc/zosyn, goal to stop abx on 4/17   RENAL  Lab 08/13/11 0430 08/12/11 0421 08/11/11 2129 08/11/11 1502 08/07/11 0545  BUN 14 16 17 19 15   CREATININE 0.78 0.73 0.79 0.78 0.75   I/O last 3 completed shifts: In: 4354.4 [I.V.:3314.4; NG/GT:280; IV Piggyback:760] Out: 1645 [Urine:1645] A:  No acute issues. P: Continue to monitor  ELECTROLYTES  Lab 08/13/11 0430 08/12/11 0421 08/11/11 2129 08/11/11 1502 08/07/11 0545  NA 134* 133* 131* 133* 137  K 3.2* 3.8 -- -- --  CL 99 100 97 100 102  CO2 28 23 23 23 26   BUN 14 16 17 19 15   CREATININE 0.78 0.73 0.79 0.78 0.75  CALCIUM 8.5 9.0 9.3 9.9 10.0  MG -- 1.8 -- -- --  PHOS -- -- -- -- --   A: mildly hyponatremic, possibly d/t volume overload, and hypokalemia  this AM P: d/c IVF and give KCL 52meq/h x4.   HEMATOLOGIC  Lab 08/13/11 0430 08/12/11 0421 08/11/11 2129 08/11/11 0935 08/10/11 1124  HGB 9.1* 10.7* 11.3* 12.0 13.0  HCT 27.8* 32.4* 34.2* 36.4 38.9  PLT 229 278 283 304 331  INR -- -- -- -- --  APTT -- -- -- -- --   A:  Anemia of critical illness P:PRBC for hgb < 7gm% unless actively bleeding or in MI  GASTROINTESTINAL  Lab 08/11/11 2129  AST 21  ALT 11  ALKPHOS 85  BILITOT 0.6  PROT 6.6  ALBUMIN 2.7*   A:  Hypoalbuminemic, likely d/t chronic disease and probable decreased PO intake P: Starting TF today per nutrition.   ENDOCRINE  Lab 08/11/11 1847  GLUCAP 105*  TSH --  CORTISOL --   A:  No active issues. Random cortisol was 17.6 (21:29), which is slightly above PM range as expected. P: Continue to monitor  DERMATOLOGY A: No active issues. P: Continue to monitor.  SYMPTOMS: At this time, patient has no complaints.   GLOBAL: Full code. No family at bedside 08/13/2011   Darnelle Maffucci, MD Pager: [phone] 08/13/2011   STAFF NOTE: I, Dr Lavinia Sharps have personally reviewed patient's available data, including medical history, events of note, physical examination and test results as part of my evaluation. I have discussed with resident/NP and other care providers such as pharmacist, RN and RRT.  In addition,  I personally evaluated patient and elicited key findings of severe ARDS with sginificant improvement in hypoxemia after being paralyzed. Acidosis improved too. Off bicarb. Wil dc nimbex 08/14/11 after 48h trial complete. Off pressors; will start diuresis today*.  Rest per NP/medical resident whose note is outlined above and that I agree with  The patient is critically ill with multiple organ systems failure and requires high complexity decision making for assessment and support, frequent evaluation and titration of therapies, application of advanced monitoring technologies and extensive interpretation of multiple  databases.   Critical Care Time devoted to patient care services described in this note is  31  Minutes.  Dr. Kalman Shan, M.D.,  F.C.C.P Pulmonary and Critical Care Medicine Staff Physician [facility] Pager: [phone], If no answer or between  15:00h - 7:00h: call [phone]  08/13/2011 2:04 PM

## 2011-08-13 NOTE — Progress Notes (Signed)
Chaplain Note:  Chaplain visited with pt and pt's family who had was at beside.  Pt was intubated, asleep, and did not interact during this visit.  Chaplain offered spiritual comfort and support for pt's family.  Though the pt's family appreciated chaplain support, they expressed no specific spiritual needs.  Chaplain will follow up as needed.   08/13/11 1300  Clinical Encounter Type  Visited With Patient and family together  Visit Type Initial;Spiritual support;Critical Care  Spiritual Encounters  Spiritual Needs Emotional  Stress Factors  Patient Stress Factors Health changes  Family Stress Factors Major life changes    Verdie Shire, chaplain resident [phone]

## 2011-08-13 NOTE — Progress Notes (Signed)
INITIAL ADULT NUTRITION ASSESSMENT Date: 08/13/2011   Time: 10:03 AM  Reason for Assessment: VDRF; Consult for TF initiation and management  ASSESSMENT: Female 46 y.o.  Dx: Recurrent spontaneous pneumothorax; S/P chest tube placement; E. coli UTI; Intubated 4/8 due to respiratory insufficiency  Hx:  Past Medical History  Diagnosis Date  . Chronic pain syndrome     s/p rehab - narc detox fall 2011  . SPINA BIFIDA   . Scoliosis     multiple surg for same  . DEPRESSION     > ptsd (spouse suicide/gsw 11/2005  . GERD   . HYPERTENSION   . Anxiety   . Pneumothorax on right 04/03/11, 06/10/11    recurrent s/p VATS 2/13  . Seizures     onset fall 2011 with narc detox  . Chronic back pain greater than 3 months duration   . 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 Urinary tract infection 08/08/2011    E. coli    Related Meds:  Scheduled Meds:   . antiseptic oral rinse  15 mL Mouth Rinse QID  . artificial tears  1 application Both Eyes Q8H  . chlorhexidine  15 mL Mouth/Throat BID  . Chlorhexidine Gluconate Cloth  6 each Topical Q0600  . docusate  100 mg Per Tube BID  . FLUoxetine  60 mg Oral Daily  . furosemide  20 mg Intravenous Daily  . heparin  5,000 Units Subcutaneous Q8H  . levalbuterol  4 puff Inhalation Q8H  . levetiracetam  250 mg Intravenous Q12H  . mupirocin ointment  1 application Nasal BID  . norethindrone  1 tablet Oral Q24H  . pantoprazole sodium  40 mg Per Tube Q1200  . piperacillin-tazobactam (ZOSYN)  IV  3.375 g Intravenous Q8H  . potassium chloride  10 mEq Intravenous Q1 Hr x 4  . sodium chloride  500 mL Intravenous Once  . sodium chloride  3 mL Intravenous Q12H  . vancomycin  500 mg Intravenous Q12H  . vecuronium  6 mg Intravenous Once  . DISCONTD: albuterol  6-8 puff Inhalation Q4H  . DISCONTD: amLODipine  5 mg Oral Daily  . DISCONTD: cisatracurium  0.1 mg/kg Intravenous Once  . DISCONTD: enoxaparin  (LOVENOX) injection  40 mg Subcutaneous QHS  . DISCONTD: fentaNYL  50 mcg Transdermal Once  . DISCONTD: furosemide  40 mg Intravenous Daily  . DISCONTD: levalbuterol  0.63 mg Nebulization Q8H  . DISCONTD: mulitivitamin with minerals  1 tablet Oral Daily  . DISCONTD: pantoprazole (PROTONIX) IV  40 mg Intravenous Q24H  . DISCONTD: sodium chloride  25 mL/kg Intravenous Once  . DISCONTD: sodium chloride  3 mL Intravenous Q12H  . DISCONTD: vancomycin  1,000 mg Intravenous Q24H  . DISCONTD: vecuronium  6 mg Intravenous Once   Continuous Infusions:   . cisatracurium (NIMBEX) infusion 2 mcg/kg/min (08/13/11 0800)  . fentaNYL infusion INTRAVENOUS 150 mcg/hr (08/13/11 0800)  . midazolam (VERSED) infusion 3 mg/hr (08/13/11 0800)  . DISCONTD: sodium chloride 100 mL/hr at 08/12/11 0010  . DISCONTD: sodium chloride    . DISCONTD:  sodium bicarbonate infusion 1000 mL 100 mL/hr at 08/12/11 1700   PRN Meds:.acetaminophen, albuterol, alum & mag hydroxide-simeth, DOBUTamine, fentaNYL, midazolam, norepinephrine (LEVOPHED) Adult infusion, ondansetron (ZOFRAN) IV, ondansetron, promethazine, sodium chloride, sodium chloride, DISCONTD: sodium chloride, DISCONTD: sodium chloride   Ht: 4\' 8"  (142.2 cm)  Wt: 113 lb 15.7 oz (51.7 kg)  Ideal Wt: 42.4 kg  % Ideal Wt: 122%  Wt  Readings from Last 15 Encounters:  08/13/11 113 lb 15.7 oz (51.7 kg)  07/21/11 108 lb 12.8 oz (49.351 kg)  07/03/11 105 lb (47.628 kg)  06/16/11 109 lb 5.6 oz (49.6 kg)  06/16/11 109 lb 5.6 oz (49.6 kg)  04/15/11 110 lb (49.896 kg)  04/04/11 113 lb 1.9 oz (51.31 kg)  08/28/10 113 lb 1.9 oz (51.311 kg)  12/04/09 91 lb (41.277 kg)  11/13/09 91 lb (41.277 kg)   Usual Wt: 105-113 lb % Usual Wt: >100%  Body mass index is 25.55 kg/(m^2).  Food/Nutrition Related Hx: No nutrition problems identified on admission nutrition screen.  Labs:  CMP     Component Value Date/Time   NA 134* 08/13/2011 0430   K 3.2* 08/13/2011 0430   CL 99  08/13/2011 0430   CO2 28 08/13/2011 0430   GLUCOSE 124* 08/13/2011 0430   BUN 14 08/13/2011 0430   CREATININE 0.78 08/13/2011 0430   CALCIUM 8.5 08/13/2011 0430   PROT 6.6 08/11/2011 2129   ALBUMIN 2.7* 08/11/2011 2129   AST 21 08/11/2011 2129   ALT 11 08/11/2011 2129   ALKPHOS 85 08/11/2011 2129   BILITOT 0.6 08/11/2011 2129   GFRNONAA >90 08/13/2011 0430   GFRAA >90 08/13/2011 0430    CBG (last 3)   Basename 08/11/11 1847  GLUCAP 105*    Intake/Output Summary (Last 24 hours) at 08/13/11 1009 Last data filed at 08/13/11 0800  Gross per 24 hour  Intake 2982.34 ml  Output    820 ml  Net 2162.34 ml    Diet Order: NPO  Previous Diet:  Regular with good intake (mostly 50-100% of meals)  IVF:    cisatracurium (NIMBEX) infusion Last Rate: 2 mcg/kg/min (08/13/11 0800)  fentaNYL infusion INTRAVENOUS Last Rate: 150 mcg/hr (08/13/11 0800)  midazolam (VERSED) infusion Last Rate: 3 mg/hr (08/13/11 0800)  DISCONTD: sodium chloride Last Rate: 100 mL/hr at 08/12/11 0010  DISCONTD: sodium chloride   DISCONTD:  sodium bicarbonate infusion 1000 mL Last Rate: 100 mL/hr at 08/12/11 1700    Estimated Nutritional Needs:   Kcal: 1400 Protein: 65-75 grams Fluid: 1.5-1.6 liters  Patient was intubated 4/8 for respiratory insufficiency.  NG tube in place with tip in the proximal stomach.  Received consult for TF initiation and management.  NUTRITION DIAGNOSIS: -Inadequate oral intake (NI-2.1).  Status: Ongoing  RELATED TO: inability to eat  AS EVIDENCED BY: NPO status  MONITORING/EVALUATION(Goals): Goal:  Intake to meet >90% of estimated nutrition needs. Monitor:  TF tolerance/adequacy, labs, weight trend.  EDUCATION NEEDS: -Education not appropriate at this time  INTERVENTION: Start TF via NG tube with Jevity 1.2 at 25 ml/h, increase by 10 ml every 4 hours to goal rate of 45 ml/h with Prostat 30 ml once daily to provide 1368 kcals, 75 grams protein, 875 ml free water daily.  Dietitian #:   [phone]  DOCUMENTATION CODES Per approved criteria  -Not Applicable    Hettie Holstein 08/13/2011, 10:03 AM

## 2011-08-14 ENCOUNTER — Inpatient Hospital Stay (HOSPITAL_COMMUNITY): Payer: Medicare Other

## 2011-08-14 DIAGNOSIS — J9600 Acute respiratory failure, unspecified whether with hypoxia or hypercapnia: Secondary | ICD-10-CM

## 2011-08-14 DIAGNOSIS — J984 Other disorders of lung: Secondary | ICD-10-CM

## 2011-08-14 DIAGNOSIS — M7989 Other specified soft tissue disorders: Secondary | ICD-10-CM

## 2011-08-14 LAB — CBC
HCT: 27.3 % — ABNORMAL LOW (ref 36.0–46.0)
Hemoglobin: 8.8 g/dL — ABNORMAL LOW (ref 12.0–15.0)
MCV: 92.9 fL (ref 78.0–100.0)
Platelets: 281 10*3/uL (ref 150–400)
RDW: 15.7 % — ABNORMAL HIGH (ref 11.5–15.5)

## 2011-08-14 LAB — BASIC METABOLIC PANEL
BUN: 22 mg/dL (ref 6–23)
Calcium: 8.4 mg/dL (ref 8.4–10.5)
GFR calc non Af Amer: 68 mL/min — ABNORMAL LOW (ref >90)
Glucose, Bld: 143 mg/dL — ABNORMAL HIGH (ref 70–99)
Potassium: 3.3 mEq/L — ABNORMAL LOW (ref 3.5–5.1)

## 2011-08-14 MED ORDER — POTASSIUM CHLORIDE 20 MEQ/15ML (10%) PO LIQD
ORAL | Status: AC
  Administered 2011-08-14: 40 meq via ORAL
  Filled 2011-08-14: qty 30

## 2011-08-14 MED ORDER — FUROSEMIDE 10 MG/ML IJ SOLN
40.0000 mg | Freq: Every day | INTRAMUSCULAR | Status: DC
  Administered 2011-08-14: 40 mg via INTRAVENOUS
  Filled 2011-08-14 (×2): qty 4

## 2011-08-14 MED ORDER — SODIUM CHLORIDE 0.9 % IV SOLN: INTRAVENOUS | Status: DC

## 2011-08-14 MED ORDER — POTASSIUM CHLORIDE 20 MEQ/15ML (10%) PO LIQD
40.0000 meq | Freq: Once | ORAL | Status: AC
  Administered 2011-08-14: 40 meq via ORAL
  Filled 2011-08-14: qty 30

## 2011-08-14 NOTE — Progress Notes (Signed)
Name: Rhonda Ramos DOB: Feb 06, 1966 MRN: [id number] PCP: Rene Paci, MD, MD ADMIT DATE: 08/06/2011 LOS: 8  PCCM PROGRESS NOTE  Brief Patient Profile: Patient is a 46yo F with h/o chronic pain syndrome related to spina bifida & scoliosis admitted on 08/06/11 to [facility] for increasing SOB. In Feb 2013, she underwent VATS for stapling of blebs with mech pleurodesis by Dr. Tyrone Sage. Patient was found to have right spontaneous pneumothorax and a right sided chest tube was placed by Dr. Sharlene Motts on 4/3. Patient had urinary tract infection diagnosed on 4/5 and was started on Cipro. Patient started having fever and chills starting yesterday (4/7). Fever was as high as 101 F on 4/7. A CXR was obtained which showed bibasilar atelactasis on 4/7. Chest tube was taken out on 4/7. On 4/8 patient continued to have fever along with developing shortness of breath for which she was placed on non rebreather and ABG was obtained. PCCM was consulted when patient continued to do worse on non-rebreather and tranferred to ICU, is currently on ARDS protocol.   Best Practice: DVT: Lovenox SUP: Protonix Nutrition: Tube feeds planned 08/13/2011 Glycemic control: None Sedation/analgesia: continuous fentanyl with prn versed  Lines / Drains: Right IJ 4/8>>>  ETT 4/8>>> Aline >. ? Date >> 08/14/2011  Micro:  Lab 08/13/11 0430 08/11/11 2129  LATICACIDVEN 0.7 0.9   Recent Results (from the past 240 hour(s))  URINE CULTURE     Status: Normal   Collection Time   08/06/11 10:54 PM      Component Value Range Status Comment   Specimen Description URINE, CATHETERIZED   Final    Special Requests Normal   Final    Culture  Setup Time [id number]   Final    Colony Count >=100,000 COLONIES/ML   Final    Culture ESCHERICHIA COLI   Final    Report Status 08/08/2011 FINAL   Final    Organism ID, Bacteria ESCHERICHIA COLI   Final   MRSA PCR SCREENING     Status: Abnormal   Collection Time   08/07/11  9:07 AM      Component Value  Range Status Comment   MRSA by PCR POSITIVE (*) NEGATIVE  Final   CULTURE, BLOOD (ROUTINE X 2)     Status: Normal (Preliminary result)   Collection Time   08/11/11  9:35 AM      Component Value Range Status Comment   Specimen Description BLOOD LEFT ARM   Final    Special Requests     Final    Value: BOTTLES DRAWN AEROBIC ONLY 10CC PATIENT ON FOLLOWING CIPRO   Culture  Setup Time [id number]   Final    Culture     Final    Value:        BLOOD CULTURE RECEIVED NO GROWTH TO DATE CULTURE WILL BE HELD FOR 5 DAYS BEFORE ISSUING A FINAL NEGATIVE REPORT   Report Status PENDING   Incomplete   CULTURE, BLOOD (ROUTINE X 2)     Status: Normal (Preliminary result)   Collection Time   08/11/11  9:40 AM      Component Value Range Status Comment   Specimen Description BLOOD LEFT HAND   Final    Special Requests     Final    Value: BOTTLES DRAWN AEROBIC AND ANAEROBIC 10CC BLUE,5CC RED PATIENT ON FOLLOWING CIPRO   Culture  Setup Time [id number]   Final    Culture     Final    Value:  BLOOD CULTURE RECEIVED NO GROWTH TO DATE CULTURE WILL BE HELD FOR 5 DAYS BEFORE ISSUING A FINAL NEGATIVE REPORT   Report Status PENDING   Incomplete   CULTURE, BLOOD (ROUTINE X 2)     Status: Normal (Preliminary result)   Collection Time   08/11/11  9:04 PM      Component Value Range Status Comment   Specimen Description BLOOD LEFT HAND   Final    Special Requests BOTTLES DRAWN AEROBIC ONLY 10CC   Final    Culture  Setup Time [id number]   Final    Culture     Final    Value:        BLOOD CULTURE RECEIVED NO GROWTH TO DATE CULTURE WILL BE HELD FOR 5 DAYS BEFORE ISSUING A FINAL NEGATIVE REPORT   Report Status PENDING   Incomplete   CULTURE, BLOOD (ROUTINE X 2)     Status: Normal (Preliminary result)   Collection Time   08/11/11  9:23 PM      Component Value Range Status Comment   Specimen Description BLOOD RIGHT HAND   Final    Special Requests BOTTLES DRAWN AEROBIC ONLY [facility]   Final    Culture  Setup Time  [id number]   Final    Culture     Final    Value:        BLOOD CULTURE RECEIVED NO GROWTH TO DATE CULTURE WILL BE HELD FOR 5 DAYS BEFORE ISSUING A FINAL NEGATIVE REPORT   Report Status PENDING   Incomplete   URINE CULTURE     Status: Normal   Collection Time   08/12/11  4:32 AM      Component Value Range Status Comment   Specimen Description URINE, CATHETERIZED   Final    Special Requests NONE   Final    Culture  Setup Time [id number]   Final    Colony Count NO GROWTH   Final    Culture NO GROWTH   Final    Report Status 08/13/2011 FINAL   Final     Antibiotics: Anti-infectives     Start     Dose/Rate Route Frequency Ordered Stop   08/13/11 1000   vancomycin (VANCOCIN) 500 mg in sodium chloride 0.9 % 100 mL IVPB        500 mg 100 mL/hr over 60 Minutes Intravenous Every 12 hours 08/13/11 0929     08/12/11 1600   vancomycin (VANCOCIN) IVPB 1000 mg/200 mL premix  Status:  Discontinued        1,000 mg 200 mL/hr over 60 Minutes Intravenous Every 24 hours 08/11/11 2107 08/13/11 0929   08/12/11 0000   piperacillin-tazobactam (ZOSYN) IVPB 3.375 g        3.375 g 12.5 mL/hr over 240 Minutes Intravenous 3 times per day 08/11/11 2107     08/11/11 2200   levofloxacin (LEVAQUIN) IVPB 750 mg  Status:  Discontinued        750 mg 100 mL/hr over 90 Minutes Intravenous Every 24 hours 08/11/11 1420 08/11/11 2048   08/11/11 2100   piperacillin-tazobactam (ZOSYN) IVPB 3.375 g  Status:  Discontinued        3.375 g 100 mL/hr over 30 Minutes Intravenous  Once 08/11/11 2048 08/11/11 2103   08/11/11 2100   vancomycin (VANCOCIN) IVPB 1000 mg/200 mL premix  Status:  Discontinued        1,000 mg 200 mL/hr over 60 Minutes Intravenous  Once 08/11/11 2048 08/11/11 2049   08/11/11 1600  piperacillin-tazobactam (ZOSYN) IVPB 3.375 g  Status:  Discontinued        3.375 g 12.5 mL/hr over 240 Minutes Intravenous Every 8 hours 08/11/11 1420 08/11/11 2048   08/11/11 1500   vancomycin (VANCOCIN) IVPB 1000  mg/200 mL premix  Status:  Discontinued        1,000 mg 200 mL/hr over 60 Minutes Intravenous Every 24 hours 08/11/11 1420 08/11/11 2048   08/08/11 1430   ciprofloxacin (CIPRO) tablet 500 mg  Status:  Discontinued        500 mg Oral 2 times daily 08/08/11 1345 08/11/11 1344          Tests / Events: Chest CT 4/3>>new pneumothrax  4/7 CT out CXR 4/8>>right lower lobe opacity 08/12/11 > start 48h nimbex for severe ARDS 08/14/11 >>CT chest 08/14/2011 >> RLL consolidation with loculated small effusion (does not warrant Chest tue per CVTS), Some ARDS bilateral LL, and very small left ptx (new)    Consults: CTCS, TRH   Subjective/Overnight/Interval History:   50% fio2/peep 8. Coming off nimbex today. Family at bedside and significant emotional distress   Vital Signs: Temp:  [98.6 F (37 C)-99.5 F (37.5 C)] 99.3 F (37.4 C) (04/11 0800) Pulse Rate:  [93-122] 108  (04/11 1500) Resp:  [25-32] 32  (04/11 1500) BP: (88-129)/(44-76) 101/48 mmHg (04/11 1500) SpO2:  [94 %-100 %] 94 % (04/11 1500) Arterial Line BP: (60-115)/(51-107) 63/60 mmHg (04/11 1100) FiO2 (%):  [49.8 %-100 %] 50 % (04/11 1539) Weight:  [49.8 kg (109 lb 12.6 oz)] 49.8 kg (109 lb 12.6 oz) (04/11 0300) I/O last 3 completed shifts: In: 3964.1 [I.V.:2114.6; NG/GT:750; IV Piggyback:1099.5] Out: 2360 [Urine:2360]  Physical Examination: Last febrile at 1440 on 4/8 General: no acute distress Neuro:  Awake, follows commands, +corneal and pupillary reflex HEENT: Intubated   Cardiovascular:  tachycardic Lungs:   improved aeration b/l, Breath sounds on R < L Abdomen:  Soft, NT/ND, normoactive BS   Ventilator settings: Vent Mode:  [-] PRVC FiO2 (%):  [49.8 %-100 %] 50 % Set Rate:  [32 bmp] 32 bmp Vt Set:  [340 mL] 340 mL PEEP:  [8 cmH20] 8 cmH20 Plateau Pressure:  [20 cmH20-22 cmH20] 22 cmH20  Labs/Radiology: Please see A and P for full labs  Ct Chest Wo Contrast  08/14/2011  *RADIOLOGY REPORT*  Clinical Data:  History of right pleural effusion.  Question loculation.  Pneumothorax.  CT CHEST WITHOUT CONTRAST  Technique:  Multidetector CT imaging of the chest was performed following the standard protocol without IV contrast.  Comparison: Multiple prior chest radiographs and CT chest 08/06/2011.  Findings: Mediastinal lymph nodes are not enlarged by CT size criteria.  Hilar regions are difficult to definitively evaluate without IV contrast.  No axillary adenopathy.  Endotracheal tube is in satisfactory position.  Heart size normal.  No pericardial effusion.  There is a partially loculated small to moderate right pleural effusion with collapse/consolidation in the right middle and right lower lobes.  Postoperative changes are seen in the medial aspect of the lower right hemithorax, adjacent to focal areas of lucency (images 14 to 26).  Lucencies correspond to blebs and pleural air on 08/06/2011.  Patchy ground-glass is seen diffusely.  Blebs and small left pneumothorax in the left hemithorax.  Airspace consolidation in the lingula and left lower lobe.  Incidental imaging of the upper abdomen shows no acute findings. No worrisome lytic or sclerotic lesions.  IMPRESSION:  1.  New partially loculated small to moderate right pleural  effusion, worrisome for empyema. 2.  Complete or near complete resolution of right pneumothorax, with a small amount of pleural air and/or blebs in the medial aspect of the lower right hemithorax, as before. 3.  Multilobar collapse/consolidation and ground-glass, most indicative of pneumonia. 4.  Small left pneumothorax.  Original Report Authenticated By: Reyes Ivan, M.D.   Dg Chest Port 1 View  08/13/2011  *RADIOLOGY REPORT*  Clinical Data: ARDS.  Ventilator.  PORTABLE CHEST - 1 VIEW  Comparison: Multiple priors, most recently 08/12/2011.  Findings: An endotracheal tube is in place with tip 2.4 cm above the carina.  A nasogastric tube is coiled upon itself with tip in the body of the  stomach.  There is a right-sided internal jugular central venous catheter with tip terminating in the distal superior vena cava. Lung volumes are low.  There continue to be diffuse interstitial and patchy airspace opacities throughout the lungs bilaterally, unchanged compared to recent prior examinations, which may reflect underlying pulmonary edema and/or multifocal infection. Probable small left-sided pleural effusion and moderate right-sided pleural effusions are unchanged.  IMPRESSION: 1.  Support apparatus, as above. 2.  Multifocal interstitial and airspace opacities redemonstrated which may reflect edema and/or multifocal infection. 3.  Moderate right-sided and small left-sided pleural effusions are unchanged.  Original Report Authenticated By: Florencia Reasons, M.D.   Dg Chest Port 1 View  08/12/2011  *RADIOLOGY REPORT*  Clinical Data: Endotracheal tube position.  Respiratory failure.  PORTABLE CHEST - 1 VIEW  Comparison: Earlier today at 1040 hours.  Findings: 1930 hours.  Endotracheal tube is appropriately positioned, 3.1 cm above carina.  Right IJ central line unchanged. Nasogastric tube into the proximal stomach.  Mild cardiomegaly.  Right sided pleural effusion is moderate and slightly increased. No pneumothorax.  Worsened interstitial and airspace disease, lower lobe predominant.  IMPRESSION:  1.  Worsened aeration, secondary to progressive interstitial edema. 2.  Increased moderate right pleural effusion. 3.  Lower lobe predominant airspace disease, either atelectasis or infection. 4.  Endotracheal tube unchanged, 3.1 cm above carina.  Original Report Authenticated By: Consuello Bossier, M.D.    ASSESSMENT AND PLAN  RESPIRATORY  Lab 08/13/11 1049 08/13/11 0449 08/12/11 2109 08/12/11 1842 08/12/11 1732  PHART 7.456* 7.464* 7.384 7.313* 7.204*  PCO2ART 38.3 35.7 43.3 54.4* 61.1*  PO2ART 56.0* 65.0* 72.0* 107.0* 137.0*  HCO3 27.0* 25.6* 25.9* 27.2* 23.5  O2SAT 90.0 94.0 94.0 97.0 98.0   A:    Acute hypoxemic resp failure/ARDS d/t HCAP v UTI.   - RLL consolidation and effusion CT chest 08/14/11  - Sliver of new small ptx on ct chest 08/14/11  P:  Full mech support with ARDS protocol;   DC nimbex (neuromuscular blockade protocol) 08/14/2011 - 48h course complete No need for chest tube for r loculated effusion per CVTS Monitor for worsening ptx on left; repeat cxr at 9pm   CARDIAC  Lab 08/12/11 1000 08/11/11 2129  TROPONINI -- <0.30  PROBNP 297.7* --   A:  No acute issues. P: Continue to monitor  NEUROLOGIC A: Deeply sedated and paralyzed since 4/9 due to ARDS protocol and 48h nimbex P: d/c paralytic today  Daily WUA only with family present (due to family concern about her stress during wua)  VASCULAR/HEMODYNAMIC A:  Pt remains slightly hypotensive but ? A line error      Left arm swelling, pt on proph heparin,  P:  Continue to monitor, and target CVP <6 if tolerated.   will plan to check  Doppler to r/o DVT.    INFECTIOUS DISEASE  Lab 08/14/11 0730 08/13/11 0430 08/12/11 0421 08/11/11 2129 08/11/11 0935  WBC 7.5 7.6 13.1* 16.3* 15.9*  PROCALCITON -- 3.02 -- 2.09 --   A:  WBC trending down; infectious etiology likely multifocal HCAP PNA given last night's CXR, AM CXR report pending. Off pressors P: Continue vanc/zosyn, goal to stop abx on 4/17   RENAL  Lab 08/14/11 0730 08/13/11 0430 08/12/11 0421 08/11/11 2129 08/11/11 1502  BUN 22 14 16 17 19   CREATININE 0.99 0.78 0.73 0.79 0.78   I/O last 3 completed shifts: In: 3964.1 [I.V.:2114.6; NG/GT:750; IV Piggyback:1099.5] Out: 2360 [Urine:2360] A:  No acute issues. P: Continue to monitor  ELECTROLYTES  Lab 08/14/11 0730 08/13/11 0430 08/12/11 0421 08/11/11 2129 08/11/11 1502  NA 139 134* 133* 131* 133*  K 3.3* 3.2* -- -- --  CL 101 99 100 97 100  CO2 28 28 23 23 23   BUN 22 14 16 17 19   CREATININE 0.99 0.78 0.73 0.79 0.78  CALCIUM 8.4 8.5 9.0 9.3 9.9  MG -- -- 1.8 -- --  PHOS -- -- -- -- --   A:  mildly hyponatremic (improving), possibly d/t volume overload, and hypokalemia this AM P: Increase lasix to 40mg  daily, and given KCL per tube x1. Check AM BMET, Mg and Po4  HEMATOLOGIC  Lab 08/14/11 0730 08/13/11 0430 08/12/11 0421 08/11/11 2129 08/11/11 0935  HGB 8.8* 9.1* 10.7* 11.3* 12.0  HCT 27.3* 27.8* 32.4* 34.2* 36.4  PLT 281 229 278 283 304  INR -- -- -- -- --  APTT -- -- -- -- --   A:  Anemia of critical illness P:PRBC for hgb < 7gm% unless actively bleeding or in MI  GASTROINTESTINAL  Lab 08/11/11 2129  AST 21  ALT 11  ALKPHOS 85  BILITOT 0.6  PROT 6.6  ALBUMIN 2.7*   A:  Hypoalbuminemic, likely d/t chronic disease and probable decreased PO intake P: Continue TF  ENDOCRINE  Lab 08/11/11 1847  GLUCAP 105*  TSH --  CORTISOL --   A:  No active issues. Random cortisol was 17.6 (21:29), which is slightly above PM range as expected. P: Continue to monitor  DERMATOLOGY A: No active issues. P: Continue to monitor.  SYMPTOMS: At this time, patient has no complaints.   GLOBAL: Full code. family updated about patients condition at bedside. 08/14/2011   Detailed family discussion of Dr Marchelle Gearing 11.40 am to 12.30pm with younger sister, her 2 kids, niece, dad and mom on 08/14/11    Patient: described as happy, smart and fighting spirit during her childhood and then in puberty picked up smoking out of peer pressure due to handicap from spina bifida. 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 Patient a witness to that and. 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 Loves her pet chihuahua. Does not like hospitals and feels lonely.    Family: they feel anger at her for smoking and abusing drugs. They feel that ptx is  due to smoking and also ARDS. They feel helpless. They warned her but feel patient did this to herself. They do feel that she can fight through this.  They are in significant emotional distress. They are concerned about impact of WUA  without family members present. They do not understand why life has been unfair to patient including spina bifida, then drug abuse and now recurrent  pneumothorax and now ARDS. They are searching for meaning   Discussion: Validated emotions. Listened. Full Code. Guarded optimism but poor long drawn out prognosis of ARDS explained. Will look into getting VEGF for assessing LAM. Patient pet dog to be allowed at bedside.Spiritual support offered but they are utilizing from their own church. WUA to be done only with family present  Darnelle Maffucci, MD Pager: [phone] 08/14/2011  STAFF NOTE: I, Dr Lavinia Sharps have personally reviewed patient's available data, including medical history, events of note, physical examination and test results as part of my evaluation. I have discussed with resident/NP and other care providers such as pharmacist, RN and RRT.  In addition,  I personally evaluated patient and elicited key findings of ARDS with some improvement since intubation. Will dc nimbex today. Cause UTI or HCAP. Detailed family meeting. Sliver of left ptx - will follow with serial cxr.  Rest per NP/medical resident whose note is outlined above and that I agree with  The patient is critically ill with multiple organ systems failure and requires high complexity decision making for assessment and support, frequent evaluation and titration of therapies, application of advanced monitoring technologies and extensive interpretation of multiple databases.   Critical Care Time devoted to patient care services described in this note is  106  Minutes.  Dr. Kalman Shan, M.D., [facility].C.P Pulmonary and Critical Care Medicine Staff Physician [facility] Pager: [phone], If no answer or between  15:00h - 7:00h: call [phone]  08/14/2011 4:10 PM

## 2011-08-14 NOTE — Progress Notes (Signed)
Left upper extremity venous duplex completed.  Preliminary report is negative for DVT.  There appears to be SVT in the left cephalic vein.

## 2011-08-14 NOTE — Progress Notes (Signed)
UR Completed.  Kartik Fernando [name] [phone] 08/14/2011  

## 2011-08-14 NOTE — Progress Notes (Signed)
   Brief  PCCM PROGRESS NOTE  Nurse reports that patient has not had bowel movement for 6 days. Patient seems to have abdominal pain. I exammed her. Her abdomen is distended and tender to palpation. Bowel sound is quiet. I suspect that she may have ileus. Will get abdomen-Xray.     Lorretta Harp, MD PGY1, Internal Medicine Teaching Service Pager: [phone]

## 2011-08-14 NOTE — Progress Notes (Signed)
TCTS BRIEF PROGRESS NOTE   Clinically stable.  CT scan reveals small-moderate right pleural effusion with consolidation and air bronchograms in right lower lobe.  I do not feel that chest tube nor VATS is indicated at this time.  CT or US guided thoracentesis or chest tube placement could be considered for therapeutic purposes if the pleural effusion gets bigger or if the patient doesn't continue to improve clinically.  Xitlaly Ault H 08/14/2011 1:19 PM

## 2011-08-15 ENCOUNTER — Inpatient Hospital Stay (HOSPITAL_COMMUNITY): Payer: Medicare Other

## 2011-08-15 LAB — CBC
HCT: 26.7 % — ABNORMAL LOW (ref 36.0–46.0)
MCV: 94.3 fL (ref 78.0–100.0)
Platelets: 314 10*3/uL (ref 150–400)
RBC: 2.83 MIL/uL — ABNORMAL LOW (ref 3.87–5.11)
RDW: 16.3 % — ABNORMAL HIGH (ref 11.5–15.5)
WBC: 8.9 10*3/uL (ref 4.0–10.5)

## 2011-08-15 LAB — BASIC METABOLIC PANEL
CO2: 32 mEq/L (ref 19–32)
Chloride: 104 mEq/L (ref 96–112)
GFR calc Af Amer: 75 mL/min — ABNORMAL LOW (ref >90)
Potassium: 3.9 mEq/L (ref 3.5–5.1)

## 2011-08-15 LAB — POCT I-STAT 3, ART BLOOD GAS (G3+)
Acid-Base Excess: 8 mmol/L — ABNORMAL HIGH (ref 0.0–2.0)
Bicarbonate: 33 mEq/L — ABNORMAL HIGH (ref 20.0–24.0)
Patient temperature: 100
TCO2: 34 mmol/L (ref 0–100)
pH, Arterial: 7.433 — ABNORMAL HIGH (ref 7.350–7.400)

## 2011-08-15 LAB — VANCOMYCIN, TROUGH: Vancomycin Tr: 17 ug/mL (ref 10.0–20.0)

## 2011-08-15 LAB — PHOSPHORUS: Phosphorus: 4 mg/dL (ref 2.3–4.6)

## 2011-08-15 MED ORDER — FUROSEMIDE 10 MG/ML IJ SOLN
INTRAMUSCULAR | Status: AC
  Administered 2011-08-15: 80 mg via INTRAVENOUS
  Filled 2011-08-15: qty 4

## 2011-08-15 MED ORDER — MIDAZOLAM HCL 2 MG/2ML IJ SOLN
1.0000 mg | Freq: Once | INTRAMUSCULAR | Status: AC
  Administered 2011-08-15: 1 mg via INTRAVENOUS

## 2011-08-15 MED ORDER — FENTANYL CITRATE 0.05 MG/ML IJ SOLN
12.5000 ug | INTRAMUSCULAR | Status: DC | PRN
  Administered 2011-08-15 – 2011-08-20 (×16): 25 ug via INTRAVENOUS
  Filled 2011-08-15 (×20): qty 2

## 2011-08-15 MED ORDER — FUROSEMIDE 80 MG PO TABS
80.0000 mg | ORAL_TABLET | Freq: Two times a day (BID) | ORAL | Status: DC
  Administered 2011-08-15 – 2011-08-16 (×3): 80 mg via ORAL
  Filled 2011-08-15 (×6): qty 1

## 2011-08-15 MED ORDER — FUROSEMIDE 8 MG/ML PO SOLN
80.0000 mg | Freq: Two times a day (BID) | ORAL | Status: DC
  Filled 2011-08-15 (×3): qty 10

## 2011-08-15 MED ORDER — MIDAZOLAM HCL 2 MG/2ML IJ SOLN
INTRAMUSCULAR | Status: AC
Start: 2011-08-15 — End: 2011-08-16
  Filled 2011-08-15: qty 2

## 2011-08-15 MED ORDER — FENTANYL CITRATE 0.05 MG/ML IJ SOLN: 100.0000 ug | INTRAMUSCULAR | Status: DC | PRN

## 2011-08-15 MED ORDER — PANTOPRAZOLE SODIUM 40 MG PO TBEC
40.0000 mg | DELAYED_RELEASE_TABLET | Freq: Every day | ORAL | Status: DC
  Administered 2011-08-15 – 2011-08-20 (×6): 40 mg via ORAL
  Filled 2011-08-15 (×5): qty 1

## 2011-08-15 MED ORDER — FUROSEMIDE 10 MG/ML IJ SOLN
40.0000 mg | Freq: Two times a day (BID) | INTRAMUSCULAR | Status: DC
  Administered 2011-08-15: 80 mg via INTRAVENOUS

## 2011-08-15 MED ORDER — FUROSEMIDE 10 MG/ML IJ SOLN
40.0000 mg | Freq: Two times a day (BID) | INTRAMUSCULAR | Status: DC
  Administered 2011-08-15: 40 mg via INTRAVENOUS

## 2011-08-15 NOTE — Progress Notes (Signed)
Name: Rhonda Ramos DOB: July 13, 1965 MRN: [id number] PCP: Rene Paci, MD, MD ADMIT DATE: 08/06/2011 LOS: 9  PCCM PROGRESS NOTE  Brief Patient Profile: Patient is a 46yo F with h/o chronic pain syndrome related to spina bifida & scoliosis admitted on 08/06/11 to [facility] for increasing SOB. In Feb 2013, she underwent VATS for stapling of blebs with mech pleurodesis by Dr. Tyrone Sage. Patient was found to have right spontaneous pneumothorax and a right sided chest tube was placed by Dr. Sharlene Motts on 4/3. Patient had urinary tract infection diagnosed on 4/5 and was started on Cipro. Patient started having fever and chills starting yesterday (4/7). Fever was as high as 101 F on 4/7. A CXR was obtained which showed bibasilar atelactasis on 4/7. Chest tube was taken out on 4/7. On 4/8 patient continued to have fever along with developing shortness of breath for which she was placed on non rebreather and ABG was obtained. PCCM was consulted when patient continued to do worse on non-rebreather and tranferred to ICU, is currently on ARDS protocol.   Best Practice: DVT: Lovenox SUP: Protonix Nutrition: Tube feeds planned 08/13/2011 Glycemic control: None Sedation/analgesia: continuous fentanyl with prn versed  Lines / Drains: Right IJ 4/8>>>  ETT 4/8>>> 08/15/11 Aline >. ? Date >> 08/14/2011  Micro:  Lab 08/13/11 0430 08/11/11 2129  LATICACIDVEN 0.7 0.9   Recent Results (from the past 240 hour(s))  URINE CULTURE     Status: Normal   Collection Time   08/06/11 10:54 PM      Component Value Range Status Comment   Specimen Description URINE, CATHETERIZED   Final    Special Requests Normal   Final    Culture  Setup Time [id number]   Final    Colony Count >=100,000 COLONIES/ML   Final    Culture ESCHERICHIA COLI   Final    Report Status 08/08/2011 FINAL   Final    Organism ID, Bacteria ESCHERICHIA COLI   Final   MRSA PCR SCREENING     Status: Abnormal   Collection Time   08/07/11  9:07 AM      Component  Value Range Status Comment   MRSA by PCR POSITIVE (*) NEGATIVE  Final   CULTURE, BLOOD (ROUTINE X 2)     Status: Normal (Preliminary result)   Collection Time   08/11/11  9:35 AM      Component Value Range Status Comment   Specimen Description BLOOD LEFT ARM   Final    Special Requests     Final    Value: BOTTLES DRAWN AEROBIC ONLY 10CC PATIENT ON FOLLOWING CIPRO   Culture  Setup Time [id number]   Final    Culture     Final    Value:        BLOOD CULTURE RECEIVED NO GROWTH TO DATE CULTURE WILL BE HELD FOR 5 DAYS BEFORE ISSUING A FINAL NEGATIVE REPORT   Report Status PENDING   Incomplete   CULTURE, BLOOD (ROUTINE X 2)     Status: Normal (Preliminary result)   Collection Time   08/11/11  9:40 AM      Component Value Range Status Comment   Specimen Description BLOOD LEFT HAND   Final    Special Requests     Final    Value: BOTTLES DRAWN AEROBIC AND ANAEROBIC 10CC BLUE,5CC RED PATIENT ON FOLLOWING CIPRO   Culture  Setup Time [id number]   Final    Culture     Final    Value:  BLOOD CULTURE RECEIVED NO GROWTH TO DATE CULTURE WILL BE HELD FOR 5 DAYS BEFORE ISSUING A FINAL NEGATIVE REPORT   Report Status PENDING   Incomplete   CULTURE, BLOOD (ROUTINE X 2)     Status: Normal (Preliminary result)   Collection Time   08/11/11  9:04 PM      Component Value Range Status Comment   Specimen Description BLOOD LEFT HAND   Final    Special Requests BOTTLES DRAWN AEROBIC ONLY 10CC   Final    Culture  Setup Time [id number]   Final    Culture     Final    Value:        BLOOD CULTURE RECEIVED NO GROWTH TO DATE CULTURE WILL BE HELD FOR 5 DAYS BEFORE ISSUING A FINAL NEGATIVE REPORT   Report Status PENDING   Incomplete   CULTURE, BLOOD (ROUTINE X 2)     Status: Normal (Preliminary result)   Collection Time   08/11/11  9:23 PM      Component Value Range Status Comment   Specimen Description BLOOD RIGHT HAND   Final    Special Requests BOTTLES DRAWN AEROBIC ONLY [facility]   Final    Culture  Setup Time  [id number]   Final    Culture     Final    Value:        BLOOD CULTURE RECEIVED NO GROWTH TO DATE CULTURE WILL BE HELD FOR 5 DAYS BEFORE ISSUING A FINAL NEGATIVE REPORT   Report Status PENDING   Incomplete   URINE CULTURE     Status: Normal   Collection Time   08/12/11  4:32 AM      Component Value Range Status Comment   Specimen Description URINE, CATHETERIZED   Final    Special Requests NONE   Final    Culture  Setup Time [id number]   Final    Colony Count NO GROWTH   Final    Culture NO GROWTH   Final    Report Status 08/13/2011 FINAL   Final     Antibiotics: Anti-infectives     Start     Dose/Rate Route Frequency Ordered Stop   08/13/11 1000   vancomycin (VANCOCIN) 500 mg in sodium chloride 0.9 % 100 mL IVPB        500 mg 100 mL/hr over 60 Minutes Intravenous Every 12 hours 08/13/11 0929     08/12/11 1600   vancomycin (VANCOCIN) IVPB 1000 mg/200 mL premix  Status:  Discontinued        1,000 mg 200 mL/hr over 60 Minutes Intravenous Every 24 hours 08/11/11 2107 08/13/11 0929   08/12/11 0000  piperacillin-tazobactam (ZOSYN) IVPB 3.375 g       3.375 g 12.5 mL/hr over 240 Minutes Intravenous 3 times per day 08/11/11 2107     08/11/11 2200   levofloxacin (LEVAQUIN) IVPB 750 mg  Status:  Discontinued        750 mg 100 mL/hr over 90 Minutes Intravenous Every 24 hours 08/11/11 1420 08/11/11 2048   08/11/11 2100   piperacillin-tazobactam (ZOSYN) IVPB 3.375 g  Status:  Discontinued        3.375 g 100 mL/hr over 30 Minutes Intravenous  Once 08/11/11 2048 08/11/11 2103   08/11/11 2100   vancomycin (VANCOCIN) IVPB 1000 mg/200 mL premix  Status:  Discontinued        1,000 mg 200 mL/hr over 60 Minutes Intravenous  Once 08/11/11 2048 08/11/11 2049   08/11/11 1600   piperacillin-tazobactam (  ZOSYN) IVPB 3.375 g  Status:  Discontinued        3.375 g 12.5 mL/hr over 240 Minutes Intravenous Every 8 hours 08/11/11 1420 08/11/11 2048   08/11/11 1500   vancomycin (VANCOCIN) IVPB 1000  mg/200 mL premix  Status:  Discontinued        1,000 mg 200 mL/hr over 60 Minutes Intravenous Every 24 hours 08/11/11 1420 08/11/11 2048   08/08/11 1430   ciprofloxacin (CIPRO) tablet 500 mg  Status:  Discontinued        500 mg Oral 2 times daily 08/08/11 1345 08/11/11 1344          Tests / Events: Chest CT 4/3>>new pneumothrax  4/7 CT out CXR 4/8>>right lower lobe opacity 08/12/11 > start 48h nimbex for severe ARDS 08/14/11 >>CT chest 08/14/2011 >> RLL consolidation with loculated small effusion (does not warrant Chest tue per CVTS), Some ARDS bilateral LL, and very small left ptx (new)    Consults: CTCS, TRH  Subjective/Overnight/Interval History: Pt partially arousable, open eyes, blinks to command, but does not move extremities. No major issues overnight.   Staff note: met extubation criteria ane extubated   Vital Signs: Temp:  [98.9 F (37.2 C)-100.3 F (37.9 C)] 100 F (37.8 C) (04/12 0401) Pulse Rate:  [95-122] 105  (04/12 0600) Resp:  [26-36] 31  (04/12 0600) BP: (87-124)/(44-73) 106/56 mmHg (04/12 0600) SpO2:  [94 %-99 %] 96 % (04/12 0600) Arterial Line BP: (60-63)/(57-60) 63/60 mmHg (04/11 1100) FiO2 (%):  [49.8 %-100 %] 49.8 % (04/12 0600) Weight:  [110 lb 10.7 oz (50.2 kg)] 110 lb 10.7 oz (50.2 kg) (04/12 0500) I/O last 3 completed shifts: In: 3298.6 [I.V.:1332.1; NG/GT:1450; IV Piggyback:516.5] Out: 3000 [Urine:3000]  Physical Examination: Last febrile at 1440 on 4/8 General: no acute distress Neuro:  Awake, follows commands, +corneal and pupillary reflex HEENT: Intubated   Cardiovascular:  tachycardic Lungs:   improved aeration b/l, Breath sounds on R < L Abdomen:  Soft, NT/ND, normoactive BS   Ventilator settings: Vent Mode:  [-] PRVC FiO2 (%):  [49.8 %-100 %] 49.8 % Set Rate:  [32 bmp] 32 bmp Vt Set:  [340 mL] 340 mL PEEP:  [8 cmH20] 8 cmH20 Plateau Pressure:  [18 cmH20-22 cmH20] 18 cmH20  Labs/Radiology: Please see A and P for full  labs  Ct Chest Wo Contrast  08/14/2011  *RADIOLOGY REPORT*  Clinical Data: History of right pleural effusion.  Question loculation.  Pneumothorax.  CT CHEST WITHOUT CONTRAST  Technique:  Multidetector CT imaging of the chest was performed following the standard protocol without IV contrast.  Comparison: Multiple prior chest radiographs and CT chest 08/06/2011.  Findings: Mediastinal lymph nodes are not enlarged by CT size criteria.  Hilar regions are difficult to definitively evaluate without IV contrast.  No axillary adenopathy.  Endotracheal tube is in satisfactory position.  Heart size normal.  No pericardial effusion.  There is a partially loculated small to moderate right pleural effusion with collapse/consolidation in the right middle and right lower lobes.  Postoperative changes are seen in the medial aspect of the lower right hemithorax, adjacent to focal areas of lucency (images 14 to 26).  Lucencies correspond to blebs and pleural air on 08/06/2011.  Patchy ground-glass is seen diffusely.  Blebs and small left pneumothorax in the left hemithorax.  Airspace consolidation in the lingula and left lower lobe.  Incidental imaging of the upper abdomen shows no acute findings. No worrisome lytic or sclerotic lesions.  IMPRESSION:  1.  New partially loculated small to moderate right pleural effusion, worrisome for empyema. 2.  Complete or near complete resolution of right pneumothorax, with a small amount of pleural air and/or blebs in the medial aspect of the lower right hemithorax, as before. 3.  Multilobar collapse/consolidation and ground-glass, most indicative of pneumonia. 4.  Small left pneumothorax.  Original Report Authenticated By: Reyes Ivan, M.D.   Dg Chest Port 1 View  08/15/2011  *RADIOLOGY REPORT*  Clinical Data: Evaluate left-sided pneumothorax.  PORTABLE CHEST - 1 VIEW  Comparison: 08/14/2011 CT and 08/13/2011 chest x-ray.  Findings: The CT detected left pneumothorax is not as well  delineated on the present plain film exam.  The subtle lucency in the left costophrenic angle region may reflect the CT detected pneumothorax.  Bullae/bleb superior medial aspect left lung limiting evaluation in this region for detection of small pneumothorax.  Right base consolidation with loculated hydropneumothorax.  Right central line tip proximal superior vena cava.  Nasogastric tube courses below the diaphragm.  The tip is not included on this exam.  Endotracheal tube tip 7.2 cm above the carina.  IMPRESSION: CT detected left-sided pneumothorax and right-sided hydropneumothorax better demonstrated on the CT and poorly delineated by plain film exam.  Original Report Authenticated By: Fuller Canada, M.D.   Dg Chest Port 1 View  08/13/2011  *RADIOLOGY REPORT*  Clinical Data: ARDS.  Ventilator.  PORTABLE CHEST - 1 VIEW  Comparison: Multiple priors, most recently 08/12/2011.  Findings: An endotracheal tube is in place with tip 2.4 cm above the carina.  A nasogastric tube is coiled upon itself with tip in the body of the stomach.  There is a right-sided internal jugular central venous catheter with tip terminating in the distal superior vena cava. Lung volumes are low.  There continue to be diffuse interstitial and patchy airspace opacities throughout the lungs bilaterally, unchanged compared to recent prior examinations, which may reflect underlying pulmonary edema and/or multifocal infection. Probable small left-sided pleural effusion and moderate right-sided pleural effusions are unchanged.  IMPRESSION: 1.  Support apparatus, as above. 2.  Multifocal interstitial and airspace opacities redemonstrated which may reflect edema and/or multifocal infection. 3.  Moderate right-sided and small left-sided pleural effusions are unchanged.  Original Report Authenticated By: Florencia Reasons, M.D.   Dg Abd Portable 1v  08/14/2011  *RADIOLOGY REPORT*  Clinical Data: Evaluate for possible ileus.  PORTABLE ABDOMEN -  1 VIEW  Comparison: Abdominal radiograph 08/11/2011.  Findings: A nasogastric tube is seen coiled within the stomach. There is some gas and stool seen scattered throughout the colon extending to the level of the distal rectum.  No pathologic distension of small bowel is appreciated.  There is a paucity of bowel gas throughout the right side of the abdomen.  IMPRESSION: 1.  Nonspecific bowel gas pattern, as above, not consistent with underlying ileus.  Original Report Authenticated By: Florencia Reasons, M.D.    ASSESSMENT AND PLAN  RESPIRATORY  Lab 08/15/11 0505 08/13/11 1049 08/13/11 0449 08/12/11 2109 08/12/11 1842  PHART 7.433* 7.456* 7.464* 7.384 7.313*  PCO2ART 49.7* 38.3 35.7 43.3 54.4*  PO2ART 75.0* 56.0* 65.0* 72.0* 107.0*  HCO3 33.0* 27.0* 25.6* 25.9* 27.2*  O2SAT 94.0 90.0 94.0 94.0 97.0   A:   Acute hypoxemic resp failure/ARDS d/t HCAP v UTI.   Hx of RECURRENT PTX  - RLL consolidation and effusion CT chest 08/14/11  - Left basal new small ptx on ct chest 08/14/11 and stable on cxr 08/15/11 P:  Extubation today.  No need for chest tube for R loculated effusion per CVTS and left ptx per CVTS Dr Cornelius Moras Monitor for worsening ptx on left; repeat cxr at 9pm  D/c sedation, and start PRN Fentanyl for pain control. Check VEGF - need to contact Dr Conception Chancy at [facility] to rule out LAM   CARDIAC  Lab 08/12/11 1000 08/11/11 2129  TROPONINI -- <0.30  PROBNP 297.7* --   A:  No acute issues. P: Continue to monitor  NEUROLOGIC A: RASS 0. CAM-ICU neg for delirium P: prn fentanyl.   VASCULAR/HEMODYNAMIC A:  Pt remains slightly hypotensive, Left cephalic thrombosis.       Left arm swelling, pt on proph heparin,  P:  Continue to monitor, and target CVP <6 if tolerated.   Warm compression for cephalic vein thrombus and arm eleveation, DC IV on left arm.    INFECTIOUS DISEASE  Lab 08/15/11 0420 08/14/11 0730 08/13/11 0430 08/12/11 0421 08/11/11 2129  WBC 8.9 7.5 7.6  13.1* 16.3*  PROCALCITON -- -- 3.02 -- 2.09   A:  WBC trending down; infectious etiology likely multifocal HCAP PNA given last night's CXR, AM CXR report pending. Off pressors P: Continue vanc/zosyn, goal to stop abx on 4/17   RENAL  Lab 08/15/11 0420 08/14/11 0730 08/13/11 0430 08/12/11 0421 08/11/11 2129  BUN 26* 22 14 16 17   CREATININE 1.04 0.99 0.78 0.73 0.79   I/O last 3 completed shifts: In: 3298.6 [I.V.:1332.1; NG/GT:1450; IV Piggyback:516.5] Out: 3000 [Urine:3000] A:  No acute issues. P: Continue to monitor  ELECTROLYTES  Lab 08/15/11 0420 08/14/11 0730 08/13/11 0430 08/12/11 0421 08/11/11 2129  NA 144 139 134* 133* 131*  K 3.9 3.3* -- -- --  CL 104 101 99 100 97  CO2 32 28 28 23 23   BUN 26* 22 14 16 17   CREATININE 1.04 0.99 0.78 0.73 0.79  CALCIUM 8.8 8.4 8.5 9.0 9.3  MG 2.1 -- -- 1.8 --  PHOS 4.0 -- -- -- --   A: mildly hyponatremic (improving), possibly d/t volume overload P: Increase lasix to 80mg  PO BID.  HEMATOLOGIC  Lab 08/15/11 0420 08/14/11 0730 08/13/11 0430 08/12/11 0421 08/11/11 2129  HGB 8.5* 8.8* 9.1* 10.7* 11.3*  HCT 26.7* 27.3* 27.8* 32.4* 34.2*  PLT 314 281 229 278 283  INR -- -- -- -- --  APTT -- -- -- -- --   A:  Anemia of critical illness P: PRBC for hgb < 7gm% unless actively bleeding or in MI  GASTROINTESTINAL  Lab 08/11/11 2129  AST 21  ALT 11  ALKPHOS 85  BILITOT 0.6  PROT 6.6  ALBUMIN 2.7*   A:  Hypoalbuminemic, likely d/t chronic disease and probable decreased PO intake P: Continue TF  ENDOCRINE  Lab 08/11/11 1847  GLUCAP 105*  TSH --  CORTISOL --   A:  No active issues. Random cortisol was 17.6 (21:29), which is slightly above PM range as expected. P: Continue to monitor  DERMATOLOGY A: No active issues. P: Continue to monitor.  SYMPTOMS: At this time, patient has no complaints. Attempt extubation today.   GLOBAL: Full code. family updated about patients condition at bedside. 08/15/2011  Darnelle Maffucci,  MD Pager: [phone] 08/15/2011  STAFF NOTE: I, Dr Lavinia Sharps have personally reviewed patient's available data, including medical history, events of note, physical examination and test results as part of my evaluation. I have discussed with resident/NP and other care providers such as pharmacist, RN and RRT.  In addition,  I personally evaluated patient and elicited key findings of ARDS and resp failure to pneumonia v UTI v both. Met extubation criteria and extubated. Dr Cornelius Moras does not think she need chest tube at this point. Ultimately need to order VEGF serum to rule out LAM.  Rest per NP/medical resident whose note is outlined above and that I agree with  The patient is critically ill with multiple organ systems failure and requires high complexity decision making for assessment and support, frequent evaluation and titration of therapies, application of advanced monitoring technologies and extensive interpretation of multiple databases.   Critical Care Time devoted to patient care services described in this note is  31 Minutes.  Dr. Kalman Shan, M.D., [facility].C.P Pulmonary and Critical Care Medicine Staff Physician [facility]  Pulmonary and Critical Care Pager: [phone], If no answer or between  15:00h - 7:00h: call [phone]  08/15/2011 4:50 PM

## 2011-08-15 NOTE — Progress Notes (Signed)
ANTIBIOTIC CONSULT NOTE - FOLLOW UP  Pharmacy Consult for Vanc/Zosyn Indication: rule out pneumonia  Allergies  Allergen Reactions  . Latex Shortness Of Breath  . Codeine Nausea And Vomiting  . Erythromycin Nausea And Vomiting    Patient Measurements: Height: 4\' 8"  (142.2 cm) Weight: 110 lb 10.7 oz (50.2 kg) IBW/kg (Calculated) : 36.3    Vital Signs: Temp: 100 F (37.8 C) (04/12 0401) Temp src: Oral (04/12 0401) BP: 125/70 mmHg (04/12 1100) Pulse Rate: 121  (04/12 1125) Intake/Output from previous day: 04/11 0701 - 04/12 0700 In: 2495.7 [I.V.:1041.7; NG/GT:1100; IV Piggyback:354] Out: 2605 [Urine:2605] Intake/Output from this shift: Total I/O In: 248 [I.V.:63; NG/GT:135; IV Piggyback:50] Out: 1200 [Urine:1200]  Labs:  [facility] 08/15/11 0420 08/14/11 0730 08/13/11 0430  WBC 8.9 7.5 7.6  HGB 8.5* 8.8* 9.1*  PLT 314 281 229  LABCREA -- -- --  CREATININE 1.04 0.99 0.78   Estimated Creatinine Clearance: 45.7 ml/min (by C-G formula based on Cr of 1.04).  Basename 08/15/11 1015  VANCOTROUGH 17.0  VANCOPEAK --  VANCORANDOM --  GENTTROUGH --  GENTPEAK --  GENTRANDOM --  TOBRATROUGH --  TOBRAPEAK --  TOBRARND --  AMIKACINPEAK --  AMIKACINTROU --  AMIKACIN --     Microbiology: Recent Results (from the past 720 hour(s))  URINE CULTURE     Status: Normal   Collection Time   08/06/11 10:54 PM      Component Value Range Status Comment   Specimen Description URINE, CATHETERIZED   Final    Special Requests Normal   Final    Culture  Setup Time [id number]   Final    Colony Count >=100,000 COLONIES/ML   Final    Culture ESCHERICHIA COLI   Final    Report Status 08/08/2011 FINAL   Final    Organism ID, Bacteria ESCHERICHIA COLI   Final   MRSA PCR SCREENING     Status: Abnormal   Collection Time   08/07/11  9:07 AM      Component Value Range Status Comment   MRSA by PCR POSITIVE (*) NEGATIVE  Final   CULTURE, BLOOD (ROUTINE X 2)     Status: Normal (Preliminary  result)   Collection Time   08/11/11  9:35 AM      Component Value Range Status Comment   Specimen Description BLOOD LEFT ARM   Final    Special Requests     Final    Value: BOTTLES DRAWN AEROBIC ONLY 10CC PATIENT ON FOLLOWING CIPRO   Culture  Setup Time [id number]   Final    Culture     Final    Value:        BLOOD CULTURE RECEIVED NO GROWTH TO DATE CULTURE WILL BE HELD FOR 5 DAYS BEFORE ISSUING A FINAL NEGATIVE REPORT   Report Status PENDING   Incomplete   CULTURE, BLOOD (ROUTINE X 2)     Status: Normal (Preliminary result)   Collection Time   08/11/11  9:40 AM      Component Value Range Status Comment   Specimen Description BLOOD LEFT HAND   Final    Special Requests     Final    Value: BOTTLES DRAWN AEROBIC AND ANAEROBIC 10CC BLUE,5CC RED PATIENT ON FOLLOWING CIPRO   Culture  Setup Time [id number]   Final    Culture     Final    Value:        BLOOD CULTURE RECEIVED NO GROWTH TO DATE CULTURE WILL BE HELD FOR  5 DAYS BEFORE ISSUING A FINAL NEGATIVE REPORT   Report Status PENDING   Incomplete   CULTURE, BLOOD (ROUTINE X 2)     Status: Normal (Preliminary result)   Collection Time   08/11/11  9:04 PM      Component Value Range Status Comment   Specimen Description BLOOD LEFT HAND   Final    Special Requests BOTTLES DRAWN AEROBIC ONLY 10CC   Final    Culture  Setup Time [id number]   Final    Culture     Final    Value:        BLOOD CULTURE RECEIVED NO GROWTH TO DATE CULTURE WILL BE HELD FOR 5 DAYS BEFORE ISSUING A FINAL NEGATIVE REPORT   Report Status PENDING   Incomplete   CULTURE, BLOOD (ROUTINE X 2)     Status: Normal (Preliminary result)   Collection Time   08/11/11  9:23 PM      Component Value Range Status Comment   Specimen Description BLOOD RIGHT HAND   Final    Special Requests BOTTLES DRAWN AEROBIC ONLY [facility]   Final    Culture  Setup Time [id number]   Final    Culture     Final    Value:        BLOOD CULTURE RECEIVED NO GROWTH TO DATE CULTURE WILL BE HELD FOR 5 DAYS  BEFORE ISSUING A FINAL NEGATIVE REPORT   Report Status PENDING   Incomplete   URINE CULTURE     Status: Normal   Collection Time   08/12/11  4:32 AM      Component Value Range Status Comment   Specimen Description URINE, CATHETERIZED   Final    Special Requests NONE   Final    Culture  Setup Time [id number]   Final    Colony Count NO GROWTH   Final    Culture NO GROWTH   Final    Report Status 08/13/2011 FINAL   Final     Anti-infectives     Start     Dose/Rate Route Frequency Ordered Stop   08/13/11 1000   vancomycin (VANCOCIN) 500 mg in sodium chloride 0.9 % 100 mL IVPB        500 mg 100 mL/hr over 60 Minutes Intravenous Every 12 hours 08/13/11 0929     08/12/11 1600   vancomycin (VANCOCIN) IVPB 1000 mg/200 mL premix  Status:  Discontinued        1,000 mg 200 mL/hr over 60 Minutes Intravenous Every 24 hours 08/11/11 2107 08/13/11 0929   08/12/11 0000  piperacillin-tazobactam (ZOSYN) IVPB 3.375 g       3.375 g 12.5 mL/hr over 240 Minutes Intravenous 3 times per day 08/11/11 2107     08/11/11 2200   levofloxacin (LEVAQUIN) IVPB 750 mg  Status:  Discontinued        750 mg 100 mL/hr over 90 Minutes Intravenous Every 24 hours 08/11/11 1420 08/11/11 2048   08/11/11 2100   piperacillin-tazobactam (ZOSYN) IVPB 3.375 g  Status:  Discontinued        3.375 g 100 mL/hr over 30 Minutes Intravenous  Once 08/11/11 2048 08/11/11 2103   08/11/11 2100   vancomycin (VANCOCIN) IVPB 1000 mg/200 mL premix  Status:  Discontinued        1,000 mg 200 mL/hr over 60 Minutes Intravenous  Once 08/11/11 2048 08/11/11 2049   08/11/11 1600   piperacillin-tazobactam (ZOSYN) IVPB 3.375 g  Status:  Discontinued  3.375 g 12.5 mL/hr over 240 Minutes Intravenous Every 8 hours 08/11/11 1420 08/11/11 2048   08/11/11 1500   vancomycin (VANCOCIN) IVPB 1000 mg/200 mL premix  Status:  Discontinued        1,000 mg 200 mL/hr over 60 Minutes Intravenous Every 24 hours 08/11/11 1420 08/11/11 2048   08/08/11  1430   ciprofloxacin (CIPRO) tablet 500 mg  Status:  Discontinued        500 mg Oral 2 times daily 08/08/11 1345 08/11/11 1344          Assessment: 46 yo F admitted 08/06/11 for Shortness of breath. Originally started on cipro for e. Coli uti on 4/5. Now on vancomycin and zosyn for broad empiric coverage for possible pneumonia. WBC is trending down and is WNL and tmax 100.3. Renal function is stable (scr 1.04 with CrCl ~ 45-82ml/min and UOP/24hrs 2.2 ml/kg/hr on diuretics). Vancomycin trough this AM is therapeutic at 17. Will continue same dosing.  Cx:  4/5 urine - ecoli  4/8 bld x2 - NGTD  4/9 urine: Neg  Abx:  4/5 cipro>>4/8  4/8 vanc>>  4/8 zosyn>>   Goal of Therapy:  Vancomycin trough level 15-20 mcg/ml  Plan:  1) Continue Vancomycin 500 mg IV q12hrs 2) Continue Zosyn 3.375 grams IV q8hrs (4 hour infusion) 3) Monitor renal fx, changes in scr, fever curve, cxs and LOT/descalation  Janace Litten, PharmD [phone] 08/15/2011,11:39 AM

## 2011-08-15 NOTE — Procedures (Signed)
Extubation Procedure Note  Patient Details:   Name: Rhonda Ramos DOB: 11-18-1965 MRN: [id number]   Airway Documentation:  Airway 8 mm (Active)     Airway 7.5 mm (Active)  Secured at (cm) 20 cm 08/15/2011  9:23 AM  Measured From Lips 08/15/2011  9:23 AM  Secured Location Center 08/15/2011  9:23 AM  Secured By [employer] 08/15/2011  9:23 AM  Tube Holder Repositioned Yes 08/15/2011  4:05 AM  Cuff Pressure (cm H2O) 22 cm H2O 08/14/2011  8:45 AM  Site Condition Dry 08/15/2011  9:23 AM    Evaluation  O2 sats: stable throughout Complications: No apparent complications Patient did tolerate procedure well. Bilateral Breath Sounds: Diminished Suctioning: Airway Yes No stridor noted at this time.   Fara Olden 08/15/2011, 12:05 PM

## 2011-08-15 NOTE — Progress Notes (Addendum)
[address]            [name] [id number]          [phone]          Subjective: Awake, alert on vent.  Coughing around ET tube.   Objective: Vital signs in last 24 hours: Patient Vitals for the past 24 hrs:  BP Temp Temp src Pulse Resp SpO2 Height Weight  08/15/11 0800 114/64 mmHg - - 107  30  97 % - -  08/15/11 0700 107/55 mmHg - - 102  32  98 % - -  08/15/11 0600 106/56 mmHg - - 105  31  96 % - -  08/15/11 0500 102/58 mmHg - - 106  29  97 % - 50.2 kg (110 lb 10.7 oz)  08/15/11 0405 119/58 mmHg - - 116  36  94 % - -  08/15/11 0401 - 100 F (37.8 C) Oral - - - - -  08/15/11 0400 87/73 mmHg - - 95  32  96 % - -  08/15/11 0300 99/55 mmHg - - 98  31  98 % - -  08/15/11 0200 98/55 mmHg - - 98  32  98 % - -  08/15/11 0100 95/53 mmHg - - 101  32  97 % - -  08/15/11 0023 100/56 mmHg - - 98  32  96 % - -  08/15/11 0000 100/56 mmHg - - 100  31  96 % - -  08/14/11 2343 - 98.9 F (37.2 C) Oral - - - - -  08/14/11 2300 104/52 mmHg - - 110  28  97 % - -  08/14/11 2200 102/57 mmHg - - 106  30  97 % - -  08/14/11 2131 98/56 mmHg - - 103  32  96 % - -  08/14/11 2100 98/54 mmHg - - 106  28  95 % - -  08/14/11 2000 95/69 mmHg 99.7 F (37.6 C) Oral 107  32  97 % - -  08/14/11 1900 98/54 mmHg - - 105  32  96 % - -  08/14/11 1853 - - - 108  32  97 % - -  08/14/11 1800 116/58 mmHg - - 114  30  96 % - -  08/14/11 1724 - - - 109  30  95 % - -  08/14/11 1700 96/49 mmHg - - 108  29  95 % - -  08/14/11 1600 96/53 mmHg 100.3 F (37.9 C) - 109  26  96 % - -  08/14/11 1539 - - - - - - 4\' 8"  (1.422 m) -  08/14/11 1500 101/48 mmHg - - 108  32  94 % - -  08/14/11 1400 - - - 113  28  98 % - -  08/14/11 1300 124/67 mmHg - - 122  28  98 % - -  08/14/11 1230 119/65 mmHg - - 115  32  97 % - -  08/14/11 1200 119/63 mmHg - - 114  32  96 % - -  08/14/11 1100 90/49 mmHg - - 99  32  96 % - -  08/14/11 1000 93/44 mmHg - - 103  32  97 % - -   Current Weight    08/15/11 50.2 kg (110 lb 10.7 oz)     Intake/Output from previous day: 04/11 0701 -  04/12 0700 In: 2495.7 [I.V.:1041.7; NG/GT:1100; IV Piggyback:354] Out: 2605 [Urine:2605]    PHYSICAL EXAM:  Heart: tachy 120s Lungs:diminished BS in bases bilaterally, no rales or rhonchi   Lab Results: CBC: Basename 08/15/11 0420 08/14/11 0730  WBC 8.9 7.5  HGB 8.5* 8.8*  HCT 26.7* 27.3*  PLT 314 281   BMET:  Basename 08/15/11 0420 08/14/11 0730  NA 144 139  K 3.9 3.3*  CL 104 101  CO2 32 28  GLUCOSE 131* 143*  BUN 26* 22  CREATININE 1.04 0.99  CALCIUM 8.8 8.4    CXR: IMPRESSION:  1. Stable lines and tubes.  2. Persistent left pneumothorax suspected and probably stable in  extent.  3. Stable right lung pneumonia with some superimposed pleural  fluid.   Assessment/Plan: Tiny L apical ptx, stable. R pneumonia with loculated effusion- continue antibiotics, pulm toilet. If effusion persists, could attempt u/s guided thoracentesis vs CT placement. ?extubate soon per pulm/CCM   LOS: 9 days    COLLINS,GINA H 08/15/2011   I have seen and examined the patient and agree with the assessment and plan as outlined.  Probably ready for extubation soon.  Satomi Buda H 08/15/2011 9:50 AM

## 2011-08-16 ENCOUNTER — Inpatient Hospital Stay (HOSPITAL_COMMUNITY): Payer: Medicare Other

## 2011-08-16 DIAGNOSIS — J90 Pleural effusion, not elsewhere classified: Secondary | ICD-10-CM

## 2011-08-16 DIAGNOSIS — A419 Sepsis, unspecified organism: Secondary | ICD-10-CM

## 2011-08-16 LAB — BASIC METABOLIC PANEL
Calcium: 9.9 mg/dL (ref 8.4–10.5)
GFR calc Af Amer: 74 mL/min — ABNORMAL LOW (ref >90)
GFR calc non Af Amer: 64 mL/min — ABNORMAL LOW (ref >90)
Potassium: 3.3 mEq/L — ABNORMAL LOW (ref 3.5–5.1)
Sodium: 138 mEq/L (ref 135–145)

## 2011-08-16 LAB — CBC
Hemoglobin: 10.7 g/dL — ABNORMAL LOW (ref 12.0–15.0)
Platelets: 455 10*3/uL — ABNORMAL HIGH (ref 150–400)
RBC: 3.56 MIL/uL — ABNORMAL LOW (ref 3.87–5.11)
WBC: 16.2 10*3/uL — ABNORMAL HIGH (ref 4.0–10.5)

## 2011-08-16 LAB — PROTEIN, BODY FLUID: Total protein, fluid: 4.5 g/dL

## 2011-08-16 LAB — LACTATE DEHYDROGENASE, PLEURAL OR PERITONEAL FLUID: LD, Fluid: 263 U/L — ABNORMAL HIGH (ref 3–23)

## 2011-08-16 LAB — BODY FLUID CELL COUNT WITH DIFFERENTIAL
Monocyte-Macrophage-Serous Fluid: 14 % — ABNORMAL LOW (ref 50–90)
Total Nucleated Cell Count, Fluid: 6100 cu mm — ABNORMAL HIGH (ref 0–1000)

## 2011-08-16 LAB — PH, BODY FLUID

## 2011-08-16 MED ORDER — MIDAZOLAM HCL 2 MG/2ML IJ SOLN
INTRAMUSCULAR | Status: AC
  Filled 2011-08-16: qty 2

## 2011-08-16 MED ORDER — POTASSIUM CHLORIDE CRYS ER 20 MEQ PO TBCR
40.0000 meq | EXTENDED_RELEASE_TABLET | Freq: Once | ORAL | Status: AC
  Administered 2011-08-16: 40 meq via ORAL
  Filled 2011-08-16: qty 2

## 2011-08-16 MED ORDER — MIDAZOLAM HCL 5 MG/5ML IJ SOLN
1.0000 mg | Freq: Once | INTRAMUSCULAR | Status: AC
  Administered 2011-08-16: 1 mg via INTRAVENOUS

## 2011-08-16 MED ORDER — LEVALBUTEROL TARTRATE 45 MCG/ACT IN AERO
2.0000 | INHALATION_SPRAY | Freq: Three times a day (TID) | RESPIRATORY_TRACT | Status: DC
  Administered 2011-08-16 – 2011-08-22 (×15): 2 via RESPIRATORY_TRACT

## 2011-08-16 MED ORDER — DOCUSATE SODIUM 100 MG PO CAPS
100.0000 mg | ORAL_CAPSULE | Freq: Two times a day (BID) | ORAL | Status: DC
  Administered 2011-08-16 – 2011-08-19 (×5): 100 mg via ORAL
  Filled 2011-08-16 (×10): qty 1

## 2011-08-16 MED ORDER — WHITE PETROLATUM GEL
Status: AC
  Administered 2011-08-16: 22:00:00
  Filled 2011-08-16: qty 5

## 2011-08-16 MED ORDER — MIDAZOLAM HCL 2 MG/2ML IJ SOLN
INTRAMUSCULAR | Status: AC
  Administered 2011-08-16: 1 mg via INTRAVENOUS
  Filled 2011-08-16: qty 2

## 2011-08-16 MED ORDER — WHITE PETROLATUM GEL
Status: AC
  Administered 2011-08-16: 03:00:00
  Filled 2011-08-16: qty 5

## 2011-08-16 NOTE — Progress Notes (Addendum)
Subjective:   Alert, extubated on non-rebreather  Objective:  Vital Signs in the last 24 hours: Temp:  [98.3 F (36.8 C)-101.1 F (38.4 C)] 98.3 F (36.8 C) (04/13 0800) Pulse Rate:  [93-130] 93  (04/13 0600) Resp:  [14-29] 27  (04/13 0600) BP: (112-139)/(63-107) 115/74 mmHg (04/13 0600) SpO2:  [86 %-100 %] 99 % (04/13 0600) FiO2 (%):  [40.1 %-60 %] 60 % (04/13 0800) Weight:  [104 lb 8 oz (47.4 kg)] 104 lb 8 oz (47.4 kg) (04/13 0500)  Intake/Output from previous day: 04/12 0701 - 04/13 0700 In: 788 [I.V.:303; NG/GT:135; IV Piggyback:350] Out: 6400 [Urine:6400] Intake/Output from this shift: Total I/O In: 22.5 [I.V.:10; IV Piggyback:12.5] Out: 175 [Urine:175]  Physical Exam: General appearance: alert and no distress Lungs: diminished breath sounds bibasilar, and throughout right lung Heart: regular rate and rhythm Skin: chest tube site clean and dry  Lab Results:  Basename 08/16/11 0500 08/15/11 0420  WBC 16.2* 8.9  HGB 10.7* 8.5*  PLT 455* 314    Basename 08/16/11 0500 08/15/11 0420  NA 138 144  K 3.3* 3.9  CL 90* 104  CO2 37* 32  GLUCOSE 107* 131*  BUN 20 26*  CREATININE 1.05 1.04   No results found for this basename: TROPONINI:2,CK,MB:2 in the last 72 hours Hepatic Function Panel No results found for this basename: PROT,ALBUMIN,AST,ALT,ALKPHOS,BILITOT,BILIDIR,IBILI in the last 72 hours No results found for this basename: CHOL in the last 72 hours No results found for this basename: PROTIME in the last 72 hours  Imaging: Imaging results have been reviewed: CXR worsening right sided pleural effusion, minimal left pneumothorax  Assessment/Plan:   1. Minimal left pneumothorax- stable 2. Worsening right pleural effusion- would recommend IR drainage 3. Extubated- currently on non-rebreather 4. Plan per primary service 5. Will follow  LOS: 10 days    Rhonda Ramos 08/16/2011, 10:16 AM   patient examined and medical record reviewed,agree with above  note. Rhonda Ramos,Rhonda Ramos 08/16/2011

## 2011-08-16 NOTE — Procedures (Signed)
Procedure : right diagnostic thoracentesis Specimen : 90 ml blood tinged serous fluid Complications : none immediate  Fluid sent to lab as requested.  Post CXR pending.  Patient tolerated well

## 2011-08-16 NOTE — Progress Notes (Signed)
Name: Rhonda Ramos DOB: [birth year] MRN: [id number] PCP: Rene Paci, MD, MD ADMIT DATE: 08/06/2011 LOS: 10  PCCM PROGRESS NOTE  Brief Patient Profile: Patient is a 46yo F with h/o chronic pain syndrome related to spina bifida & scoliosis admitted on 08/06/11 to [facility] for increasing SOB. In Feb 2013, she underwent VATS for stapling of blebs with mech pleurodesis by Dr. Tyrone Sage. Patient was found to have right spontaneous pneumothorax and a right sided chest tube was placed by Dr. Sharlene Motts on 4/3. Patient had urinary tract infection diagnosed on 4/5 and was started on Cipro. Patient started having fever and chills starting yesterday (4/7). Fever was as high as 101 F on 4/7. A CXR was obtained which showed bibasilar atelactasis on 4/7. Chest tube was taken out on 4/7. On 4/8 patient continued to have fever along with developing shortness of breath for which she was placed on non rebreather and ABG was obtained. PCCM was consulted when patient continued to do worse on non-rebreather and tranferred to ICU, is currently on ARDS protocol.   Best Practice: DVT: Lovenox SUP: Protonix Nutrition: PO Glycemic control: None   Lines / Drains: Right IJ 4/8>>>  ETT 4/8>>> 08/15/11  Aline >. ? Date >> 08/14/2011  Micro:  Lab 08/13/11 0430 08/11/11 2129  LATICACIDVEN 0.7 0.9   Recent Results (from the past 240 hour(s))  URINE CULTURE     Status: Normal   Collection Time   08/06/11 10:54 PM      Component Value Range Status Comment   Specimen Description URINE, CATHETERIZED   Final    Special Requests Normal   Final    Culture  Setup Time [id number]   Final    Colony Count >=100,000 COLONIES/ML   Final    Culture ESCHERICHIA COLI   Final    Report Status 08/08/2011 FINAL   Final    Organism ID, Bacteria ESCHERICHIA COLI   Final   MRSA PCR SCREENING     Status: Abnormal   Collection Time   08/07/11  9:07 AM      Component Value Range Status Comment   MRSA by PCR POSITIVE (*) NEGATIVE  Final    CULTURE, BLOOD (ROUTINE X 2)     Status: Normal (Preliminary result)   Collection Time   08/11/11  9:35 AM      Component Value Range Status Comment   Specimen Description BLOOD LEFT ARM   Final    Special Requests     Final    Value: BOTTLES DRAWN AEROBIC ONLY 10CC PATIENT ON FOLLOWING CIPRO   Culture  Setup Time [id number]   Final    Culture     Final    Value:        BLOOD CULTURE RECEIVED NO GROWTH TO DATE CULTURE WILL BE HELD FOR 5 DAYS BEFORE ISSUING A FINAL NEGATIVE REPORT   Report Status PENDING   Incomplete   CULTURE, BLOOD (ROUTINE X 2)     Status: Normal (Preliminary result)   Collection Time   08/11/11  9:40 AM      Component Value Range Status Comment   Specimen Description BLOOD LEFT HAND   Final    Special Requests     Final    Value: BOTTLES DRAWN AEROBIC AND ANAEROBIC 10CC BLUE,5CC RED PATIENT ON FOLLOWING CIPRO   Culture  Setup Time [id number]   Final    Culture     Final    Value:        BLOOD  CULTURE RECEIVED NO GROWTH TO DATE CULTURE WILL BE HELD FOR 5 DAYS BEFORE ISSUING A FINAL NEGATIVE REPORT   Report Status PENDING   Incomplete   CULTURE, BLOOD (ROUTINE X 2)     Status: Normal (Preliminary result)   Collection Time   08/11/11  9:04 PM      Component Value Range Status Comment   Specimen Description BLOOD LEFT HAND   Final    Special Requests BOTTLES DRAWN AEROBIC ONLY 10CC   Final    Culture  Setup Time [id number]   Final    Culture     Final    Value:        BLOOD CULTURE RECEIVED NO GROWTH TO DATE CULTURE WILL BE HELD FOR 5 DAYS BEFORE ISSUING A FINAL NEGATIVE REPORT   Report Status PENDING   Incomplete   CULTURE, BLOOD (ROUTINE X 2)     Status: Normal (Preliminary result)   Collection Time   08/11/11  9:23 PM      Component Value Range Status Comment   Specimen Description BLOOD RIGHT HAND   Final    Special Requests BOTTLES DRAWN AEROBIC ONLY [facility]   Final    Culture  Setup Time [id number]   Final    Culture     Final    Value:        BLOOD CULTURE  RECEIVED NO GROWTH TO DATE CULTURE WILL BE HELD FOR 5 DAYS BEFORE ISSUING A FINAL NEGATIVE REPORT   Report Status PENDING   Incomplete   URINE CULTURE     Status: Normal   Collection Time   08/12/11  4:32 AM      Component Value Range Status Comment   Specimen Description URINE, CATHETERIZED   Final    Special Requests NONE   Final    Culture  Setup Time [id number]   Final    Colony Count NO GROWTH   Final    Culture NO GROWTH   Final    Report Status 08/13/2011 FINAL   Final     Antibiotics: Anti-infectives     Start     Dose/Rate Route Frequency Ordered Stop   08/13/11 1000   vancomycin (VANCOCIN) 500 mg in sodium chloride 0.9 % 100 mL IVPB        500 mg 100 mL/hr over 60 Minutes Intravenous Every 12 hours 08/13/11 0929     08/12/11 1600   vancomycin (VANCOCIN) IVPB 1000 mg/200 mL premix  Status:  Discontinued        1,000 mg 200 mL/hr over 60 Minutes Intravenous Every 24 hours 08/11/11 2107 08/13/11 0929   08/12/11 0000  piperacillin-tazobactam (ZOSYN) IVPB 3.375 g       3.375 g 12.5 mL/hr over 240 Minutes Intravenous 3 times per day 08/11/11 2107     08/11/11 2200   levofloxacin (LEVAQUIN) IVPB 750 mg  Status:  Discontinued        750 mg 100 mL/hr over 90 Minutes Intravenous Every 24 hours 08/11/11 1420 08/11/11 2048   08/11/11 2100   piperacillin-tazobactam (ZOSYN) IVPB 3.375 g  Status:  Discontinued        3.375 g 100 mL/hr over 30 Minutes Intravenous  Once 08/11/11 2048 08/11/11 2103   08/11/11 2100   vancomycin (VANCOCIN) IVPB 1000 mg/200 mL premix  Status:  Discontinued        1,000 mg 200 mL/hr over 60 Minutes Intravenous  Once 08/11/11 2048 08/11/11 2049   08/11/11 1600   piperacillin-tazobactam (ZOSYN)  IVPB 3.375 g  Status:  Discontinued        3.375 g 12.5 mL/hr over 240 Minutes Intravenous Every 8 hours 08/11/11 1420 08/11/11 2048   08/11/11 1500   vancomycin (VANCOCIN) IVPB 1000 mg/200 mL premix  Status:  Discontinued        1,000 mg 200 mL/hr over 60  Minutes Intravenous Every 24 hours 08/11/11 1420 08/11/11 2048   08/08/11 1430   ciprofloxacin (CIPRO) tablet 500 mg  Status:  Discontinued        500 mg Oral 2 times daily 08/08/11 1345 08/11/11 1344          Tests / Events: Chest CT 4/3>>new pneumothrax  4/7 CT out CXR 4/8>>right lower lobe opacity 08/12/11 > start 48h nimbex for severe ARDS 08/14/11 >>CT chest 08/14/2011 >> RLL consolidation with loculated small effusion (does not warrant Chest tue per CVTS), Some ARDS bilateral LL, and very small left ptx (new)    Consults: CTCS, TRH  Subjective/Overnight/Interval History: Pt partially arousable, open eyes, blinks to command, but does not move extremities. No major issues overnight.   Staff note: met extubation criteria ane extubated   Vital Signs: Temp:  [98.3 F (36.8 C)-101.1 F (38.4 C)] 98.3 F (36.8 C) (04/13 0800) Pulse Rate:  [93-130] 93  (04/13 0600) Resp:  [14-29] 27  (04/13 0600) BP: (112-139)/(59-107) 115/74 mmHg (04/13 0600) SpO2:  [86 %-100 %] 99 % (04/13 0600) FiO2 (%):  [40 %-60 %] 60 % (04/13 0800) Weight:  [47.4 kg (104 lb 8 oz)] 47.4 kg (104 lb 8 oz) (04/13 0500) I/O last 3 completed shifts: In: 2147 [I.V.:802; NG/GT:845; IV Piggyback:500] Out: 7055 [Urine:7055]  Physical Examination: Last febrile at 1440 on 4/8 General: no acute distress Neuro:  Awake, follows commands, +corneal and pupillary reflex HEENT: Intubated   Cardiovascular:  tachycardic Lungs:   improved aeration b/l, Breath sounds on R < L Abdomen:  Soft, NT/ND, normoactive BS   Ventilator settings: Vent Mode:  [-]  FiO2 (%):  [40 %-60 %] 60 %  Labs/Radiology: Please see A and P for full labs  Ct Chest Wo Contrast  08/14/2011  *RADIOLOGY REPORT*  Clinical Data: History of right pleural effusion.  Question loculation.  Pneumothorax.  CT CHEST WITHOUT CONTRAST  Technique:  Multidetector CT imaging of the chest was performed following the standard protocol without IV contrast.   Comparison: Multiple prior chest radiographs and CT chest 08/06/2011.  Findings: Mediastinal lymph nodes are not enlarged by CT size criteria.  Hilar regions are difficult to definitively evaluate without IV contrast.  No axillary adenopathy.  Endotracheal tube is in satisfactory position.  Heart size normal.  No pericardial effusion.  There is a partially loculated small to moderate right pleural effusion with collapse/consolidation in the right middle and right lower lobes.  Postoperative changes are seen in the medial aspect of the lower right hemithorax, adjacent to focal areas of lucency (images 14 to 26).  Lucencies correspond to blebs and pleural air on 08/06/2011.  Patchy ground-glass is seen diffusely.  Blebs and small left pneumothorax in the left hemithorax.  Airspace consolidation in the lingula and left lower lobe.  Incidental imaging of the upper abdomen shows no acute findings. No worrisome lytic or sclerotic lesions.  IMPRESSION:  1.  New partially loculated small to moderate right pleural effusion, worrisome for empyema. 2.  Complete or near complete resolution of right pneumothorax, with a small amount of pleural air and/or blebs in the medial aspect of the  lower right hemithorax, as before. 3.  Multilobar collapse/consolidation and ground-glass, most indicative of pneumonia. 4.  Small left pneumothorax.  Original Report Authenticated By: Reyes Ivan, M.D.   Dg Chest Port 1 View  08/16/2011  *RADIOLOGY REPORT*  Clinical Data: Lung disease. Recently intubated patient.  PORTABLE CHEST - 1 VIEW  Comparison:   08/15/2011 and 08/14/2011.  Findings: 0530 hours. There has been apparent interval extubation and removal of the nasogastric tube.  Right IJ central venous catheter is unchanged in the mid SVC.  There are right greater than left pleural effusions with associated bibasilar air space opacities.  The right pleural effusion and basilar pulmonary opacity appear worse. Previously noted left  pneumothorax is not well visualized but may have a minimal residual apical component. Heart size and mediastinal contours are stable.  There is a severe convex left thoracolumbar scoliosis.  IMPRESSION: Worsening right pleural effusion and basilar air space disease following extubation. Probable minimal residual left apical pneumothorax.  Original Report Authenticated By: Gerrianne Scale, M.D.   Dg Chest Port 1 View  08/15/2011  *RADIOLOGY REPORT*  Clinical Data: 46 year old female with ventilation dependent respiratory failure.  Sepsis.  PORTABLE CHEST - 1 VIEW  Comparison: 08/14/2011 and earlier.  Findings: AP portable semi upright view 0601 hours.  Endotracheal tube tip projects between level of the clavicles and carina. Enteric tube is looped in the over the gastric air.  Stable right IJ central line.  Continued lucency in the left hemithorax suspicious for pneumothorax, likely stable.  Right lung base opacity is stable.  Stable cardiac size and mediastinal contours.  IMPRESSION: 1. Stable lines and tubes. 2.  Persistent left pneumothorax suspected and probably stable in extent. 3. Stable right lung pneumonia with some superimposed pleural fluid.  Original Report Authenticated By: Harley Hallmark, M.D.   Dg Chest Port 1 View  08/15/2011  *RADIOLOGY REPORT*  Clinical Data: Evaluate left-sided pneumothorax.  PORTABLE CHEST - 1 VIEW  Comparison: 08/14/2011 CT and 08/13/2011 chest x-ray.  Findings: The CT detected left pneumothorax is not as well delineated on the present plain film exam.  The subtle lucency in the left costophrenic angle region may reflect the CT detected pneumothorax.  Bullae/bleb superior medial aspect left lung limiting evaluation in this region for detection of small pneumothorax.  Right base consolidation with loculated hydropneumothorax.  Right central line tip proximal superior vena cava.  Nasogastric tube courses below the diaphragm.  The tip is not included on this exam.   Endotracheal tube tip 7.2 cm above the carina.  IMPRESSION: CT detected left-sided pneumothorax and right-sided hydropneumothorax better demonstrated on the CT and poorly delineated by plain film exam.  Original Report Authenticated By: Fuller Canada, M.D.   Dg Abd Portable 1v  08/14/2011  *RADIOLOGY REPORT*  Clinical Data: Evaluate for possible ileus.  PORTABLE ABDOMEN - 1 VIEW  Comparison: Abdominal radiograph 08/11/2011.  Findings: A nasogastric tube is seen coiled within the stomach. There is some gas and stool seen scattered throughout the colon extending to the level of the distal rectum.  No pathologic distension of small bowel is appreciated.  There is a paucity of bowel gas throughout the right side of the abdomen.  IMPRESSION: 1.  Nonspecific bowel gas pattern, as above, not consistent with underlying ileus.  Original Report Authenticated By: Florencia Reasons, M.D.    ASSESSMENT AND PLAN  RESPIRATORY  Lab 08/15/11 0505 08/13/11 1049 08/13/11 0449 08/12/11 2109 08/12/11 1842  PHART 7.433* 7.456* 7.464* 7.384  7.313*  PCO2ART 49.7* 38.3 35.7 43.3 54.4*  PO2ART 75.0* 56.0* 65.0* 72.0* 107.0*  HCO3 33.0* 27.0* 25.6* 25.9* 27.2*  O2SAT 94.0 90.0 94.0 94.0 97.0   A:   Acute hypoxemic resp failure/ARDS d/t HCAP v UTI.   Hx of RECURRENT PTX  - RLL consolidation and effusion CT chest 08/14/11>>worsened.    - Left basal new small ptx on ct chest 08/14/11 and stable on cxr 08/15/11 P: Try to tap R effusion under u/s Titrate oxygen , diuresis    Check VEGF - need to contact Dr Conception Chancy at [facility] to rule out LAM   CARDIAC  Lab 08/12/11 1000 08/11/11 2129  TROPONINI -- <0.30  PROBNP 297.7* --   A:  No acute issues. P: Continue to monitor  NEUROLOGIC A: RASS 0. CAM-ICU neg for delirium P: prn fentanyl.   VASCULAR/HEMODYNAMIC A:  Pt remains slightly hypotensive, Left cephalic thrombosis.       Left arm swelling, pt on proph heparin,  P:  Continue to monitor,     INFECTIOUS DISEASE  Lab 08/16/11 0500 08/15/11 0420 08/14/11 0730 08/13/11 0430 08/12/11 0421 08/11/11 2129  WBC 16.2* 8.9 7.5 7.6 13.1* --  PROCALCITON -- -- -- 3.02 -- 2.09   A:  WBC trending down; infectious etiology likely multifocal HCAP PNA given last night's CXR, AM CXR report pending. Off pressors P: Continue vanc/zosyn, goal to stop abx on 4/17   RENAL  Lab 08/16/11 0500 08/15/11 0420 08/14/11 0730 08/13/11 0430 08/12/11 0421  BUN 20 26* 22 14 16   CREATININE 1.05 1.04 0.99 0.78 0.73   I/O last 3 completed shifts: In: 2147 [I.V.:802; NG/GT:845; IV Piggyback:500] Out: 7055 [Urine:7055] A:  No acute issues. P: Continue to monitor  ELECTROLYTES  Lab 08/16/11 0500 08/15/11 0420 08/14/11 0730 08/13/11 0430 08/12/11 0421  NA 138 144 139 134* 133*  K 3.3* 3.9 -- -- --  CL 90* 104 101 99 100  CO2 37* 32 28 28 23   BUN 20 26* 22 14 16   CREATININE 1.05 1.04 0.99 0.78 0.73  CALCIUM 9.9 8.8 8.4 8.5 9.0  MG -- 2.1 -- -- 1.8  PHOS -- 4.0 -- -- --   A: mildly hyponatremic (improving), possibly d/t volume overload P:cont lasix to 80mg  PO BID. Replete K HEMATOLOGIC  Lab 08/16/11 0500 08/15/11 0420 08/14/11 0730 08/13/11 0430 08/12/11 0421  HGB 10.7* 8.5* 8.8* 9.1* 10.7*  HCT 32.6* 26.7* 27.3* 27.8* 32.4*  PLT 455* 314 281 229 278  INR -- -- -- -- --  APTT -- -- -- -- --   A:  Anemia of critical illness P: PRBC for hgb < 7gm% unless actively bleeding or in MI  GASTROINTESTINAL  Lab 08/11/11 2129  AST 21  ALT 11  ALKPHOS 85  BILITOT 0.6  PROT 6.6  ALBUMIN 2.7*   A:  Hypoalbuminemic, likely d/t chronic disease and probable decreased PO intake P: adv diet PO  ENDOCRINE  Lab 08/11/11 1847  GLUCAP 105*  TSH --  CORTISOL --   A:  No active issues. Random cortisol was 17.6 (21:29), which is slightly above PM range as expected. P: Continue to monitor  DERMATOLOGY A: No active issues. P: Continue to monitor.  SYMPTOMS: At this time, patient has no  complaints. Attempt extubation today.   GLOBAL: Full code.    Shan Levans  Beeper  [phone]  Cell  [phone]  If no response or cell goes to voicemail, call beeper [phone] 08/16/2011 9:25 AM
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
## 2011-08-17 ENCOUNTER — Inpatient Hospital Stay (HOSPITAL_COMMUNITY): Payer: Medicare Other

## 2011-08-17 LAB — BASIC METABOLIC PANEL
BUN: 26 mg/dL — ABNORMAL HIGH (ref 6–23)
CO2: 34 mEq/L — ABNORMAL HIGH (ref 19–32)
Chloride: 91 mEq/L — ABNORMAL LOW (ref 96–112)
GFR calc Af Amer: 62 mL/min — ABNORMAL LOW (ref >90)
Potassium: 3.3 mEq/L — ABNORMAL LOW (ref 3.5–5.1)

## 2011-08-17 LAB — CULTURE, BLOOD (ROUTINE X 2)
Culture: NO GROWTH
Culture: NO GROWTH

## 2011-08-17 LAB — CBC
HCT: 36.1 % (ref 36.0–46.0)
Hemoglobin: 12 g/dL (ref 12.0–15.0)
MCHC: 33.2 g/dL (ref 30.0–36.0)

## 2011-08-17 MED ORDER — MIDAZOLAM HCL 2 MG/2ML IJ SOLN
1.0000 mg | Freq: Once | INTRAMUSCULAR | Status: AC
  Administered 2011-08-16: 1 mg via INTRAVENOUS

## 2011-08-17 MED ORDER — MIDAZOLAM HCL 2 MG/2ML IJ SOLN
1.0000 mg | Freq: Once | INTRAMUSCULAR | Status: AC
  Administered 2011-08-17 (×2): 1 mg via INTRAVENOUS

## 2011-08-17 MED ORDER — POTASSIUM CHLORIDE CRYS ER 20 MEQ PO TBCR
40.0000 meq | EXTENDED_RELEASE_TABLET | Freq: Once | ORAL | Status: AC
  Administered 2011-08-17: 40 meq via ORAL
  Filled 2011-08-17: qty 2

## 2011-08-17 MED ORDER — MIDAZOLAM HCL 2 MG/2ML IJ SOLN
INTRAMUSCULAR | Status: AC
  Administered 2011-08-17: 1 mg via INTRAVENOUS
  Filled 2011-08-17: qty 2

## 2011-08-17 MED ORDER — POTASSIUM CHLORIDE CRYS ER 20 MEQ PO TBCR
40.0000 meq | EXTENDED_RELEASE_TABLET | Freq: Once | ORAL | Status: AC
  Administered 2011-08-17: 40 meq via ORAL
  Filled 2011-08-17 (×2): qty 1

## 2011-08-17 MED ORDER — FUROSEMIDE 80 MG PO TABS
80.0000 mg | ORAL_TABLET | Freq: Every day | ORAL | Status: DC
  Administered 2011-08-17 – 2011-08-18 (×2): 80 mg via ORAL
  Filled 2011-08-17 (×2): qty 1

## 2011-08-17 MED ORDER — MIDAZOLAM HCL 2 MG/2ML IJ SOLN
INTRAMUSCULAR | Status: AC
  Filled 2011-08-17: qty 2

## 2011-08-17 MED ORDER — MIDAZOLAM HCL 5 MG/5ML IJ SOLN: 1.0000 mg | Freq: Once | INTRAMUSCULAR | Status: DC

## 2011-08-17 MED ORDER — CLONAZEPAM 0.5 MG PO TABS
0.5000 mg | ORAL_TABLET | Freq: Three times a day (TID) | ORAL | Status: DC | PRN
  Administered 2011-08-17 – 2011-08-22 (×15): 0.5 mg via ORAL
  Filled 2011-08-17 (×15): qty 1

## 2011-08-17 NOTE — Progress Notes (Signed)
Name: Rhonda Ramos DOB: 08-02-1965 MRN: [id number] PCP: Rene Paci, MD, MD ADMIT DATE: 08/06/2011 LOS: 11  PCCM PROGRESS NOTE  Brief Patient Profile: Patient is a 46yo F with h/o chronic pain syndrome related to spina bifida & scoliosis admitted on 08/06/11 to [facility] for increasing SOB. In Feb 2013, she underwent VATS for stapling of blebs with mech pleurodesis by Dr. Tyrone Sage. Patient was found to have right spontaneous pneumothorax and a right sided chest tube was placed by Dr. Sharlene Motts on 4/3. Patient had urinary tract infection diagnosed on 4/5 and was started on Cipro. Patient started having fever and chills starting yesterday (4/7). Fever was as high as 101 F on 4/7. A CXR was obtained which showed bibasilar atelactasis on 4/7. Chest tube was taken out on 4/7. Pt then developed ARDS and PNA and intubated 4/8.  Best Practice: DVT: Lovenox SUP: Protonix Nutrition: PO Glycemic control: None  Lines / Drains: Right IJ 4/8>>>  ETT 4/8>>> 08/15/11  Aline >. ? Date >> 08/14/2011  Micro:  Lab 08/13/11 0430 08/11/11 2129  LATICACIDVEN 0.7 0.9   Recent Results (from the past 240 hour(s))  MRSA PCR SCREENING     Status: Abnormal   Collection Time   08/07/11  9:07 AM      Component Value Range Status Comment   MRSA by PCR POSITIVE (*) NEGATIVE  Final   CULTURE, BLOOD (ROUTINE X 2)     Status: Normal (Preliminary result)   Collection Time   08/11/11  9:35 AM      Component Value Range Status Comment   Specimen Description BLOOD LEFT ARM   Final    Special Requests     Final    Value: BOTTLES DRAWN AEROBIC ONLY 10CC PATIENT ON FOLLOWING CIPRO   Culture  Setup Time [id number]   Final    Culture     Final    Value:        BLOOD CULTURE RECEIVED NO GROWTH TO DATE CULTURE WILL BE HELD FOR 5 DAYS BEFORE ISSUING A FINAL NEGATIVE REPORT   Report Status PENDING   Incomplete   CULTURE, BLOOD (ROUTINE X 2)     Status: Normal (Preliminary result)   Collection Time   08/11/11  9:40 AM      Component  Value Range Status Comment   Specimen Description BLOOD LEFT HAND   Final    Special Requests     Final    Value: BOTTLES DRAWN AEROBIC AND ANAEROBIC 10CC BLUE,5CC RED PATIENT ON FOLLOWING CIPRO   Culture  Setup Time [id number]   Final    Culture     Final    Value:        BLOOD CULTURE RECEIVED NO GROWTH TO DATE CULTURE WILL BE HELD FOR 5 DAYS BEFORE ISSUING A FINAL NEGATIVE REPORT   Report Status PENDING   Incomplete   CULTURE, BLOOD (ROUTINE X 2)     Status: Normal (Preliminary result)   Collection Time   08/11/11  9:04 PM      Component Value Range Status Comment   Specimen Description BLOOD LEFT HAND   Final    Special Requests BOTTLES DRAWN AEROBIC ONLY 10CC   Final    Culture  Setup Time [id number]   Final    Culture     Final    Value:        BLOOD CULTURE RECEIVED NO GROWTH TO DATE CULTURE WILL BE HELD FOR 5 DAYS BEFORE ISSUING A FINAL NEGATIVE REPORT  Report Status PENDING   Incomplete   CULTURE, BLOOD (ROUTINE X 2)     Status: Normal (Preliminary result)   Collection Time   08/11/11  9:23 PM      Component Value Range Status Comment   Specimen Description BLOOD RIGHT HAND   Final    Special Requests BOTTLES DRAWN AEROBIC ONLY [facility]   Final    Culture  Setup Time [id number]   Final    Culture     Final    Value:        BLOOD CULTURE RECEIVED NO GROWTH TO DATE CULTURE WILL BE HELD FOR 5 DAYS BEFORE ISSUING A FINAL NEGATIVE REPORT   Report Status PENDING   Incomplete   URINE CULTURE     Status: Normal   Collection Time   08/12/11  4:32 AM      Component Value Range Status Comment   Specimen Description URINE, CATHETERIZED   Final    Special Requests NONE   Final    Culture  Setup Time [id number]   Final    Colony Count NO GROWTH   Final    Culture NO GROWTH   Final    Report Status 08/13/2011 FINAL   Final     Antibiotics: Anti-infectives     Start     Dose/Rate Route Frequency Ordered Stop   08/13/11 1000   vancomycin (VANCOCIN) 500 mg in sodium chloride 0.9 % 100  mL IVPB  Status:  Discontinued        500 mg 100 mL/hr over 60 Minutes Intravenous Every 12 hours 08/13/11 0929 08/17/11 0733   08/12/11 1600   vancomycin (VANCOCIN) IVPB 1000 mg/200 mL premix  Status:  Discontinued        1,000 mg 200 mL/hr over 60 Minutes Intravenous Every 24 hours 08/11/11 2107 08/13/11 0929   08/12/11 0000   piperacillin-tazobactam (ZOSYN) IVPB 3.375 g  Status:  Discontinued        3.375 g 12.5 mL/hr over 240 Minutes Intravenous 3 times per day 08/11/11 2107 08/17/11 0734   08/11/11 2200   levofloxacin (LEVAQUIN) IVPB 750 mg  Status:  Discontinued        750 mg 100 mL/hr over 90 Minutes Intravenous Every 24 hours 08/11/11 1420 08/11/11 2048   08/11/11 2100   piperacillin-tazobactam (ZOSYN) IVPB 3.375 g  Status:  Discontinued        3.375 g 100 mL/hr over 30 Minutes Intravenous  Once 08/11/11 2048 08/11/11 2103   08/11/11 2100   vancomycin (VANCOCIN) IVPB 1000 mg/200 mL premix  Status:  Discontinued        1,000 mg 200 mL/hr over 60 Minutes Intravenous  Once 08/11/11 2048 08/11/11 2049   08/11/11 1600   piperacillin-tazobactam (ZOSYN) IVPB 3.375 g  Status:  Discontinued        3.375 g 12.5 mL/hr over 240 Minutes Intravenous Every 8 hours 08/11/11 1420 08/11/11 2048   08/11/11 1500   vancomycin (VANCOCIN) IVPB 1000 mg/200 mL premix  Status:  Discontinued        1,000 mg 200 mL/hr over 60 Minutes Intravenous Every 24 hours 08/11/11 1420 08/11/11 2048   08/08/11 1430   ciprofloxacin (CIPRO) tablet 500 mg  Status:  Discontinued        500 mg Oral 2 times daily 08/08/11 1345 08/11/11 1344          Tests / Events: Chest CT 4/3>>new pneumothrax  4/7 CT out CXR 4/8>>right lower lobe opacity 08/12/11 > start  48h nimbex for severe ARDS 08/14/11 >>CT chest 08/14/2011 >> RLL consolidation with loculated small effusion (does not warrant Chest tue per CVTS), Some ARDS bilateral LL, and very small left ptx (new)    Consults: CTCS, TRH  Subjective/Overnight/Interval  History: Pt follows commands, has some agitation overnight, no other major events.   Vital Signs: Temp:  [98.1 F (36.7 C)-98.9 F (37.2 C)] 98.3 F (36.8 C) (04/14 0758) Pulse Rate:  [93-114] 99  (04/14 0700) Resp:  [21-31] 23  (04/14 0700) BP: (112-136)/(71-89) 112/79 mmHg (04/14 0700) SpO2:  [94 %-100 %] 97 % (04/14 0700) I/O last 3 completed shifts: In: 2890 [P.O.:1920; I.V.:370; IV Piggyback:600] Out: 5275 [Urine:5275]  Physical Examination: Last febrile at 1440 on 4/8 General: no acute distress Neuro:  Awake, follows commands, +corneal and pupillary reflex HEENT: Intubated   Cardiovascular:  tachycardic Lungs:   improved aeration b/l, Breath sounds on R < L Abdomen:  Soft, NT/ND, normoactive BS   Ventilator settings:  None  Labs/Radiology: Please see A and P for full labs  Dg Chest Port 1 View  08/17/2011  *RADIOLOGY REPORT*  Clinical Data: ARDS and pleural effusion. Follow-up pneumothorax.  PORTABLE CHEST - 1 VIEW  Comparison: 08/16/2011  Findings: This is a low-volume film with moderate bibasilar atelectasis. A right pleural effusion is again identified. A right IJ central venous catheter with tip overlying the mid SVC again noted. There is no evidence of pneumothorax. There is been little interval change since prior study.  IMPRESSION: Stable chest radiograph.  No evidence of pneumothorax.  Original Report Authenticated By: Rosendo Gros, M.D.   Dg Chest Port 1 View  08/16/2011  *RADIOLOGY REPORT*  Clinical Data: Post right thoracentesis  PORTABLE CHEST - 1 VIEW  Comparison: Chest radiograph 08/16/2011  Findings: Right central venous line is unchanged.  Stable cardiac silhouette.  There is a modest decrease in right pleural effusion. There are low lung volumes.  No pneumothorax appreciated.  Small left effusion and low lung volumes are stable.  IMPRESSION:  1.  No evidence of pneumothorax following right thoracentesis.  2.  Mild  decrease in pleural effusion.  Original  Report Authenticated By: Genevive Bi, M.D.   Dg Chest Port 1 View  08/16/2011  *RADIOLOGY REPORT*  Clinical Data: Thoracentesis.  PORTABLE CHEST - 1 VIEW  Comparison: 08/16/2011  Findings: Right IJ line tip projects over the SVC.  Very low lung volumes are again noted.  No pneumothorax observed; the previous left apical pneumothorax is no longer readily visualized.  Large right pleural effusion may be mildly reduced in size compared to the prior exam but remains large, with associated passive atelectasis.  There is atelectasis at the left lung base as well.  Cardiac margins are obscured.  IMPRESSION:  1.  Potentially reduced right pleural effusion, although a large pleural effusion persist.  There is associated passive atelectasis. No pneumothorax. 2.  Stable atelectasis at the left lung base.  Original Report Authenticated By: Dellia Cloud, M.D.   Dg Chest Port 1 View  08/16/2011  *RADIOLOGY REPORT*  Clinical Data: Lung disease. Recently intubated patient.  PORTABLE CHEST - 1 VIEW  Comparison:   08/15/2011 and 08/14/2011.  Findings: 0530 hours. There has been apparent interval extubation and removal of the nasogastric tube.  Right IJ central venous catheter is unchanged in the mid SVC.  There are right greater than left pleural effusions with associated bibasilar air space opacities.  The right pleural effusion and basilar pulmonary opacity appear worse.  Previously noted left pneumothorax is not well visualized but may have a minimal residual apical component. Heart size and mediastinal contours are stable.  There is a severe convex left thoracolumbar scoliosis.  IMPRESSION: Worsening right pleural effusion and basilar air space disease following extubation. Probable minimal residual left apical pneumothorax.  Original Report Authenticated By: Gerrianne Scale, M.D.   US Thoracentesis Asp Pleural Space W/img Guide  08/16/2011  *RADIOLOGY REPORT*  Clinical Data:  Right pleural effusion.   Request has been made for diagnostic right-sided thoracentesis.  ULTRASOUND GUIDED right THORACENTESIS  Comparison:  Prior CT imaging of the chest.  An ultrasound guided thoracentesis was thoroughly discussed with the patient and questions answered.  The benefits, risks, alternatives and complications were also discussed.  The patient understands and wishes to proceed with the procedure.  Written consent was obtained.  Ultrasound was performed to localize and mark an adequate pocket of fluid in the right chest.  The area was then prepped and draped in the normal sterile fashion.  1% Lidocaine was used for local anesthesia.  Under ultrasound guidance a 19 gauge Yueh catheter was introduced.  Thoracentesis was performed.  The catheter was removed and a dressing applied.  Complications:  None immediate  Findings: A total of approximately 90 ml of bloody serous fluid was removed. A fluid sample was sent for laboratory analysis.  IMPRESSION: Successful ultrasound guided right thoracentesis yielding 90 ml of pleural fluid.  Read by: Anselm Pancoast, P.A.-C  Original Report Authenticated By: Richarda Overlie, M.D.    ASSESSMENT AND PLAN  RESPIRATORY  Lab 08/15/11 0505 08/13/11 1049 08/13/11 0449 08/12/11 2109 08/12/11 1842  PHART 7.433* 7.456* 7.464* 7.384 7.313*  PCO2ART 49.7* 38.3 35.7 43.3 54.4*  PO2ART 75.0* 56.0* 65.0* 72.0* 107.0*  HCO3 33.0* 27.0* 25.6* 25.9* 27.2*  O2SAT 94.0 90.0 94.0 94.0 97.0   A:   Acute hypoxemic resp failure/ARDS d/t HCAP v UTI.   Hx of RECURRENT PTX  - RLL consolidation and effusion CT chest 08/14/11>>worsened.    - Left basal new small ptx on ct chest 08/14/11 and stable on cxr 08/15/11             - Dr. Delford Field attempted R effusion tap on 4/13 without much return, likely due to loculation P: Titrate oxygen , diuresis, transfer to SDU  CARDIAC  Lab 08/12/11 1000 08/11/11 2129  TROPONINI -- <0.30  PROBNP 297.7* --   A:  No acute issues. P: Continue to  monitor  NEUROLOGIC A: RASS 1, overnight was agitated.  P: prn fentanyl, restart home klonazepam  VASCULAR/HEMODYNAMIC A:  Pt remains slightly hypotensive, Left cephalic thrombosis.       Left arm swelling, pt on proph heparin,  P: Continue to monitor,    INFECTIOUS DISEASE  Lab 08/17/11 0443 08/16/11 0500 08/15/11 0420 08/14/11 0730 08/13/11 0430 08/11/11 2129  WBC 12.3* 16.2* 8.9 7.5 7.6 --  PROCALCITON -- -- -- -- 3.02 2.09   A:  WBC trending down;all cultures have been negative thus far, and has been on antibiotics since admission.  P: d/c vanc and zosyn today. For ABX holiday   RENAL  Lab 08/17/11 0443 08/16/11 0500 08/15/11 0420 08/14/11 0730 08/13/11 0430  BUN 26* 20 26* 22 14  CREATININE 1.21* 1.05 1.04 0.99 0.78   I/O last 3 completed shifts: In: 2890 [P.O.:1920; I.V.:370; IV Piggyback:600] Out: 5275 [Urine:5275] A:  No acute issues, creatinine and BUN trending up secondary to diuresis with Lasix P: Reduce lasix to  80mg  daily and continue to monitor  ELECTROLYTES  Lab 08/17/11 0443 08/16/11 0500 08/15/11 0420 08/14/11 0730 08/13/11 0430 08/12/11 0421  NA 137 138 144 139 134* --  K 3.3* 3.3* -- -- -- --  CL 91* 90* 104 101 99 --  CO2 34* 37* 32 28 28 --  BUN 26* 20 26* 22 14 --  CREATININE 1.21* 1.05 1.04 0.99 0.78 --  CALCIUM 10.5 9.9 8.8 8.4 8.5 --  MG -- -- 2.1 -- -- 1.8  PHOS -- -- 4.0 -- -- --   A:  No acute issues, K slightly low at 3.3 P: Give KCL and continue to monitor  HEMATOLOGIC  Lab 08/17/11 0443 08/16/11 0500 08/15/11 0420 08/14/11 0730 08/13/11 0430  HGB 12.0 10.7* 8.5* 8.8* 9.1*  HCT 36.1 32.6* 26.7* 27.3* 27.8*  PLT 579* 455* 314 281 229  INR -- -- -- -- --  APTT -- -- -- -- --   A:  Anemia of critical illness P: PRBC for hgb < 7gm% unless actively bleeding or in MI  GASTROINTESTINAL  Lab 08/16/11 0500 08/11/11 2129  AST -- 21  ALT -- 11  ALKPHOS -- 85  BILITOT -- 0.6  PROT 7.2 6.6  ALBUMIN -- 2.7*   A:   Hypoalbuminemic, likely d/t chronic disease and probable decreased PO intake P: adv diet PO  ENDOCRINE  Lab 08/11/11 1847  GLUCAP 105*  TSH --  CORTISOL --   A:  No active issues. Random cortisol was 17.6 (21:29), which is slightly above PM range as expected. P: Continue to monitor  DERMATOLOGY A: No active issues. P: Continue to monitor.  SYMPTOMS: At this time, patient has no complaints. Transfer to SDU  GLOBAL: Full code.    TOBBIA,Bexley Mclester 8:23 AM    I have seen and examined this patient with the resident and agree with the above assessment and plan.     Shan Levans Beeper  [phone]  Cell  [phone]  If no response or cell goes to voicemail, call beeper [phone]

## 2011-08-17 NOTE — Progress Notes (Addendum)
Subjective:   Ms. Lurie states she is scared this morning.    Objective:  Vital Signs in the last 24 hours: Temp:  [98.1 F (36.7 C)-98.9 F (37.2 C)] 98.3 F (36.8 C) (04/14 0758) Pulse Rate:  [99-114] 99  (04/14 0700) Resp:  [21-31] 23  (04/14 0700) BP: (112-136)/(71-86) 112/79 mmHg (04/14 0700) SpO2:  [94 %-100 %] 97 % (04/14 0853)  Intake/Output from previous day: 04/13 0701 - 04/14 0700 In: 1520 [P.O.:840; I.V.:230; IV Piggyback:450] Out: 2875 [Urine:2875] Intake/Output from this shift: Total I/O In: 80 [I.V.:80] Out: -   Physical Exam: General appearance: alert, cooperative and no distress Lungs: diminished breath sounds right lung, absent right base Heart: regular rate and rhythm Abdomen: soft, non-tender; bowel sounds normal; no masses,  no organomegaly Skin: chest tube sites clean and dry  Lab Results:  Basename 08/17/11 0443 08/16/11 0500  WBC 12.3* 16.2*  HGB 12.0 10.7*  PLT 579* 455*    Basename 08/17/11 0443 08/16/11 0500  NA 137 138  K 3.3* 3.3*  CL 91* 90*  CO2 34* 37*  GLUCOSE 107* 107*  BUN 26* 20  CREATININE 1.21* 1.05   No results found for this basename: TROPONINI:2,CK,MB:2 in the last 72 hours Hepatic Function Panel  Basename 08/16/11 0500  PROT 7.2  ALBUMIN --  AST --  ALT --  ALKPHOS --  BILITOT --  BILIDIR --  IBILI --   No results found for this basename: CHOL in the last 72 hours No results found for this basename: PROTIME in the last 72 hours  Imaging: Imaging results have been reviewed: continued right pleural effusion, does not appear worse than previous film   Assessment/Plan:   1. Pneumothorax resolved 2. Right sided pleural effusion- thoracentesis attempted yesterday, unable to remove much fluid, again would recommend IR drainage due to possible loculation 3. Plan- management per primary service, will follow intermittently  LOS: 11 days    BARRETT, ERIN 08/17/2011, 11:54 AM    patient examined and medical  record reviewed,agree with above note. VAN TRIGT III,Raileigh Sabater 08/17/2011

## 2011-08-17 NOTE — Progress Notes (Signed)
eLink Physician-Brief Progress Note Patient Name: Rhonda Ramos DOB: [birth year] MRN: [id number]  Date of Service  08/17/2011   HPI/Events of Note  H/O of anxiety/PTSD on klonopin at home.  Last PM received at least 3 doses of versed.  Patient in anxiety state again this PM.   eICU Interventions  Plan: 1 time dose of versed Evaluate for resumption of home meds for PTSD/Anxiety in AM   Intervention Category Minor Interventions: Agitation / anxiety - evaluation and management  Norvin Ohlin 08/17/2011, 1:26 AM

## 2011-08-18 DIAGNOSIS — G894 Chronic pain syndrome: Secondary | ICD-10-CM

## 2011-08-18 LAB — CBC
HCT: 36.8 % (ref 36.0–46.0)
Hemoglobin: 12.2 g/dL (ref 12.0–15.0)
MCHC: 33.2 g/dL (ref 30.0–36.0)
MCV: 90.6 fL (ref 78.0–100.0)
RDW: 15.5 % (ref 11.5–15.5)

## 2011-08-18 LAB — CULTURE, BLOOD (ROUTINE X 2)
Culture  Setup Time: 201304090131
Culture  Setup Time: 201304090131
Culture: NO GROWTH

## 2011-08-18 LAB — BASIC METABOLIC PANEL
BUN: 37 mg/dL — ABNORMAL HIGH (ref 6–23)
Chloride: 95 mEq/L — ABNORMAL LOW (ref 96–112)
Creatinine, Ser: 1.23 mg/dL — ABNORMAL HIGH (ref 0.50–1.10)
GFR calc Af Amer: 61 mL/min — ABNORMAL LOW (ref >90)
Glucose, Bld: 97 mg/dL (ref 70–99)

## 2011-08-18 MED ORDER — NORETHINDRONE 0.35 MG PO TABS
1.0000 | ORAL_TABLET | ORAL | Status: DC
  Administered 2011-08-19 – 2011-08-22 (×4): 0.35 mg via ORAL
  Filled 2011-08-18 (×5): qty 1

## 2011-08-18 MED ORDER — FUROSEMIDE 40 MG PO TABS
40.0000 mg | ORAL_TABLET | Freq: Every day | ORAL | Status: DC
  Administered 2011-08-19: 40 mg via ORAL
  Filled 2011-08-18: qty 1

## 2011-08-18 NOTE — Progress Notes (Signed)
Name: Rhonda Ramos DOB: 1965-09-11 MRN: [id number] PCP: Rene Paci, MD, MD ADMIT DATE: 08/06/2011 LOS: 12  PCCM PROGRESS NOTE  Brief Patient Profile: Patient is a 46yo F with h/o chronic pain syndrome related to spina bifida & scoliosis admitted on 08/06/11 to [facility] for increasing SOB. In Feb 2013, she underwent VATS for stapling of blebs with mech pleurodesis by Dr. Tyrone Sage. Patient was found to have right spontaneous pneumothorax and a right sided chest tube was placed by Dr. Sharlene Motts on 4/3. Patient had urinary tract infection diagnosed on 4/5 and was started on Cipro. Patient started having fever and chills starting yesterday (4/7). Fever was as high as 101 F on 4/7. A CXR was obtained which showed bibasilar atelactasis on 4/7. Chest tube was taken out on 4/7. Pt then developed ARDS and PNA and intubated 4/8.  Best Practice: DVT: Lovenox SUP: Protonix Nutrition: PO Glycemic control: None  Lines / Drains: Right IJ 4/8>>>  ETT 4/8>>> 08/15/11  Aline >. ? Date >> 08/14/2011  Micro:  Lab 08/13/11 0430 08/11/11 2129  LATICACIDVEN 0.7 0.9   Recent Results (from the past 240 hour(s))  CULTURE, BLOOD (ROUTINE X 2)     Status: Normal   Collection Time   08/11/11  9:35 AM      Component Value Range Status Comment   Specimen Description BLOOD LEFT ARM   Final    Special Requests     Final    Value: BOTTLES DRAWN AEROBIC ONLY 10CC PATIENT ON FOLLOWING CIPRO   Culture  Setup Time [id number]   Final    Culture NO GROWTH 5 DAYS   Final    Report Status 08/17/2011 FINAL   Final   CULTURE, BLOOD (ROUTINE X 2)     Status: Normal   Collection Time   08/11/11  9:40 AM      Component Value Range Status Comment   Specimen Description BLOOD LEFT HAND   Final    Special Requests     Final    Value: BOTTLES DRAWN AEROBIC AND ANAEROBIC 10CC BLUE,5CC RED PATIENT ON FOLLOWING CIPRO   Culture  Setup Time [id number]   Final    Culture NO GROWTH 5 DAYS   Final    Report Status 08/17/2011 FINAL   Final    CULTURE, BLOOD (ROUTINE X 2)     Status: Normal (Preliminary result)   Collection Time   08/11/11  9:04 PM      Component Value Range Status Comment   Specimen Description BLOOD LEFT HAND   Final    Special Requests BOTTLES DRAWN AEROBIC ONLY 10CC   Final    Culture  Setup Time [id number]   Final    Culture     Final    Value:        BLOOD CULTURE RECEIVED NO GROWTH TO DATE CULTURE WILL BE HELD FOR 5 DAYS BEFORE ISSUING A FINAL NEGATIVE REPORT   Report Status PENDING   Incomplete   CULTURE, BLOOD (ROUTINE X 2)     Status: Normal (Preliminary result)   Collection Time   08/11/11  9:23 PM      Component Value Range Status Comment   Specimen Description BLOOD RIGHT HAND   Final    Special Requests BOTTLES DRAWN AEROBIC ONLY [facility]   Final    Culture  Setup Time [id number]   Final    Culture     Final    Value:        BLOOD CULTURE  RECEIVED NO GROWTH TO DATE CULTURE WILL BE HELD FOR 5 DAYS BEFORE ISSUING A FINAL NEGATIVE REPORT   Report Status PENDING   Incomplete   URINE CULTURE     Status: Normal   Collection Time   08/12/11  4:32 AM      Component Value Range Status Comment   Specimen Description URINE, CATHETERIZED   Final    Special Requests NONE   Final    Culture  Setup Time [id number]   Final    Colony Count NO GROWTH   Final    Culture NO GROWTH   Final    Report Status 08/13/2011 FINAL   Final   BODY FLUID CULTURE     Status: Normal (Preliminary result)   Collection Time   08/16/11 12:03 PM      Component Value Range Status Comment   Specimen Description PLEURAL FLUID   Final    Special Requests   Final    Gram Stain     Final    Value: WBC PRESENT, PREDOMINANTLY PMN     NO ORGANISMS SEEN   Culture NO GROWTH 1 DAY   Final    Report Status PENDING   Incomplete     Antibiotics: Anti-infectives     Start     Dose/Rate Route Frequency Ordered Stop   08/13/11 1000   vancomycin (VANCOCIN) 500 mg in sodium chloride 0.9 % 100 mL IVPB  Status:  Discontinued        500  mg 100 mL/hr over 60 Minutes Intravenous Every 12 hours 08/13/11 0929 08/17/11 0733   08/12/11 1600   vancomycin (VANCOCIN) IVPB 1000 mg/200 mL premix  Status:  Discontinued        1,000 mg 200 mL/hr over 60 Minutes Intravenous Every 24 hours 08/11/11 2107 08/13/11 0929   08/12/11 0000   piperacillin-tazobactam (ZOSYN) IVPB 3.375 g  Status:  Discontinued        3.375 g 12.5 mL/hr over 240 Minutes Intravenous 3 times per day 08/11/11 2107 08/17/11 0734   08/11/11 2200   levofloxacin (LEVAQUIN) IVPB 750 mg  Status:  Discontinued        750 mg 100 mL/hr over 90 Minutes Intravenous Every 24 hours 08/11/11 1420 08/11/11 2048   08/11/11 2100   piperacillin-tazobactam (ZOSYN) IVPB 3.375 g  Status:  Discontinued        3.375 g 100 mL/hr over 30 Minutes Intravenous  Once 08/11/11 2048 08/11/11 2103   08/11/11 2100   vancomycin (VANCOCIN) IVPB 1000 mg/200 mL premix  Status:  Discontinued        1,000 mg 200 mL/hr over 60 Minutes Intravenous  Once 08/11/11 2048 08/11/11 2049   08/11/11 1600   piperacillin-tazobactam (ZOSYN) IVPB 3.375 g  Status:  Discontinued        3.375 g 12.5 mL/hr over 240 Minutes Intravenous Every 8 hours 08/11/11 1420 08/11/11 2048   08/11/11 1500   vancomycin (VANCOCIN) IVPB 1000 mg/200 mL premix  Status:  Discontinued        1,000 mg 200 mL/hr over 60 Minutes Intravenous Every 24 hours 08/11/11 1420 08/11/11 2048   08/08/11 1430   ciprofloxacin (CIPRO) tablet 500 mg  Status:  Discontinued        500 mg Oral 2 times daily 08/08/11 1345 08/11/11 1344          Tests / Events: Chest CT 4/3>>new pneumothrax  4/7 CT out CXR 4/8>>right lower lobe opacity 08/12/11 > start 48h nimbex for  severe ARDS 08/14/11 >>CT chest 08/14/2011 >> RLL consolidation with loculated small effusion (does not warrant Chest tue per CVTS), Some ARDS bilateral LL, and very small left ptx (new)    Consults: CTCS, TRH  Subjective/Overnight/Interval History: Pt follows commands, breathing  well overnight, no other major events.   Vital Signs: Temp:  [98.1 F (36.7 C)-99.5 F (37.5 C)] 98.1 F (36.7 C) (04/15 0856) Pulse Rate:  [101-129] 101  (04/15 1000) Resp:  [22-41] 29  (04/15 1000) BP: (108-124)/(65-74) 123/74 mmHg (04/14 1800) SpO2:  [93 %-99 %] 97 % (04/15 1000) Weight:  [97 lb 1.6 oz (44.044 kg)] 97 lb 1.6 oz (44.044 kg) (04/15 0500) I/O last 3 completed shifts: In: 1210 [P.O.:480; I.V.:580; IV Piggyback:150] Out: 3150 [Urine:3150]  Physical Examination: Last febrile at 1440 on 4/8 General: no acute distress Neuro:  Awake, follows commands, +corneal and pupillary reflex HEENT: on nasal cannula Cardiovascular:  tachycardic Lungs:   improved aeration b/l, Breath sounds on R < L Abdomen:  Soft, NT/ND, normoactive BS   Ventilator settings:  None  Labs/Radiology: Please see A and P for full labs  Dg Chest Port 1 View  08/17/2011  *RADIOLOGY REPORT*  Clinical Data: ARDS and pleural effusion. Follow-up pneumothorax.  PORTABLE CHEST - 1 VIEW  Comparison: 08/16/2011  Findings: This is a low-volume film with moderate bibasilar atelectasis. A right pleural effusion is again identified. A right IJ central venous catheter with tip overlying the mid SVC again noted. There is no evidence of pneumothorax. There is been little interval change since prior study.  IMPRESSION: Stable chest radiograph.  No evidence of pneumothorax.  Original Report Authenticated By: Rosendo Gros, M.D.   Dg Chest Port 1 View  08/16/2011  *RADIOLOGY REPORT*  Clinical Data: Post right thoracentesis  PORTABLE CHEST - 1 VIEW  Comparison: Chest radiograph 08/16/2011  Findings: Right central venous line is unchanged.  Stable cardiac silhouette.  There is a modest decrease in right pleural effusion. There are low lung volumes.  No pneumothorax appreciated.  Small left effusion and low lung volumes are stable.  IMPRESSION:  1.  No evidence of pneumothorax following right thoracentesis.  2.  Mild   decrease in pleural effusion.  Original Report Authenticated By: Genevive Bi, M.D.   US Thoracentesis Asp Pleural Space W/img Guide  08/16/2011  *RADIOLOGY REPORT*  Clinical Data:  Right pleural effusion.  Request has been made for diagnostic right-sided thoracentesis.  ULTRASOUND GUIDED right THORACENTESIS  Comparison:  Prior CT imaging of the chest.  An ultrasound guided thoracentesis was thoroughly discussed with the patient and questions answered.  The benefits, risks, alternatives and complications were also discussed.  The patient understands and wishes to proceed with the procedure.  Written consent was obtained.  Ultrasound was performed to localize and mark an adequate pocket of fluid in the right chest.  The area was then prepped and draped in the normal sterile fashion.  1% Lidocaine was used for local anesthesia.  Under ultrasound guidance a 19 gauge Yueh catheter was introduced.  Thoracentesis was performed.  The catheter was removed and a dressing applied.  Complications:  None immediate  Findings: A total of approximately 90 ml of bloody serous fluid was removed. A fluid sample was sent for laboratory analysis.  IMPRESSION: Successful ultrasound guided right thoracentesis yielding 90 ml of pleural fluid.  Read by: Anselm Pancoast, P.A.-C  Original Report Authenticated By: Richarda Overlie, M.D.    ASSESSMENT AND PLAN  RESPIRATORY  Lab 08/15/11 0505  08/13/11 1049 08/13/11 0449 08/12/11 2109 08/12/11 1842  PHART 7.433* 7.456* 7.464* 7.384 7.313*  PCO2ART 49.7* 38.3 35.7 43.3 54.4*  PO2ART 75.0* 56.0* 65.0* 72.0* 107.0*  HCO3 33.0* 27.0* 25.6* 25.9* 27.2*  O2SAT 94.0 90.0 94.0 94.0 97.0   A:   Acute hypoxemic resp failure/ARDS d/t HCAP v UTI.   Hx of RECURRENT PTX  - RLL consolidation and effusion CT chest 08/14/11>>worsened.    - Left basal new small ptx on ct chest 08/14/11 and stable on cxr 08/15/11             - Dr. Delford Field attempted R effusion tap on 4/13 without much return, likely  due to loculation  P: Will [place] chest ourselves at bedside and assesss utility repeat tap with [place] at bedside, IS, lasix to neg baalnce  CARDIAC  Lab 08/12/11 1000 08/11/11 2129  TROPONINI -- <0.30  PROBNP 297.7* --   A:  No acute issues. P: Continue to monitor, tolerating lasix  NEUROLOGIC A: RASS 1, overnight was agitated.  P: prn fentanyl,home klonazepam, PT consult on going   INFECTIOUS DISEASE  Lab 08/18/11 0500 08/17/11 0443 08/16/11 0500 08/15/11 0420 08/14/11 0730 08/13/11 0430 08/11/11 2129  WBC 16.7* 12.3* 16.2* 8.9 7.5 -- --  PROCALCITON -- -- -- -- -- 3.02 2.09   A:  WBC trending down;all cultures have been negative thus far, and has been on antibiotics since admission.  P: d/c vanc and zosyn. For ABX holiday, gollow pleural tap culture   RENAL  Lab 08/18/11 0500 08/17/11 0443 08/16/11 0500 08/15/11 0420 08/14/11 0730  BUN 37* 26* 20 26* 22  CREATININE 1.23* 1.21* 1.05 1.04 0.99   I/O last 3 completed shifts: In: 1210 [P.O.:480; I.V.:580; IV Piggyback:150] Out: 3150 [Urine:3150] A:  No acute issues, creatinine and BUN trending up secondary to diuresis with Lasix P: Reduce lasix   ELECTROLYTES  Lab 08/18/11 0500 08/17/11 0443 08/16/11 0500 08/15/11 0420 08/14/11 0730 08/12/11 0421  NA 134* 137 138 144 139 --  K 3.7 3.3* -- -- -- --  CL 95* 91* 90* 104 101 --  CO2 29 34* 37* 32 28 --  BUN 37* 26* 20 26* 22 --  CREATININE 1.23* 1.21* 1.05 1.04 0.99 --  CALCIUM 10.3 10.5 9.9 8.8 8.4 --  MG -- -- -- 2.1 -- 1.8  PHOS -- -- -- 4.0 -- --   A:  No acute issues, K resolved P: Monitor in am , lasix reduction  HEMATOLOGIC  Lab 08/18/11 0500 08/17/11 0443 08/16/11 0500 08/15/11 0420 08/14/11 0730  HGB 12.2 12.0 10.7* 8.5* 8.8*  HCT 36.8 36.1 32.6* 26.7* 27.3*  PLT 703* 579* 455* 314 281  INR -- -- -- -- --  APTT -- -- -- -- --   A:  Anemia of critical illness P: PRBC for hgb < 7gm% unless actively bleeding or in MI, coags last  reviewed  GASTROINTESTINAL  Lab 08/16/11 0500 08/11/11 2129  AST -- 21  ALT -- 11  ALKPHOS -- 85  BILITOT -- 0.6  PROT 7.2 6.6  ALBUMIN -- 2.7*   A:  Hypoalbuminemic, likely d/t chronic disease and probable decreased PO intake P: adv diet PO, ppi  ENDOCRINE  Lab 08/11/11 1847  GLUCAP 105*  TSH --  CORTISOL --   A:  No active issues. Random cortisol was 17.6 (21:29), which is slightly above PM range as expected. P: Continue to monitor  DERMATOLOGY A: No active issues. P: Continue to monitor.  SYMPTOMS:  At this time, patient has no complaints. Transfer to floor tele O2 sat  GLOBAL: Full code.    Genella Mech 10:32 AM, 08/18/2011 [phone] PGY-1  I have fully examined this patient and agree with above findings.    And edited in full.  Mcarthur Rossetti. Tyson Alias, MD, FACP Pgr: [phone] [facility]

## 2011-08-18 NOTE — Progress Notes (Signed)
TCTS BRIEF PROGRESS NOTE   Ms Ferraz continues to improve clinically and her pneumothorax has not recurred.  Will follow intermittently, but please call if specific problems or questions arise.   Cathi Hazan H 08/18/2011 5:56 PM

## 2011-08-19 LAB — BODY FLUID CULTURE: Culture: NO GROWTH

## 2011-08-19 LAB — BASIC METABOLIC PANEL
BUN: 35 mg/dL — ABNORMAL HIGH (ref 6–23)
CO2: 24 mEq/L (ref 19–32)
Calcium: 9.7 mg/dL (ref 8.4–10.5)
Creatinine, Ser: 1.09 mg/dL (ref 0.50–1.10)
GFR calc non Af Amer: 61 mL/min — ABNORMAL LOW (ref >90)
Glucose, Bld: 87 mg/dL (ref 70–99)
Sodium: 136 mEq/L (ref 135–145)

## 2011-08-19 LAB — CBC
Hemoglobin: 11.4 g/dL — ABNORMAL LOW (ref 12.0–15.0)
MCH: 29.6 pg (ref 26.0–34.0)
MCHC: 32.8 g/dL (ref 30.0–36.0)
MCV: 90.4 fL (ref 78.0–100.0)
RBC: 3.85 MIL/uL — ABNORMAL LOW (ref 3.87–5.11)

## 2011-08-19 NOTE — Progress Notes (Signed)
Name: Rhonda Ramos DOB: 05/03/1966 MRN: [id number] PCP: Rene Paci, MD, MD ADMIT DATE: 08/06/2011 LOS: 13  PCCM PROGRESS NOTE  Brief Patient Profile:  46yo F with h/o chronic pain syndrome related to spina bifida & scoliosis admitted on 08/06/11 to [facility] for increasing SOB. In Feb 2013, she underwent VATS for stapling of blebs with mech pleurodesis by Dr. Tyrone Sage. Patient was found to have right spontaneous pneumothorax and a right sided chest tube was placed by Dr. Cornelius Moras on 4/3. Patient had urinary tract infection diagnosed on 4/5 and was started on Cipro. Patient started having fever and chills starting 4/7 as high as 101 F on 4/7. A CXR was obtained which showed bibasilar atelactasis on 4/7. Chest tube was taken out on 4/7. Pt then developed ARDS and PNA and intubated 4/8.  Best Practice: DVT: Lovenox SUP: Protonix Nutrition: PO Glycemic control: None  Lines / Drains: Right IJ 4/8>>>  ETT 4/8>>> 08/15/11 Aline >. ? Date >> 08/14/2011  Micro:  Lab 08/13/11 0430  LATICACIDVEN 0.7   Recent Results (from the past 240 hour(s))  CULTURE, BLOOD (ROUTINE X 2)     Status: Normal   Collection Time   08/11/11  9:35 AM      Component Value Range Status Comment   Specimen Description BLOOD LEFT ARM   Final    Special Requests     Final    Value: BOTTLES DRAWN AEROBIC ONLY 10CC PATIENT ON FOLLOWING CIPRO   Culture  Setup Time [id number]   Final    Culture NO GROWTH 5 DAYS   Final    Report Status 08/17/2011 FINAL   Final   CULTURE, BLOOD (ROUTINE X 2)     Status: Normal   Collection Time   08/11/11  9:40 AM      Component Value Range Status Comment   Specimen Description BLOOD LEFT HAND   Final    Special Requests     Final    Value: BOTTLES DRAWN AEROBIC AND ANAEROBIC 10CC BLUE,5CC RED PATIENT ON FOLLOWING CIPRO   Culture  Setup Time [id number]   Final    Culture NO GROWTH 5 DAYS   Final    Report Status 08/17/2011 FINAL   Final   CULTURE, BLOOD (ROUTINE X 2)     Status: Normal   Collection Time   08/11/11  9:04 PM      Component Value Range Status Comment   Specimen Description BLOOD LEFT HAND   Final    Special Requests BOTTLES DRAWN AEROBIC ONLY 10CC   Final    Culture  Setup Time [id number]   Final    Culture NO GROWTH 5 DAYS   Final    Report Status 08/18/2011 FINAL   Final   CULTURE, BLOOD (ROUTINE X 2)     Status: Normal   Collection Time   08/11/11  9:23 PM      Component Value Range Status Comment   Specimen Description BLOOD RIGHT HAND   Final    Special Requests BOTTLES DRAWN AEROBIC ONLY [facility]   Final    Culture  Setup Time [id number]   Final    Culture NO GROWTH 5 DAYS   Final    Report Status 08/18/2011 FINAL   Final   URINE CULTURE     Status: Normal   Collection Time   08/12/11  4:32 AM      Component Value Range Status Comment   Specimen Description URINE, CATHETERIZED   Final  Special Requests NONE   Final    Culture  Setup Time [id number]   Final    Colony Count NO GROWTH   Final    Culture NO GROWTH   Final    Report Status 08/13/2011 FINAL   Final   BODY FLUID CULTURE     Status: Normal   Collection Time   08/16/11 12:03 PM      Component Value Range Status Comment   Specimen Description PLEURAL FLUID   Final    Special Requests   Final    Gram Stain     Final    Value: WBC PRESENT, PREDOMINANTLY PMN     NO ORGANISMS SEEN   Culture NO GROWTH 3 DAYS   Final    Report Status 08/19/2011 FINAL   Final     Antibiotics: Anti-infectives     Start     Dose/Rate Route Frequency Ordered Stop   08/13/11 1000   vancomycin (VANCOCIN) 500 mg in sodium chloride 0.9 % 100 mL IVPB  Status:  Discontinued        500 mg 100 mL/hr over 60 Minutes Intravenous Every 12 hours 08/13/11 0929 08/17/11 0733   08/12/11 1600   vancomycin (VANCOCIN) IVPB 1000 mg/200 mL premix  Status:  Discontinued        1,000 mg 200 mL/hr over 60 Minutes Intravenous Every 24 hours 08/11/11 2107 08/13/11 0929   08/12/11 0000   piperacillin-tazobactam (ZOSYN) IVPB  3.375 g  Status:  Discontinued        3.375 g 12.5 mL/hr over 240 Minutes Intravenous 3 times per day 08/11/11 2107 08/17/11 0734   08/11/11 2200   levofloxacin (LEVAQUIN) IVPB 750 mg  Status:  Discontinued        750 mg 100 mL/hr over 90 Minutes Intravenous Every 24 hours 08/11/11 1420 08/11/11 2048   08/11/11 2100   piperacillin-tazobactam (ZOSYN) IVPB 3.375 g  Status:  Discontinued        3.375 g 100 mL/hr over 30 Minutes Intravenous  Once 08/11/11 2048 08/11/11 2103   08/11/11 2100   vancomycin (VANCOCIN) IVPB 1000 mg/200 mL premix  Status:  Discontinued        1,000 mg 200 mL/hr over 60 Minutes Intravenous  Once 08/11/11 2048 08/11/11 2049   08/11/11 1600   piperacillin-tazobactam (ZOSYN) IVPB 3.375 g  Status:  Discontinued        3.375 g 12.5 mL/hr over 240 Minutes Intravenous Every 8 hours 08/11/11 1420 08/11/11 2048   08/11/11 1500   vancomycin (VANCOCIN) IVPB 1000 mg/200 mL premix  Status:  Discontinued        1,000 mg 200 mL/hr over 60 Minutes Intravenous Every 24 hours 08/11/11 1420 08/11/11 2048   08/08/11 1430   ciprofloxacin (CIPRO) tablet 500 mg  Status:  Discontinued        500 mg Oral 2 times daily 08/08/11 1345 08/11/11 1344          Tests / Events: Chest CT 4/3>>new pneumothrax  4/7 CT out CXR 4/8>>right lower lobe opacity 08/12/11 > start 48h nimbex for severe ARDS 08/14/11 >>CT chest 08/14/2011 >> RLL consolidation with loculated small effusion (does not warrant Chest tube per CVTS), Some ARDS bilateral LL, and very small left ptx (new)    Consults: CTCS, TRH  Subjective/Overnight/Interval History: No increased wob or desat on 4lpm  Vital Signs: BP 108/72  Pulse 100  Temp(Src) 98.3 F (36.8 C) (Oral)  Resp 18  Ht 4\' 8"  (1.422  m)  Wt 97 lb 1.6 oz (44.044 kg)  BMI 21.77 kg/m2  SpO2 96%   Physical Examination:  General: no acute distress Neuro:  Awake, follows commands, +corneal and pupillary reflex HEENT: on nasal cannula Cardiovascular:   tachycardic Lungs:   improved aeration b/l, Breath sounds on R < L Abdomen:  Soft, NT/ND, normoactive BS     Labs/Radiology: Please see A and P for full labs  No results found.  ASSESSMENT AND PLAN  RESPIRATORY  Lab 08/15/11 0505 08/13/11 1049 08/13/11 0449 08/12/11 2109  PHART 7.433* 7.456* 7.464* 7.384  PCO2ART 49.7* 38.3 35.7 43.3  PO2ART 75.0* 56.0* 65.0* 72.0*  HCO3 33.0* 27.0* 25.6* 25.9*  O2SAT 94.0 90.0 94.0 94.0   A:   Acute hypoxemic resp failure/ARDS d/t HCAP v UTI.   Hx of RECURRENT PTX  - RLL consolidation and effusion CT chest 08/14/11>>worsened.    - Left basal new small ptx on ct chest 08/14/11 and stable on cxr 08/15/11             - Dr. Delford Field attempted R effusion tap on 4/13 without much return, likely due to loculation    CARDIAC No results found for this basename: TROPONINI:5,PROBNP:5 in the last 168 hours A:  No acute issues. P: Continue to monitor, tolerating lasix     INFECTIOUS DISEASE  Lab 08/19/11 0554 08/18/11 0500 08/17/11 0443 08/16/11 0500 08/15/11 0420 08/13/11 0430  WBC 16.3* 16.7* 12.3* 16.2* 8.9 --  PROCALCITON -- -- -- -- -- 3.02   A:  WBC trending down;all cultures have been negative thus far P: d/c vanc and zosyn 4/13.  RENAL  Lab 08/19/11 0554 08/18/11 0500 08/17/11 0443 08/16/11 0500 08/15/11 0420  BUN 35* 37* 26* 20 26*  CREATININE 1.09 1.23* 1.21* 1.05 1.04    Intake/Output Summary (Last 24 hours) at 08/19/11 2028 Last data filed at 08/19/11 1300  Gross per 24 hour  Intake    600 ml  Output   1553 ml  Net   -953 ml    A:  No acute issues, creatinine and BUN trending up secondary to diuresis with Lasix Lasix d/c'd 4/16  ELECTROLYTES  Lab 08/19/11 0554 08/18/11 0500 08/17/11 0443 08/16/11 0500 08/15/11 0420  NA 136 134* 137 138 144  K 4.6 3.7 -- -- --  CL 97 95* 91* 90* 104  CO2 24 29 34* 37* 32  BUN 35* 37* 26* 20 26*  CREATININE 1.09 1.23* 1.21* 1.05 1.04  CALCIUM 9.7 10.3 10.5 9.9 8.8  MG -- -- -- --  2.1  PHOS -- -- -- -- 4.0   A:  No acute issues, K resolved   HEMATOLOGIC  Lab 08/19/11 0554 08/18/11 0500 08/17/11 0443 08/16/11 0500 08/15/11 0420  HGB 11.4* 12.2 12.0 10.7* 8.5*  HCT 34.8* 36.8 36.1 32.6* 26.7*  PLT 714* 703* 579* 455* 314  INR -- -- -- -- --  APTT -- -- -- -- --   A:  Anemia of critical illness P: PRBC for hgb < 7gm% unless actively bleeding or in MI   GASTROINTESTINAL  Lab 08/16/11 0500  AST --  ALT --  ALKPHOS --  BILITOT --  PROT 7.2  ALBUMIN --   A:  Hypoalbuminemic, likely d/t chronic disease and probable decreased PO intake/ protein calorie malnutrition P: adv diet PO, ppi  ENDOCRINE No results found for this basename: GLUCAP:5,TSH:5,CORTISOL:5 in the last 168 hours A:  No active issues. Random cortisol was 17.6 (21:29), which is slightly  above PM range as expected. P: Continue to monitor  DERMATOLOGY A: No active issues. P: Continue to monitor.    GLOBAL: Full code.    Sandrea Hughs, MD Pulmonary and Critical Care Medicine [facility] Cell [phone]

## 2011-08-19 NOTE — Progress Notes (Signed)
PT Cancellation Note   08/19/11 1226  PT Visit Information  Last PT Attempted On 08/19/11  Reason Eval/Treat Not Completed Patient refused    [facility] PT [phone]

## 2011-08-19 NOTE — Progress Notes (Signed)
Patient refused morning CXR twice, order discontinued.

## 2011-08-20 ENCOUNTER — Inpatient Hospital Stay (HOSPITAL_COMMUNITY): Payer: Medicare Other

## 2011-08-20 DIAGNOSIS — R5381 Other malaise: Secondary | ICD-10-CM

## 2011-08-20 DIAGNOSIS — R197 Diarrhea, unspecified: Secondary | ICD-10-CM | POA: Diagnosis not present

## 2011-08-20 DIAGNOSIS — Q059 Spina bifida, unspecified: Secondary | ICD-10-CM

## 2011-08-20 MED ORDER — FAMOTIDINE 20 MG PO TABS
20.0000 mg | ORAL_TABLET | Freq: Every day | ORAL | Status: DC
  Administered 2011-08-20 – 2011-08-21 (×2): 20 mg via ORAL
  Filled 2011-08-20 (×3): qty 1

## 2011-08-20 MED ORDER — BOOST / RESOURCE BREEZE PO LIQD: 1.0000 | Freq: Three times a day (TID) | ORAL | Status: DC

## 2011-08-20 MED ORDER — LOPERAMIDE HCL 2 MG PO CAPS
2.0000 mg | ORAL_CAPSULE | ORAL | Status: DC | PRN
  Administered 2011-08-20: 2 mg via ORAL
  Filled 2011-08-20: qty 1

## 2011-08-20 MED ORDER — FENTANYL 12 MCG/HR TD PT72
12.5000 ug | MEDICATED_PATCH | TRANSDERMAL | Status: DC
  Administered 2011-08-20: 12.5 ug via TRANSDERMAL
  Filled 2011-08-20: qty 1

## 2011-08-20 MED ORDER — OXYBUTYNIN CHLORIDE ER 10 MG PO TB24
10.0000 mg | ORAL_TABLET | Freq: Every day | ORAL | Status: DC
  Administered 2011-08-20 – 2011-08-21 (×3): 10 mg via ORAL
  Filled 2011-08-20 (×4): qty 1
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
## 2011-08-20 NOTE — Progress Notes (Signed)
Pt given shower by her sister. Pt has been to bedside commode multiple times.  Foley removed and pt has orders to in/out cath herself.  Supplies are at bedside.  Pt has fentanyl patch on left chest.  Pt has depends on for incontinence episodes.  Pt says she feels the best she has since she has been here.  Will continue to monitor.  Pt does not want CIR.  She would like to go home to her "babies".  Resting with call bell within reach. Thomas Hoff

## 2011-08-20 NOTE — Progress Notes (Signed)
Nutrition Follow-up  Pt extubated 4/12. S/p thoracentesis 4/13. Pt states she is "terrified of eating" due to diarrhea and she is embarrassed having to be changed. PO intake poor at 0-25% per flowsheet records. Would benefit from addition of nutritional supplements.  Diet Order:  Low Sodium  Meds: Scheduled Meds:   . antiseptic oral rinse  15 mL Mouth Rinse QID  . docusate sodium  100 mg Oral BID  . feeding supplement  30 mL Per Tube Q1200  . FLUoxetine  60 mg Oral Daily  . heparin  5,000 Units Subcutaneous Q8H  . levalbuterol  2 puff Inhalation Q8H  . levETIRAcetam  250 mg Per Tube Daily  . norethindrone  1 tablet Oral Q24H  . pantoprazole  40 mg Oral Q1200  . sodium chloride  3 mL Intravenous Q12H  . DISCONTD: furosemide  40 mg Oral Daily   Continuous Infusions:   . sodium chloride     PRN Meds:.acetaminophen, alum & mag hydroxide-simeth, clonazePAM, fentaNYL, ondansetron (ZOFRAN) IV, ondansetron, promethazine, sodium chloride  Labs:  CMP     Component Value Date/Time   NA 136 08/19/2011 0554   K 4.6 08/19/2011 0554   CL 97 08/19/2011 0554   CO2 24 08/19/2011 0554   GLUCOSE 87 08/19/2011 0554   BUN 35* 08/19/2011 0554   CREATININE 1.09 08/19/2011 0554   CALCIUM 9.7 08/19/2011 0554   PROT 7.2 08/16/2011 0500   ALBUMIN 2.7* 08/11/2011 2129   AST 21 08/11/2011 2129   ALT 11 08/11/2011 2129   ALKPHOS 85 08/11/2011 2129   BILITOT 0.6 08/11/2011 2129   GFRNONAA 61* 08/19/2011 0554   GFRAA 70* 08/19/2011 0554     Intake/Output Summary (Last 24 hours) at 08/20/11 1130 Last data filed at 08/20/11 1035  Gross per 24 hour  Intake    843 ml  Output   1702 ml  Net   -859 ml    Weight Status:  45.1 kg (4/17) -- trending down  Re-estimated needs:  1350-1550 kcals, 65-75 gm protein  Nutrition Dx:  Inadequate Oral Intake now r/t poor appetite & diarrhea as evidenced by PO 0-25%.  Status: Ongoing.  Goal:  Oral intake to meet >90% of estimated nutrition needs, unmet Monitor: PO intake, labs,  weight, I/O's  Intervention:    Add Nurse, adult supplement PO TID (250 kcals, 9 gm protein per 8 fl oz carton)  RD to follow for nutrition care plan   Alger Memos Pager #:  [phone]

## 2011-08-20 NOTE — Progress Notes (Signed)
Name: Rhonda Ramos DOB: 1965/11/01 MRN: [id number] PCP: Rene Paci, MD, MD ADMIT DATE: 08/06/2011 LOS: 14  PCCM PROGRESS NOTE  Brief Patient Profile:  46yo F with h/o chronic pain syndrome related to spina bifida & scoliosis admitted on 08/06/11 to [facility] for increasing SOB. In Feb 2013, she underwent VATS for stapling of blebs with mech pleurodesis by Dr. Tyrone Sage. Patient was found to have right spontaneous pneumothorax and a right sided chest tube was placed by Dr. Cornelius Moras on 4/3. Patient had urinary tract infection diagnosed on 4/5 and was started on Cipro. Patient started having fever and chills starting 4/7 as high as 101 F on 4/7. A CXR was obtained which showed bibasilar atelactasis on 4/7. Chest tube was taken out on 4/7. Pt then developed ARDS and PNA and intubated 4/8.  Best Practice: DVT: Lovenox SUP: Protonix changed top pepcid 4/17 due to diarrhea Nutrition: PO Glycemic control: None  Lines / Drains: Right IJ 4/8>>>  ETT 4/8>>> 08/15/11 Aline >  08/14/2011  Micro: BCx2 4/8>>> neg Urine 4/9>> neg  Pleural fluid 4/13>>> neg   Antibiotics: Vanc 4/8>>>4/14 Zosyn 4/8>>>4/14 Levaquin 4/8>>>4/8   Tests / Events: Chest CT 4/3>>new pneumothrax  4/7 CT out CXR 4/8>>right lower lobe opacity 08/12/11 > start 48h nimbex for severe ARDS 08/14/11 >>CT chest 08/14/2011 >> RLL consolidation with loculated small effusion (does not warrant Chest tube per CVTS), Some ARDS bilateral LL, and very small left ptx (new)   Consults: CTCS, TRH  Subjective/Overnight/Interval History: Refusing PT, CXR.  No SOB or increased WOB, now on RA per RN - new complaint is diarrhea s abd pain  Vital Signs: BP 107/74  Pulse 92  Temp(Src) 97.7 F (36.5 C) (Oral)  Resp 18  Ht 4\' 8"  (1.422 m)  Wt 99 lb 6.8 oz (45.1 kg)  BMI 22.29 kg/m2  SpO2 91% On RA.   Intake/Output Summary (Last 24 hours) at 08/20/11 0926 Last data filed at 08/20/11 4098  Gross per 24 hour  Intake    840 ml  Output   1702 ml   Net   -862 ml     Physical Examination:  General: no acute distress Neuro:  Very drowsy, arousable, follows commands HEENT: mm dry, no jvd Cardiovascular: s1s2 mild tachy Lungs:  resps even non labored on RA, diminished bases, few scattered ronchi Abdomen:  Soft, NT/ND, normoactive BS   CBC    Component Value Date/Time   WBC 16.3* 08/19/2011 0554   RBC 3.85* 08/19/2011 0554   HGB 11.4* 08/19/2011 0554   HCT 34.8* 08/19/2011 0554   PLT 714* 08/19/2011 0554   MCV 90.4 08/19/2011 0554   MCH 29.6 08/19/2011 0554   MCHC 32.8 08/19/2011 0554   RDW 15.5 08/19/2011 0554   LYMPHSABS 1.5 08/06/2011 1230   MONOABS 0.6 08/06/2011 1230   EOSABS 0.1 08/06/2011 1230   BASOSABS 0.1 08/06/2011 1230    BMET    Component Value Date/Time   NA 136 08/19/2011 0554   K 4.6 08/19/2011 0554   CL 97 08/19/2011 0554   CO2 24 08/19/2011 0554   GLUCOSE 87 08/19/2011 0554   BUN 35* 08/19/2011 0554   CREATININE 1.09 08/19/2011 0554   CALCIUM 9.7 08/19/2011 0554   GFRNONAA 61* 08/19/2011 0554   GFRAA 70* 08/19/2011 0554     CXR - Dg Chest 2 View  08/20/2011  *RADIOLOGY REPORT*  Clinical Data: Dyspnea  CHEST - 2 VIEW  Comparison: 08/17/2011  Findings: Removal of the right IJ central line.  Some interval improvement in bibasilar airspace disease/atelectasis.  Small right effusion remains.  No pneumothorax evident. Marked scoliosis of the spine.  IMPRESSION: Improving bibasilar airspace disease/atelectasis.  Persistent small right effusion.  Original Report Authenticated By: Judie Petit. Ruel Favors, M.D.    ASSESSMENT AND PLAN  Acute hypoxemic resp failure/ARDS d/t HCAP v UTI c/b hx recurrent ptx. Dr. Delford Field attempted R effusion tap on 4/13 without much return, likely due to loculation.  CXR improving.  Tol RA.  PLAN -  O2 as needed  pulm hygiene Int f/u CXR Off abx Lasix d/c 4/16  Recurrent ptx --Resolved. Seen by CVTS.  PLAN -  Int f/u cxr    INFECTIOUS DISEASE --  ?HCAP v UTI.  All cultures neg to date including  pleural fluid.  Off abx.  However WBC trending up 4/17.  PLAN -  Monitor wbc, fever curve off abx  Mild acute renal insufficiency -- likely in setting diuresis.  Scr trending down.  Lab 08/19/11 0554 08/18/11 0500 08/17/11 0443 08/16/11 0500 08/15/11 0420  CREATININE 1.09 1.23* 1.21* 1.05 1.04  Resolved  Anemia of critical illness -- no s/s bleeding.  PLAN -  PRBC for hgb < 7gm% unless actively bleeding or in MI   Protein calorie malnutrition --  PLAN -  tol po diet  PPI  Deconditioning -- likely chronic to some degree.  ?? Whether she is safe to go home.   PLAN -  PT recommending inpt rehab - Will consult Cont pt/ot   Disposition -- PT previously rec inpt rehab -- will consult 4/17   Diarrhea new c/o 4/17 Change to clear liquids 4/17  WHITEHEART,KATHRYN, NP 08/20/2011  9:32 AM Pager: [phone]  *Care during the described time interval was provided by me and/or other providers on the critical care team. I have reviewed this patient's available data, including medical history, events of note, physical examination and test results as part of my evaluation.

## 2011-08-20 NOTE — Progress Notes (Signed)
Physical Therapy Treatment Patient Details Name: Rhonda Ramos MRN: [id number] DOB: 10/05/1965 Today's Date: 08/20/2011  PT Assessment/Plan  PT - Assessment/Plan Comments on Treatment Session: Pt progressing well with mobility but fatigues very quickly and is experiencing generalized weakness and decreased balance from recent decrease in activity.  Still feel that she would benefit from short CIR stay before d/c.  PT will continue to follow. PT Plan: Discharge plan remains appropriate;Frequency remains appropriate PT Frequency: Min 3X/week Recommendations for Other Services: Rehab consult Follow Up Recommendations: Inpatient Rehab Equipment Recommended: Defer to next venue PT Goals  Acute Rehab PT Goals PT Goal Formulation: With patient Time For Goal Achievement: 2 weeks Pt will go Supine/Side to Sit: with modified independence;with HOB 0 degrees PT Goal: Supine/Side to Sit - Progress: Progressing toward goal Pt will go Sit to Supine/Side: with modified independence;with HOB 0 degrees PT Goal: Sit to Supine/Side - Progress: Progressing toward goal Pt will go Sit to Stand: with supervision PT Goal: Sit to Stand - Progress: Progressing toward goal Pt will go Stand to Sit: with supervision PT Goal: Stand to Sit - Progress: Progressing toward goal Pt will Transfer Bed to Chair/Chair to Bed: with supervision PT Transfer Goal: Bed to Chair/Chair to Bed - Progress: Progressing toward goal Pt will Ambulate: 51 - 150 feet;with supervision;with rolling walker PT Goal: Ambulate - Progress: Progressing toward goal  PT Treatment Precautions/Restrictions  Precautions Precautions: Fall Required Braces or Orthoses: Other Brace/Splint Other Brace/Splint: bilateral AFO's  Restrictions Weight Bearing Restrictions: No Mobility (including Balance) Bed Mobility Bed Mobility: Yes Supine to Sit: 6: Modified independent (Device/Increase time);HOB elevated (Comment degrees) (30 deg) Sitting - Scoot to  Edge of Bed: 6: Modified independent (Device/Increase time) Transfers Transfers: Yes Sit to Stand: From bed;With upper extremity assist;4: Min assist (min-guard A) Sit to Stand Details (indicate cue type and reason): pt stood to RW, somewhat impulsive, unsteady with initial standing Stand to Sit: 4: Min assist;To chair/3-in-1;With upper extremity assist;With armrests (min-guard A) Stand to Sit Details: pt performed sit to stand 2x, good control with sitting but difficulty scooting self back into chair Ambulation/Gait Ambulation/Gait: Yes Ambulation/Gait Assistance: 4: Min assist Ambulation/Gait Assistance Details (indicate cue type and reason): pt fatigues very quickly with ambulation and DOE 2-3/4, needed seated rest breaks.  Decreased safety with turns with vc's to stay within RW Ambulation Distance (Feet): 20 Feet (with seated rest after first 10') Assistive device: Rolling walker Gait Pattern: Decreased stride length;Right circumduction;Left circumduction (wide BOS) Gait velocity: decreased Stairs: No Wheelchair Mobility Wheelchair Mobility: No  Posture/Postural Control Posture/Postural Control: Postural limitations Postural Limitations: posture affected by long term effects of spina bifida  Balance Balance Assessed: Yes Dynamic Standing Balance Dynamic Standing - Balance Support: Bilateral upper extremity supported;During functional activity Dynamic Standing - Level of Assistance: 4: Min assist    End of Session PT - End of Session Equipment Utilized During Treatment: Gait belt;Left ankle foot orthosis;Right ankle foot orthosis Activity Tolerance: Patient limited by fatigue Patient left: in chair;with call bell in reach Nurse Communication: Mobility status for ambulation;Mobility status for transfers General Behavior During Session: Other (comment) (pt very emotional today about her condition) Cognition: [facility] for tasks performed Lyanne Co, PT  Acute Rehab Services   [phone]  Lyanne Co 08/20/2011, 3:00 PM

## 2011-08-21 LAB — CBC
MCH: 30.3 pg (ref 26.0–34.0)
MCHC: 33.5 g/dL (ref 30.0–36.0)
MCV: 90.3 fL (ref 78.0–100.0)
Platelets: 727 10*3/uL — ABNORMAL HIGH (ref 150–400)
RBC: 3.9 MIL/uL (ref 3.87–5.11)
RDW: 15.5 % (ref 11.5–15.5)

## 2011-08-21 LAB — BASIC METABOLIC PANEL
CO2: 23 mEq/L (ref 19–32)
Calcium: 10.2 mg/dL (ref 8.4–10.5)
Creatinine, Ser: 1.25 mg/dL — ABNORMAL HIGH (ref 0.50–1.10)
GFR calc Af Amer: 60 mL/min — ABNORMAL LOW (ref >90)
GFR calc non Af Amer: 52 mL/min — ABNORMAL LOW (ref >90)
Sodium: 137 mEq/L (ref 135–145)

## 2011-08-21 MED ORDER — FENTANYL 12 MCG/HR TD PT72: 25.0000 ug | MEDICATED_PATCH | TRANSDERMAL | Status: DC

## 2011-08-21 MED ORDER — FENTANYL 12 MCG/HR TD PT72
12.5000 ug | MEDICATED_PATCH | Freq: Once | TRANSDERMAL | Status: DC
  Administered 2011-08-21: 12.5 ug via TRANSDERMAL
  Filled 2011-08-21 (×2): qty 1

## 2011-08-21 NOTE — Progress Notes (Signed)
Physical Therapy Treatment Patient Details Name: Rhonda Ramos MRN: [id number] DOB: 17-Feb-1966 Today's Date: 08/21/2011  PT Assessment/Plan  PT - Assessment/Plan Comments on Treatment Session: Pt progressing well and states she is planning to go stay with her parents at discharge until she regains full activity tolerance. Pt very tearful and fearful that others perceive her to be med seeking. Pt reassured and very participative today. At parents house will have to ascend 3 steps to enter.  PT Plan: Discharge plan needs to be updated Follow Up Recommendations: Home health PT;Supervision - Intermittent Equipment Recommended: None recommended by PT PT Goals  Acute Rehab PT Goals PT Goal: Supine/Side to Sit - Progress: Met PT Goal: Sit to Supine/Side - Progress: Met Pt will go Sit to Stand: with modified independence PT Goal: Sit to Stand - Progress: Updated due to goal met Pt will go Stand to Sit: with modified independence PT Goal: Stand to Sit - Progress: Updated due to goals met PT Transfer Goal: Bed to Chair/Chair to Bed - Progress: Progressing toward goal PT Goal: Ambulate - Progress: Progressing toward goal Pt will Go Up / Down Stairs: 3-5 stairs;with min assist;with least restrictive assistive device PT Goal: Up/Down Stairs - Progress: Goal set today  PT Treatment Precautions/Restrictions  Precautions Precautions: Fall Required Braces or Orthoses: Other Brace/Splint Other Brace/Splint: bilateral AFO's but pt deferred wearing stating she wears for long distances only Restrictions Weight Bearing Restrictions: No Mobility (including Balance) Bed Mobility Supine to Sit: 6: Modified independent (Device/Increase time);HOB flat Sitting - Scoot to Edge of Bed: 6: Modified independent (Device/Increase time) Sit to Supine: 6: Modified independent (Device/Increase time);HOB flat Transfers Sit to Stand: 5: Supervision;From bed Stand to Sit: To bed;5:  Supervision Ambulation/Gait Ambulation/Gait Assistance: 5: Supervision Ambulation/Gait Assistance Details (indicate cue type and reason): Pt did not want to leave room and made laps in room. Pt with 2/4 dyspnea with ambulation and cueing throughout for breathing technique and to have a seated rest, pt denied resting until 3 laps complete. Ambulation Distance (Feet): 60 Feet Assistive device: Rolling walker Gait Pattern: Decreased stride length;Trunk flexed Stairs: No  Posture/Postural Control Posture/Postural Control: Postural limitations Postural Limitations: long term spina bifida and scoliosis Static Sitting Balance Static Sitting - Balance Support: No upper extremity supported;Feet unsupported Static Sitting - Level of Assistance: 6: Modified independent (Device/Increase time) Static Sitting - Comment/# of Minutes: 8 Exercise  General Exercises - Lower Extremity Long Arc Quad: AROM;Both;10 reps;Seated Hip Flexion/Marching: AROM;Both;5 reps;Seated End of Session PT - End of Session Equipment Utilized During Treatment: Gait belt Activity Tolerance: Patient limited by pain Patient left: in bed;with call bell in reach Nurse Communication: Mobility status for transfers General Behavior During Session: Other (comment) (emotional and tearful in regards to pain and others perception of her pain and need for meds) Cognition: [facility] for tasks performed  Delorse Lek 08/21/2011, 10:46 AM Delaney Meigs, PT [phone]

## 2011-08-21 NOTE — Progress Notes (Signed)
Name: Rhonda Ramos DOB: August 09, 1965 MRN: [id number] PCP: Rene Paci, MD, MD ADMIT DATE: 08/06/2011 LOS: 15  PCCM PROGRESS NOTE  Brief Patient Profile:  46 y/o F with h/o chronic pain syndrome related to spina bifida & scoliosis admitted on 08/06/11 to [facility] for increasing SOB. In Feb 2013, she underwent VATS for stapling of blebs with mech pleurodesis by Dr. Tyrone Sage. Patient was found to have right spontaneous pneumothorax and a right sided chest tube was placed by Dr. Cornelius Moras on 4/3. Patient had urinary tract infection diagnosed on 4/5 and was started on Cipro. Patient started having fever and chills starting 4/7 as high as 101 F on 4/7. A CXR was obtained which showed bibasilar atelactasis on 4/7. Chest tube was taken out on 4/7. Pt then developed ARDS and PNA and intubated 4/8.  Best Practice: DVT: Lovenox SUP: Protonix changed top pepcid 4/17 due to diarrhea Nutrition: PO Glycemic control: None  Lines / Drains: Right IJ 4/8>>>  ETT 4/8>>> 08/15/11 Aline >  08/14/2011  Micro: BCx2 4/8>>> neg Urine 4/9>> neg  Pleural fluid 4/13>>> neg   Antibiotics: Vanc 4/8>>>4/14 Zosyn 4/8>>>4/14 Levaquin 4/8>>>4/8   Tests / Events: Chest CT 4/3>>new pneumothrax  4/7 CT out CXR 4/8>>right lower lobe opacity 08/12/11 > start 48h nimbex for severe ARDS 08/14/11 >>CT chest 08/14/2011 >> RLL consolidation with loculated small effusion (does not warrant Chest tube per CVTS), Some ARDS bilateral LL, and very small left ptx (new) 4/18 - on floor, tolerating diet.  Consults: CTCS, TRH  Subjective/Overnight/Interval History: Continues to complain of pain in her back and L hip (she indicates are chronic) and incisional pain from chest tube sites.  Wants to try a regular diet.    Vital Signs: BP 108/64  Pulse 103  Temp(Src) 98 F (36.7 C) (Oral)  Resp 16  Ht 4\' 8"  (1.422 m)  Wt 97 lb 10.6 oz (44.3 kg)  BMI 21.90 kg/m2  SpO2 93% On RA.   Intake/Output Summary (Last 24 hours) at 08/21/11  0913 Last data filed at 08/21/11 0500  Gross per 24 hour  Intake    483 ml  Output    525 ml  Net    -42 ml     Physical Examination:  General: no acute distress Neuro:  AAOx4, speech clear, MAE HEENT: mm dry, no jvd Cardiovascular: s1s2 mild tachy Lungs:  resps even non labored on RA, diminished bases, few scattered ronchi Abdomen:  Soft, NT/ND, normoactive BS    Lab 08/21/11 0540 08/19/11 0554 08/18/11 0500  NA 137 136 134*  K 3.8 4.6 3.7  CL 99 97 95*  CO2 23 24 29   BUN 31* 35* 37*  CREATININE 1.25* 1.09 1.23*  GLUCOSE 94 87 97    Lab 08/21/11 0540 08/19/11 0554 08/18/11 0500  HGB 11.8* 11.4* 12.2  HCT 35.2* 34.8* 36.8  WBC 17.0* 16.3* 16.7*  PLT 727* 714* 703*       CXR - Dg Chest 2 View  08/20/2011  *RADIOLOGY REPORT*  Clinical Data: Dyspnea  CHEST - 2 VIEW  Comparison: 08/17/2011  Findings: Removal of the right IJ central line.  Some interval improvement in bibasilar airspace disease/atelectasis.  Small right effusion remains.  No pneumothorax evident. Marked scoliosis of the spine.  IMPRESSION: Improving bibasilar airspace disease/atelectasis.  Persistent small right effusion.  Original Report Authenticated By: Judie Petit. Ruel Favors, M.D.    ASSESSMENT AND PLAN  Acute hypoxemic resp failure/ARDS d/t HCAP v UTI c/b hx recurrent ptx. Dr. Delford Field attempted  R effusion tap on 4/13 without much return, likely due to loculation.  CXR improving 4/17.  Tol RA.  PLAN -  O2 as needed  pulm hygiene Int f/u CXR Off abx Lasix d/c 4/16  Recurrent ptx --Resolved. Seen by CVTS.  PLAN -  Int f/u cxr    INFECTIOUS DISEASE --  ?HCAP v UTI.  All cultures neg to date including pleural fluid.  Off abx.  However WBC trending up 4/17.  PLAN -  Monitor wbc, fever curve off abx   Mild acute renal insufficiency -- likely in setting diuresis.   Lab 08/21/11 0540 08/19/11 0554 08/18/11 0500 08/17/11 0443 08/16/11 0500  CREATININE 1.25* 1.09 1.23* 1.21* 1.05  Resolved  Anemia of  critical illness -- no s/s bleeding.  PLAN -  PRBC for hgb < 7gm% unless actively bleeding or in MI   Protein calorie malnutrition --  PLAN -  tol po diet -->advance to regular 4/18    Deconditioning -- likely chronic to some degree.  ?? Whether she is safe to go home.   PLAN -  PT recommending inpt rehab - Will consult Cont pt/ot   Diarrhea new c/o 4/17 clear liquids 4/17-->advance per patient request 4/18   Disposition -- PT previously rec inpt rehab  Rehab has declined admit.  Recommends home health PT, RN, OT Likely d/c 4/19 ?? If diarrhea resolved and pain improved.    Canary Brim, NP-C [place] [facility] Pgr: [phone]  Pt independently  seen and examined and available cxr's reviewed and I agree with above findings/ imp/ plan   Sandrea Hughs, MD Pulmonary and Critical Care Medicine [facility] Cell [phone]

## 2011-08-21 NOTE — Progress Notes (Signed)
Rehab admissions - Evaluated for possible admission.  Please see rehab consult done by Dr. Riley Kill yesterday recommending home with [facility] follow-up once medically ready.  I will sign off for inpatient rehab at this time.  #[phone]

## 2011-08-22 ENCOUNTER — Inpatient Hospital Stay (HOSPITAL_COMMUNITY): Payer: Medicare Other

## 2011-08-22 LAB — CBC
MCV: 90.4 fL (ref 78.0–100.0)
Platelets: 864 10*3/uL — ABNORMAL HIGH (ref 150–400)
RBC: 4.25 MIL/uL (ref 3.87–5.11)
WBC: 14.8 10*3/uL — ABNORMAL HIGH (ref 4.0–10.5)

## 2011-08-22 LAB — BASIC METABOLIC PANEL
CO2: 24 mEq/L (ref 19–32)
Calcium: 10.8 mg/dL — ABNORMAL HIGH (ref 8.4–10.5)
Chloride: 100 mEq/L (ref 96–112)
Potassium: 4.3 mEq/L (ref 3.5–5.1)
Sodium: 138 mEq/L (ref 135–145)

## 2011-08-22 MED ORDER — OXYBUTYNIN CHLORIDE ER 10 MG PO TB24
10.0000 mg | ORAL_TABLET | Freq: Every day | ORAL | Status: DC
  Administered 2011-08-22: 10 mg via ORAL
  Filled 2011-08-22: qty 1

## 2011-08-22 MED ORDER — OXYCODONE-ACETAMINOPHEN 5-325 MG PO TABS
1.0000 | ORAL_TABLET | Freq: Once | ORAL | Status: AC
  Administered 2011-08-22: 1 via ORAL
  Filled 2011-08-22: qty 1

## 2011-08-22 MED ORDER — FENTANYL 25 MCG/HR TD PT72: 1.0000 | MEDICATED_PATCH | TRANSDERMAL | Status: DC

## 2011-08-22 MED ORDER — FENTANYL 12 MCG/HR TD PT72: 1.0000 | MEDICATED_PATCH | TRANSDERMAL | Status: DC

## 2011-08-22 MED ORDER — LOPERAMIDE HCL 2 MG PO CAPS: 2.0000 mg | ORAL_CAPSULE | ORAL | Status: AC | PRN

## 2011-08-22 NOTE — Progress Notes (Signed)
   CARE MANAGEMENT NOTE 08/22/2011  Patient:  Rhonda Ramos, Rhonda Ramos   Account Number:  [id number]  Date Initiated:  08/11/2011  Documentation initiated by:  Reyaan Thoma  Subjective/Objective Assessment:   PT ADM WITH SPONTANEOUS PNEUMOTHORAX REQUIRING CHEST TUBE PLACEMENT.     Action/Plan:   PTA, PT LIVES ALONE, BUT HAS SUPPORTIVE FAMILY; WAS DISCHARGED TO MOTHER'S HOME AFTER LAST HOSPITALIZATION, AND HAD HOME O2 SET UP WITH [facility].   Anticipated DC Date:  08/15/2011   Anticipated DC Plan:  HOME W HOME HEALTH SERVICES      DC Planning Services  CM consult      [facility] Choice  HOME HEALTH   Choice offered to / List presented to:  C-1 Patient        HH arranged  HH-1 RN  HH-2 PT  HH-3 OT      [facility] agency  [facility]   Status of service:  Completed, signed off Medicare Important Message given?   (If response is "NO", the following Medicare IM given date fields will be blank) Date Medicare IM given:   Date Additional Medicare IM given:    Discharge Disposition:  HOME W HOME HEALTH SERVICES  Per UR Regulation:    If discussed at Long Length of Stay Meetings, dates discussed:    Comments:  08/22/11 Elle Vezina,RN,BSN [id number] DISCHARGE ADDRESS(MOM AND DAD'S HOME) : [address] [place], [place] [address]; MOM'S HOME # IS [phone]; PT' S CELL # IS [phone]. PT FOR DC HOME TODAY WITH PARENTS.  SHE HAS BEEN WEANED FROM O2.  NEEDS HOME HEALTH FOLLOW UP.  REFERRAL TO [facility] FOR HOME HEALTH FOLLOW UP, PER PT CHOICE.  START OF CARE 24-48H POST DC DATE.

## 2011-08-22 NOTE — Discharge Summary (Signed)
Physician Discharge Summary  Patient ID: Rhonda Ramos MRN: [id number] DOB/AGE: 1966-02-18 46 y.o.  Admit date: 08/06/2011 Discharge date: 08/22/2011    Discharge Diagnoses:   1. Recurrent spontaneous pneumothorax 2. Acute Respiratory with hypoxia 3. Severe Sepsis secondary to PNA & E-Coli UTI 4. Seizure Disorder 5. Spina Bifida / Scoliosis 6. GERD 7. Chronic Pain Syndrome 8. Diarrhea in setting of colace  9. History of narcotic abuse with detox in fall 2011 10. Anemia of critical illness    Brief Summary: Rhonda Ramos is a 46 y.o. y/o female with a PMH of chronic pain syndrome related to spina bifida & scoliosis admitted to [facility] on 08/06/11 by CVTS for increasing SOB. In Feb 2013, she underwent VATS for stapling of blebs with mech pleurodesis by Dr. Tyrone Sage. On admit she was found to have right spontaneous pneumothorax and a right sided chest tube was placed by Dr. Cornelius Moras on 4/3. Patient had urinary tract infection diagnosed on 4/5 and was started on Cipro. Patient started having fever and chills starting 4/7 as high as 101 F on 4/7. A CXR was obtained which showed bibasilar atelactasis on 4/7. Chest tube was taken out on 4/7. She subsequently decompensated in the setting of PNA developing ARDS.  She required intubation on 4/8 with placement of central venous access.  While intubated, she required versed and fentanyl gtts to achieve ventilator synchrony and reduction of agitation.  She was treated with antibiotics as below.  Pan cultured with noted positive urine culture for e-coli and MRSA PCR positive.  ICU course complicated by severe acidosis.  She required paralytics and deep sedation. Nutritionally, she was supported with tube feeding.   Repeat CT of the chest on 4/11 demonstrated worsened RLL consolidation and effusion.  Ultrasound guided thoracentesis was performed with 90ml of blood tinged serous fluid returned.  She made slow progress and was ultimately extubated on 4/13.   She continued to make a slow recovery.  Physical Therapy, Occupational Therapy, Nutrition were utilized during hospitalization.  WBC trend, fevers improved.  At time of discharge, she was able to ambulate with PT / walker with braces.  She will be discharged home with her Mother for safety.  Home health PT, OT and RN will be utilized.    She remained on low dose Fentanyl patches post ICU stay.  Initially on 12.8mcg patch but was increased to .  She recently had an Rx for oxycodone IR from PCP.  Discussed this with patient at time of d/c and she indicates she "lost them..I don't have them".  She was encouraged to find the pills and return them to PCP or patches to ensure that she does not have potential for overdose.  She was very defensive regarding use of pain medications.    Best Practice:  DVT: Lovenox  SUP: Protonix changed top pepcid 4/17 due to diarrhea  Nutrition: PO  Glycemic control: None   Lines / Drains:  Right IJ 4/8>>>4/13 ETT 4/8>>>4/12  Aline  4/11>>4/12  Micro:  4/3 UC>>>E-Coli pan sensitive BCx2 4/8>>> neg  Urine 4/9>> neg  Pleural fluid 4/13>>> neg   Antibiotics:  Vanc 4/8>>>4/14  Zosyn 4/8>>>4/14  Levaquin 4/8>>>4/8   Tests / Events:  4/3>>new pneumothorax, CT placed 4/7>>>CT out  4/8 CXR>>right lower lobe opacity, decompensated requiring intubation 4/9>> start 48h nimbex for severe ARDS  4/11CT chest >>RLL consolidation with loculated small effusion (does not warrant Chest tube per CVTS), Some ARDS bilateral LL, and very small left ptx (  new)  4/17>>diarrhea secondary to colace use, diarrhea stopped when colace stopped 4/18>>on floor, tolerating diet.  Discharge Exam: General: no acute distress  Neuro: AAOx4, speech clear, MAE  HEENT: mm dry, no jvd  Cardiovascular: s1s2 mild tachy  Lungs: resps even non labored on RA, diminished bases, few scattered ronchi  Abdomen: Soft, NT/ND, normoactive BS   Discharge Labs  BMET  Lab 08/22/11 0510 08/21/11  0540 08/19/11 0554 08/18/11 0500 08/17/11 0443  NA 138 137 136 134* 137  K 4.3 3.8 -- -- --  CL 100 99 97 95* 91*  CO2 24 23 24 29  34*  GLUCOSE 90 94 87 97 107*  BUN 27* 31* 35* 37* 26*  CREATININE 1.28* 1.25* 1.09 1.23* 1.21*  CALCIUM 10.8* 10.2 9.7 10.3 10.5  MG -- -- -- -- --  PHOS -- -- -- -- --     CBC  Lab 08/22/11 0510 08/21/11 0540 08/19/11 0554  HGB 12.8 11.8* 11.4*  HCT 38.4 35.2* 34.8*  WBC 14.8* 17.0* 16.3*  PLT 864* 727* 714*     Discharge Orders    Future Appointments: Provider: Department: Dept Phone: Center:   08/28/2011 10:45 AM Newt Lukes, MD [facility] [phone] [facility]     Future Orders Please Complete By Expires   Diet - low sodium heart healthy      Increase activity slowly      Call MD for:  temperature >100.4      Call MD for:  difficulty breathing, headache or visual disturbances      Discharge instructions      Comments:   [facility] [phone]  Home Health will follow you for home PT, RN, OT  Discharge plan is to mother's home for patient safety.       Follow-up Information    Follow up with Rene Paci, MD on 08/28/2011. (Appt at 1045 am)    Contact information:   [address] [facility] [address] [place] [address] [phone]          DISCHARGE MEDICATIONS   Rhonda Ramos, Rhonda Ramos  Home Medication Instructions [facility]:[id number]   Printed on:08/22/11 2213  Medication Information                    norethindrone (CAMILA) 0.35 MG tablet Take 1 tablet (0.35 mg total) by mouth daily.           levETIRAcetam (KEPPRA) 250 MG tablet Take 250 mg by mouth daily.            Multiple Vitamins-Minerals (MULTIVITAMINS THER. W/MINERALS) TABS Take 1 tablet by mouth daily.           amLODipine (NORVASC) 5 MG tablet take 1 tablet by mouth once daily           oxybutynin (DITROPAN-XL) 10 MG 24 hr tablet take 1 tablet by mouth once daily           clonazePAM (KLONOPIN) 0.5 MG tablet take 1 tablet by  mouth twice a day if needed for anxiety           FLUoxetine (PROZAC) 20 MG capsule take 3 capsules by mouth once daily           omeprazole (PRILOSEC) 20 MG capsule take 1 capsule by mouth once daily           loperamide (IMODIUM) 2 MG capsule Take 1 capsule (2 mg total) by mouth as needed for diarrhea or loose stools.  fentaNYL (DURAGESIC) 25 MCG/HR Place 1 patch (25 mcg total) onto the skin every 3 (three) days.              Disposition: 01-Home or Self Care - Plan is for patient to discharge to mothers home for post hospital care and patient safety.   Discharged Condition: Rhonda Ramos has met maximum benefit of inpatient care and is medically stable and cleared for discharge.  Patient is pending follow up as above.   Discussed any changes in patients breathing or sudden chest pain for her to return to ER or PCP for follow up chest xray.  Patient indicates verbal understanding.     Time spent on disposition:  Greater than 35 minutes.   Signed: Canary Brim, NP-C [place] [facility] Pgr: [phone]  Pt independently  seen and examined and available cxr's/ labs reviewed and I agree with above findings/ imp/ plan   Sandrea Hughs, MD Pulmonary and Critical Care Medicine [facility] Cell [phone]

## 2011-08-28 ENCOUNTER — Ambulatory Visit (INDEPENDENT_AMBULATORY_CARE_PROVIDER_SITE_OTHER): Payer: Medicare Other | Admitting: Internal Medicine

## 2011-08-28 ENCOUNTER — Encounter: Payer: Self-pay | Admitting: Internal Medicine

## 2011-08-28 VITALS — BP 132/72 | HR 105 | Temp 98.6°F | Ht <= 58 in | Wt 102.8 lb

## 2011-08-28 DIAGNOSIS — J9383 Other pneumothorax: Secondary | ICD-10-CM

## 2011-08-28 DIAGNOSIS — J939 Pneumothorax, unspecified: Secondary | ICD-10-CM

## 2011-08-28 DIAGNOSIS — G894 Chronic pain syndrome: Secondary | ICD-10-CM

## 2011-08-28 MED ORDER — FENTANYL 50 MCG/HR TD PT72: 1.0000 | MEDICATED_PATCH | TRANSDERMAL | Status: DC

## 2011-08-28 NOTE — Assessment & Plan Note (Signed)
08/2011 hospitalization and CT for recurrent event 06/2011 s/p VATS and pleurodesis to treat same Residual suture in R chest wall  From CT-  I called up to pulm re: same, spoke with Dr Delton Coombes - he will remove this afternoon upstairs (pulm floor) Hospital course and complications reviewed with pt/mom today follow up CVTS as planned

## 2011-08-28 NOTE — Assessment & Plan Note (Signed)
Related to LBP and hips and scoli with many prior surgeries Now exacerbated by multiple recurrent pneumothorax and interventions/CT Will increase duragesic patch dose now - No relief with tramadol, also would need to use with caution given ?sz event tapering off narcotics (s/p rehab) fall 2011 pending referral to pain mgmt - done 07/2011 Also pt continues to work to keep appointment with spine and scoli specialists "for new opinion"

## 2011-08-28 NOTE — Progress Notes (Signed)
  Subjective:    Patient ID: Rhonda Ramos, female    DOB: 11-20-1965, 46 y.o.   MRN: [id number]  HPI Here for 08/2011 hospital follow up - recurrent R pneumothorax s/p CT aslo VDRF with pneumonia and sepsis - home for 6 days  complains of intense pain not controlled by duragesic 25 dose, ? Increase Also ?suture out R chest wall - tender and unable to lie on r side due to suture  Past Medical History  Diagnosis Date  . Chronic pain syndrome     s/p rehab - narc detox fall 2011  . SPINA BIFIDA   . Scoliosis     multiple surg for same  . DEPRESSION     > ptsd (spouse suicide/gsw 11/2005  . GERD   . HYPERTENSION   . Anxiety   . Pneumothorax on right 04/03/11, 06/10/11    recurrent s/p VATS 2/13  . Seizures     onset fall 2011 with narc detox  . Chronic back pain greater than 3 months duration   . 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 Urinary tract infection 08/08/2011    E. coli    Review of Systems  Constitutional: Positive for fatigue. Negative for fever.  Respiratory: Negative for cough and shortness of breath.   Cardiovascular: Positive for chest pain.  Musculoskeletal: Positive for back pain.       Objective:   Physical Exam BP 132/72  Pulse 105  Temp(Src) 98.6 F (37 C) (Oral)  Ht 4\' 8"  (1.422 m)  Wt 102 lb 12.8 oz (46.63 kg)  BMI 23.05 kg/m2  SpO2 97%  General:  small statured woman, older appearing than stated age. Mom at side Neck:  thick, short - Lungs:  shallow respiratory effort, no intercostal retractions or use of accessory muscles; normal breath sounds bilaterally - no crackles and no wheezes.   Retained suture R chest wall prior CT site Heart:  Slightly tachy rate, but regular rhythm, no murmur, and no rub. BLE without edema. Msk:  marked scoli deformitiy with left curvature t-spine - multiple well healed scars -  Neurologic:  alert & oriented X3 and cranial nerves II-XII symetrically intact.  strength  grossly normal in all extremities, sensation intact to light touch, and gait assisted with cane. speech fluent without dysarthria or aphasia; follows commands with good comprehension.  Skin:  No cellulitis at suture site Psych:  tremoulus, mildly anxious -Oriented X3, memory intact for recent and remote, normally interactive, and good eye contac  Lab Results  Component Value Date   WBC 14.8* 08/22/2011   HGB 12.8 08/22/2011   HCT 38.4 08/22/2011   PLT 864* 08/22/2011   GLUCOSE 90 08/22/2011   CHOL 195 07/21/2011   TRIG 223.0* 07/21/2011   HDL 49.80 07/21/2011   LDLDIRECT 112.3 07/21/2011   ALT 11 08/11/2011   AST 21 08/11/2011   NA 138 08/22/2011   K 4.3 08/22/2011   CL 100 08/22/2011   CREATININE 1.28* 08/22/2011   BUN 27* 08/22/2011   CO2 24 08/22/2011   TSH 2.74 07/21/2011   INR 0.99 08/06/2011   HGBA1C 5.4 05/08/2008       Assessment & Plan:  See problem list. Medications and labs reviewed today. Time spent with pt/family today 30 minutes, greater than 50% time spent counseling patient on hospitalization, recurrent pneumothorax and medication review. Also review of hospital records and coordination of care with pulm ([place]) to remove suture today

## 2011-08-28 NOTE — Patient Instructions (Addendum)
It was good to see you today. Increase your Duragesic patch to dose - change every 3 days Your prescription(s) have been given to you to submit to your pharmacy. Please take as directed and contact our office if you believe you are having problem(s) with the medication(s). Go to 2nd floor - Dr Delton Coombes will remove your stitches today Please schedule followup in 2-4 weeks, call sooner if problems.

## 2011-09-03 DIAGNOSIS — R569 Unspecified convulsions: Secondary | ICD-10-CM

## 2011-09-03 DIAGNOSIS — I10 Essential (primary) hypertension: Secondary | ICD-10-CM

## 2011-09-03 DIAGNOSIS — M6281 Muscle weakness (generalized): Secondary | ICD-10-CM

## 2011-09-03 DIAGNOSIS — Q059 Spina bifida, unspecified: Secondary | ICD-10-CM

## 2011-09-03 DIAGNOSIS — R269 Unspecified abnormalities of gait and mobility: Secondary | ICD-10-CM

## 2011-09-08 ENCOUNTER — Emergency Department (HOSPITAL_COMMUNITY): Payer: Medicare Other

## 2011-09-08 ENCOUNTER — Encounter (HOSPITAL_COMMUNITY): Payer: Self-pay | Admitting: *Deleted

## 2011-09-08 ENCOUNTER — Inpatient Hospital Stay (HOSPITAL_COMMUNITY)
Admission: EM | Admit: 2011-09-08 | Discharge: 2011-09-21 | DRG: 163 | Disposition: A | Discharge status: Home / Self Care | Payer: Medicare Other | Source: Ambulatory Visit | Attending: Internal Medicine | Admitting: Internal Medicine

## 2011-09-08 DIAGNOSIS — Z8261 Family history of arthritis: Secondary | ICD-10-CM

## 2011-09-08 DIAGNOSIS — M4120 Other idiopathic scoliosis, site unspecified: Secondary | ICD-10-CM | POA: Diagnosis present

## 2011-09-08 DIAGNOSIS — J9601 Acute respiratory failure with hypoxia: Secondary | ICD-10-CM

## 2011-09-08 DIAGNOSIS — I10 Essential (primary) hypertension: Secondary | ICD-10-CM | POA: Diagnosis present

## 2011-09-08 DIAGNOSIS — F4310 Post-traumatic stress disorder, unspecified: Secondary | ICD-10-CM | POA: Diagnosis present

## 2011-09-08 DIAGNOSIS — Q059 Spina bifida, unspecified: Secondary | ICD-10-CM

## 2011-09-08 DIAGNOSIS — Z79899 Other long term (current) drug therapy: Secondary | ICD-10-CM

## 2011-09-08 DIAGNOSIS — J930 Spontaneous tension pneumothorax: Principal | ICD-10-CM

## 2011-09-08 DIAGNOSIS — J9383 Other pneumothorax: Secondary | ICD-10-CM

## 2011-09-08 DIAGNOSIS — R0902 Hypoxemia: Secondary | ICD-10-CM | POA: Diagnosis present

## 2011-09-08 DIAGNOSIS — K219 Gastro-esophageal reflux disease without esophagitis: Secondary | ICD-10-CM | POA: Diagnosis present

## 2011-09-08 DIAGNOSIS — R32 Unspecified urinary incontinence: Secondary | ICD-10-CM | POA: Diagnosis present

## 2011-09-08 DIAGNOSIS — Z91040 Latex allergy status: Secondary | ICD-10-CM

## 2011-09-08 DIAGNOSIS — G894 Chronic pain syndrome: Secondary | ICD-10-CM

## 2011-09-08 DIAGNOSIS — Z87891 Personal history of nicotine dependence: Secondary | ICD-10-CM

## 2011-09-08 DIAGNOSIS — F341 Dysthymic disorder: Secondary | ICD-10-CM | POA: Diagnosis present

## 2011-09-08 DIAGNOSIS — J9600 Acute respiratory failure, unspecified whether with hypoxia or hypercapnia: Secondary | ICD-10-CM | POA: Diagnosis present

## 2011-09-08 DIAGNOSIS — F3289 Other specified depressive episodes: Secondary | ICD-10-CM

## 2011-09-08 DIAGNOSIS — J189 Pneumonia, unspecified organism: Secondary | ICD-10-CM

## 2011-09-08 DIAGNOSIS — Z881 Allergy status to other antibiotic agents status: Secondary | ICD-10-CM

## 2011-09-08 DIAGNOSIS — Z888 Allergy status to other drugs, medicaments and biological substances status: Secondary | ICD-10-CM

## 2011-09-08 DIAGNOSIS — Z833 Family history of diabetes mellitus: Secondary | ICD-10-CM

## 2011-09-08 DIAGNOSIS — R87619 Unspecified abnormal cytological findings in specimens from cervix uteri: Secondary | ICD-10-CM

## 2011-09-08 DIAGNOSIS — J939 Pneumothorax, unspecified: Secondary | ICD-10-CM | POA: Diagnosis present

## 2011-09-08 DIAGNOSIS — I498 Other specified cardiac arrhythmias: Secondary | ICD-10-CM | POA: Diagnosis present

## 2011-09-08 DIAGNOSIS — F329 Major depressive disorder, single episode, unspecified: Secondary | ICD-10-CM

## 2011-09-08 HISTORY — DX: Spontaneous tension pneumothorax: J93.0

## 2011-09-08 LAB — DIFFERENTIAL
Basophils Relative: 1 % (ref 0–1)
Lymphocytes Relative: 52 % — ABNORMAL HIGH (ref 12–46)
Monocytes Absolute: 0.7 10*3/uL (ref 0.1–1.0)
Monocytes Relative: 7 % (ref 3–12)
Neutro Abs: 3.6 10*3/uL (ref 1.7–7.7)

## 2011-09-08 LAB — COMPREHENSIVE METABOLIC PANEL
BUN: 11 mg/dL (ref 6–23)
CO2: 20 mEq/L (ref 19–32)
Chloride: 100 mEq/L (ref 96–112)
Creatinine, Ser: 0.75 mg/dL (ref 0.50–1.10)
GFR calc Af Amer: >90 mL/min (ref >90)
GFR calc non Af Amer: >90 mL/min (ref >90)
Glucose, Bld: 134 mg/dL — ABNORMAL HIGH (ref 70–99)
Total Bilirubin: 0.3 mg/dL (ref 0.3–1.2)

## 2011-09-08 LAB — CBC
HCT: 41.5 % (ref 36.0–46.0)
Hemoglobin: 14.7 g/dL (ref 12.0–15.0)
MCHC: 35.4 g/dL (ref 30.0–36.0)

## 2011-09-08 LAB — TROPONIN I: Troponin I: <0.3 ng/mL (ref <0.30)

## 2011-09-08 LAB — PRO B NATRIURETIC PEPTIDE: Pro B Natriuretic peptide (BNP): 120.9 pg/mL (ref 0–125)

## 2011-09-08 MED ORDER — PROPOFOL 10 MG/ML IV EMUL
5.0000 ug/kg/min | INTRAVENOUS | Status: DC
  Filled 2011-09-08: qty 100

## 2011-09-08 MED ORDER — LORAZEPAM 2 MG/ML IJ SOLN
INTRAMUSCULAR | Status: AC
  Administered 2011-09-08: 0.5 mg via INTRAVENOUS
  Filled 2011-09-08: qty 1

## 2011-09-08 MED ORDER — PROPOFOL 10 MG/ML IV EMUL
INTRAVENOUS | Status: AC | PRN
  Administered 2011-09-08: 4 mL via INTRAVENOUS

## 2011-09-08 MED ORDER — LORAZEPAM 2 MG/ML IJ SOLN
1.0000 mg | Freq: Once | INTRAMUSCULAR | Status: AC
  Administered 2011-09-08: 1 mg via INTRAVENOUS
  Filled 2011-09-08: qty 1

## 2011-09-08 MED ORDER — PROPOFOL 10 MG/ML IV BOLUS: 0.5000 mg/kg | Freq: Once | INTRAVENOUS | Status: DC

## 2011-09-08 MED ORDER — LORAZEPAM 2 MG/ML IJ SOLN
2.0000 mg | Freq: Once | INTRAMUSCULAR | Status: AC
  Administered 2011-09-08: 2 mg via INTRAVENOUS

## 2011-09-08 NOTE — ED Notes (Signed)
Dr Hyman Hopes and Dr Vear Clock at bedside to prepare for CT.  Lidocaine injected to left upper chest and 18 gauge needle inserted

## 2011-09-08 NOTE — Op Note (Signed)
CARDIOTHORACIC SURGERY OPERATIVE NOTE  Date of Procedure:  09/08/2011  Preoperative Diagnosis: Left Spontaneous Pneumothorax  Postoperative Diagnosis: Same  Procedure:   Left chest tube placement  Surgeon:   Salvatore Decent. Cornelius Moras, MD  Anesthesia:   1% lidocaine local with intravenous sedation    DETAILS OF THE OPERATIVE PROCEDURE  Following full informed consent the patient was given sedation intravenously under the care and direction of Dr Hyman Hopes and continuously monitored for rhythm, BP and oxygen saturation. The left chest was prepared and draped in a sterile manner. 1% lidocaine was utilized to anesthetize the skin and subcutaneous tissues. A small incision was made and a 28 French straight chest tube was placed through the incision into the pleural space. The tube was secured to the skin and connected to a closed suction collection device. The patient tolerated the procedure well. A portable CXR was ordered. There were no complications.    Salvatore Decent. Cornelius Moras, MD 09/08/2011 11:52 PM

## 2011-09-08 NOTE — ED Notes (Signed)
Dr.Webb with ekg at bedside.
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
## 2011-09-08 NOTE — ED Notes (Signed)
Patient started with resp distress approx 1 hr ago.  Was in the hospital for a collasped left lung for about 3 weeks ago. Arrived via EMS very anxious and having difficulty breathing.

## 2011-09-09 ENCOUNTER — Emergency Department (HOSPITAL_COMMUNITY): Payer: Medicare Other

## 2011-09-09 ENCOUNTER — Encounter (HOSPITAL_COMMUNITY): Payer: Self-pay | Admitting: Emergency Medicine

## 2011-09-09 DIAGNOSIS — J189 Pneumonia, unspecified organism: Secondary | ICD-10-CM

## 2011-09-09 DIAGNOSIS — G894 Chronic pain syndrome: Secondary | ICD-10-CM

## 2011-09-09 DIAGNOSIS — J930 Spontaneous tension pneumothorax: Secondary | ICD-10-CM

## 2011-09-09 DIAGNOSIS — R87619 Unspecified abnormal cytological findings in specimens from cervix uteri: Secondary | ICD-10-CM

## 2011-09-09 DIAGNOSIS — J9600 Acute respiratory failure, unspecified whether with hypoxia or hypercapnia: Secondary | ICD-10-CM

## 2011-09-09 LAB — URINALYSIS, ROUTINE W REFLEX MICROSCOPIC
Glucose, UA: NEGATIVE mg/dL
Ketones, ur: NEGATIVE mg/dL
Protein, ur: 30 mg/dL — AB
pH: 6.5 (ref 5.0–8.0)

## 2011-09-09 LAB — POCT I-STAT 3, ART BLOOD GAS (G3+)
Bicarbonate: 21.5 mEq/L (ref 20.0–24.0)
pCO2 arterial: 41.1 mmHg (ref 35.0–45.0)
pH, Arterial: 7.326 — ABNORMAL LOW (ref 7.350–7.400)

## 2011-09-09 LAB — URINE MICROSCOPIC-ADD ON

## 2011-09-09 LAB — CBC
HCT: 34.7 % — ABNORMAL LOW (ref 36.0–46.0)
MCH: 29.4 pg (ref 26.0–34.0)
MCHC: 33.4 g/dL (ref 30.0–36.0)
RDW: 14.5 % (ref 11.5–15.5)

## 2011-09-09 LAB — BASIC METABOLIC PANEL
BUN: 10 mg/dL (ref 6–23)
Calcium: 9 mg/dL (ref 8.4–10.5)
Creatinine, Ser: 0.7 mg/dL (ref 0.50–1.10)
GFR calc Af Amer: >90 mL/min (ref >90)
GFR calc non Af Amer: >90 mL/min (ref >90)
Potassium: 3.2 mEq/L — ABNORMAL LOW (ref 3.5–5.1)

## 2011-09-09 LAB — GLUCOSE, CAPILLARY: Glucose-Capillary: 134 mg/dL — ABNORMAL HIGH (ref 70–99)

## 2011-09-09 MED ORDER — MORPHINE SULFATE 2 MG/ML IJ SOLN
2.0000 mg | INTRAMUSCULAR | Status: DC | PRN
  Administered 2011-09-09 (×2): 2 mg via INTRAVENOUS
  Filled 2011-09-09 (×2): qty 1

## 2011-09-09 MED ORDER — VANCOMYCIN HCL IN DEXTROSE 1-5 GM/200ML-% IV SOLN
1000.0000 mg | Freq: Once | INTRAVENOUS | Status: AC
  Administered 2011-09-09: 1000 mg via INTRAVENOUS
  Filled 2011-09-09: qty 200

## 2011-09-09 MED ORDER — CLONAZEPAM 0.5 MG PO TABS
0.5000 mg | ORAL_TABLET | Freq: Two times a day (BID) | ORAL | Status: DC | PRN
  Administered 2011-09-09 – 2011-09-16 (×14): 0.5 mg via ORAL
  Filled 2011-09-09 (×16): qty 1

## 2011-09-09 MED ORDER — FLUOXETINE HCL 20 MG PO TABS
60.0000 mg | ORAL_TABLET | Freq: Every day | ORAL | Status: DC
  Administered 2011-09-10 – 2011-09-21 (×11): 60 mg via ORAL
  Filled 2011-09-09 (×14): qty 3

## 2011-09-09 MED ORDER — MIDAZOLAM HCL 2 MG/2ML IJ SOLN
1.0000 mg | INTRAMUSCULAR | Status: DC | PRN
  Administered 2011-09-09 – 2011-09-11 (×6): 1 mg via INTRAVENOUS
  Filled 2011-09-09 (×5): qty 2

## 2011-09-09 MED ORDER — HEPARIN SODIUM (PORCINE) 5000 UNIT/ML IJ SOLN
5000.0000 [IU] | Freq: Three times a day (TID) | INTRAMUSCULAR | Status: AC
  Administered 2011-09-09 – 2011-09-15 (×20): 5000 [IU] via SUBCUTANEOUS
  Filled 2011-09-09 (×23): qty 1

## 2011-09-09 MED ORDER — POTASSIUM CHLORIDE CRYS ER 20 MEQ PO TBCR
40.0000 meq | EXTENDED_RELEASE_TABLET | ORAL | Status: AC
  Administered 2011-09-09 (×2): 40 meq via ORAL

## 2011-09-09 MED ORDER — ADULT MULTIVITAMIN W/MINERALS CH
1.0000 | ORAL_TABLET | Freq: Every day | ORAL | Status: DC
  Administered 2011-09-09 – 2011-09-21 (×12): 1 via ORAL
  Filled 2011-09-09 (×14): qty 1

## 2011-09-09 MED ORDER — OXYBUTYNIN CHLORIDE ER 10 MG PO TB24
10.0000 mg | ORAL_TABLET | Freq: Every day | ORAL | Status: DC
  Administered 2011-09-09 – 2011-09-21 (×12): 10 mg via ORAL
  Filled 2011-09-09 (×15): qty 1

## 2011-09-09 MED ORDER — OXYCODONE HCL 5 MG PO TABS: 10.0000 mg | ORAL_TABLET | ORAL | Status: DC | PRN

## 2011-09-09 MED ORDER — PIPERACILLIN-TAZOBACTAM 3.375 G IVPB 30 MIN
3.3750 g | Freq: Once | INTRAVENOUS | Status: AC
  Administered 2011-09-09: 3.375 g via INTRAVENOUS
  Filled 2011-09-09: qty 50

## 2011-09-09 MED ORDER — POTASSIUM CHLORIDE CRYS ER 20 MEQ PO TBCR: 40.0000 meq | EXTENDED_RELEASE_TABLET | Freq: Once | ORAL | Status: DC

## 2011-09-09 MED ORDER — PIPERACILLIN-TAZOBACTAM 3.375 G IVPB 30 MIN: 3.3750 g | Freq: Three times a day (TID) | INTRAVENOUS | Status: DC

## 2011-09-09 MED ORDER — POTASSIUM CHLORIDE CRYS ER 20 MEQ PO TBCR
EXTENDED_RELEASE_TABLET | ORAL | Status: AC
  Administered 2011-09-09: 40 meq via ORAL
  Filled 2011-09-09: qty 4

## 2011-09-09 MED ORDER — FENTANYL CITRATE 0.05 MG/ML IJ SOLN
25.0000 ug | INTRAMUSCULAR | Status: DC | PRN
  Administered 2011-09-09 – 2011-09-11 (×11): 25 ug via INTRAVENOUS
  Filled 2011-09-09 (×8): qty 2

## 2011-09-09 MED ORDER — FLUOXETINE HCL 20 MG PO CAPS
60.0000 mg | ORAL_CAPSULE | Freq: Every day | ORAL | Status: DC
  Administered 2011-09-09: 60 mg via ORAL
  Filled 2011-09-09: qty 3

## 2011-09-09 MED ORDER — SODIUM CHLORIDE 0.9 % IV SOLN
250.0000 mL | INTRAVENOUS | Status: DC | PRN
  Administered 2011-09-09: 20 mL/h via INTRAVENOUS

## 2011-09-09 MED ORDER — VANCOMYCIN HCL 500 MG IV SOLR
500.0000 mg | Freq: Two times a day (BID) | INTRAVENOUS | Status: DC
  Administered 2011-09-09 – 2011-09-10 (×4): 500 mg via INTRAVENOUS
  Filled 2011-09-09 (×5): qty 500

## 2011-09-09 MED ORDER — PIPERACILLIN-TAZOBACTAM 3.375 G IVPB
3.3750 g | Freq: Three times a day (TID) | INTRAVENOUS | Status: DC
  Administered 2011-09-09 – 2011-09-11 (×7): 3.375 g via INTRAVENOUS
  Filled 2011-09-09 (×8): qty 50

## 2011-09-09 MED ORDER — PANTOPRAZOLE SODIUM 40 MG PO TBEC
40.0000 mg | DELAYED_RELEASE_TABLET | Freq: Every day | ORAL | Status: DC
  Administered 2011-09-09 – 2011-09-10 (×2): 40 mg via ORAL
  Filled 2011-09-09 (×2): qty 1

## 2011-09-09 MED ORDER — FENTANYL 50 MCG/HR TD PT72
50.0000 ug | MEDICATED_PATCH | TRANSDERMAL | Status: DC
  Administered 2011-09-09: 50 ug via TRANSDERMAL
  Filled 2011-09-09: qty 1

## 2011-09-09 MED ORDER — LEVETIRACETAM 250 MG PO TABS
250.0000 mg | ORAL_TABLET | Freq: Every day | ORAL | Status: DC
  Administered 2011-09-09 – 2011-09-21 (×13): 250 mg via ORAL
  Filled 2011-09-09 (×14): qty 1

## 2011-09-09 NOTE — ED Notes (Signed)
Lab at [facility] drawing [facility] x2, preparing to transport to 2100.
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
## 2011-09-09 NOTE — ED Provider Notes (Signed)
History     CSN: [id number]  Arrival date & time 09/08/11  2245   First MD Initiated Contact with Patient 09/08/11 2251      Chief Complaint  Patient presents with  . Respiratory Distress   Level 5 caveat  (Consider location/radiation/quality/duration/timing/severity/associated sxs/prior treatment) HPI  H/o recurrent spontaneous pneumothorax pw shortness of breath. Sudden onset, just prior to arrival. Patient unable to clarify complete history 2/2 shortness of breath. Per EMS HR 140s, decreased breath sounds on left. Recent complicated hospital course with ptx, pna.  Past Medical History  Diagnosis Date  . Chronic pain syndrome     s/p rehab - narc detox fall 2011  . SPINA BIFIDA   . Scoliosis     multiple surg for same  . DEPRESSION     > ptsd (spouse suicide/gsw 11/2005  . GERD   . HYPERTENSION   . Anxiety   . Pneumothorax on right 04/03/11, 06/10/11    recurrent s/p VATS 2/13  . Seizures     onset fall 2011 with narc detox  . Chronic back pain greater than 3 months duration   . 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 Urinary tract infection 08/08/2011    E. coli    Past Surgical History  Procedure Date  . Lumbar laminectomy for tethered cord release     x's 4 (84,94,98, and 02)  . 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 Scoliosis 1982    neck fused w/bones  . Chest tube placement 04/03/11    S/P large right sided pneumo  . Back surgery   . Video assisted thoracoscopy 06/11/2011    Procedure: VIDEO ASSISTED THORACOSCOPY;  Surgeon: Delight Ovens, MD;  Location: [facility] OR;  Service: Thoracic;  Laterality: Right;  Stapling of Large Apical and Inferiors blebs; Mechanical Pleurodesis   . Resection of apical bleb 06/11/2011    Procedure: RESECTION OF APICAL BLEB;  Surgeon: Delight Ovens, MD;  Location: [facility] OR;  Service: Thoracic;  Laterality: Right;  and inferior bleb    Family History  Problem Relation Age of Onset   . Diabetes Father   . Arthritis Other     parent & grandparent  . Diabetes Other     grandparent    History  Substance Use Topics  . Smoking status: Former Smoker -- 3.0 packs/day for 27 years    Types: Cigarettes  . Smokeless tobacco: Former Neurosurgeon    Quit date: 02/07/2010   Comment: Widowed, lives alone  . 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   except as noted HPI    Allergies  Latex; Codeine; and Erythromycin  Home Medications   Current Outpatient Rx  Name Route Sig Dispense Refill  . AMLODIPINE BESYLATE 5 MG PO TABS Oral Take 5 mg by mouth daily.    Marland Kitchen CLONAZEPAM 0.5 MG PO TABS Oral Take 0.5 mg by mouth 2 (two) times daily as needed. For anxiety.    . FENTANYL 50 MCG/HR TD PT72 Transdermal Place 1 patch onto the skin every 3 (three) days.    Marland Kitchen FLUOXETINE HCL 20 MG PO TABS  Oral Take 60 mg by mouth daily.    Marland Kitchen LEVETIRACETAM 250 MG PO TABS Oral Take 250 mg by mouth daily.    . ADULT MULTIVITAMIN W/MINERALS CH Oral Take 1 tablet by mouth daily.    . NORETHINDRONE 0.35 MG PO TABS Oral Take 1 tablet by mouth daily.    Marland Kitchen OMEPRAZOLE 20 MG PO CPDR Oral Take 20 mg by mouth daily.    . OXYBUTYNIN CHLORIDE ER 10 MG PO TB24 Oral Take 10 mg by mouth daily.      BP 112/79  Pulse 115  Resp 17  Ht 4\' 8"  (1.422 m)  Wt 102 lb 11.8 oz (46.6 kg)  BMI 23.03 kg/m2  SpO2 98%  Physical Exam  Nursing note and vitals reviewed. Constitutional: She is oriented to person, place, and time. She appears well-developed.  HENT:  Head: Atraumatic.  Mouth/Throat: Oropharynx is clear and moist.  Eyes: Conjunctivae and EOM are normal. Pupils are equal, round, and reactive to light.  Neck: Normal range of motion. Neck supple.  Cardiovascular: Regular rhythm, normal heart sounds and intact  distal pulses.        tachycardic  Pulmonary/Chest: Effort normal and breath sounds normal. No respiratory distress. She has no wheezes. She has no rales.       Dec breath sounds left Full BS on right Tachypnea Trachea midline  Abdominal: Soft. She exhibits no distension. There is no tenderness. There is no rebound and no guarding.  Musculoskeletal: Normal range of motion. She exhibits no edema and no tenderness.       No calf ttp  Neurological: She is alert and oriented to person, place, and time.  Skin: Skin is warm and dry. No rash noted.  Psychiatric: She has a normal mood and affect.    Date: 09/09/2011  Rate: 157  Rhythm: normal sinus rhythm  QRS Axis: normal  Intervals: normal  ST/T Wave abnormalities: nonspecific ST changes  Conduction Disutrbances:none  Narrative Interpretation:   Old EKG Reviewed: none available    ED Course  Needle decompression Date/Time: 09/09/2011 12:53 AM Performed by: Forbes Cellar Authorized by: Forbes Cellar Consent: Verbal consent obtained. The procedure was performed in an emergent situation. Consent given by: patient Patient understanding: patient states understanding of the procedure being performed Patient consent: the patient's understanding of the procedure matches consent given Procedure consent: procedure consent matches procedure scheduled Imaging studies: imaging studies available Patient identity confirmed: verbally with patient Time out: Immediately prior to procedure a "time out" was called to verify the correct patient, procedure, equipment, support staff and site/side marked as required. Local anesthesia used: yes Anesthesia: local infiltration Local anesthetic: lidocaine 1% without epinephrine Anesthetic total: 2 ml Patient sedated: no Patient tolerance: Patient tolerated the procedure well with no immediate complications.  Procedural sedation Date/Time: 09/09/2011 12:54 AM Performed by: Forbes Cellar Authorized  by: Forbes Cellar Consent: Verbal consent obtained. The procedure was performed in an emergent situation. Risks and benefits: risks, benefits and alternatives were discussed Consent given by: patient Patient understanding: patient states understanding of the procedure being performed Patient consent: the patient's understanding of the procedure matches consent given Procedure consent: procedure consent matches procedure scheduled Required items: required blood products, implants, devices, and special equipment available Patient identity confirmed: verbally with patient Time out: Immediately prior to procedure a "time out" was called to verify the correct patient, procedure, equipment, support staff and site/side marked as required. Local anesthesia used: no Patient sedated: yes Sedation type: moderate (conscious) sedation Sedatives:  propofol Vitals: Vital signs were monitored during sedation. Patient tolerance: Patient tolerated the procedure well with no immediate complications. Comments: Conscious sedation provided during chest tube insertion. See nursing note for details   (including critical care time)  CRITICAL CARE Performed by: Forbes Cellar   Total critical care time: 40 minutes  Critical care time was exclusive of separately billable procedures and treating other patients.  Critical care was necessary to treat or prevent imminent or life-threatening deterioration.  Critical care was time spent personally by me on the following activities: development of treatment plan with patient and/or surrogate as well as nursing, discussions with consultants, evaluation of patient's response to treatment, examination of patient, obtaining history from patient or surrogate, ordering and performing treatments and interventions, ordering and review of laboratory studies, ordering and review of radiographic studies, pulse oximetry and re-evaluation of patient's condition.  Labs Reviewed    DIFFERENTIAL - Abnormal; Notable for the following:    Neutrophils Relative 38 (*)    Lymphocytes Relative 52 (*)    Lymphs Abs 4.9 (*)    All other components within normal limits  COMPREHENSIVE METABOLIC PANEL - Abnormal; Notable for the following:    Potassium 2.8 (*)    Glucose, Bld 134 (*)    Calcium 10.6 (*)    Total Protein 8.4 (*)    All other components within normal limits  POCT I-STAT 3, BLOOD GAS (G3+) - Abnormal; Notable for the following:    pH, Arterial 7.326 (*)    Acid-base deficit 4.0 (*)    All other components within normal limits  CBC  PRO B NATRIURETIC PEPTIDE  TROPONIN I  APTT  PROTIME-INR   Dg Chest Portable 1 View  09/09/2011  *RADIOLOGY REPORT*  Clinical Data: Status post left-sided chest tube placement for pneumothorax; respiratory distress.  PORTABLE CHEST - 1 VIEW  Comparison: Chest radiograph performed earlier today at 11:25 p.m.  Findings: The lungs are significantly hypoexpanded.  There has been interval placement of a left apical chest tube, with apparent re- expansion of the left lung.  No definite pneumothorax is now seen. However, the lungs are not well assessed due to the degree of hypoexpansion.  Bilateral airspace opacities could reflect atelectasis or pneumonia, on comparison with prior CT from 08/14/2011.  The unusual pattern of air overlying the mediastinum reflects underlying blebs.  The mediastinum is not well assessed due to adjacent consolidation. No acute osseous abnormalities are identified.  IMPRESSION:  1.  No definite evidence of pneumothorax, status post placement of left apical chest tube. 2.  The lungs are markedly hypoexpanded, with dense bilateral airspace opacities, more prominent on the left.  This could reflect atelectasis or pneumonia, on correlation with prior CT; the unusual pattern of air overlying the mediastinum reflects underlying blebs.  Original Report Authenticated By: Tonia Ghent, M.D.   Dg Chest Portable 1  View  09/08/2011  *RADIOLOGY REPORT*  Clinical Data: Decompression of left pneumothorax.  PORTABLE CHEST - 1 VIEW  Comparison: Earlier the same day  Findings: 2325 hours.  Marked thoracolumbar scoliosis distorts the thoracic and cardiomediastinal anatomy.  There is an apparent angiocath or small sheath no superimposed on the upper left chest wall.  The volume of gas within the left pleural space has decreased in the interval and there is some re-expansion of the left lung.  The left pneumothorax remains large, but there does appear to be less rightward shift of cardiomediastinal structures compared to the previous film.  There is some persistent  to probable atelectasis at the right base. Cardiopericardial silhouette may be enlarged, but the distortion by scoliosis makes assessment difficult. Telemetry leads overlie the chest.  IMPRESSION: Interval evacuation of some of the left pleural air with slightly less rightward shift of cardiomediastinal structures although the marked thoracolumbar scoliosis hinders this assessment.  Right base atelectasis.  Original Report Authenticated By: ERIC A. MANSELL, M.D.   Dg Chest Portable 1 View  09/08/2011  *RADIOLOGY REPORT*  Clinical Data: Respiratory distress.  Shortness of breath.  PORTABLE CHEST - 1 VIEW  Comparison: Chest radiograph performed 08/22/2011  Findings: There is complete collapse of the left lung, with rightward shift of the mediastinum, compatible with a tension pneumothorax.  Right basilar airspace opacification likely reflects residual atelectasis.  No definite pleural effusion is identified.  The cardiomediastinal silhouette is difficult to fully assess due to overlying pneumothorax lucency.  No acute osseous abnormalities are identified.  IMPRESSION:  1.  Complete collapse of the left lung, with rightward shift of the mediastinum, compatible with a tension pneumothorax. 2.  Right basilar airspace opacity likely reflects residual atelectasis.  Critical  Value/emergent results were called by telephone at the time of interpretation on 09/08/2011  at 10:58 p.m.  to  Dr. Marliss Czar- Ned Card, who verbally acknowledged these results.  Original Report Authenticated By: Tonia Ghent, M.D.     1. Tension pneumothorax, spontaneous   2. Recurrent spontaneous pneumothorax     MDM  H/o recurrent pneumothorax pw tension pneumothorax. On initial presentation patient saturating > 90% NR with elevated HR, stable blood pressure. She began to become more hypoxic and agitated. Needle decompression performed at that time with some relief of sx. CT surgery placed Left chest tube. Recommends PCCM and ICU admission. D/W ICU who will evaluate patient in the Emergency Department.        Forbes Cellar, MD 09/09/11 [phone]

## 2011-09-09 NOTE — H&P (Addendum)
Name: Rhonda Ramos MRN: [id number] DOB: [birth year]  LOS: 1  CRITICAL CARE ADMISSION NOTE  History of Present Illness: This is a 46 y/o female with spina bifida, scoliosis and chronic pain syndrome who was recently discharged from this facility after a prolonged mechanical ventilatory stay for ARDS related to pneumonia presented to the [facility] ED on 5/6 with acute onset shortness of breath.  She was found to have a large left sided pneumothorax with mediastinal shift so needle decompression was performed by the EDP.  CTCS (Dr. Cornelius Moras) performed a left chest tube and PCCM was consulted for admission.  Lines / Drains: 5/6 L chest tube >>  Cultures / Sepsis markers: 5/7 blood cx >> 5/7 urine strep/pneumo ag >>  Antibiotics: 5/7 vanc (HCAP) >> 5/7 zosyn (HCAP) >>  Tests / Events: 5/6 L chest tube (Dr. Owen)>>> 5/7- no distress, improved O2 needs    Vital Signs:   Filed Vitals:   09/09/11 0800 09/09/11 0900 09/09/11 1000 09/09/11 1146  BP: 120/71 113/73 127/78   Pulse: 90 102 106   Temp: 100 F (37.8 C)   99.7 F (37.6 C)  TempSrc: Oral   Oral  Resp: 21 20 21    Height:      Weight:      SpO2: 95% 95% 94%   FiO2 100%  Physical Examination: Gen:  no respiratory distress HEENT: NCAT Neck: trach midline PULM: reduced Chest: left chest tube in place, no leak CV: s1 s2 regular, no mgr, no JVD AB: BS+, soft, nontender, no hsm Ext: warm, no edema, no clubbing, no cyanosis Derm: no rash or skin breakdown Neuro: awake, alert,  Psyche: very anxious  Labs and Imaging:   CBC    Component Value Date/Time   WBC 13.3* 09/09/2011 0428   RBC 3.94 09/09/2011 0428   HGB 11.6* 09/09/2011 0428   HCT 34.7* 09/09/2011 0428   PLT 223 09/09/2011 0428   MCV 88.1 09/09/2011 0428   MCH 29.4 09/09/2011 0428   MCHC 33.4 09/09/2011 0428   RDW 14.5 09/09/2011 0428   LYMPHSABS 4.9* 09/08/2011 2248   MONOABS 0.7 09/08/2011 2248   EOSABS 0.3 09/08/2011 2248   BASOSABS 0.1 09/08/2011 2248    BMET    Component Value  Date/Time   NA 140 09/09/2011 0428   K 3.2* 09/09/2011 0428   CL 108 09/09/2011 0428   CO2 21 09/09/2011 0428   GLUCOSE 127* 09/09/2011 0428   BUN 10 09/09/2011 0428   CREATININE 0.70 09/09/2011 0428   CALCIUM 9.0 09/09/2011 0428   GFRNONAA >90 09/09/2011 0428   GFRAA >90 09/09/2011 0428    5/7 CXR: L chest tube in place, L lung up; questionable left lower lobe infiltrate  Assessment and Plan: 46 y/o female with recurrent pneumothoraces likely due to persistent bilateral blebs presented to [facility] on 5/6 with an acute left sided tension pneumothorax.  She is much improved after receiving the chest tube, but there is a question of a left infiltrate after placement of the chest tube.   NEUROLOGIC DEPRESSION AND ANXIETY (11/13/2009)   Assessment: no acute issues ; also has history of severe anxiety   Plan:   -continue home SSRI  -continue home clonazepam prn  -pt when able  -fentanyl patch, may need fent prn  Chronic pain syndrome (11/13/2009)   Assessment:    Plan:   -continue home fentanyl patch  -prn morphine, watch for over sedation  Seizure (08/28/2010)   Assessment: no acute issues   Plan:   -  continue home keppra  PULMONARY Recurrent spontaneous pneumothorax (08/06/2011)   Assessment: presumably due to bilateral blebs which are apparently idiopathic; had stapling and mechanical pleuridesis of right blebs in February but then developed R pneumothorax in April; difficult situation as this is likely to be refractory to any surgical or medical intervention   Plan:   -discuss further treatment options with CTCS (chemical pleuridesis of left?)  - leak, to suction , per cvts management  -concenrn atx / infiltrates,   -O2 needs lower however  -IS  -low threshold use nIMV  Pneumonia (08/11/2011)   Assessment: She has what looks like a bigger infiltrate on the left than seen on previous CXR's; She is doing well after the chest tube placement, but given her tenuous respiratory status and history of recent  acute decline from an infiltrated noted after a chest tube I will admit her to the ICU for close monitoring and will start empiric antibiotics for HCAP.    Plan:   -vanc/zosyn, maintain m,see worsening infiltrates  Tension pneumothorax (09/09/2011)   Assessment: s/p L chest tube placement   Plan:   -to suction per CTCS  CARDIOVASCULAR Sinus tachycardia improving with chest tube, pain meds   Plan:  -KVO fluids once arrived to floor  -Tele monitoring  RENAL URINARY INCONTINENCE (11/13/2009)   Assessment:    Plan:   -foley catheter inserted in ED, remove once ambulatory, top straight home cath regimen  GASTROINTESTINAL GERD   Plan:  -home PPI  HEMATOLOGIC No issues  INFECTIOUS See pneumonia  ENDOCRINE No issues  Best practices / Disposition: ICU status, PCCM primary service  Feeding/protein malnutrition: regular diet Analgesia: morphine prn Sedation: clonazepam for anxiety prn Thromboprophylaxis:sub q hep HOB >30 degrees Ulcer prophylaxis: home PPI Glucose control/hyperglycemia: monitor CBG  Keep in icu , see pcxr today, repeat in am   Mcarthur Rossetti. Tyson Alias, MD, FACP Pgr: [phone] [facility]

## 2011-09-09 NOTE — Progress Notes (Signed)
   CARDIOTHORACIC SURGERY PROGRESS NOTE  Subjective: Anxious, but otherwise feels okay.  Remarkably not complaining of pain.  Objective: Vital signs in last 24 hours: Temp:  [99.1 F (37.3 C)-100 F (37.8 C)] 99.7 F (37.6 C) (05/07 1146) Pulse Rate:  [90-157] 96  (05/07 1500) Cardiac Rhythm:  [-] Sinus tachycardia (05/07 1400) Resp:  [15-32] 20  (05/07 1500) BP: (103-187)/(63-113) 126/87 mmHg (05/07 1500) SpO2:  [80 %-100 %] 95 % (05/07 1500) Weight:  [46.3 kg (102 lb 1.2 oz)-46.6 kg (102 lb 11.8 oz)] 46.3 kg (102 lb 1.2 oz) (05/07 0300)  Physical Exam:  Rhythm:   sinus  Breath sounds: Fairly clear  Heart sounds:  RRR  Incisions:  n/a  Abdomen:  soft  Extremities:  warm  Chest tube(s):  + small air leak   Intake/Output from previous day: 05/06 0701 - 05/07 0700 In: 1320 [I.V.:1070; IV Piggyback:250] Out: 1055 [Urine:1005; Chest Tube:50] Intake/Output this shift: Total I/O In: 912.5 [P.O.:720; I.V.:30; IV Piggyback:162.5] Out: 1075 [Urine:1075]  Lab Results:  Basename 09/09/11 0428 09/08/11 2248  WBC 13.3* 9.6  HGB 11.6* 14.7  HCT 34.7* 41.5  PLT 223 264   BMET:  Basename 09/09/11 0428 09/08/11 2248  NA 140 138  K 3.2* 2.8*  CL 108 100  CO2 21 20  GLUCOSE 127* 134*  BUN 10 11  CREATININE 0.70 0.75  CALCIUM 9.0 10.6*    CBG (last 3)   Basename 09/09/11 0246  GLUCAP 134*    CXR:  Chest tube in good position, no pneumothorax  Assessment/Plan:   First time spontaneous pneumothorax on left side  Recent history of recurrent spontaneous pneumothorax on right side  Likely recurrent pneumonia with recent history of pneumonia complicated by respiratory failure  Chronic medical illness as outlined previously  Chronic anxiety and narcotic dependence  I would continue chest tube to suction for now and continue medical Rx for pneumonia  Purcell Nails 09/09/2011 4:57 PM

## 2011-09-09 NOTE — ED Notes (Signed)
CT inserted, dressing applied by Dr. Cornelius Moras

## 2011-09-09 NOTE — Progress Notes (Signed)
Spoke with Rhonda Ramos regarding risk factors for infection by leaving in foley catheter vs. intermittent in and out catheterizations.   Rhonda Ramos states that she believes there is less risk for infection by leaving foley catheter in.

## 2011-09-10 ENCOUNTER — Inpatient Hospital Stay (HOSPITAL_COMMUNITY): Payer: Medicare Other

## 2011-09-10 DIAGNOSIS — F329 Major depressive disorder, single episode, unspecified: Secondary | ICD-10-CM

## 2011-09-10 DIAGNOSIS — R87619 Unspecified abnormal cytological findings in specimens from cervix uteri: Secondary | ICD-10-CM

## 2011-09-10 LAB — BASIC METABOLIC PANEL
Chloride: 107 mEq/L (ref 96–112)
GFR calc Af Amer: >90 mL/min (ref >90)
GFR calc non Af Amer: 80 mL/min — ABNORMAL LOW (ref >90)
Potassium: 4.4 mEq/L (ref 3.5–5.1)
Sodium: 140 mEq/L (ref 135–145)

## 2011-09-10 LAB — CBC
HCT: 36.1 % (ref 36.0–46.0)
Hemoglobin: 12 g/dL (ref 12.0–15.0)
MCHC: 33.2 g/dL (ref 30.0–36.0)
RBC: 4.02 MIL/uL (ref 3.87–5.11)
WBC: 8.6 10*3/uL (ref 4.0–10.5)

## 2011-09-10 LAB — DIFFERENTIAL
Basophils Relative: 1 % (ref 0–1)
Lymphocytes Relative: 23 % (ref 12–46)
Monocytes Absolute: 0.9 10*3/uL (ref 0.1–1.0)
Monocytes Relative: 10 % (ref 3–12)
Neutro Abs: 5.5 10*3/uL (ref 1.7–7.7)
Neutrophils Relative %: 64 % (ref 43–77)

## 2011-09-10 MED ORDER — SALINE SPRAY 0.65 % NA SOLN
1.0000 | NASAL | Status: DC | PRN
Start: 2011-09-10 — End: 2011-09-21
  Administered 2011-09-10: 1 via NASAL
  Filled 2011-09-10 (×2): qty 44

## 2011-09-10 NOTE — Progress Notes (Signed)
Name: Rhonda Ramos MRN: [id number] DOB: 13-Nov-1965  LOS: 2  CRITICAL CARE ADMISSION NOTE  History of Present Illness: This is a 46 y/o female with spina bifida, scoliosis and chronic pain syndrome who was recently discharged from this facility after a prolonged mechanical ventilatory stay for ARDS related to pneumonia presented to the [facility] ED on 5/6 with acute onset shortness of breath.  She was found to have a large left sided pneumothorax with mediastinal shift so needle decompression was performed by the EDP.  [facility] (Dr. Cornelius Moras) performed a left chest tube and PCCM was consulted for admission.  Lines / Drains: 5/6 L chest tube >>  Cultures / Sepsis markers: 5/7 blood cx >> 5/7 urine strep/pneumo ag >>  Antibiotics: 5/7 vanc (HCAP) >> 5/7 zosyn (HCAP) >>  Tests / Events: 5/6 L chest tube (Dr. Owen)>>> 5/7- no distress, improved O2 needs 5/8- improved aeration and clinical status    Vital Signs:   Filed Vitals:   09/10/11 0800 09/10/11 0825 09/10/11 0900 09/10/11 1205  BP: 109/75  105/68   Pulse: 95  93   Temp:  99 F (37.2 C)  98.6 F (37 C)  TempSrc:  Oral  Oral  Resp: 20  20   Height:      Weight:      SpO2: 96%  97%   FiO2 100%  Physical Examination: Gen:  no respiratory distress in chair HEENT: NCAT Neck: trach midline PULM: improved lung sounds left, cta rt  Chest: left chest tube in place,leak CV: s1 s2 regular, no mgr, no JVD AB: BS+, soft, nontender, no hsm Ext: cachetctic Derm: no rash or skin breakdown Neuro: awake, alert,  Psyche: improved anxious  Labs and Imaging:   CBC    Component Value Date/Time   WBC 8.6 09/10/2011 0434   RBC 4.02 09/10/2011 0434   HGB 12.0 09/10/2011 0434   HCT 36.1 09/10/2011 0434   PLT 194 09/10/2011 0434   MCV 89.8 09/10/2011 0434   MCH 29.9 09/10/2011 0434   MCHC 33.2 09/10/2011 0434   RDW 15.0 09/10/2011 0434   LYMPHSABS 2.0 09/10/2011 0434   MONOABS 0.9 09/10/2011 0434   EOSABS 0.3 09/10/2011 0434   BASOSABS 0.1 09/10/2011 0434     BMET    Component Value Date/Time   NA 140 09/10/2011 0434   K 4.4 09/10/2011 0434   CL 107 09/10/2011 0434   CO2 25 09/10/2011 0434   GLUCOSE 139* 09/10/2011 0434   BUN 6 09/10/2011 0434   CREATININE 0.87 09/10/2011 0434   CALCIUM 10.0 09/10/2011 0434   GFRNONAA 80* 09/10/2011 0434   GFRAA >90 09/10/2011 0434    5/7 CXR: L chest tube in place, L lung up; questionable left lower lobe infiltrate  Assessment and Plan: 46 y/o female with recurrent pneumothoraces likely due to persistent bilateral blebs presented to [facility] on 5/6 with an acute left sided tension pneumothorax.  She is much improved after receiving the chest tube, but there is a question of a left infiltrate after placement of the chest tube.   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)   Assessment: presumably due to bilateral blebs which are apparently idiopathic; had stapling and mechanical pleuridesis of right blebs in February but then developed R pneumothorax in April; difficult situation as this is likely to be refractory to any surgical or medical intervention   Plan:   -more impressed with atx left lung, better aeration noted, good clinical status  If this was pNA, would think resp status would be more comprommissed  Leak, suction  no need nimv  pcxr in am   IS  Pneumonia (08/11/2011)   Assessment: She has what looks like a bigger infiltrate on the left than seen on previous CXR's;  She is doing well after the chest tube placement, but given her tenuous respiratory status and history of recent acute decline from an infiltrated noted after a chest tube I will admit her to the ICU for close monitoring and will start empiric antibiotics for HCAP.    Plan:   -vanc/zosyn, in am narrow, more impressed atx, post large pTX  Tension pneumothorax (09/09/2011)   Assessment: s/p L chest tube placement   Plan:   -to suction per CTCS, agree  CARDIOVASCULAR Sinus tachycardia improving with chest tube, pain meds   Plan:  -KVO fluids once arrived to floor  -Tele monitoring  Has some autodiuresis, allow  RENAL URINARY INCONTINENCE (11/13/2009)   Assessment:    Plan:   -foley catheter inserted in ED, IP rec foley and not straight cathing now in nosocomial environmenet  GASTROINTESTINAL GERD   Plan:  -home PPI  diet  HEMATOLOGIC No issues  Best practices / Disposition: ICU status, PCCM primary service  Feeding/protein malnutrition: regular diet Analgesia: morphine prn Sedation: clonazepam for anxiety prn, see neuro Thromboprophylaxis:sub q hep HOB >30 degrees Ulcer prophylaxis: home PPI Glucose control/hyperglycemia: monitor CBG   Mcarthur Rossetti. Tyson Alias, MD, FACP Pgr: [phone] [facility]

## 2011-09-10 NOTE — Progress Notes (Signed)
eLink Physician-Brief Progress Note Patient Name: ALANAH SAKUMA DOB: 07-Mar-1966 MRN: [id number]  Date of Service  09/10/2011   HPI/Events of Note     eICU Interventions  Saline nasal spray for oxygen drying up her nose   Intervention Category Intermediate Interventions: Other:  Gera Inboden 09/10/2011, 9:21 PM

## 2011-09-10 NOTE — Progress Notes (Addendum)
[address]            [place] [address]          [phone]          Subjective: C/O discomfort from tube  Objective   Temp:  [97.7 F (36.5 C)-100 F (37.8 C)] 98.9 F (37.2 C) (05/08 0412) Pulse Rate:  [81-106] 97  (05/08 0700) Resp:  [18-29] 23  (05/08 0700) BP: (102-136)/(57-99) 123/77 mmHg (05/08 0700) SpO2:  [88 %-98 %] 95 % (05/08 0700) Weight:  [100 lb 15.5 oz (45.8 kg)] 100 lb 15.5 oz (45.8 kg) (05/08 0400)   Intake/Output Summary (Last 24 hours) at 09/10/11 0754 Last data filed at 09/10/11 0600  Gross per 24 hour  Intake   1370 ml  Output   2155 ml  Net   -785 ml       General appearance: alert, cooperative and no distress Heart: regular rate and rhythm Lungs: diminished somewhat in bases Abdomen: soft, nontender Extremities: minor edema  Lab Results:  [facility] 09/10/11 0434 09/09/11 0428  NA 140 140  K 4.4 3.2*  CL 107 108  CO2 25 21  GLUCOSE 139* 127*  BUN 6 10  CREATININE 0.87 0.70  CALCIUM 10.0 9.0  MG -- --  PHOS -- --    Basename 09/08/11 2248  AST 26  ALT 25  ALKPHOS 113  BILITOT 0.3  PROT 8.4*  ALBUMIN 4.2   No results found for this basename: LIPASE:2,AMYLASE:2 in the last 72 hours  Basename 09/10/11 0434 09/09/11 0428 09/08/11 2248  WBC 8.6 13.3* --  NEUTROABS 5.5 -- 3.6  HGB 12.0 11.6* --  HCT 36.1 34.7* --  MCV 89.8 88.1 --  PLT 194 223 --    Basename 09/08/11 2248  CKTOTAL --  CKMB --  TROPONINI <0.30   No components found with this basename: POCBNP:3 No results found for this basename: DDIMER in the last 72 hours No results found for this basename: HGBA1C in the last 72 hours No results found for this basename: CHOL,HDL,LDLCALC,TRIG,CHOLHDL in the last 72 hours No results found for this basename: TSH,T4TOTAL,FREET3,T3FREE,THYROIDAB in the last 72 hours No results found for this basename: VITAMINB12,FOLATE,FERRITIN,TIBC,IRON,RETICCTPCT in the last 72  hours  Medications: Scheduled    . fentaNYL  50 mcg Transdermal Q72H  . FLUoxetine  60 mg Oral Daily  . heparin  5,000 Units Subcutaneous Q8H  . levETIRAcetam  250 mg Oral Daily  . mulitivitamin with minerals  1 tablet Oral Daily  . oxybutynin  10 mg Oral Daily  . pantoprazole  40 mg Oral Q1200  . piperacillin-tazobactam (ZOSYN)  IV  3.375 g Intravenous Q8H  . potassium chloride  40 mEq Oral Q4H  . vancomycin  500 mg Intravenous Q12H  . DISCONTD: FLUoxetine  60 mg Oral Daily  . DISCONTD: potassium chloride  40 mEq Oral Once     Radiology/Studies:  Dg Chest Portable 1 View  09/09/2011  *RADIOLOGY REPORT*  Clinical Data: Status post left-sided chest tube placement for pneumothorax; respiratory distress.  PORTABLE CHEST - 1 VIEW  Comparison: Chest radiograph performed earlier today at 11:25 p.m.  Findings: The lungs are significantly hypoexpanded.  There has been interval placement of a left apical chest tube, with apparent re- expansion of the left lung.  No definite pneumothorax is now seen. However, the lungs are not well assessed due to  the degree of hypoexpansion.  Bilateral airspace opacities could reflect atelectasis or pneumonia, on comparison with prior CT from 08/14/2011.  The unusual pattern of air overlying the mediastinum reflects underlying blebs.  The mediastinum is not well assessed due to adjacent consolidation. No acute osseous abnormalities are identified.  IMPRESSION:  1.  No definite evidence of pneumothorax, status post placement of left apical chest tube. 2.  The lungs are markedly hypoexpanded, with dense bilateral airspace opacities, more prominent on the left.  This could reflect atelectasis or pneumonia, on correlation with prior CT; the unusual pattern of air overlying the mediastinum reflects underlying blebs.  Original Report Authenticated By: Tonia Ghent, M.D.   Dg Chest Portable 1 View  09/08/2011  *RADIOLOGY REPORT*  Clinical Data: Decompression of left  pneumothorax.  PORTABLE CHEST - 1 VIEW  Comparison: Earlier the same day  Findings: 2325 hours.  Marked thoracolumbar scoliosis distorts the thoracic and cardiomediastinal anatomy.  There is an apparent angiocath or small sheath no superimposed on the upper left chest wall.  The volume of gas within the left pleural space has decreased in the interval and there is some re-expansion of the left lung.  The left pneumothorax remains large, but there does appear to be less rightward shift of cardiomediastinal structures compared to the previous film.  There is some persistent to probable atelectasis at the right base. Cardiopericardial silhouette may be enlarged, but the distortion by scoliosis makes assessment difficult. Telemetry leads overlie the chest.  IMPRESSION: Interval evacuation of some of the left pleural air with slightly less rightward shift of cardiomediastinal structures although the marked thoracolumbar scoliosis hinders this assessment.  Right base atelectasis.  Original Report Authenticated By: ERIC A. MANSELL, M.D.   Dg Chest Portable 1 View  09/08/2011  *RADIOLOGY REPORT*  Clinical Data: Respiratory distress.  Shortness of breath.  PORTABLE CHEST - 1 VIEW  Comparison: Chest radiograph performed 08/22/2011  Findings: There is complete collapse of the left lung, with rightward shift of the mediastinum, compatible with a tension pneumothorax.  Right basilar airspace opacification likely reflects residual atelectasis.  No definite pleural effusion is identified.  The cardiomediastinal silhouette is difficult to fully assess due to overlying pneumothorax lucency.  No acute osseous abnormalities are identified.  IMPRESSION:  1.  Complete collapse of the left lung, with rightward shift of the mediastinum, compatible with a tension pneumothorax. 2.  Right basilar airspace opacity likely reflects residual atelectasis.  Critical Value/emergent results were called by telephone at the time of interpretation on  09/08/2011  at 10:58 p.m.  to  Dr. Marliss Czar- Ned Card, who verbally acknowledged these results.  Original Report Authenticated By: Tonia Ghent, M.D.   Chest tube + air leak  INR: Will add last result for INR, ABG once components are confirmed Will add last 4 CBG results once components are confirmed  Assessment/Plan: S/P right chest tube for pneumothorax  1. Continue chest tube to suction. Hoping to avoid any further surgical intervention .      LOS: 2 days    GOLD,WAYNE E 5/8/20137:54 AM    I have seen and examined the patient and agree with the assessment and plan as outlined.  Small, intermittent air leak on exam.  Will keep tube on suction today and try to place on water seal tomorrow if stable.  Somer Trotter H 09/10/2011 3:17 PM

## 2011-09-11 ENCOUNTER — Inpatient Hospital Stay (HOSPITAL_COMMUNITY): Payer: Medicare Other

## 2011-09-11 DIAGNOSIS — J930 Spontaneous tension pneumothorax: Secondary | ICD-10-CM

## 2011-09-11 LAB — PHOSPHORUS: Phosphorus: 3.3 mg/dL (ref 2.3–4.6)

## 2011-09-11 LAB — BASIC METABOLIC PANEL
BUN: 9 mg/dL (ref 6–23)
CO2: 19 mEq/L (ref 19–32)
Chloride: 107 mEq/L (ref 96–112)
Creatinine, Ser: 0.71 mg/dL (ref 0.50–1.10)
GFR calc Af Amer: >90 mL/min (ref >90)
Potassium: 3.9 mEq/L (ref 3.5–5.1)

## 2011-09-11 MED ORDER — OXYCODONE HCL 5 MG PO TABS
5.0000 mg | ORAL_TABLET | ORAL | Status: DC | PRN
  Administered 2011-09-11 – 2011-09-16 (×24): 5 mg via ORAL
  Filled 2011-09-11 (×29): qty 1

## 2011-09-11 MED ORDER — FENTANYL CITRATE 0.05 MG/ML IJ SOLN
25.0000 ug | INTRAMUSCULAR | Status: DC | PRN
  Administered 2011-09-11: 50 ug via INTRAVENOUS
  Administered 2011-09-11: 25 ug via INTRAVENOUS
  Administered 2011-09-11 – 2011-09-13 (×14): 50 ug via INTRAVENOUS
  Filled 2011-09-11 (×16): qty 2

## 2011-09-11 MED ORDER — OXYCODONE-ACETAMINOPHEN 5-325 MG PO TABS
2.0000 | ORAL_TABLET | ORAL | Status: DC | PRN
  Administered 2011-09-11: 2 via ORAL
  Filled 2011-09-11: qty 2

## 2011-09-11 MED ORDER — LOPERAMIDE HCL 2 MG PO CAPS
4.0000 mg | ORAL_CAPSULE | Freq: Every day | ORAL | Status: DC | PRN
  Administered 2011-09-11 – 2011-09-12 (×3): 4 mg via ORAL
  Filled 2011-09-11 (×4): qty 2

## 2011-09-11 MED ORDER — OXYCODONE-ACETAMINOPHEN 5-325 MG PO TABS
1.0000 | ORAL_TABLET | Freq: Four times a day (QID) | ORAL | Status: DC | PRN
  Administered 2011-09-11: 1 via ORAL
  Filled 2011-09-11: qty 1

## 2011-09-11 MED ORDER — MOXIFLOXACIN HCL 400 MG PO TABS
400.0000 mg | ORAL_TABLET | Freq: Every day | ORAL | Status: DC
  Administered 2011-09-11 – 2011-09-12 (×2): 400 mg via ORAL
  Filled 2011-09-11 (×3): qty 1

## 2011-09-11 MED ORDER — FENTANYL 25 MCG/HR TD PT72
50.0000 ug | MEDICATED_PATCH | TRANSDERMAL | Status: DC
  Administered 2011-09-11 – 2011-09-12 (×2): 50 ug via TRANSDERMAL
  Filled 2011-09-11 (×2): qty 1
  Filled 2011-09-11: qty 2

## 2011-09-11 NOTE — Progress Notes (Signed)
Patient provided with an update re: plan of care/bed assignment on department 3700 (room 3729). Patient anxious and tearful, states that she is afraid of the transition and scared to move because of her pain and discomfort. Reassurance and emotional support provided. Patient also medicated percocet for pain and klonopin for anxiety prior to transfer. Report called to [place], RN on 3700.

## 2011-09-11 NOTE — Progress Notes (Addendum)
Name: BRITANIA SHREEVE MRN: [id number] DOB: [birth year]  LOS: 3  CRITICAL CARE PROGRESS NOTE  History of Present Illness: This is a 46 y/o female with spina bifida, scoliosis and chronic pain syndrome who was recently discharged from this facility after a prolonged mechanical ventilatory stay for ARDS related to pneumonia presented to the [facility] ED on 5/6 with acute onset shortness of breath.  She was found to have a large left sided pneumothorax with mediastinal shift so needle decompression was performed by the EDP.  TCTS (Dr. Cornelius Moras) performed a left chest tube and PCCM was consulted for admission.  Lines / Drains: 5/6 L chest tube (perCVTS) >>   Cultures / Sepsis markers: 5/7 blood cx >>  Antibiotics: 5/7 vanc (HCAP) >> 5/9 5/7 zosyn (HCAP) >> 5/9 Avelox 5/9 >>   Tests / Events: 5/6 L chest tube (Dr. Owen)>>>  Subjective/ Overnight:  C/o severe pain L CT site.  Acetaminophen in percocet upsets stomach.     Vital Signs:   Filed Vitals:   09/11/11 0400 09/11/11 0500 09/11/11 0600 09/11/11 0654  BP: 108/77 108/68 118/100 116/84  Pulse: 76 79 87 81  Temp:    98.8 F (37.1 C)  TempSrc:    Oral  Resp: 17 17 21 18   Height:    4\' 8"  (1.422 m)  Weight:   103 lb 9.9 oz (47 kg) 107 lb 5.8 oz (48.7 kg)  SpO2: 98% 96% 98% 96%  3L [place]   Physical Examination: General: chronically ill appearing female, anxious Neuro: drowsy, easily arousable, conversant, MAE, gen weakness CV: s1s2 rrr PULM: resps even non labored. Diminished L, few scattered ronchi R  GI: abd soft, +bs Extremities: warm and dry no edema   Labs and Imaging:   CBC    Component Value Date/Time   WBC 8.6 09/10/2011 0434   RBC 4.02 09/10/2011 0434   HGB 12.0 09/10/2011 0434   HCT 36.1 09/10/2011 0434   PLT 194 09/10/2011 0434   MCV 89.8 09/10/2011 0434   MCH 29.9 09/10/2011 0434   MCHC 33.2 09/10/2011 0434   RDW 15.0 09/10/2011 0434   LYMPHSABS 2.0 09/10/2011 0434   MONOABS 0.9 09/10/2011 0434   EOSABS 0.3 09/10/2011 0434   BASOSABS 0.1  09/10/2011 0434    BMET    Component Value Date/Time   NA 137 09/11/2011 0450   K 3.9 09/11/2011 0450   CL 107 09/11/2011 0450   CO2 19 09/11/2011 0450   GLUCOSE 88 09/11/2011 0450   BUN 9 09/11/2011 0450   CREATININE 0.71 09/11/2011 0450   CALCIUM 9.4 09/11/2011 0450   GFRNONAA >90 09/11/2011 0450   GFRAA >90 09/11/2011 0450    CXR: low volumes with bilateral ATX. No PTX   Assessment and Plan: 46 y/o female with recurrent pneumothoraces likely due to persistent bilateral blebs presented to [facility] on 5/6 with an acute left sided tension pneumothorax.  She is much improved after receiving the chest tube, but there is a question of a left infiltrate after placement of the chest tube.   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  Afebrile, no leukocytosis    Plan:   -narrow to Avelox 5/9  Tension pneumothorax (09/09/2011)   Assessment: s/p L chest tube placement   Plan:   -to suction per  CTCS  -f/u cxr     CARDIOVASCULAR Sinus tachycardia improving with chest tube, pain meds   Plan:  -KVO fluids   -Tele monitoring   RENAL URINARY INCONTINENCE (11/13/2009)   Assessment: i/o cath at home   Plan:   -foley catheter inserted in ED, IP rec foley and not straight cathing now in nosocomial environment   GASTROINTESTINAL GERD   Plan:  -home PPI  -diet  HEMATOLOGIC No issues  Best practices / Disposition:   Feeding/protein malnutrition: regular diet Analgesia: morphine prn Sedation: clonazepam for anxiety prn, see neuro Thromboprophylaxis:sub q hep HOB >30 degrees Ulcer prophylaxis: home PPI Glucose control/hyperglycemia: monitor CBG  Will ask [facility] to assume care 5/10 as CVTS management of Chest tube.   WHITEHEART,KATHRYN, NP 09/11/2011  11:20 AM Pager: [phone]    There is no PTX on CXR and I cannot detect an air leak even with cough maneuver. Will change to water seal. Follow up CXR in AM. Hopefully can remove chest tube in AM. Per pt report, this is her first PTX on the L so pleurodesis probably not indicated. Will defer to TCTS on this matter    Billy Fischer, MD;  PCCM service; Mobile [phone]

## 2011-09-11 NOTE — Progress Notes (Signed)
eLink Physician-Brief Progress Note Patient Name: Rhonda Ramos DOB: 13-Nov-1965 MRN: [id number]  Date of Service  09/11/2011   HPI/Events of Note  RN calling and says patient asking specifically for imodium   eICU Interventions  Prn dosing given   Intervention Category Intermediate Interventions: Other:  [name] 09/11/2011, 3:33 PM

## 2011-09-11 NOTE — Progress Notes (Addendum)
[address]            [place] [address]          [phone]          Subjective: C/O pain from chest tube  Objective    Temp:  [98 F (36.7 C)-99 F (37.2 C)] 98.8 F (37.1 C) (05/09 0654) Pulse Rate:  [74-102] 81  (05/09 0654) Resp:  [13-25] 18  (05/09 0654) BP: (90-118)/(59-100) 116/84 mmHg (05/09 0654) SpO2:  [92 %-100 %] 96 % (05/09 0654) Weight:  [103 lb 9.9 oz (47 kg)-107 lb 5.8 oz (48.7 kg)] 107 lb 5.8 oz (48.7 kg) (05/09 0654)   Intake/Output Summary (Last 24 hours) at 09/11/11 0821 Last data filed at 09/11/11 0758  Gross per 24 hour  Intake 1157.5 ml  Output    860 ml  Net  297.5 ml       General appearance: alert, cooperative and no distress Heart: regular rate and rhythm Lungs: mildly diminished in bases  Lab Results:  [facility] 09/11/11 0450 09/10/11 0434  NA 137 140  K 3.9 4.4  CL 107 107  CO2 19 25  GLUCOSE 88 139*  BUN 9 6  CREATININE 0.71 0.87  CALCIUM 9.4 10.0  MG 1.7 --  PHOS 3.3 --    Basename 09/08/11 2248  AST 26  ALT 25  ALKPHOS 113  BILITOT 0.3  PROT 8.4*  ALBUMIN 4.2   No results found for this basename: LIPASE:2,AMYLASE:2 in the last 72 hours  Basename 09/10/11 0434 09/09/11 0428 09/08/11 2248  WBC 8.6 13.3* --  NEUTROABS 5.5 -- 3.6  HGB 12.0 11.6* --  HCT 36.1 34.7* --  MCV 89.8 88.1 --  PLT 194 223 --    Basename 09/08/11 2248  CKTOTAL --  CKMB --  TROPONINI <0.30   No components found with this basename: POCBNP:3 No results found for this basename: DDIMER in the last 72 hours No results found for this basename: HGBA1C in the last 72 hours No results found for this basename: CHOL,HDL,LDLCALC,TRIG,CHOLHDL in the last 72 hours No results found for this basename: TSH,T4TOTAL,FREET3,T3FREE,THYROIDAB in the last 72 hours No results found for this basename: VITAMINB12,FOLATE,FERRITIN,TIBC,IRON,RETICCTPCT in the last 72 hours  Medications: Scheduled    . fentaNYL  50 mcg  Transdermal Q72H  . FLUoxetine  60 mg Oral Daily  . heparin  5,000 Units Subcutaneous Q8H  . levETIRAcetam  250 mg Oral Daily  . mulitivitamin with minerals  1 tablet Oral Daily  . oxybutynin  10 mg Oral Daily  . pantoprazole  40 mg Oral Q1200  . piperacillin-tazobactam (ZOSYN)  IV  3.375 g Intravenous Q8H  . vancomycin  500 mg Intravenous Q12H  . DISCONTD: fentaNYL  50 mcg Transdermal Q72H     Radiology/Studies:  Dg Chest Port 1 View  09/11/2011  *RADIOLOGY REPORT*  Clinical Data: Left chest tube, shortness of breath.  PORTABLE CHEST - 1 VIEW  Comparison: 09/10/2011.  Findings: Trachea is midline.  Heart size is grossly stable.  Left chest tube is in place.  No definite pneumothorax.  Subcutaneous emphysema is seen along the left neck and left chest wall, but has improved slightly in the interval.  Lungs are very low in volume with bibasilar air space disease, as before.  IMPRESSION:  1.  No definite pneumothorax with left chest tube in place. Resolving subcutaneous emphysema. 2.  Low lung volumes with bibasilar air space disease, as before.  Original Report Authenticated By: Reyes Ivan, M.D.   Dg Chest Port 1 View  09/10/2011  *RADIOLOGY REPORT*  Clinical Data: Follow up pneumothorax, evaluate chest.  PORTABLE CHEST - 1 VIEW  Comparison: Portable chest x-ray of 09/08/2011  Findings: The left chest tube remains extending to the left lung apex with the tip medially positioned.  No definite pneumothorax is seen.  Basilar opacities have improved slightly consistent with residual atelectasis left greater than right.  Mild cardiomegaly is stable.  Left chest wall subcutaneous air again is noted.  IMPRESSION: No change in medially positioned left apical chest tube with no definite pneumothorax.  Slightly better aeration.  Original Report Authenticated By: Juline Patch, M.D.   Chest tube 1-2 air leakL chess  INR: Will add last result for INR, ABG once components are confirmed Will add last 4  CBG results once components are confirmed  Assessment/Plan: S/P left chest tube   Cont suction- change to  10 cm H2O   LOS: 3 days    GOLD,WAYNE E 5/9/20138:21 AM    I have seen and examined the patient and agree with the assessment and plan as outlined.    Because of the fairly large air leak noted initially I would leave tube in place at least 48 hours on water seal before considering removal.  I would not remove tomorrow under any circumstances.  Harlow Carrizales H 09/11/2011 4:36 PM

## 2011-09-12 ENCOUNTER — Ambulatory Visit: Payer: Medicare Other | Admitting: Internal Medicine

## 2011-09-12 ENCOUNTER — Inpatient Hospital Stay (HOSPITAL_COMMUNITY): Payer: Medicare Other

## 2011-09-12 DIAGNOSIS — J159 Unspecified bacterial pneumonia: Secondary | ICD-10-CM

## 2011-09-12 DIAGNOSIS — G8921 Chronic pain due to trauma: Secondary | ICD-10-CM

## 2011-09-12 DIAGNOSIS — F329 Major depressive disorder, single episode, unspecified: Secondary | ICD-10-CM

## 2011-09-12 DIAGNOSIS — J930 Spontaneous tension pneumothorax: Secondary | ICD-10-CM

## 2011-09-12 LAB — CBC
Hemoglobin: 11.7 g/dL — ABNORMAL LOW (ref 12.0–15.0)
MCV: 91.5 fL (ref 78.0–100.0)
Platelets: 222 10*3/uL (ref 150–400)
RBC: 3.86 MIL/uL — ABNORMAL LOW (ref 3.87–5.11)
WBC: 7.1 10*3/uL (ref 4.0–10.5)

## 2011-09-12 LAB — BASIC METABOLIC PANEL
CO2: 25 mEq/L (ref 19–32)
Chloride: 106 mEq/L (ref 96–112)
Glucose, Bld: 102 mg/dL — ABNORMAL HIGH (ref 70–99)
Sodium: 141 mEq/L (ref 135–145)

## 2011-09-12 MED ORDER — KETOROLAC TROMETHAMINE 30 MG/ML IJ SOLN
30.0000 mg | Freq: Once | INTRAMUSCULAR | Status: AC
  Administered 2011-09-12: 30 mg via INTRAVENOUS
  Filled 2011-09-12: qty 1

## 2011-09-12 MED ORDER — LOPERAMIDE HCL 2 MG PO CAPS
4.0000 mg | ORAL_CAPSULE | Freq: Once | ORAL | Status: AC
  Administered 2011-09-12: 4 mg via ORAL

## 2011-09-12 NOTE — Progress Notes (Signed)
[facility] PROGRESS NOTE  Rhonda Ramos AVW:[id number] DOB: March 18, 1966 DOA: 09/08/2011   Assessment/Plan: Patient Active Hospital Problem List: Tension pneumothorax (09/09/2011) -5/6 L chest tube. -cont CT per CVTS,  -CXR: residual pneumothorax seen along the left heart border  -pain management. -? 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  Seizure (08/28/2010) -home keppra  Pneumonia (08/11/2011) - Avelox 5/9 -afebrile, no leukocytosis.   Code Status: Full code Family Communication: Mother [phone] Disposition Plan: home  Lambert Keto, MD  [facility] Pager [phone]  If 7PM-7AM, please contact night-coverage [email or web address] Password [email or web address] 09/12/2011, 2:05 PM   LOS: 4 days   Lines / Drains:  5/6 L chest tube (perCVTS) >>   Cultures / Sepsis markers:  5/7 blood cx >>   Antibiotics:  5/7 vanc (HCAP) >> 5/9  5/7 zosyn (HCAP) >> 5/9  Avelox 5/9 >>    Subjective: Left sided chest pain  Objective: Filed Vitals:   09/11/11 1400 09/11/11 1602 09/11/11 2100 09/12/11 0500  BP: 97/64 100/64 105/71 108/79  Pulse: 82  93 98  Temp: 98 F (36.7 C)  98 F (36.7 C) 99.3 F (37.4 C)  TempSrc: Oral  Oral Oral  Resp: 18  16 18   Height:      Weight:    48.5 kg (106 lb 14.8 oz)  SpO2: 94%  92% 96%   No intake or output data in the 24 hours ending 09/12/11 1405 Weight change: 1.5 kg (3 lb 4.9 oz)  Exam:  General: Alert, awake, oriented x3, in no acute distress.  HEENT: No bruits, no goiter.  Heart: Regular rate and rhythm, without murmurs, rubs, gallops.  Lungs: Good air movement, bilateral air movement. Chest tube on the left side. Abdomen: Soft, nontender, nondistended,  positive bowel sounds.  Neuro: Grossly intact, nonfocal.   Data Reviewed: Basic Metabolic Panel:  Lab 09/12/11 6295 09/11/11 0450 09/10/11 0434 09/09/11 0428 09/08/11 2248  NA 141 137 140 140 138  K 3.3* 3.9 -- -- --  CL 106 107 107 108 100  CO2 25 19 25 21 20   GLUCOSE 102* 88 139* 127* 134*  BUN 11 9 6 10 11   CREATININE 0.79 0.71 0.87 0.70 0.75  CALCIUM 10.0 9.4 10.0 9.0 10.6*  MG -- 1.7 -- -- --  PHOS -- 3.3 -- -- --   Liver Function Tests:  Lab 09/08/11 2248  AST 26  ALT 25  ALKPHOS 113  BILITOT 0.3  PROT 8.4*  ALBUMIN 4.2   No results found for this basename: LIPASE:5,AMYLASE:5 in the last 168 hours No results found for this basename: AMMONIA:5 in the last 168 hours CBC:  Lab 09/12/11 0550 09/10/11 0434 09/09/11 0428 09/08/11 2248  WBC 7.1 8.6 13.3* 9.6  NEUTROABS -- 5.5 -- 3.6  HGB 11.7* 12.0 11.6* 14.7  HCT 35.3* 36.1 34.7* 41.5  MCV 91.5 89.8 88.1 87.4  PLT 222 194 223 264   Cardiac Enzymes:  Lab 09/08/11 2248  CKTOTAL --  CKMB --  CKMBINDEX --  TROPONINI <0.30   BNP: No components found with this basename: POCBNP:5 CBG:  Lab 09/09/11 0246  GLUCAP 134*    Recent Results (from the past 240 hour(s))  CULTURE, BLOOD (ROUTINE X 2)     Status: Normal (Preliminary result)   Collection Time  09/09/11  2:10 AM      Component Value Range Status Comment   Specimen Description BLOOD LEFT HAND   Final    Special Requests BOTTLES DRAWN AEROBIC ONLY 5CC   Final    Culture  Setup Time [id number]   Final    Culture     Final    Value:        BLOOD CULTURE RECEIVED NO GROWTH TO DATE CULTURE WILL BE HELD FOR 5 DAYS BEFORE ISSUING A FINAL NEGATIVE REPORT   Report Status PENDING   Incomplete   CULTURE, BLOOD (ROUTINE X 2)     Status: Normal (Preliminary result)   Collection Time   09/09/11  2:10 AM      Component Value Range Status Comment   Specimen Description BLOOD LEFT ARM   Final    Special Requests BOTTLES DRAWN AEROBIC AND ANAEROBIC 10CC EA   Final      Culture  Setup Time [id number]   Final    Culture     Final    Value:        BLOOD CULTURE RECEIVED NO GROWTH TO DATE CULTURE WILL BE HELD FOR 5 DAYS BEFORE ISSUING A FINAL NEGATIVE REPORT   Report Status PENDING   Incomplete   MRSA PCR SCREENING     Status: Normal   Collection Time   09/09/11  2:35 AM      Component Value Range Status Comment   MRSA by PCR NEGATIVE  NEGATIVE  Final      Studies: Dg Chest 2 View  08/20/2011  *RADIOLOGY REPORT*  Clinical Data: Dyspnea  CHEST - 2 VIEW  Comparison: 08/17/2011  Findings: Removal of the right IJ central line.  Some interval improvement in bibasilar airspace disease/atelectasis.  Small right effusion remains.  No pneumothorax evident. Marked scoliosis of the spine.  IMPRESSION: Improving bibasilar airspace disease/atelectasis.  Persistent small right effusion.  Original Report Authenticated By: Judie Petit. Ruel Favors, M.D.   Ct Chest Wo Contrast  08/14/2011  *RADIOLOGY REPORT*  Clinical Data: History of right pleural effusion.  Question loculation.  Pneumothorax.  CT CHEST WITHOUT CONTRAST  Technique:  Multidetector CT imaging of the chest was performed following the standard protocol without IV contrast.  Comparison: Multiple prior chest radiographs and CT chest 08/06/2011.  Findings: Mediastinal lymph nodes are not enlarged by CT size criteria.  Hilar regions are difficult to definitively evaluate without IV contrast.  No axillary adenopathy.  Endotracheal tube is in satisfactory position.  Heart size normal.  No pericardial effusion.  There is a partially loculated small to moderate right pleural effusion with collapse/consolidation in the right middle and right lower lobes.  Postoperative changes are seen in the medial aspect of the lower right hemithorax, adjacent to focal areas of lucency (images 14 to 26).  Lucencies correspond to blebs and pleural air on 08/06/2011.  Patchy ground-glass is seen diffusely.  Blebs and small left pneumothorax in the left  hemithorax.  Airspace consolidation in the lingula and left lower lobe.  Incidental imaging of the upper abdomen shows no acute findings. No worrisome lytic or sclerotic lesions.  IMPRESSION:  1.  New partially loculated small to moderate right pleural effusion, worrisome for empyema. 2.  Complete or near complete resolution of right pneumothorax, with a small amount of pleural air and/or blebs in the medial aspect of the lower right hemithorax, as before. 3.  Multilobar collapse/consolidation and ground-glass, most indicative of pneumonia. 4.  Small left pneumothorax.  Original Report  Authenticated By: Reyes Ivan, M.D.   Dg Chest Portable 1 View  09/12/2011  *RADIOLOGY REPORT*  Clinical Data: Follow-up left pneumothorax.  PORTABLE CHEST - 1 VIEW  Comparison: 09/11/2011  Findings: Low lung volumes are again demonstrated with persistent bibasilar subsegmental atelectasis.  Left chest tube remains in place, and a tiny residual pneumothorax is seen along the left heart border.  Subcutaneous emphysema again seen in the left chest wall soft tissues.  Heart size remains normal.  IMPRESSION:  1.  Tiny residual pneumothorax seen along the left heart border. 2.  No significant change in bilateral subsegmental atelectasis.  Original Report Authenticated By: Danae Orleans, M.D.   Dg Chest Port 1 View  09/11/2011  *RADIOLOGY REPORT*  Clinical Data: Left chest tube, shortness of breath.  PORTABLE CHEST - 1 VIEW  Comparison: 09/10/2011.  Findings: Trachea is midline.  Heart size is grossly stable.  Left chest tube is in place.  No definite pneumothorax.  Subcutaneous emphysema is seen along the left neck and left chest wall, but has improved slightly in the interval.  Lungs are very low in volume with bibasilar air space disease, as before.  IMPRESSION:  1.  No definite pneumothorax with left chest tube in place. Resolving subcutaneous emphysema. 2.  Low lung volumes with bibasilar air space disease, as before.   Original Report Authenticated By: Reyes Ivan, M.D.   Dg Chest Port 1 View  09/10/2011  *RADIOLOGY REPORT*  Clinical Data: Follow up pneumothorax, evaluate chest.  PORTABLE CHEST - 1 VIEW  Comparison: Portable chest x-ray of 09/08/2011  Findings: The left chest tube remains extending to the left lung apex with the tip medially positioned.  No definite pneumothorax is seen.  Basilar opacities have improved slightly consistent with residual atelectasis left greater than right.  Mild cardiomegaly is stable.  Left chest wall subcutaneous air again is noted.  IMPRESSION: No change in medially positioned left apical chest tube with no definite pneumothorax.  Slightly better aeration.  Original Report Authenticated By: Juline Patch, M.D.   Dg Chest Portable 1 View  09/09/2011  *RADIOLOGY REPORT*  Clinical Data: Status post left-sided chest tube placement for pneumothorax; respiratory distress.  PORTABLE CHEST - 1 VIEW  Comparison: Chest radiograph performed earlier today at 11:25 p.m.  Findings: The lungs are significantly hypoexpanded.  There has been interval placement of a left apical chest tube, with apparent re- expansion of the left lung.  No definite pneumothorax is now seen. However, the lungs are not well assessed due to the degree of hypoexpansion.  Bilateral airspace opacities could reflect atelectasis or pneumonia, on comparison with prior CT from 08/14/2011.  The unusual pattern of air overlying the mediastinum reflects underlying blebs.  The mediastinum is not well assessed due to adjacent consolidation. No acute osseous abnormalities are identified.  IMPRESSION:  1.  No definite evidence of pneumothorax, status post placement of left apical chest tube. 2.  The lungs are markedly hypoexpanded, with dense bilateral airspace opacities, more prominent on the left.  This could reflect atelectasis or pneumonia, on correlation with prior CT; the unusual pattern of air overlying the mediastinum reflects  underlying blebs.  Original Report Authenticated By: Tonia Ghent, M.D.   Dg Chest Portable 1 View  09/08/2011  *RADIOLOGY REPORT*  Clinical Data: Decompression of left pneumothorax.  PORTABLE CHEST - 1 VIEW  Comparison: Earlier the same day  Findings: 2325 hours.  Marked thoracolumbar scoliosis distorts the thoracic and cardiomediastinal anatomy.  There is  an apparent angiocath or small sheath no superimposed on the upper left chest wall.  The volume of gas within the left pleural space has decreased in the interval and there is some re-expansion of the left lung.  The left pneumothorax remains large, but there does appear to be less rightward shift of cardiomediastinal structures compared to the previous film.  There is some persistent to probable atelectasis at the right base. Cardiopericardial silhouette may be enlarged, but the distortion by scoliosis makes assessment difficult. Telemetry leads overlie the chest.  IMPRESSION: Interval evacuation of some of the left pleural air with slightly less rightward shift of cardiomediastinal structures although the marked thoracolumbar scoliosis hinders this assessment.  Right base atelectasis.  Original Report Authenticated By: ERIC A. MANSELL, M.D.   Dg Chest Portable 1 View  09/08/2011  *RADIOLOGY REPORT*  Clinical Data: Respiratory distress.  Shortness of breath.  PORTABLE CHEST - 1 VIEW  Comparison: Chest radiograph performed 08/22/2011  Findings: There is complete collapse of the left lung, with rightward shift of the mediastinum, compatible with a tension pneumothorax.  Right basilar airspace opacification likely reflects residual atelectasis.  No definite pleural effusion is identified.  The cardiomediastinal silhouette is difficult to fully assess due to overlying pneumothorax lucency.  No acute osseous abnormalities are identified.  IMPRESSION:  1.  Complete collapse of the left lung, with rightward shift of the mediastinum, compatible with a tension  pneumothorax. 2.  Right basilar airspace opacity likely reflects residual atelectasis.  Critical Value/emergent results were called by telephone at the time of interpretation on 09/08/2011  at 10:58 p.m.  to  Dr. Marliss Czar- Ned Card, who verbally acknowledged these results.  Original Report Authenticated By: Tonia Ghent, M.D.   Dg Chest Port 1 View  08/22/2011  *RADIOLOGY REPORT*  Clinical Data: 46 year old female status post right thoracentesis. Pneumothorax.  PORTABLE CHEST - 1 VIEW  Comparison: 08/20/2011 and earlier.  Findings: Portable upright AP view 0730 hours.  Severe scoliosis. Stable lung volumes.  Mildly improved ventilation at the right lung base with residual confluent probable atelectasis.  No pneumothorax or edema.  Small right pleural effusion.  Stable cardiac size and mediastinal contours.  Visualized tracheal air column is within normal limits.  IMPRESSION: Mildly improved right lung base ventilation.  Residual atelectasis and small effusion.  Original Report Authenticated By: Harley Hallmark, M.D.   Dg Chest Port 1 View  08/17/2011  *RADIOLOGY REPORT*  Clinical Data: ARDS and pleural effusion. Follow-up pneumothorax.  PORTABLE CHEST - 1 VIEW  Comparison: 08/16/2011  Findings: This is a low-volume film with moderate bibasilar atelectasis. A right pleural effusion is again identified. A right IJ central venous catheter with tip overlying the mid SVC again noted. There is no evidence of pneumothorax. There is been little interval change since prior study.  IMPRESSION: Stable chest radiograph.  No evidence of pneumothorax.  Original Report Authenticated By: Rosendo Gros, M.D.   Dg Chest Port 1 View  08/16/2011  *RADIOLOGY REPORT*  Clinical Data: Post right thoracentesis  PORTABLE CHEST - 1 VIEW  Comparison: Chest radiograph 08/16/2011  Findings: Right central venous line is unchanged.  Stable cardiac silhouette.  There is a modest decrease in right pleural effusion. There are low lung volumes.   No pneumothorax appreciated.  Small left effusion and low lung volumes are stable.  IMPRESSION:  1.  No evidence of pneumothorax following right thoracentesis.  2.  Mild  decrease in pleural effusion.  Original Report Authenticated By: Genevive Bi, M.D.  Dg Chest Port 1 View  08/16/2011  *RADIOLOGY REPORT*  Clinical Data: Thoracentesis.  PORTABLE CHEST - 1 VIEW  Comparison: 08/16/2011  Findings: Right IJ line tip projects over the SVC.  Very low lung volumes are again noted.  No pneumothorax observed; the previous left apical pneumothorax is no longer readily visualized.  Large right pleural effusion may be mildly reduced in size compared to the prior exam but remains large, with associated passive atelectasis.  There is atelectasis at the left lung base as well.  Cardiac margins are obscured.  IMPRESSION:  1.  Potentially reduced right pleural effusion, although a large pleural effusion persist.  There is associated passive atelectasis. No pneumothorax. 2.  Stable atelectasis at the left lung base.  Original Report Authenticated By: Dellia Cloud, M.D.   Dg Chest Port 1 View  08/16/2011  *RADIOLOGY REPORT*  Clinical Data: Lung disease. Recently intubated patient.  PORTABLE CHEST - 1 VIEW  Comparison:   08/15/2011 and 08/14/2011.  Findings: 0530 hours. There has been apparent interval extubation and removal of the nasogastric tube.  Right IJ central venous catheter is unchanged in the mid SVC.  There are right greater than left pleural effusions with associated bibasilar air space opacities.  The right pleural effusion and basilar pulmonary opacity appear worse. Previously noted left pneumothorax is not well visualized but may have a minimal residual apical component. Heart size and mediastinal contours are stable.  There is a severe convex left thoracolumbar scoliosis.  IMPRESSION: Worsening right pleural effusion and basilar air space disease following extubation. Probable minimal residual left  apical pneumothorax.  Original Report Authenticated By: Gerrianne Scale, M.D.   Dg Chest Port 1 View  08/15/2011  *RADIOLOGY REPORT*  Clinical Data: 46 year old female with ventilation dependent respiratory failure.  Sepsis.  PORTABLE CHEST - 1 VIEW  Comparison: 08/14/2011 and earlier.  Findings: AP portable semi upright view 0601 hours.  Endotracheal tube tip projects between level of the clavicles and carina. Enteric tube is looped in the over the gastric air.  Stable right IJ central line.  Continued lucency in the left hemithorax suspicious for pneumothorax, likely stable.  Right lung base opacity is stable.  Stable cardiac size and mediastinal contours.  IMPRESSION: 1. Stable lines and tubes. 2.  Persistent left pneumothorax suspected and probably stable in extent. 3. Stable right lung pneumonia with some superimposed pleural fluid.  Original Report Authenticated By: Harley Hallmark, M.D.   Dg Chest Port 1 View  08/15/2011  *RADIOLOGY REPORT*  Clinical Data: Evaluate left-sided pneumothorax.  PORTABLE CHEST - 1 VIEW  Comparison: 08/14/2011 CT and 08/13/2011 chest x-ray.  Findings: The CT detected left pneumothorax is not as well delineated on the present plain film exam.  The subtle lucency in the left costophrenic angle region may reflect the CT detected pneumothorax.  Bullae/bleb superior medial aspect left lung limiting evaluation in this region for detection of small pneumothorax.  Right base consolidation with loculated hydropneumothorax.  Right central line tip proximal superior vena cava.  Nasogastric tube courses below the diaphragm.  The tip is not included on this exam.  Endotracheal tube tip 7.2 cm above the carina.  IMPRESSION: CT detected left-sided pneumothorax and right-sided hydropneumothorax better demonstrated on the CT and poorly delineated by plain film exam.  Original Report Authenticated By: Fuller Canada, M.D.   Dg Abd Portable 1v  08/14/2011  *RADIOLOGY REPORT*  Clinical  Data: Evaluate for possible ileus.  PORTABLE ABDOMEN - 1 VIEW  Comparison:  Abdominal radiograph 08/11/2011.  Findings: A nasogastric tube is seen coiled within the stomach. There is some gas and stool seen scattered throughout the colon extending to the level of the distal rectum.  No pathologic distension of small bowel is appreciated.  There is a paucity of bowel gas throughout the right side of the abdomen.  IMPRESSION: 1.  Nonspecific bowel gas pattern, as above, not consistent with underlying ileus.  Original Report Authenticated By: Florencia Reasons, M.D.   US Thoracentesis Asp Pleural Space W/img Guide  08/16/2011  *RADIOLOGY REPORT*  Clinical Data:  Right pleural effusion.  Request has been made for diagnostic right-sided thoracentesis.  ULTRASOUND GUIDED right THORACENTESIS  Comparison:  Prior CT imaging of the chest.  An ultrasound guided thoracentesis was thoroughly discussed with the patient and questions answered.  The benefits, risks, alternatives and complications were also discussed.  The patient understands and wishes to proceed with the procedure.  Written consent was obtained.  Ultrasound was performed to localize and mark an adequate pocket of fluid in the right chest.  The area was then prepped and draped in the normal sterile fashion.  1% Lidocaine was used for local anesthesia.  Under ultrasound guidance a 19 gauge Yueh catheter was introduced.  Thoracentesis was performed.  The catheter was removed and a dressing applied.  Complications:  None immediate  Findings: A total of approximately 90 ml of bloody serous fluid was removed. A fluid sample was sent for laboratory analysis.  IMPRESSION: Successful ultrasound guided right thoracentesis yielding 90 ml of pleural fluid.  Read by: Anselm Pancoast, P.A.-C  Original Report Authenticated By: Richarda Overlie, M.D.    Scheduled Meds:    . fentaNYL  50 mcg Transdermal Q72H  . FLUoxetine  60 mg Oral Daily  . heparin  5,000 Units  Subcutaneous Q8H  . ketorolac  30 mg Intravenous Once  . levETIRAcetam  250 mg Oral Daily  . loperamide  4 mg Oral Once  . moxifloxacin  400 mg Oral q1800  . mulitivitamin with minerals  1 tablet Oral Daily  . oxybutynin  10 mg Oral Daily   Continuous Infusions:

## 2011-09-12 NOTE — Progress Notes (Addendum)
[address]            [place] [address]          [phone]          Subjective: "having a bad day"  Objective  Telemetry SR   Temp:  [98 F (36.7 C)-99.3 F (37.4 C)] 99.3 F (37.4 C) (05/10 0500) Pulse Rate:  [82-98] 98  (05/10 0500) Resp:  [16-18] 18  (05/10 0500) BP: (97-108)/(64-79) 108/79 mmHg (05/10 0500) SpO2:  [92 %-96 %] 96 % (05/10 0500) Weight:  [106 lb 14.8 oz (48.5 kg)] 106 lb 14.8 oz (48.5 kg) (05/10 0500)  No intake or output data in the 24 hours ending 09/12/11 0818     General appearance: alert, cooperative and no distress Heart: regular rate and rhythm Lungs: mildly diminished in bases  Lab Results:  Basename 09/12/11 0550 09/11/11 0450  NA 141 137  K 3.3* 3.9  CL 106 107  CO2 25 19  GLUCOSE 102* 88  BUN 11 9  CREATININE 0.79 0.71  CALCIUM 10.0 9.4  MG -- 1.7  PHOS -- 3.3   No results found for this basename: AST:2,ALT:2,ALKPHOS:2,BILITOT:2,PROT:2,ALBUMIN:2 in the last 72 hours No results found for this basename: LIPASE:2,AMYLASE:2 in the last 72 hours  Basename 09/12/11 0550 09/10/11 0434  WBC 7.1 8.6  NEUTROABS -- 5.5  HGB 11.7* 12.0  HCT 35.3* 36.1  MCV 91.5 89.8  PLT 222 194   No results found for this basename: CKTOTAL:4,CKMB:4,TROPONINI:4 in the last 72 hours No components found with this basename: POCBNP:3 No results found for this basename: DDIMER in the last 72 hours No results found for this basename: HGBA1C in the last 72 hours No results found for this basename: CHOL,HDL,LDLCALC,TRIG,CHOLHDL in the last 72 hours No results found for this basename: TSH,T4TOTAL,FREET3,T3FREE,THYROIDAB in the last 72 hours No results found for this basename: VITAMINB12,FOLATE,FERRITIN,TIBC,IRON,RETICCTPCT in the last 72 hours  Medications: Scheduled    . fentaNYL  50 mcg Transdermal Q72H  . FLUoxetine  60 mg Oral Daily  . heparin  5,000 Units Subcutaneous Q8H  . ketorolac  30 mg Intravenous Once   . levETIRAcetam  250 mg Oral Daily  . moxifloxacin  400 mg Oral q1800  . mulitivitamin with minerals  1 tablet Oral Daily  . oxybutynin  10 mg Oral Daily  . DISCONTD: pantoprazole  40 mg Oral Q1200  . DISCONTD: piperacillin-tazobactam (ZOSYN)  IV  3.375 g Intravenous Q8H  . DISCONTD: vancomycin  500 mg Intravenous Q12H     Radiology/Studies:  Dg Chest Portable 1 View  09/12/2011  *RADIOLOGY REPORT*  Clinical Data: Follow-up left pneumothorax.  PORTABLE CHEST - 1 VIEW  Comparison: 09/11/2011  Findings: Low lung volumes are again demonstrated with persistent bibasilar subsegmental atelectasis.  Left chest tube remains in place, and a tiny residual pneumothorax is seen along the left heart border.  Subcutaneous emphysema again seen in the left chest wall soft tissues.  Heart size remains normal.  IMPRESSION:  1.  Tiny residual pneumothorax seen along the left heart border. 2.  No significant change in bilateral subsegmental atelectasis.  Original Report Authenticated By: Danae Orleans, M.D.   Dg Chest Port 1 View  09/11/2011  *RADIOLOGY REPORT*  Clinical Data: Left chest tube, shortness of breath.  PORTABLE CHEST - 1 VIEW  Comparison: 09/10/2011.  Findings: Trachea is midline.  Heart size is  grossly stable.  Left chest tube is in place.  No definite pneumothorax.  Subcutaneous emphysema is seen along the left neck and left chest wall, but has improved slightly in the interval.  Lungs are very low in volume with bibasilar air space disease, as before.  IMPRESSION:  1.  No definite pneumothorax with left chest tube in place. Resolving subcutaneous emphysema. 2.  Low lung volumes with bibasilar air space disease, as before.  Original Report Authenticated By: Reyes Ivan, M.D.    INR: Will add last result for INR, ABG once components are confirmed Will add last 4 CBG results once components are confirmed  Assessment/Plan: S/P L chest tube for PNTX No air leak currently, cont to H2O seal until  tomorrow   Push pulm toilet as able   LOS: 4 days    Keslee Harrington E 5/10/20138:18 AM    I have seen and examined the patient and agree with the assessment and plan as outlined.  OWEN,CLARENCE H 09/12/2011 1:54 PM

## 2011-09-13 ENCOUNTER — Inpatient Hospital Stay (HOSPITAL_COMMUNITY): Payer: Medicare Other

## 2011-09-13 DIAGNOSIS — J930 Spontaneous tension pneumothorax: Secondary | ICD-10-CM

## 2011-09-13 DIAGNOSIS — G8921 Chronic pain due to trauma: Secondary | ICD-10-CM

## 2011-09-13 DIAGNOSIS — J159 Unspecified bacterial pneumonia: Secondary | ICD-10-CM

## 2011-09-13 DIAGNOSIS — F329 Major depressive disorder, single episode, unspecified: Secondary | ICD-10-CM

## 2011-09-13 MED ORDER — CLONAZEPAM 0.5 MG PO TABS
0.5000 mg | ORAL_TABLET | Freq: Once | ORAL | Status: AC
  Administered 2011-09-14: 0.5 mg via ORAL
  Filled 2011-09-13: qty 1

## 2011-09-13 MED ORDER — NAPHAZOLINE HCL 0.1 % OP SOLN
1.0000 [drp] | Freq: Four times a day (QID) | OPHTHALMIC | Status: DC | PRN
  Filled 2011-09-13: qty 15

## 2011-09-13 MED ORDER — FENTANYL CITRATE 0.05 MG/ML IJ SOLN
100.0000 ug | INTRAMUSCULAR | Status: DC | PRN
  Administered 2011-09-13 – 2011-09-16 (×25): 100 ug via INTRAVENOUS
  Filled 2011-09-13 (×25): qty 2

## 2011-09-13 MED ORDER — FENTANYL CITRATE 0.05 MG/ML IJ SOLN
50.0000 ug | INTRAMUSCULAR | Status: DC | PRN
  Administered 2011-09-13: 50 ug via INTRAVENOUS
  Filled 2011-09-13: qty 2

## 2011-09-13 MED ORDER — POTASSIUM CHLORIDE CRYS ER 20 MEQ PO TBCR
40.0000 meq | EXTENDED_RELEASE_TABLET | Freq: Once | ORAL | Status: AC
  Administered 2011-09-13: 40 meq via ORAL
  Filled 2011-09-13: qty 2

## 2011-09-13 MED ORDER — SIMETHICONE 80 MG PO CHEW
80.0000 mg | CHEWABLE_TABLET | Freq: Four times a day (QID) | ORAL | Status: DC
  Administered 2011-09-13 – 2011-09-21 (×25): 80 mg via ORAL
  Filled 2011-09-13 (×36): qty 1

## 2011-09-13 MED ORDER — NAPHAZOLINE-GLYCERIN 0.012-0.2 % OP SOLN
1.0000 [drp] | Freq: Four times a day (QID) | OPHTHALMIC | Status: DC | PRN
Start: 2011-09-13 — End: 2011-09-21
  Filled 2011-09-13: qty 15

## 2011-09-13 MED ORDER — FENTANYL 75 MCG/HR TD PT72
75.0000 ug | MEDICATED_PATCH | TRANSDERMAL | Status: DC
  Administered 2011-09-15: 75 ug via TRANSDERMAL
  Filled 2011-09-13 (×2): qty 1
  Filled 2011-09-13: qty 3

## 2011-09-13 NOTE — Progress Notes (Addendum)
[address]            [place] [address]          [phone]          Subjective: Feels ok  Objective    Temp:  [98.5 F (36.9 C)-99.1 F (37.3 C)] 98.9 F (37.2 C) (05/11 0542) Pulse Rate:  [80-105] 80  (05/11 0542) Resp:  [16-19] 19  (05/11 0542) BP: (110-117)/(74-80) 117/80 mmHg (05/11 0542) SpO2:  [95 %-96 %] 96 % (05/11 0542) Weight:  [107 lb 2.3 oz (48.6 kg)] 107 lb 2.3 oz (48.6 kg) (05/11 0542)   Intake/Output Summary (Last 24 hours) at 09/13/11 1029 Last data filed at 09/13/11 0500  Gross per 24 hour  Intake    240 ml  Output    511 ml  Net   -271 ml       General appearance: alert, cooperative and no distress Heart: regular rate and rhythm and S1, S2 normal Lungs: diminished in bases  Lab Results:  Basename 09/12/11 0550 09/11/11 0450  NA 141 137  K 3.3* 3.9  CL 106 107  CO2 25 19  GLUCOSE 102* 88  BUN 11 9  CREATININE 0.79 0.71  CALCIUM 10.0 9.4  MG -- 1.7  PHOS -- 3.3   No results found for this basename: AST:2,ALT:2,ALKPHOS:2,BILITOT:2,PROT:2,ALBUMIN:2 in the last 72 hours No results found for this basename: LIPASE:2,AMYLASE:2 in the last 72 hours  Basename 09/12/11 0550  WBC 7.1  NEUTROABS --  HGB 11.7*  HCT 35.3*  MCV 91.5  PLT 222   No results found for this basename: CKTOTAL:4,CKMB:4,TROPONINI:4 in the last 72 hours No components found with this basename: POCBNP:3 No results found for this basename: DDIMER in the last 72 hours No results found for this basename: HGBA1C in the last 72 hours No results found for this basename: CHOL,HDL,LDLCALC,TRIG,CHOLHDL in the last 72 hours No results found for this basename: TSH,T4TOTAL,FREET3,T3FREE,THYROIDAB in the last 72 hours No results found for this basename: VITAMINB12,FOLATE,FERRITIN,TIBC,IRON,RETICCTPCT in the last 72 hours  Medications: Scheduled    . fentaNYL  50 mcg Transdermal Q72H  . FLUoxetine  60 mg Oral Daily  . heparin  5,000 Units  Subcutaneous Q8H  . levETIRAcetam  250 mg Oral Daily  . moxifloxacin  400 mg Oral q1800  . mulitivitamin with minerals  1 tablet Oral Daily  . oxybutynin  10 mg Oral Daily     Radiology/Studies:  Dg Chest Portable 1 View  09/12/2011  *RADIOLOGY REPORT*  Clinical Data: Follow-up left pneumothorax.  PORTABLE CHEST - 1 VIEW  Comparison: 09/11/2011  Findings: Low lung volumes are again demonstrated with persistent bibasilar subsegmental atelectasis.  Left chest tube remains in place, and a tiny residual pneumothorax is seen along the left heart border.  Subcutaneous emphysema again seen in the left chest wall soft tissues.  Heart size remains normal.  IMPRESSION:  1.  Tiny residual pneumothorax seen along the left heart border. 2.  No significant change in bilateral subsegmental atelectasis.  Original Report Authenticated By: Danae Orleans, M.D.   Chest tube- Now with large air leak  INR: Will add last result for INR, ABG once components are confirmed Will add last 4 CBG results once components are confirmed  Assessment/Plan: S/P Left chest tube for spont pntx Now with large air leak on H2O seal Will check pcxr Leave tube in place  LOS: 5 days    GOLD,WAYNE E 5/11/201310:29 AM    Chart reviewed, patient examined, agree with above. She has a large air leak today. CT of chest last month shows large bullae on the left.  I suspect she will need bleb stapling.

## 2011-09-13 NOTE — Progress Notes (Signed)
[facility] PROGRESS NOTE  Rhonda Ramos JXB:[id number] DOB: 04/20/1966 DOA: 09/08/2011   Assessment/Plan: Patient Active Hospital Problem List: Tension pneumothorax (09/09/2011) -5/6 L chest tube. -cont CT per CVTS,  -CXR: residual pneumothorax seen along the left heart border  -pain management. - will increase fentanyl patch and iv push  - ? 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 Stop abx as there is no convincing evidence for infection on cxr Total days on abx 5  Code Status: Full code Family Communication: Mother [phone] Disposition Plan: home  Rhonda Ramos [place], [place]  [facility] Pager [phone]  If 7PM-7AM, please contact night-coverage [email or web address] Password [email or web address] 09/13/2011, 10:38 AM   LOS: 5 days   Lines / Drains:  5/6 L chest tube (perCVTS) >>   Cultures / Sepsis markers:  5/7 blood cx >>   Antibiotics:  5/7 vanc (HCAP) >> 5/9  5/7 zosyn (HCAP) >> 5/9  Avelox 5/9 >> 5/11   Subjective: Left sided chest pain  Objective: Filed Vitals:   09/12/11 0500 09/12/11 1500 09/12/11 2227 09/13/11 0542  BP: 108/79 113/74 110/76 117/80  Pulse: 98 105 91 80  Temp: 99.3 F (37.4 C) 98.5 F (36.9 C) 99.1 F (37.3 C) 98.9 F (37.2 C)  TempSrc: Oral  Oral Oral  Resp: 18 17 16 19   Height:      Weight: 48.5 kg (106 lb 14.8 oz)   48.6 kg (107 lb 2.3 oz)  SpO2: 96% 95% 96% 96%    Intake/Output Summary (Last 24 hours) at 09/13/11 1038 Last data filed at 09/13/11 0500  Gross per 24 hour  Intake    240 ml  Output    511 ml  Net   -271 ml   Weight change: 0.1 kg (3.5 oz)  Exam:  General:  Alert, awake, oriented x3, in no acute distress.  HEENT: No bruits, no goiter.  Heart: Regular rate and rhythm, without murmurs, rubs, gallops.  Lungs: Good air movement, bilateral air movement. Chest tube on the left side. Abdomen: Soft, nontender, nondistended, positive bowel sounds.  Neuro: Grossly intact, nonfocal.   Data Reviewed: Basic Metabolic Panel:  Lab 09/12/11 4132 09/11/11 0450 09/10/11 0434 09/09/11 0428 09/08/11 2248  NA 141 137 140 140 138  K 3.3* 3.9 -- -- --  CL 106 107 107 108 100  CO2 25 19 25 21 20   GLUCOSE 102* 88 139* 127* 134*  BUN 11 9 6 10 11   CREATININE 0.79 0.71 0.87 0.70 0.75  CALCIUM 10.0 9.4 10.0 9.0 10.6*  MG -- 1.7 -- -- --  PHOS -- 3.3 -- -- --   Liver Function Tests:  Lab 09/08/11 2248  AST 26  ALT 25  ALKPHOS 113  BILITOT 0.3  PROT 8.4*  ALBUMIN 4.2   No results found for this basename: LIPASE:5,AMYLASE:5 in the last 168 hours No results found for this basename: AMMONIA:5 in the last 168 hours CBC:  Lab 09/12/11 0550 09/10/11 0434 09/09/11 0428 09/08/11 2248  WBC 7.1 8.6 13.3* 9.6  NEUTROABS -- 5.5 -- 3.6  HGB 11.7* 12.0 11.6* 14.7  HCT 35.3* 36.1 34.7* 41.5  MCV 91.5 89.8 88.1 87.4  PLT 222 194 223 264  Cardiac Enzymes:  Lab 09/08/11 2248  CKTOTAL --  CKMB --  CKMBINDEX --  TROPONINI <0.30   BNP: No components found with this basename: POCBNP:5 CBG:  Lab 09/09/11 0246  GLUCAP 134*    Recent Results (from the past 240 hour(s))  CULTURE, BLOOD (ROUTINE X 2)     Status: Normal (Preliminary result)   Collection Time   09/09/11  2:10 AM      Component Value Range Status Comment   Specimen Description BLOOD LEFT HAND   Final    Special Requests BOTTLES DRAWN AEROBIC ONLY 5CC   Final    Culture  Setup Time [id number]   Final    Culture     Final    Value:        BLOOD CULTURE RECEIVED NO GROWTH TO DATE CULTURE WILL BE HELD FOR 5 DAYS BEFORE ISSUING A FINAL NEGATIVE REPORT   Report Status PENDING   Incomplete     CULTURE, BLOOD (ROUTINE X 2)     Status: Normal (Preliminary result)   Collection Time   09/09/11  2:10 AM      Component Value Range Status Comment   Specimen Description BLOOD LEFT ARM   Final    Special Requests BOTTLES DRAWN AEROBIC AND ANAEROBIC 10CC EA   Final    Culture  Setup Time [id number]   Final    Culture     Final    Value:        BLOOD CULTURE RECEIVED NO GROWTH TO DATE CULTURE WILL BE HELD FOR 5 DAYS BEFORE ISSUING A FINAL NEGATIVE REPORT   Report Status PENDING   Incomplete   MRSA PCR SCREENING     Status: Normal   Collection Time   09/09/11  2:35 AM      Component Value Range Status Comment   MRSA by PCR NEGATIVE  NEGATIVE  Final      Studies: Dg Chest 2 View  08/20/2011  *RADIOLOGY REPORT*  Clinical Data: Dyspnea  CHEST - 2 VIEW  Comparison: 08/17/2011  Findings: Removal of the right IJ central line.  Some interval improvement in bibasilar airspace disease/atelectasis.  Small right effusion remains.  No pneumothorax evident. Marked scoliosis of the spine.  IMPRESSION: Improving bibasilar airspace disease/atelectasis.  Persistent small right effusion.  Original Report Authenticated By: Rhonda Petit. Ruel Ramos, M.D.   Ct Chest Wo Contrast  08/14/2011  *RADIOLOGY REPORT*  Clinical Data: History of right pleural effusion.  Question loculation.  Pneumothorax.  CT CHEST WITHOUT CONTRAST  Technique:  Multidetector CT imaging of the chest was performed following the standard protocol without IV contrast.  Comparison: Multiple prior chest radiographs and CT chest 08/06/2011.  Findings: Mediastinal lymph nodes are not enlarged by CT size criteria.  Hilar regions are difficult to definitively evaluate without IV contrast.  No axillary adenopathy.  Endotracheal tube is in satisfactory position.  Heart size normal.  No pericardial effusion.  There is a partially loculated small to moderate right pleural effusion with collapse/consolidation in the right middle and right lower lobes.  Postoperative  changes are seen in the medial aspect of the lower right hemithorax, adjacent to focal areas of lucency (images 14 to 26).  Lucencies correspond to blebs and pleural air on 08/06/2011.  Patchy ground-glass is seen diffusely.  Blebs and small left pneumothorax in the left hemithorax.  Airspace consolidation in the lingula and left lower lobe.  Incidental imaging of the upper abdomen shows no acute findings. No worrisome lytic or sclerotic  lesions.  IMPRESSION:  1.  New partially loculated small to moderate right pleural effusion, worrisome for empyema. 2.  Complete or near complete resolution of right pneumothorax, with a small amount of pleural air and/or blebs in the medial aspect of the lower right hemithorax, as before. 3.  Multilobar collapse/consolidation and ground-glass, most indicative of pneumonia. 4.  Small left pneumothorax.  Original Report Authenticated By: Reyes Ivan, M.D.   Dg Chest Portable 1 View  09/12/2011  *RADIOLOGY REPORT*  Clinical Data: Follow-up left pneumothorax.  PORTABLE CHEST - 1 VIEW  Comparison: 09/11/2011  Findings: Low lung volumes are again demonstrated with persistent bibasilar subsegmental atelectasis.  Left chest tube remains in place, and a tiny residual pneumothorax is seen along the left heart border.  Subcutaneous emphysema again seen in the left chest wall soft tissues.  Heart size remains normal.  IMPRESSION:  1.  Tiny residual pneumothorax seen along the left heart border. 2.  No significant change in bilateral subsegmental atelectasis.  Original Report Authenticated By: Danae Orleans, M.D.   Dg Chest Port 1 View  09/11/2011  *RADIOLOGY REPORT*  Clinical Data: Left chest tube, shortness of breath.  PORTABLE CHEST - 1 VIEW  Comparison: 09/10/2011.  Findings: Trachea is midline.  Heart size is grossly stable.  Left chest tube is in place.  No definite pneumothorax.  Subcutaneous emphysema is seen along the left neck and left chest wall, but has improved slightly  in the interval.  Lungs are very low in volume with bibasilar air space disease, as before.  IMPRESSION:  1.  No definite pneumothorax with left chest tube in place. Resolving subcutaneous emphysema. 2.  Low lung volumes with bibasilar air space disease, as before.  Original Report Authenticated By: Reyes Ivan, M.D.   Dg Chest Port 1 View  09/10/2011  *RADIOLOGY REPORT*  Clinical Data: Follow up pneumothorax, evaluate chest.  PORTABLE CHEST - 1 VIEW  Comparison: Portable chest x-ray of 09/08/2011  Findings: The left chest tube remains extending to the left lung apex with the tip medially positioned.  No definite pneumothorax is seen.  Basilar opacities have improved slightly consistent with residual atelectasis left greater than right.  Mild cardiomegaly is stable.  Left chest wall subcutaneous air again is noted.  IMPRESSION: No change in medially positioned left apical chest tube with no definite pneumothorax.  Slightly better aeration.  Original Report Authenticated By: Juline Patch, M.D.   Dg Chest Portable 1 View  09/09/2011  *RADIOLOGY REPORT*  Clinical Data: Status post left-sided chest tube placement for pneumothorax; respiratory distress.  PORTABLE CHEST - 1 VIEW  Comparison: Chest radiograph performed earlier today at 11:25 p.m.  Findings: The lungs are significantly hypoexpanded.  There has been interval placement of a left apical chest tube, with apparent re- expansion of the left lung.  No definite pneumothorax is now seen. However, the lungs are not well assessed due to the degree of hypoexpansion.  Bilateral airspace opacities could reflect atelectasis or pneumonia, on comparison with prior CT from 08/14/2011.  The unusual pattern of air overlying the mediastinum reflects underlying blebs.  The mediastinum is not well assessed due to adjacent consolidation. No acute osseous abnormalities are identified.  IMPRESSION:  1.  No definite evidence of pneumothorax, status post placement of left  apical chest tube. 2.  The lungs are markedly hypoexpanded, with dense bilateral airspace opacities, more prominent on the left.  This could reflect atelectasis or pneumonia, on correlation with prior CT; the  unusual pattern of air overlying the mediastinum reflects underlying blebs.  Original Report Authenticated By: Tonia Ghent, M.D.   Dg Chest Portable 1 View  09/08/2011  *RADIOLOGY REPORT*  Clinical Data: Decompression of left pneumothorax.  PORTABLE CHEST - 1 VIEW  Comparison: Earlier the same day  Findings: 2325 hours.  Marked thoracolumbar scoliosis distorts the thoracic and cardiomediastinal anatomy.  There is an apparent angiocath or small sheath no superimposed on the upper left chest wall.  The volume of gas within the left pleural space has decreased in the interval and there is some re-expansion of the left lung.  The left pneumothorax remains large, but there does appear to be less rightward shift of cardiomediastinal structures compared to the previous film.  There is some persistent to probable atelectasis at the right base. Cardiopericardial silhouette may be enlarged, but the distortion by scoliosis makes assessment difficult. Telemetry leads overlie the chest.  IMPRESSION: Interval evacuation of some of the left pleural air with slightly less rightward shift of cardiomediastinal structures although the marked thoracolumbar scoliosis hinders this assessment.  Right base atelectasis.  Original Report Authenticated By: ERIC A. MANSELL, M.D.   Dg Chest Portable 1 View  09/08/2011  *RADIOLOGY REPORT*  Clinical Data: Respiratory distress.  Shortness of breath.  PORTABLE CHEST - 1 VIEW  Comparison: Chest radiograph performed 08/22/2011  Findings: There is complete collapse of the left lung, with rightward shift of the mediastinum, compatible with a tension pneumothorax.  Right basilar airspace opacification likely reflects residual atelectasis.  No definite pleural effusion is identified.  The  cardiomediastinal silhouette is difficult to fully assess due to overlying pneumothorax lucency.  No acute osseous abnormalities are identified.  IMPRESSION:  1.  Complete collapse of the left lung, with rightward shift of the mediastinum, compatible with a tension pneumothorax. 2.  Right basilar airspace opacity likely reflects residual atelectasis.  Critical Value/emergent results were called by telephone at the time of interpretation on 09/08/2011  at 10:58 p.m.  to  Dr. Marliss Czar- Ned Card, who verbally acknowledged these results.  Original Report Authenticated By: Tonia Ghent, M.D.   Dg Chest Port 1 View  08/22/2011  *RADIOLOGY REPORT*  Clinical Data: 46 year old female status post right thoracentesis. Pneumothorax.  PORTABLE CHEST - 1 VIEW  Comparison: 08/20/2011 and earlier.  Findings: Portable upright AP view 0730 hours.  Severe scoliosis. Stable lung volumes.  Mildly improved ventilation at the right lung base with residual confluent probable atelectasis.  No pneumothorax or edema.  Small right pleural effusion.  Stable cardiac size and mediastinal contours.  Visualized tracheal air column is within normal limits.  IMPRESSION: Mildly improved right lung base ventilation.  Residual atelectasis and small effusion.  Original Report Authenticated By: Harley Hallmark, M.D.   Dg Chest Port 1 View  08/17/2011  *RADIOLOGY REPORT*  Clinical Data: ARDS and pleural effusion. Follow-up pneumothorax.  PORTABLE CHEST - 1 VIEW  Comparison: 08/16/2011  Findings: This is a low-volume film with moderate bibasilar atelectasis. A right pleural effusion is again identified. A right IJ central venous catheter with tip overlying the mid SVC again noted. There is no evidence of pneumothorax. There is been little interval change since prior study.  IMPRESSION: Stable chest radiograph.  No evidence of pneumothorax.  Original Report Authenticated By: Rosendo Gros, M.D.   Dg Chest Port 1 View  08/16/2011  *RADIOLOGY REPORT*   Clinical Data: Post right thoracentesis  PORTABLE CHEST - 1 VIEW  Comparison: Chest radiograph 08/16/2011  Findings: Right central  venous line is unchanged.  Stable cardiac silhouette.  There is a modest decrease in right pleural effusion. There are low lung volumes.  No pneumothorax appreciated.  Small left effusion and low lung volumes are stable.  IMPRESSION:  1.  No evidence of pneumothorax following right thoracentesis.  2.  Mild  decrease in pleural effusion.  Original Report Authenticated By: Genevive Bi, M.D.   Dg Chest Port 1 View  08/16/2011  *RADIOLOGY REPORT*  Clinical Data: Thoracentesis.  PORTABLE CHEST - 1 VIEW  Comparison: 08/16/2011  Findings: Right IJ line tip projects over the SVC.  Very low lung volumes are again noted.  No pneumothorax observed; the previous left apical pneumothorax is no longer readily visualized.  Large right pleural effusion may be mildly reduced in size compared to the prior exam but remains large, with associated passive atelectasis.  There is atelectasis at the left lung base as well.  Cardiac margins are obscured.  IMPRESSION:  1.  Potentially reduced right pleural effusion, although a large pleural effusion persist.  There is associated passive atelectasis. No pneumothorax. 2.  Stable atelectasis at the left lung base.  Original Report Authenticated By: Dellia Cloud, M.D.   Dg Chest Port 1 View  08/16/2011  *RADIOLOGY REPORT*  Clinical Data: Lung disease. Recently intubated patient.  PORTABLE CHEST - 1 VIEW  Comparison:   08/15/2011 and 08/14/2011.  Findings: 0530 hours. There has been apparent interval extubation and removal of the nasogastric tube.  Right IJ central venous catheter is unchanged in the mid SVC.  There are right greater than left pleural effusions with associated bibasilar air space opacities.  The right pleural effusion and basilar pulmonary opacity appear worse. Previously noted left pneumothorax is not well visualized but may have a  minimal residual apical component. Heart size and mediastinal contours are stable.  There is a severe convex left thoracolumbar scoliosis.  IMPRESSION: Worsening right pleural effusion and basilar air space disease following extubation. Probable minimal residual left apical pneumothorax.  Original Report Authenticated By: Gerrianne Scale, M.D.   Dg Chest Port 1 View  08/15/2011  *RADIOLOGY REPORT*  Clinical Data: 46 year old female with ventilation dependent respiratory failure.  Sepsis.  PORTABLE CHEST - 1 VIEW  Comparison: 08/14/2011 and earlier.  Findings: AP portable semi upright view 0601 hours.  Endotracheal tube tip projects between level of the clavicles and carina. Enteric tube is looped in the over the gastric air.  Stable right IJ central line.  Continued lucency in the left hemithorax suspicious for pneumothorax, likely stable.  Right lung base opacity is stable.  Stable cardiac size and mediastinal contours.  IMPRESSION: 1. Stable lines and tubes. 2.  Persistent left pneumothorax suspected and probably stable in extent. 3. Stable right lung pneumonia with some superimposed pleural fluid.  Original Report Authenticated By: Harley Hallmark, M.D.   Dg Chest Port 1 View  08/15/2011  *RADIOLOGY REPORT*  Clinical Data: Evaluate left-sided pneumothorax.  PORTABLE CHEST - 1 VIEW  Comparison: 08/14/2011 CT and 08/13/2011 chest x-ray.  Findings: The CT detected left pneumothorax is not as well delineated on the present plain film exam.  The subtle lucency in the left costophrenic angle region may reflect the CT detected pneumothorax.  Bullae/bleb superior medial aspect left lung limiting evaluation in this region for detection of small pneumothorax.  Right base consolidation with loculated hydropneumothorax.  Right central line tip proximal superior vena cava.  Nasogastric tube courses below the diaphragm.  The tip is not included on  this exam.  Endotracheal tube tip 7.2 cm above the carina.  IMPRESSION:  CT detected left-sided pneumothorax and right-sided hydropneumothorax better demonstrated on the CT and poorly delineated by plain film exam.  Original Report Authenticated By: Fuller Canada, M.D.   Dg Abd Portable 1v  08/14/2011  *RADIOLOGY REPORT*  Clinical Data: Evaluate for possible ileus.  PORTABLE ABDOMEN - 1 VIEW  Comparison: Abdominal radiograph 08/11/2011.  Findings: A nasogastric tube is seen coiled within the stomach. There is some gas and stool seen scattered throughout the colon extending to the level of the distal rectum.  No pathologic distension of small bowel is appreciated.  There is a paucity of bowel gas throughout the right side of the abdomen.  IMPRESSION: 1.  Nonspecific bowel gas pattern, as above, not consistent with underlying ileus.  Original Report Authenticated By: Florencia Reasons, M.D.   US Thoracentesis Asp Pleural Space W/img Guide  08/16/2011  *RADIOLOGY REPORT*  Clinical Data:  Right pleural effusion.  Request has been made for diagnostic right-sided thoracentesis.  ULTRASOUND GUIDED right THORACENTESIS  Comparison:  Prior CT imaging of the chest.  An ultrasound guided thoracentesis was thoroughly discussed with the patient and questions answered.  The benefits, risks, alternatives and complications were also discussed.  The patient understands and wishes to proceed with the procedure.  Written consent was obtained.  Ultrasound was performed to localize and mark an adequate pocket of fluid in the right chest.  The area was then prepped and draped in the normal sterile fashion.  1% Lidocaine was used for local anesthesia.  Under ultrasound guidance a 19 gauge Yueh catheter was introduced.  Thoracentesis was performed.  The catheter was removed and a dressing applied.  Complications:  None immediate  Findings: A total of approximately 90 ml of bloody serous fluid was removed. A fluid sample was sent for laboratory analysis.  IMPRESSION: Successful ultrasound guided right  thoracentesis yielding 90 ml of pleural fluid.  Read by: Anselm Pancoast, P.A.-C  Original Report Authenticated By: Richarda Overlie, M.D.    Scheduled Meds:    . fentaNYL  75 mcg Transdermal Q72H  . FLUoxetine  60 mg Oral Daily  . heparin  5,000 Units Subcutaneous Q8H  . levETIRAcetam  250 mg Oral Daily  . mulitivitamin with minerals  1 tablet Oral Daily  . oxybutynin  10 mg Oral Daily  . simethicone  80 mg Oral QID  . DISCONTD: fentaNYL  50 mcg Transdermal Q72H  . DISCONTD: moxifloxacin  400 mg Oral q1800   Continuous Infusions:

## 2011-09-13 NOTE — Progress Notes (Signed)
At approximately 2100 hrs pt requested Fentanyl IV for surgical pain. The medication was administered slowly through a running IV as per policy. I was called  Back to [facility] and found her crying  and  sobbing  When ask what the problem was pt stated  that she did not want me to be her nurse and that  I was  the only  nurse who administer the medication this  way.  She future stated that " the two nurses before you gave me through my IV " I informed the pt that I administered the medication correctly according to our policy. Pt requested to speak to my supervisor. The Charge Nurse and [employer] visited with pt.

## 2011-09-13 NOTE — Progress Notes (Signed)
Pt informed writer that whenever she calls for her care providers she would like to be told that " my nurse is with another patient and whenever she is through she will be with you instead of your nurse will be with shortly mam" During  this conversation pt has several  tearful outburst and suddenly changed to another topic. The Monitor Tech. Were informed, pt was listened to and supported. We will continue to monitor.

## 2011-09-14 ENCOUNTER — Inpatient Hospital Stay (HOSPITAL_COMMUNITY): Payer: Medicare Other

## 2011-09-14 DIAGNOSIS — J159 Unspecified bacterial pneumonia: Secondary | ICD-10-CM

## 2011-09-14 DIAGNOSIS — F329 Major depressive disorder, single episode, unspecified: Secondary | ICD-10-CM

## 2011-09-14 DIAGNOSIS — J930 Spontaneous tension pneumothorax: Secondary | ICD-10-CM

## 2011-09-14 DIAGNOSIS — G8921 Chronic pain due to trauma: Secondary | ICD-10-CM

## 2011-09-14 MED ORDER — PANTOPRAZOLE SODIUM 40 MG PO TBEC
40.0000 mg | DELAYED_RELEASE_TABLET | Freq: Every day | ORAL | Status: DC
  Administered 2011-09-14 – 2011-09-21 (×7): 40 mg via ORAL
  Filled 2011-09-14 (×6): qty 1

## 2011-09-14 MED ORDER — ALUM & MAG HYDROXIDE-SIMETH 200-200-20 MG/5ML PO SUSP
15.0000 mL | ORAL | Status: DC | PRN
Start: 2011-09-14 — End: 2011-09-16
  Administered 2011-09-14: 15 mL via ORAL
  Filled 2011-09-14: qty 30

## 2011-09-14 NOTE — Progress Notes (Signed)
[facility] PROGRESS NOTE  Rhonda Ramos ZOX:[id number] DOB: 01-14-1966 DOA: 09/08/2011   Assessment/Plan: Patient Active Hospital Problem List: Tension pneumothorax (09/09/2011) -5/6 L chest tube. -cont CT per CVTS,  -CXR: residual pneumothorax seen along the left heart border  -pain management. 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 Stoped abx as there is no convincing evidence for infection on cxr Total days on abx 5  Code Status: Full code Family Communication: Mother [phone] Disposition Plan: home  [place], [place]  [facility] Pager [phone]  If 7PM-7AM, please contact night-coverage [email or web address] Password [facility] 09/14/2011, 7:53 AM   LOS: 6 days   Lines / Drains:  5/6 L chest tube (perCVTS) >>   Cultures / Sepsis markers:  5/7 blood cx >>   Antibiotics:  5/7 vanc (HCAP) >> 5/9  5/7 zosyn (HCAP) >> 5/9  Avelox 5/9 >> 5/11   Subjective: Left sided chest pain  Objective: Filed Vitals:   09/13/11 0542 09/13/11 1414 09/13/11 2100 09/14/11 0500  BP: 117/80 119/77 107/75 120/79  Pulse: 80 95 97 95  Temp: 98.9 F (37.2 C) 98.9 F (37.2 C) 98.9 F (37.2 C) 99 F (37.2 C)  TempSrc: Oral     Resp: 19 20 20 18   Height:      Weight: 48.6 kg (107 lb 2.3 oz)     SpO2: 96% 100% 95% 98%    Intake/Output Summary (Last 24 hours) at 09/14/11 0753 Last data filed at 09/14/11 0500  Gross per 24 hour  Intake    780 ml  Output   1206 ml  Net   -426 ml   Weight change:   Exam:  General: Alert, awake, oriented x3, in no acute distress.  HEENT: No bruits, no goiter.  Heart:  Regular rate and rhythm, without murmurs, rubs, gallops.  Lungs: Good air movement, bilateral air movement. Chest tube on the left side. Abdomen: Soft, nontender, nondistended, positive bowel sounds.  Neuro: Grossly intact, nonfocal.   Data Reviewed: Basic Metabolic Panel:  Lab 09/12/11 6578 09/11/11 0450 09/10/11 0434 09/09/11 0428 09/08/11 2248  NA 141 137 140 140 138  K 3.3* 3.9 -- -- --  CL 106 107 107 108 100  CO2 25 19 25 21 20   GLUCOSE 102* 88 139* 127* 134*  BUN 11 9 6 10 11   CREATININE 0.79 0.71 0.87 0.70 0.75  CALCIUM 10.0 9.4 10.0 9.0 10.6*  MG -- 1.7 -- -- --  PHOS -- 3.3 -- -- --   Liver Function Tests:  Lab 09/08/11 2248  AST 26  ALT 25  ALKPHOS 113  BILITOT 0.3  PROT 8.4*  ALBUMIN 4.2   No results found for this basename: LIPASE:5,AMYLASE:5 in the last 168 hours No results found for this basename: AMMONIA:5 in the last 168 hours CBC:  Lab 09/12/11 0550 09/10/11 0434 09/09/11 0428 09/08/11 2248  WBC 7.1 8.6 13.3* 9.6  NEUTROABS -- 5.5 -- 3.6  HGB 11.7* 12.0 11.6* 14.7  HCT 35.3* 36.1 34.7* 41.5  MCV 91.5 89.8 88.1 87.4  PLT 222 194 223 264   Cardiac Enzymes:  Lab 09/08/11 2248  CKTOTAL --  CKMB --  CKMBINDEX --  TROPONINI <0.30   BNP: No components found with this basename: POCBNP:5 CBG:  Lab 09/09/11 0246  GLUCAP 134*    Recent Results (from the past 240 hour(s))  CULTURE, BLOOD (ROUTINE X 2)     Status: Normal (Preliminary result)   Collection Time   09/09/11  2:10 AM      Component Value Range Status Comment   Specimen Description BLOOD LEFT HAND   Final    Special Requests BOTTLES DRAWN AEROBIC ONLY 5CC   Final    Culture  Setup Time [id number]   Final    Culture     Final    Value:        BLOOD CULTURE RECEIVED NO GROWTH TO DATE CULTURE WILL BE HELD FOR 5 DAYS BEFORE ISSUING A FINAL NEGATIVE REPORT   Report Status PENDING   Incomplete   CULTURE, BLOOD (ROUTINE X 2)     Status: Normal (Preliminary result)   Collection Time    09/09/11  2:10 AM      Component Value Range Status Comment   Specimen Description BLOOD LEFT ARM   Final    Special Requests BOTTLES DRAWN AEROBIC AND ANAEROBIC 10CC EA   Final    Culture  Setup Time [id number]   Final    Culture     Final    Value:        BLOOD CULTURE RECEIVED NO GROWTH TO DATE CULTURE WILL BE HELD FOR 5 DAYS BEFORE ISSUING A FINAL NEGATIVE REPORT   Report Status PENDING   Incomplete   MRSA PCR SCREENING     Status: Normal   Collection Time   09/09/11  2:35 AM      Component Value Range Status Comment   MRSA by PCR NEGATIVE  NEGATIVE  Final      Studies: Dg Chest 2 View  08/20/2011  *RADIOLOGY REPORT*  Clinical Data: Dyspnea  CHEST - 2 VIEW  Comparison: 08/17/2011  Findings: Removal of the right IJ central line.  Some interval improvement in bibasilar airspace disease/atelectasis.  Small right effusion remains.  No pneumothorax evident. Marked scoliosis of the spine.  IMPRESSION: Improving bibasilar airspace disease/atelectasis.  Persistent small right effusion.  Original Report Authenticated By: Judie Petit. Ruel Favors, M.D.   Ct Chest Wo Contrast  08/14/2011  *RADIOLOGY REPORT*  Clinical Data: History of right pleural effusion.  Question loculation.  Pneumothorax.  CT CHEST WITHOUT CONTRAST  Technique:  Multidetector CT imaging of the chest was performed following the standard protocol without IV contrast.  Comparison: Multiple prior chest radiographs and CT chest 08/06/2011.  Findings: Mediastinal lymph nodes are not enlarged by CT size criteria.  Hilar regions are difficult to definitively evaluate without IV contrast.  No axillary adenopathy.  Endotracheal tube is in satisfactory position.  Heart size normal.  No pericardial effusion.  There is a partially loculated small to moderate right pleural effusion with collapse/consolidation in the right middle and right lower lobes.  Postoperative changes are seen in the medial aspect of the lower right hemithorax, adjacent to focal areas  of lucency (images 14 to 26).  Lucencies correspond to blebs and pleural air on 08/06/2011.  Patchy ground-glass is seen diffusely.  Blebs and small left pneumothorax in the left hemithorax.  Airspace consolidation in the lingula and left lower lobe.  Incidental imaging of the upper abdomen shows no acute findings. No worrisome lytic or sclerotic lesions.  IMPRESSION:  1.  New partially loculated small to moderate right pleural  effusion, worrisome for empyema. 2.  Complete or near complete resolution of right pneumothorax, with a small amount of pleural air and/or blebs in the medial aspect of the lower right hemithorax, as before. 3.  Multilobar collapse/consolidation and ground-glass, most indicative of pneumonia. 4.  Small left pneumothorax.  Original Report Authenticated By: Reyes Ivan, M.D.   Dg Chest Portable 1 View  09/12/2011  *RADIOLOGY REPORT*  Clinical Data: Follow-up left pneumothorax.  PORTABLE CHEST - 1 VIEW  Comparison: 09/11/2011  Findings: Low lung volumes are again demonstrated with persistent bibasilar subsegmental atelectasis.  Left chest tube remains in place, and a tiny residual pneumothorax is seen along the left heart border.  Subcutaneous emphysema again seen in the left chest wall soft tissues.  Heart size remains normal.  IMPRESSION:  1.  Tiny residual pneumothorax seen along the left heart border. 2.  No significant change in bilateral subsegmental atelectasis.  Original Report Authenticated By: Danae Orleans, M.D.   Dg Chest Port 1 View  09/11/2011  *RADIOLOGY REPORT*  Clinical Data: Left chest tube, shortness of breath.  PORTABLE CHEST - 1 VIEW  Comparison: 09/10/2011.  Findings: Trachea is midline.  Heart size is grossly stable.  Left chest tube is in place.  No definite pneumothorax.  Subcutaneous emphysema is seen along the left neck and left chest wall, but has improved slightly in the interval.  Lungs are very low in volume with bibasilar air space disease, as before.   IMPRESSION:  1.  No definite pneumothorax with left chest tube in place. Resolving subcutaneous emphysema. 2.  Low lung volumes with bibasilar air space disease, as before.  Original Report Authenticated By: Reyes Ivan, M.D.   Dg Chest Port 1 View  09/10/2011  *RADIOLOGY REPORT*  Clinical Data: Follow up pneumothorax, evaluate chest.  PORTABLE CHEST - 1 VIEW  Comparison: Portable chest x-ray of 09/08/2011  Findings: The left chest tube remains extending to the left lung apex with the tip medially positioned.  No definite pneumothorax is seen.  Basilar opacities have improved slightly consistent with residual atelectasis left greater than right.  Mild cardiomegaly is stable.  Left chest wall subcutaneous air again is noted.  IMPRESSION: No change in medially positioned left apical chest tube with no definite pneumothorax.  Slightly better aeration.  Original Report Authenticated By: Juline Patch, M.D.   Dg Chest Portable 1 View  09/09/2011  *RADIOLOGY REPORT*  Clinical Data: Status post left-sided chest tube placement for pneumothorax; respiratory distress.  PORTABLE CHEST - 1 VIEW  Comparison: Chest radiograph performed earlier today at 11:25 p.m.  Findings: The lungs are significantly hypoexpanded.  There has been interval placement of a left apical chest tube, with apparent re- expansion of the left lung.  No definite pneumothorax is now seen. However, the lungs are not well assessed due to the degree of hypoexpansion.  Bilateral airspace opacities could reflect atelectasis or pneumonia, on comparison with prior CT from 08/14/2011.  The unusual pattern of air overlying the mediastinum reflects underlying blebs.  The mediastinum is not well assessed due to adjacent consolidation. No acute osseous abnormalities are identified.  IMPRESSION:  1.  No definite evidence of pneumothorax, status post placement of left apical chest tube. 2.  The lungs are markedly hypoexpanded, with dense bilateral airspace  opacities, more prominent on the left.  This could reflect atelectasis or pneumonia, on correlation with prior CT; the unusual pattern of air overlying the mediastinum reflects underlying blebs.  Original Report Authenticated  By: Tonia Ghent, M.D.   Dg Chest Portable 1 View  09/08/2011  *RADIOLOGY REPORT*  Clinical Data: Decompression of left pneumothorax.  PORTABLE CHEST - 1 VIEW  Comparison: Earlier the same day  Findings: 2325 hours.  Marked thoracolumbar scoliosis distorts the thoracic and cardiomediastinal anatomy.  There is an apparent angiocath or small sheath no superimposed on the upper left chest wall.  The volume of gas within the left pleural space has decreased in the interval and there is some re-expansion of the left lung.  The left pneumothorax remains large, but there does appear to be less rightward shift of cardiomediastinal structures compared to the previous film.  There is some persistent to probable atelectasis at the right base. Cardiopericardial silhouette may be enlarged, but the distortion by scoliosis makes assessment difficult. Telemetry leads overlie the chest.  IMPRESSION: Interval evacuation of some of the left pleural air with slightly less rightward shift of cardiomediastinal structures although the marked thoracolumbar scoliosis hinders this assessment.  Right base atelectasis.  Original Report Authenticated By: ERIC A. MANSELL, M.D.   Dg Chest Portable 1 View  09/08/2011  *RADIOLOGY REPORT*  Clinical Data: Respiratory distress.  Shortness of breath.  PORTABLE CHEST - 1 VIEW  Comparison: Chest radiograph performed 08/22/2011  Findings: There is complete collapse of the left lung, with rightward shift of the mediastinum, compatible with a tension pneumothorax.  Right basilar airspace opacification likely reflects residual atelectasis.  No definite pleural effusion is identified.  The cardiomediastinal silhouette is difficult to fully assess due to overlying pneumothorax  lucency.  No acute osseous abnormalities are identified.  IMPRESSION:  1.  Complete collapse of the left lung, with rightward shift of the mediastinum, compatible with a tension pneumothorax. 2.  Right basilar airspace opacity likely reflects residual atelectasis.  Critical Value/emergent results were called by telephone at the time of interpretation on 09/08/2011  at 10:58 p.m.  to  Dr. Marliss Czar- Ned Card, who verbally acknowledged these results.  Original Report Authenticated By: Tonia Ghent, M.D.   Dg Chest Port 1 View  08/22/2011  *RADIOLOGY REPORT*  Clinical Data: 46 year old female status post right thoracentesis. Pneumothorax.  PORTABLE CHEST - 1 VIEW  Comparison: 08/20/2011 and earlier.  Findings: Portable upright AP view 0730 hours.  Severe scoliosis. Stable lung volumes.  Mildly improved ventilation at the right lung base with residual confluent probable atelectasis.  No pneumothorax or edema.  Small right pleural effusion.  Stable cardiac size and mediastinal contours.  Visualized tracheal air column is within normal limits.  IMPRESSION: Mildly improved right lung base ventilation.  Residual atelectasis and small effusion.  Original Report Authenticated By: Harley Hallmark, M.D.   Dg Chest Port 1 View  08/17/2011  *RADIOLOGY REPORT*  Clinical Data: ARDS and pleural effusion. Follow-up pneumothorax.  PORTABLE CHEST - 1 VIEW  Comparison: 08/16/2011  Findings: This is a low-volume film with moderate bibasilar atelectasis. A right pleural effusion is again identified. A right IJ central venous catheter with tip overlying the mid SVC again noted. There is no evidence of pneumothorax. There is been little interval change since prior study.  IMPRESSION: Stable chest radiograph.  No evidence of pneumothorax.  Original Report Authenticated By: Rosendo Gros, M.D.   Dg Chest Port 1 View  08/16/2011  *RADIOLOGY REPORT*  Clinical Data: Post right thoracentesis  PORTABLE CHEST - 1 VIEW  Comparison: Chest  radiograph 08/16/2011  Findings: Right central venous line is unchanged.  Stable cardiac silhouette.  There is a modest decrease  in right pleural effusion. There are low lung volumes.  No pneumothorax appreciated.  Small left effusion and low lung volumes are stable.  IMPRESSION:  1.  No evidence of pneumothorax following right thoracentesis.  2.  Mild  decrease in pleural effusion.  Original Report Authenticated By: Genevive Bi, M.D.   Dg Chest Port 1 View  08/16/2011  *RADIOLOGY REPORT*  Clinical Data: Thoracentesis.  PORTABLE CHEST - 1 VIEW  Comparison: 08/16/2011  Findings: Right IJ line tip projects over the SVC.  Very low lung volumes are again noted.  No pneumothorax observed; the previous left apical pneumothorax is no longer readily visualized.  Large right pleural effusion may be mildly reduced in size compared to the prior exam but remains large, with associated passive atelectasis.  There is atelectasis at the left lung base as well.  Cardiac margins are obscured.  IMPRESSION:  1.  Potentially reduced right pleural effusion, although a large pleural effusion persist.  There is associated passive atelectasis. No pneumothorax. 2.  Stable atelectasis at the left lung base.  Original Report Authenticated By: Dellia Cloud, M.D.   Dg Chest Port 1 View  08/16/2011  *RADIOLOGY REPORT*  Clinical Data: Lung disease. Recently intubated patient.  PORTABLE CHEST - 1 VIEW  Comparison:   08/15/2011 and 08/14/2011.  Findings: 0530 hours. There has been apparent interval extubation and removal of the nasogastric tube.  Right IJ central venous catheter is unchanged in the mid SVC.  There are right greater than left pleural effusions with associated bibasilar air space opacities.  The right pleural effusion and basilar pulmonary opacity appear worse. Previously noted left pneumothorax is not well visualized but may have a minimal residual apical component. Heart size and mediastinal contours are stable.   There is a severe convex left thoracolumbar scoliosis.  IMPRESSION: Worsening right pleural effusion and basilar air space disease following extubation. Probable minimal residual left apical pneumothorax.  Original Report Authenticated By: Gerrianne Scale, M.D.   Dg Chest Port 1 View  08/15/2011  *RADIOLOGY REPORT*  Clinical Data: 46 year old female with ventilation dependent respiratory failure.  Sepsis.  PORTABLE CHEST - 1 VIEW  Comparison: 08/14/2011 and earlier.  Findings: AP portable semi upright view 0601 hours.  Endotracheal tube tip projects between level of the clavicles and carina. Enteric tube is looped in the over the gastric air.  Stable right IJ central line.  Continued lucency in the left hemithorax suspicious for pneumothorax, likely stable.  Right lung base opacity is stable.  Stable cardiac size and mediastinal contours.  IMPRESSION: 1. Stable lines and tubes. 2.  Persistent left pneumothorax suspected and probably stable in extent. 3. Stable right lung pneumonia with some superimposed pleural fluid.  Original Report Authenticated By: Harley Hallmark, M.D.   Dg Chest Port 1 View  08/15/2011  *RADIOLOGY REPORT*  Clinical Data: Evaluate left-sided pneumothorax.  PORTABLE CHEST - 1 VIEW  Comparison: 08/14/2011 CT and 08/13/2011 chest x-ray.  Findings: The CT detected left pneumothorax is not as well delineated on the present plain film exam.  The subtle lucency in the left costophrenic angle region may reflect the CT detected pneumothorax.  Bullae/bleb superior medial aspect left lung limiting evaluation in this region for detection of small pneumothorax.  Right base consolidation with loculated hydropneumothorax.  Right central line tip proximal superior vena cava.  Nasogastric tube courses below the diaphragm.  The tip is not included on this exam.  Endotracheal tube tip 7.2 cm above the carina.  IMPRESSION: CT  detected left-sided pneumothorax and right-sided hydropneumothorax better  demonstrated on the CT and poorly delineated by plain film exam.  Original Report Authenticated By: Fuller Canada, M.D.   Dg Abd Portable 1v  08/14/2011  *RADIOLOGY REPORT*  Clinical Data: Evaluate for possible ileus.  PORTABLE ABDOMEN - 1 VIEW  Comparison: Abdominal radiograph 08/11/2011.  Findings: A nasogastric tube is seen coiled within the stomach. There is some gas and stool seen scattered throughout the colon extending to the level of the distal rectum.  No pathologic distension of small bowel is appreciated.  There is a paucity of bowel gas throughout the right side of the abdomen.  IMPRESSION: 1.  Nonspecific bowel gas pattern, as above, not consistent with underlying ileus.  Original Report Authenticated By: Florencia Reasons, M.D.   US Thoracentesis Asp Pleural Space W/img Guide  08/16/2011  *RADIOLOGY REPORT*  Clinical Data:  Right pleural effusion.  Request has been made for diagnostic right-sided thoracentesis.  ULTRASOUND GUIDED right THORACENTESIS  Comparison:  Prior CT imaging of the chest.  An ultrasound guided thoracentesis was thoroughly discussed with the patient and questions answered.  The benefits, risks, alternatives and complications were also discussed.  The patient understands and wishes to proceed with the procedure.  Written consent was obtained.  Ultrasound was performed to localize and mark an adequate pocket of fluid in the right chest.  The area was then prepped and draped in the normal sterile fashion.  1% Lidocaine was used for local anesthesia.  Under ultrasound guidance a 19 gauge Yueh catheter was introduced.  Thoracentesis was performed.  The catheter was removed and a dressing applied.  Complications:  None immediate  Findings: A total of approximately 90 ml of bloody serous fluid was removed. A fluid sample was sent for laboratory analysis.  IMPRESSION: Successful ultrasound guided right thoracentesis yielding 90 ml of pleural fluid.  Read by: Anselm Pancoast,  P.A.-C  Original Report Authenticated By: Richarda Overlie, M.D.    Scheduled Meds:    . clonazePAM  0.5 mg Oral Once  . fentaNYL  75 mcg Transdermal Q72H  . FLUoxetine  60 mg Oral Daily  . heparin  5,000 Units Subcutaneous Q8H  . levETIRAcetam  250 mg Oral Daily  . mulitivitamin with minerals  1 tablet Oral Daily  . oxybutynin  10 mg Oral Daily  . potassium chloride  40 mEq Oral Once  . simethicone  80 mg Oral QID  . DISCONTD: fentaNYL  50 mcg Transdermal Q72H  . DISCONTD: moxifloxacin  400 mg Oral q1800   Continuous Infusions:

## 2011-09-14 NOTE — Progress Notes (Addendum)
[address]            Rhonda Ramos [id number]          [phone]          Subjective: Very nervous and emotional this am  Objective   Temp:  [98.9 F (37.2 C)-99 F (37.2 C)] 99 F (37.2 C) (05/12 0500) Pulse Rate:  [95-97] 95  (05/12 0500) Resp:  [18-20] 18  (05/12 0500) BP: (107-120)/(75-79) 120/79 mmHg (05/12 0500) SpO2:  [95 %-100 %] 98 % (05/12 0500)   Intake/Output Summary (Last 24 hours) at 09/14/11 1129 Last data filed at 09/14/11 0500  Gross per 24 hour  Intake    780 ml  Output   1206 ml  Net   -426 ml       General appearance: alert, cooperative, distracted and mild distress Heart: regular rate and rhythm and S1, S2 normal Lungs: diminished in bases  Lab Results:  Basename 09/12/11 0550  NA 141  K 3.3*  CL 106  CO2 25  GLUCOSE 102*  BUN 11  CREATININE 0.79  CALCIUM 10.0  MG --  PHOS --   No results found for this basename: AST:2,ALT:2,ALKPHOS:2,BILITOT:2,PROT:2,ALBUMIN:2 in the last 72 hours No results found for this basename: LIPASE:2,AMYLASE:2 in the last 72 hours  Basename 09/12/11 0550  WBC 7.1  NEUTROABS --  HGB 11.7*  HCT 35.3*  MCV 91.5  PLT 222   No results found for this basename: CKTOTAL:4,CKMB:4,TROPONINI:4 in the last 72 hours No components found with this basename: POCBNP:3 No results found for this basename: DDIMER in the last 72 hours No results found for this basename: HGBA1C in the last 72 hours No results found for this basename: CHOL,HDL,LDLCALC,TRIG,CHOLHDL in the last 72 hours No results found for this basename: TSH,T4TOTAL,FREET3,T3FREE,THYROIDAB in the last 72 hours No results found for this basename: VITAMINB12,FOLATE,FERRITIN,TIBC,IRON,RETICCTPCT in the last 72 hours  Medications: Scheduled    . clonazePAM  0.5 mg Oral Once  . fentaNYL  75 mcg Transdermal Q72H  . FLUoxetine  60 mg Oral Daily  . heparin  5,000 Units Subcutaneous Q8H  . levETIRAcetam  250 mg Oral Daily  .  mulitivitamin with minerals  1 tablet Oral Daily  . oxybutynin  10 mg Oral Daily  . potassium chloride  40 mEq Oral Once  . simethicone  80 mg Oral QID     Radiology/Studies:  Dg Chest Port 1 View  09/13/2011  *RADIOLOGY REPORT*  Clinical Data: Evaluate left-sided pneumothorax  PORTABLE CHEST - 1 VIEW  Comparison: 09/12/2011  Findings: The heart size appears normal.  No pleural effusion or edema.  There is a left-sided chest tube in place.  Tiny left pneumothorax along the left heart border is again noted and appears improved from previous exam.  There is extensive left chest wall emphysema.  Lung volumes are low.  There is extensive atelectasis and decreased aeration to both lung bases.  IMPRESSION:  1.  Low lung volumes with bibasilar atelectasis. 2.  Tiny residual pneumothorax is seen along the left heart border which appears less conspicuous from previous exam.  Original Report Authenticated By: Rosealee Albee, M.D.    Chest tube- large air leak  INR: Will add last result for INR, ABG once components are confirmed Will add last 4 CBG results once components are confirmed  Assessment/Plan: Continue chest tube to H2O seal  LOS: 6 days    Rhonda Ramos,Rhonda Ramos 5/12/201311:29 AM     Chart reviewed, patient examined, agree with above. She still has large air leak this am with cough.

## 2011-09-15 ENCOUNTER — Inpatient Hospital Stay (HOSPITAL_COMMUNITY): Payer: Medicare Other

## 2011-09-15 DIAGNOSIS — G8921 Chronic pain due to trauma: Secondary | ICD-10-CM

## 2011-09-15 DIAGNOSIS — F329 Major depressive disorder, single episode, unspecified: Secondary | ICD-10-CM

## 2011-09-15 LAB — CULTURE, BLOOD (ROUTINE X 2)
Culture  Setup Time: 201305070841
Culture: NO GROWTH

## 2011-09-15 MED ORDER — VANCOMYCIN HCL IN DEXTROSE 1-5 GM/200ML-% IV SOLN
1000.0000 mg | INTRAVENOUS | Status: AC
  Administered 2011-09-16 (×2): 1000 mg via INTRAVENOUS
  Filled 2011-09-15: qty 200

## 2011-09-15 MED ORDER — BISACODYL 5 MG PO TBEC
5.0000 mg | DELAYED_RELEASE_TABLET | Freq: Once | ORAL | Status: DC
  Filled 2011-09-15 (×2): qty 1

## 2011-09-15 MED ORDER — CLONAZEPAM 0.5 MG PO TABS
0.5000 mg | ORAL_TABLET | Freq: Once | ORAL | Status: AC
  Administered 2011-09-15: 0.5 mg via ORAL

## 2011-09-15 MED ORDER — DEXTROSE 5 % IV SOLN
1.5000 g | INTRAVENOUS | Status: AC
  Administered 2011-09-16 (×2): 1.5 g via INTRAVENOUS
  Filled 2011-09-15: qty 1.5

## 2011-09-15 NOTE — Progress Notes (Signed)
TCTS BRIEF PROGRESS NOTE   I met with the patient and her family this evening and again discussed matters at length. Alternative treatment strategies been discussed. We plan to proceed with left video-assisted thoracoscopy for blood resection and mechanical pleura these tomorrow afternoon. They understand and accept all associated risks of surgery including but not limited to risk of death, stroke, myocardial infarction, respiratory failure, pneumonia, pulmonary embolus, leaving requiring blood incision, arrhythmia, prolonged air leak requiring chest tube drainage, prolonged chest wall pain, or late recurrence of spontaneous pneumothorax on either side. All their questions been addressed.  Pallie Swigert H 09/15/2011 6:07 PM

## 2011-09-15 NOTE — Progress Notes (Signed)
[facility] PROGRESS NOTE  Rhonda Ramos JXB:[id number] DOB: 04-22-1966 DOA: 09/08/2011   Assessment/Plan: Patient Active Hospital Problem List: Tension pneumothorax (09/09/2011) -5/6 L chest tube. -cont CT per CVTS,  -CXR: residual pneumothorax seen along the left heart border  -pain management. 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 Stoped abx as there is no convincing evidence for infection on cxr Total days on abx 5  Code Status: Full code Family Communication: Mother [phone] Disposition Plan: home  Rhonda Ramos [place], [place]  [facility] Pager [phone]  If 7PM-7AM, please contact night-coverage [email or web address] Password [email or web address] 09/15/2011, 5:37 PM   LOS: 7 days   Lines / Drains:  5/6 L chest tube (perCVTS) >>   Cultures / Sepsis markers:  5/7 blood cx >>   Antibiotics:  5/7 vanc (HCAP) >> 5/9  5/7 zosyn (HCAP) >> 5/9  Avelox 5/9 >> 5/11   Subjective: Left sided chest pain  Objective: Filed Vitals:   09/14/11 1500 09/14/11 2100 09/15/11 0500 09/15/11 1400  BP: 105/74 102/67 102/67 103/66  Pulse: 93 95 93 100  Temp: 98.7 F (37.1 C) 99.2 F (37.3 C) 99.1 F (37.3 C) 98.9 F (37.2 C)  TempSrc:    Oral  Resp: 20 18 16 18   Height:      Weight:      SpO2: 97% 96% 95% 93%    Intake/Output Summary (Last 24 hours) at 09/15/11 1737 Last data filed at 09/15/11 1250  Gross per 24 hour  Intake    480 ml  Output   1850 ml  Net  -1370 ml   Weight change:   Exam:  General: Alert, awake, oriented x3, in no acute distress.  HEENT: No bruits, no goiter.  Heart: Regular rate and  rhythm, without murmurs, rubs, gallops.  Lungs: Good air movement, bilateral air movement. Chest tube on the left side. Abdomen: Soft, nontender, nondistended, positive bowel sounds.     Data Reviewed: Basic Metabolic Panel:  Lab 09/12/11 4132 09/11/11 0450 09/10/11 0434 09/09/11 0428 09/08/11 2248  NA 141 137 140 140 138  K 3.3* 3.9 -- -- --  CL 106 107 107 108 100  CO2 25 19 25 21 20   GLUCOSE 102* 88 139* 127* 134*  BUN 11 9 6 10 11   CREATININE 0.79 0.71 0.87 0.70 0.75  CALCIUM 10.0 9.4 10.0 9.0 10.6*  MG -- 1.7 -- -- --  PHOS -- 3.3 -- -- --   Liver Function Tests:  Lab 09/08/11 2248  AST 26  ALT 25  ALKPHOS 113  BILITOT 0.3  PROT 8.4*  ALBUMIN 4.2   No results found for this basename: LIPASE:5,AMYLASE:5 in the last 168 hours No results found for this basename: AMMONIA:5 in the last 168 hours CBC:  Lab 09/12/11 0550 09/10/11 0434 09/09/11 0428 09/08/11 2248  WBC 7.1 8.6 13.3* 9.6  NEUTROABS -- 5.5 -- 3.6  HGB 11.7* 12.0 11.6* 14.7  HCT 35.3* 36.1 34.7* 41.5  MCV 91.5 89.8 88.1 87.4  PLT 222 194 223 264   Cardiac Enzymes:  Lab 09/08/11 2248  CKTOTAL --  CKMB --  CKMBINDEX --  TROPONINI <0.30   BNP:  No components found with this basename: POCBNP:5 CBG:  Lab 09/09/11 0246  GLUCAP 134*    Recent Results (from the past 240 hour(s))  CULTURE, BLOOD (ROUTINE X 2)     Status: Normal   Collection Time   09/09/11  2:10 AM      Component Value Range Status Comment   Specimen Description BLOOD LEFT HAND   Final    Special Requests BOTTLES DRAWN AEROBIC ONLY 5CC   Final    Culture  Setup Time [id number]   Final    Culture NO GROWTH 5 DAYS   Final    Report Status 09/15/2011 FINAL   Final   CULTURE, BLOOD (ROUTINE X 2)     Status: Normal   Collection Time   09/09/11  2:10 AM      Component Value Range Status Comment   Specimen Description BLOOD LEFT ARM   Final    Special Requests BOTTLES DRAWN AEROBIC AND ANAEROBIC 10CC EA   Final    Culture  Setup Time  [id number]   Final    Culture NO GROWTH 5 DAYS   Final    Report Status 09/15/2011 FINAL   Final   MRSA PCR SCREENING     Status: Normal   Collection Time   09/09/11  2:35 AM      Component Value Range Status Comment   MRSA by PCR NEGATIVE  NEGATIVE  Final      Studies: Dg Chest 2 View  08/20/2011  *RADIOLOGY REPORT*  Clinical Data: Dyspnea  CHEST - 2 VIEW  Comparison: 08/17/2011  Findings: Removal of the right IJ central line.  Some interval improvement in bibasilar airspace disease/atelectasis.  Small right effusion remains.  No pneumothorax evident. Marked scoliosis of the spine.  IMPRESSION: Improving bibasilar airspace disease/atelectasis.  Persistent small right effusion.  Original Report Authenticated By: Judie Petit. Ruel Favors, M.D.   Ct Chest Wo Contrast  08/14/2011  *RADIOLOGY REPORT*  Clinical Data: History of right pleural effusion.  Question loculation.  Pneumothorax.  CT CHEST WITHOUT CONTRAST  Technique:  Multidetector CT imaging of the chest was performed following the standard protocol without IV contrast.  Comparison: Multiple prior chest radiographs and CT chest 08/06/2011.  Findings: Mediastinal lymph nodes are not enlarged by CT size criteria.  Hilar regions are difficult to definitively evaluate without IV contrast.  No axillary adenopathy.  Endotracheal tube is in satisfactory position.  Heart size normal.  No pericardial effusion.  There is a partially loculated small to moderate right pleural effusion with collapse/consolidation in the right middle and right lower lobes.  Postoperative changes are seen in the medial aspect of the lower right hemithorax, adjacent to focal areas of lucency (images 14 to 26).  Lucencies correspond to blebs and pleural air on 08/06/2011.  Patchy ground-glass is seen diffusely.  Blebs and small left pneumothorax in the left hemithorax.  Airspace consolidation in the lingula and left lower lobe.  Incidental imaging of the upper abdomen shows no acute  findings. No worrisome lytic or sclerotic lesions.  IMPRESSION:  1.  New partially loculated small to moderate right pleural effusion, worrisome for empyema. 2.  Complete or near complete resolution of right pneumothorax, with a small amount of pleural air and/or blebs in the medial aspect of the lower right hemithorax, as before. 3.  Multilobar collapse/consolidation and ground-glass, most indicative of pneumonia. 4.  Small left pneumothorax.  Original Report Authenticated By: Reyes Ivan, M.D.   Dg Chest Portable 1 View  09/12/2011  *RADIOLOGY REPORT*  Clinical Data: Follow-up left pneumothorax.  PORTABLE CHEST - 1 VIEW  Comparison: 09/11/2011  Findings: Low lung volumes are again demonstrated with persistent bibasilar subsegmental atelectasis.  Left chest tube remains in place, and a tiny residual pneumothorax is seen along the left heart border.  Subcutaneous emphysema again seen in the left chest wall soft tissues.  Heart size remains normal.  IMPRESSION:  1.  Tiny residual pneumothorax seen along the left heart border. 2.  No significant change in bilateral subsegmental atelectasis.  Original Report Authenticated By: Danae Orleans, M.D.   Dg Chest Port 1 View  09/11/2011  *RADIOLOGY REPORT*  Clinical Data: Left chest tube, shortness of breath.  PORTABLE CHEST - 1 VIEW  Comparison: 09/10/2011.  Findings: Trachea is midline.  Heart size is grossly stable.  Left chest tube is in place.  No definite pneumothorax.  Subcutaneous emphysema is seen along the left neck and left chest wall, but has improved slightly in the interval.  Lungs are very low in volume with bibasilar air space disease, as before.  IMPRESSION:  1.  No definite pneumothorax with left chest tube in place. Resolving subcutaneous emphysema. 2.  Low lung volumes with bibasilar air space disease, as before.  Original Report Authenticated By: Reyes Ivan, M.D.   Dg Chest Port 1 View  09/10/2011  *RADIOLOGY REPORT*  Clinical Data:  Follow up pneumothorax, evaluate chest.  PORTABLE CHEST - 1 VIEW  Comparison: Portable chest x-ray of 09/08/2011  Findings: The left chest tube remains extending to the left lung apex with the tip medially positioned.  No definite pneumothorax is seen.  Basilar opacities have improved slightly consistent with residual atelectasis left greater than right.  Mild cardiomegaly is stable.  Left chest wall subcutaneous air again is noted.  IMPRESSION: No change in medially positioned left apical chest tube with no definite pneumothorax.  Slightly better aeration.  Original Report Authenticated By: Juline Patch, M.D.   Dg Chest Portable 1 View  09/09/2011  *RADIOLOGY REPORT*  Clinical Data: Status post left-sided chest tube placement for pneumothorax; respiratory distress.  PORTABLE CHEST - 1 VIEW  Comparison: Chest radiograph performed earlier today at 11:25 p.m.  Findings: The lungs are significantly hypoexpanded.  There has been interval placement of a left apical chest tube, with apparent re- expansion of the left lung.  No definite pneumothorax is now seen. However, the lungs are not well assessed due to the degree of hypoexpansion.  Bilateral airspace opacities could reflect atelectasis or pneumonia, on comparison with prior CT from 08/14/2011.  The unusual pattern of air overlying the mediastinum reflects underlying blebs.  The mediastinum is not well assessed due to adjacent consolidation. No acute osseous abnormalities are identified.  IMPRESSION:  1.  No definite evidence of pneumothorax, status post placement of left apical chest tube. 2.  The lungs are markedly hypoexpanded, with dense bilateral airspace opacities, more prominent on the left.  This could reflect atelectasis or pneumonia, on correlation with prior CT; the unusual pattern of air overlying the mediastinum reflects underlying blebs.  Original Report Authenticated By: Tonia Ghent, M.D.   Dg Chest Portable 1 View  09/08/2011  *RADIOLOGY  REPORT*  Clinical Data: Decompression of left pneumothorax.  PORTABLE CHEST - 1 VIEW  Comparison: Earlier the same day  Findings: 2325 hours.  Marked thoracolumbar scoliosis distorts the thoracic and cardiomediastinal anatomy.  There is an apparent angiocath or small sheath no superimposed on the upper left chest wall.  The volume of gas within the left pleural space has decreased in the interval and there is some re-expansion of the left lung.  The left pneumothorax remains large, but there does appear to be less rightward shift of cardiomediastinal structures compared to the previous film.  There is some persistent to probable atelectasis at the right base. Cardiopericardial silhouette may be enlarged, but the distortion by scoliosis makes assessment difficult. Telemetry leads overlie the chest.  IMPRESSION: Interval evacuation of some of the left pleural air with slightly less rightward shift of cardiomediastinal structures although the marked thoracolumbar scoliosis hinders this assessment.  Right base atelectasis.  Original Report Authenticated By: ERIC A. MANSELL, M.D.   Dg Chest Portable 1 View  09/08/2011  *RADIOLOGY REPORT*  Clinical Data: Respiratory distress.  Shortness of breath.  PORTABLE CHEST - 1 VIEW  Comparison: Chest radiograph performed 08/22/2011  Findings: There is complete collapse of the left lung, with rightward shift of the mediastinum, compatible with a tension pneumothorax.  Right basilar airspace opacification likely reflects residual atelectasis.  No definite pleural effusion is identified.  The cardiomediastinal silhouette is difficult to fully assess due to overlying pneumothorax lucency.  No acute osseous abnormalities are identified.  IMPRESSION:  1.  Complete collapse of the left lung, with rightward shift of the mediastinum, compatible with a tension pneumothorax. 2.  Right basilar airspace opacity likely reflects residual atelectasis.  Critical Value/emergent results were called  by telephone at the time of interpretation on 09/08/2011  at 10:58 p.m.  to  Dr. Marliss Czar- Ned Card, who verbally acknowledged these results.  Original Report Authenticated By: Tonia Ghent, M.D.   Dg Chest Port 1 View  08/22/2011  *RADIOLOGY REPORT*  Clinical Data: 46 year old female status post right thoracentesis. Pneumothorax.  PORTABLE CHEST - 1 VIEW  Comparison: 08/20/2011 and earlier.  Findings: Portable upright AP view 0730 hours.  Severe scoliosis. Stable lung volumes.  Mildly improved ventilation at the right lung base with residual confluent probable atelectasis.  No pneumothorax or edema.  Small right pleural effusion.  Stable cardiac size and mediastinal contours.  Visualized tracheal air column is within normal limits.  IMPRESSION: Mildly improved right lung base ventilation.  Residual atelectasis and small effusion.  Original Report Authenticated By: Harley Hallmark, M.D.   Dg Chest Port 1 View  08/17/2011  *RADIOLOGY REPORT*  Clinical Data: ARDS and pleural effusion. Follow-up pneumothorax.  PORTABLE CHEST - 1 VIEW  Comparison: 08/16/2011  Findings: This is a low-volume film with moderate bibasilar atelectasis. A right pleural effusion is again identified. A right IJ central venous catheter with tip overlying the mid SVC again noted. There is no evidence of pneumothorax. There is been little interval change since prior study.  IMPRESSION: Stable chest radiograph.  No evidence of pneumothorax.  Original Report Authenticated By: Rosendo Gros, M.D.   Dg Chest Port 1 View  08/16/2011  *RADIOLOGY REPORT*  Clinical Data: Post right thoracentesis  PORTABLE CHEST - 1 VIEW  Comparison: Chest radiograph 08/16/2011  Findings: Right central venous line is unchanged.  Stable cardiac silhouette.  There is a modest decrease in right pleural effusion. There are low lung volumes.  No pneumothorax appreciated.  Small left effusion and low lung volumes are stable.  IMPRESSION:  1.  No evidence of pneumothorax  following right thoracentesis.  2.  Mild  decrease in pleural effusion.  Original Report Authenticated By: Genevive Bi, M.D.   Dg Chest Port 1 View  08/16/2011  *RADIOLOGY REPORT*  Clinical Data:  Thoracentesis.  PORTABLE CHEST - 1 VIEW  Comparison: 08/16/2011  Findings: Right IJ line tip projects over the SVC.  Very low lung volumes are again noted.  No pneumothorax observed; the previous left apical pneumothorax is no longer readily visualized.  Large right pleural effusion may be mildly reduced in size compared to the prior exam but remains large, with associated passive atelectasis.  There is atelectasis at the left lung base as well.  Cardiac margins are obscured.  IMPRESSION:  1.  Potentially reduced right pleural effusion, although a large pleural effusion persist.  There is associated passive atelectasis. No pneumothorax. 2.  Stable atelectasis at the left lung base.  Original Report Authenticated By: Dellia Cloud, M.D.   Dg Chest Port 1 View  08/16/2011  *RADIOLOGY REPORT*  Clinical Data: Lung disease. Recently intubated patient.  PORTABLE CHEST - 1 VIEW  Comparison:   08/15/2011 and 08/14/2011.  Findings: 0530 hours. There has been apparent interval extubation and removal of the nasogastric tube.  Right IJ central venous catheter is unchanged in the mid SVC.  There are right greater than left pleural effusions with associated bibasilar air space opacities.  The right pleural effusion and basilar pulmonary opacity appear worse. Previously noted left pneumothorax is not well visualized but may have a minimal residual apical component. Heart size and mediastinal contours are stable.  There is a severe convex left thoracolumbar scoliosis.  IMPRESSION: Worsening right pleural effusion and basilar air space disease following extubation. Probable minimal residual left apical pneumothorax.  Original Report Authenticated By: Gerrianne Scale, M.D.   Dg Chest Port 1 View  08/15/2011  *RADIOLOGY  REPORT*  Clinical Data: 46 year old female with ventilation dependent respiratory failure.  Sepsis.  PORTABLE CHEST - 1 VIEW  Comparison: 08/14/2011 and earlier.  Findings: AP portable semi upright view 0601 hours.  Endotracheal tube tip projects between level of the clavicles and carina. Enteric tube is looped in the over the gastric air.  Stable right IJ central line.  Continued lucency in the left hemithorax suspicious for pneumothorax, likely stable.  Right lung base opacity is stable.  Stable cardiac size and mediastinal contours.  IMPRESSION: 1. Stable lines and tubes. 2.  Persistent left pneumothorax suspected and probably stable in extent. 3. Stable right lung pneumonia with some superimposed pleural fluid.  Original Report Authenticated By: Harley Hallmark, M.D.   Dg Chest Port 1 View  08/15/2011  *RADIOLOGY REPORT*  Clinical Data: Evaluate left-sided pneumothorax.  PORTABLE CHEST - 1 VIEW  Comparison: 08/14/2011 CT and 08/13/2011 chest x-ray.  Findings: The CT detected left pneumothorax is not as well delineated on the present plain film exam.  The subtle lucency in the left costophrenic angle region may reflect the CT detected pneumothorax.  Bullae/bleb superior medial aspect left lung limiting evaluation in this region for detection of small pneumothorax.  Right base consolidation with loculated hydropneumothorax.  Right central line tip proximal superior vena cava.  Nasogastric tube courses below the diaphragm.  The tip is not included on this exam.  Endotracheal tube tip 7.2 cm above the carina.  IMPRESSION: CT detected left-sided pneumothorax and right-sided hydropneumothorax better demonstrated on the CT and poorly delineated by plain film exam.  Original Report Authenticated By: Fuller Canada, M.D.   Dg Abd Portable 1v  08/14/2011  *RADIOLOGY REPORT*  Clinical Data: Evaluate for possible ileus.  PORTABLE ABDOMEN - 1 VIEW  Comparison: Abdominal radiograph 08/11/2011.  Findings: A nasogastric  tube is seen coiled within the  stomach. There is some gas and stool seen scattered throughout the colon extending to the level of the distal rectum.  No pathologic distension of small bowel is appreciated.  There is a paucity of bowel gas throughout the right side of the abdomen.  IMPRESSION: 1.  Nonspecific bowel gas pattern, as above, not consistent with underlying ileus.  Original Report Authenticated By: Florencia Reasons, M.D.   US Thoracentesis Asp Pleural Space W/img Guide  08/16/2011  *RADIOLOGY REPORT*  Clinical Data:  Right pleural effusion.  Request has been made for diagnostic right-sided thoracentesis.  ULTRASOUND GUIDED right THORACENTESIS  Comparison:  Prior CT imaging of the chest.  An ultrasound guided thoracentesis was thoroughly discussed with the patient and questions answered.  The benefits, risks, alternatives and complications were also discussed.  The patient understands and wishes to proceed with the procedure.  Written consent was obtained.  Ultrasound was performed to localize and mark an adequate pocket of fluid in the right chest.  The area was then prepped and draped in the normal sterile fashion.  1% Lidocaine was used for local anesthesia.  Under ultrasound guidance a 19 gauge Yueh catheter was introduced.  Thoracentesis was performed.  The catheter was removed and a dressing applied.  Complications:  None immediate  Findings: A total of approximately 90 ml of bloody serous fluid was removed. A fluid sample was sent for laboratory analysis.  IMPRESSION: Successful ultrasound guided right thoracentesis yielding 90 ml of pleural fluid.  Read by: Anselm Pancoast, P.A.-C  Original Report Authenticated By: Richarda Overlie, M.D.    Scheduled Meds:    . cefUROXime (ZINACEF)  IV  1.5 g Intravenous 30 min Pre-Op  . fentaNYL  75 mcg Transdermal Q72H  . FLUoxetine  60 mg Oral Daily  . heparin  5,000 Units Subcutaneous Q8H  . levETIRAcetam  250 mg Oral Daily  . mulitivitamin with  minerals  1 tablet Oral Daily  . oxybutynin  10 mg Oral Daily  . pantoprazole  40 mg Oral Q1200  . simethicone  80 mg Oral QID  . vancomycin  1,000 mg Intravenous 60 min Pre-Op   Continuous Infusions:

## 2011-09-15 NOTE — Progress Notes (Signed)
   CARDIOTHORACIC SURGERY PROGRESS NOTE   Subjective: No new complaints  Objective: Vital signs in last 24 hours: Temp:  [98.7 F (37.1 C)-99.2 F (37.3 C)] 99.1 F (37.3 C) (05/13 0500) Pulse Rate:  [93-95] 93  (05/13 0500) Cardiac Rhythm:  [-] Normal sinus rhythm (05/13 0831) Resp:  [16-20] 16  (05/13 0500) BP: (102-105)/(67-74) 102/67 mmHg (05/13 0500) SpO2:  [95 %-97 %] 95 % (05/13 0500)  Physical Exam:  Rhythm:   sinus  Breath sounds: clear  Heart sounds:  RRR  Incisions:  n/a  Abdomen:  soft  Extremities:  warm  Chest tube(s):  + large air leak   Intake/Output from previous day: 05/12 0701 - 05/13 0700 In: 240 [P.O.:240] Out: 1250 [Urine:1250] Intake/Output this shift: Total I/O In: 0  Out: 600 [Urine:600]  Lab Results: No results found for this basename: WBC:2,HGB:2,HCT:2,PLT:2 in the last 72 hours BMET: No results found for this basename: NA:2,K:2,CL:2,CO2:2,GLUCOSE:2,BUN:2,CREATININE:2,CALCIUM:2 in the last 72 hours  CBG (last 3)  No results found for this basename: GLUCAP:3 in the last 72 hours  CXR:   *RADIOLOGY REPORT*  Clinical Data: Left chest tube.  PORTABLE CHEST - 1 VIEW  Comparison: 09/14/2011  Findings: Left chest tube remains in place, unchanged.  Subcutaneous air is stable. No visible pneumothorax. Bibasilar  atelectasis. Heart is normal size. Severe thoracolumbar  scoliosis.  IMPRESSION:  Left chest tube remains in stable position without visible  pneumothorax. Bibasilar atelectasis.  Original Report Authenticated By: Cyndie Chime, M.D.        Assessment/Plan:  Large persistent airleak following chest tube placement last week for left spontaneous pneumothorax. I discussed further treatment options with the patient including proceeding to surgery for video-assisted thoracoscopy for stapling of blebs and mechanical pleurodesis. We contrasted this approach with the alternative of converting her existing chest tube to a mini express  one-way valve for home chest tube therapy. We tentatively plan to proceed with surgery tomorrow. I will meet with the patient and her family later today to further discuss risks and benefits of surgery.  Purcell Nails 09/15/2011 12:33 PM

## 2011-09-16 ENCOUNTER — Encounter (HOSPITAL_COMMUNITY): Payer: Self-pay | Admitting: Anesthesiology

## 2011-09-16 ENCOUNTER — Encounter (HOSPITAL_COMMUNITY)
Admission: EM | Disposition: A | Discharge status: Home / Self Care | Payer: Self-pay | Source: Ambulatory Visit | Attending: Internal Medicine

## 2011-09-16 ENCOUNTER — Inpatient Hospital Stay (HOSPITAL_COMMUNITY): Payer: Medicare Other

## 2011-09-16 ENCOUNTER — Inpatient Hospital Stay (HOSPITAL_COMMUNITY): Payer: Medicare Other | Admitting: Anesthesiology

## 2011-09-16 DIAGNOSIS — J930 Spontaneous tension pneumothorax: Secondary | ICD-10-CM

## 2011-09-16 DIAGNOSIS — F329 Major depressive disorder, single episode, unspecified: Secondary | ICD-10-CM

## 2011-09-16 DIAGNOSIS — G8921 Chronic pain due to trauma: Secondary | ICD-10-CM

## 2011-09-16 DIAGNOSIS — J159 Unspecified bacterial pneumonia: Secondary | ICD-10-CM

## 2011-09-16 LAB — TYPE AND SCREEN: Antibody Screen: NEGATIVE

## 2011-09-16 LAB — PROTIME-INR: INR: 0.95 (ref 0.00–1.49)

## 2011-09-16 LAB — SURGICAL PCR SCREEN: MRSA, PCR: NEGATIVE

## 2011-09-16 SURGERY — VIDEO ASSISTED THORACOSCOPY (VATS) W/TALC PLEUADESIS
Anesthesia: General | Site: Chest | Laterality: Left | Wound class: Clean Contaminated

## 2011-09-16 MED ORDER — GLYCOPYRROLATE 0.2 MG/ML IJ SOLN
INTRAMUSCULAR | Status: DC | PRN
  Administered 2011-09-16: .4 mg via INTRAVENOUS

## 2011-09-16 MED ORDER — HYDROMORPHONE HCL PF 1 MG/ML IJ SOLN
0.2500 mg | INTRAMUSCULAR | Status: DC | PRN
  Administered 2011-09-16 (×4): 0.5 mg via INTRAVENOUS

## 2011-09-16 MED ORDER — OXYCODONE HCL 5 MG PO TABS
5.0000 mg | ORAL_TABLET | ORAL | Status: AC | PRN
  Administered 2011-09-16 – 2011-09-17 (×3): 10 mg via ORAL
  Filled 2011-09-16 (×3): qty 2

## 2011-09-16 MED ORDER — PROPOFOL 10 MG/ML IV EMUL
INTRAVENOUS | Status: DC | PRN
  Administered 2011-09-16: 170 mg via INTRAVENOUS

## 2011-09-16 MED ORDER — DEXTROSE 5 % IV SOLN
1.5000 g | Freq: Two times a day (BID) | INTRAVENOUS | Status: AC
  Administered 2011-09-16 – 2011-09-17 (×2): 1.5 g via INTRAVENOUS
  Filled 2011-09-16 (×2): qty 1.5

## 2011-09-16 MED ORDER — FENTANYL CITRATE 0.05 MG/ML IJ SOLN
INTRAMUSCULAR | Status: AC
  Filled 2011-09-16: qty 4

## 2011-09-16 MED ORDER — PHENYLEPHRINE HCL 10 MG/ML IJ SOLN
INTRAMUSCULAR | Status: DC | PRN
  Administered 2011-09-16: 120 ug via INTRAVENOUS

## 2011-09-16 MED ORDER — VANCOMYCIN HCL IN DEXTROSE 1-5 GM/200ML-% IV SOLN
1000.0000 mg | Freq: Two times a day (BID) | INTRAVENOUS | Status: AC
  Administered 2011-09-16: 1000 mg via INTRAVENOUS
  Filled 2011-09-16: qty 200

## 2011-09-16 MED ORDER — LORAZEPAM 2 MG/ML IJ SOLN: 1.0000 mg | Freq: Once | INTRAMUSCULAR | Status: DC | PRN

## 2011-09-16 MED ORDER — POTASSIUM CHLORIDE 10 MEQ/50ML IV SOLN: 10.0000 meq | Freq: Every day | INTRAVENOUS | Status: DC | PRN

## 2011-09-16 MED ORDER — LACTATED RINGERS IV SOLN
INTRAVENOUS | Status: DC | PRN
  Administered 2011-09-16: 13:00:00 via INTRAVENOUS

## 2011-09-16 MED ORDER — OXYCODONE-ACETAMINOPHEN 5-325 MG PO TABS
1.0000 | ORAL_TABLET | ORAL | Status: DC | PRN
  Administered 2011-09-18: 2 via ORAL
  Filled 2011-09-16 (×3): qty 2

## 2011-09-16 MED ORDER — BISACODYL 5 MG PO TBEC
10.0000 mg | DELAYED_RELEASE_TABLET | Freq: Every day | ORAL | Status: DC
  Administered 2011-09-18 – 2011-09-21 (×2): 10 mg via ORAL
  Filled 2011-09-16 (×2): qty 2

## 2011-09-16 MED ORDER — 0.9 % SODIUM CHLORIDE (POUR BTL) OPTIME
TOPICAL | Status: DC | PRN
  Administered 2011-09-16: 1000 mL

## 2011-09-16 MED ORDER — ONDANSETRON HCL 4 MG/2ML IJ SOLN
INTRAMUSCULAR | Status: DC | PRN
  Administered 2011-09-16: 4 mg via INTRAVENOUS

## 2011-09-16 MED ORDER — LACTATED RINGERS IV SOLN
INTRAVENOUS | Status: DC
  Administered 2011-09-16: 12:00:00 via INTRAVENOUS

## 2011-09-16 MED ORDER — NEOSTIGMINE METHYLSULFATE 1 MG/ML IJ SOLN
INTRAMUSCULAR | Status: DC | PRN
  Administered 2011-09-16: 3 mg via INTRAVENOUS

## 2011-09-16 MED ORDER — FENTANYL CITRATE 0.05 MG/ML IJ SOLN
50.0000 ug | INTRAMUSCULAR | Status: DC | PRN
  Administered 2011-09-16 (×2): 50 ug via INTRAVENOUS

## 2011-09-16 MED ORDER — FENTANYL CITRATE 0.05 MG/ML IJ SOLN
50.0000 ug | INTRAMUSCULAR | Status: AC | PRN
  Administered 2011-09-16 (×2): 50 ug via INTRAVENOUS

## 2011-09-16 MED ORDER — MIDAZOLAM HCL 2 MG/2ML IJ SOLN
INTRAMUSCULAR | Status: AC
  Filled 2011-09-16: qty 4

## 2011-09-16 MED ORDER — MIDAZOLAM HCL 2 MG/2ML IJ SOLN
2.0000 mg | Freq: Once | INTRAMUSCULAR | Status: AC
  Administered 2011-09-16: 2 mg via INTRAVENOUS

## 2011-09-16 MED ORDER — MIDAZOLAM HCL 2 MG/2ML IJ SOLN
1.0000 mg | INTRAMUSCULAR | Status: DC | PRN
  Administered 2011-09-16: 2 mg via INTRAVENOUS

## 2011-09-16 MED ORDER — ONDANSETRON HCL 4 MG/2ML IJ SOLN: 4.0000 mg | Freq: Four times a day (QID) | INTRAMUSCULAR | Status: DC | PRN

## 2011-09-16 MED ORDER — ROCURONIUM BROMIDE 100 MG/10ML IV SOLN
INTRAVENOUS | Status: DC | PRN
  Administered 2011-09-16: 10 mg via INTRAVENOUS
  Administered 2011-09-16: 40 mg via INTRAVENOUS

## 2011-09-16 MED ORDER — ACETAMINOPHEN 10 MG/ML IV SOLN
1000.0000 mg | Freq: Four times a day (QID) | INTRAVENOUS | Status: AC
  Administered 2011-09-16 – 2011-09-17 (×4): 1000 mg via INTRAVENOUS
  Filled 2011-09-16 (×4): qty 100

## 2011-09-16 MED ORDER — LUNG SURGERY BOOK
Freq: Once | Status: AC
  Administered 2011-09-16: 22:00:00
  Filled 2011-09-16: qty 1

## 2011-09-16 MED ORDER — FENTANYL CITRATE 0.05 MG/ML IJ SOLN
50.0000 ug | INTRAMUSCULAR | Status: DC | PRN
  Administered 2011-09-16 – 2011-09-17 (×8): 50 ug via INTRAVENOUS
  Filled 2011-09-16 (×7): qty 2

## 2011-09-16 MED ORDER — KCL IN DEXTROSE-NACL 20-5-0.45 MEQ/L-%-% IV SOLN
INTRAVENOUS | Status: DC
  Administered 2011-09-16: 18:00:00 via INTRAVENOUS
  Filled 2011-09-16 (×6): qty 1000

## 2011-09-16 MED ORDER — FENTANYL CITRATE 0.05 MG/ML IJ SOLN
INTRAMUSCULAR | Status: DC | PRN
  Administered 2011-09-16 (×2): 50 ug via INTRAVENOUS
  Administered 2011-09-16: 100 ug via INTRAVENOUS
  Administered 2011-09-16 (×4): 50 ug via INTRAVENOUS

## 2011-09-16 MED ORDER — HYDROMORPHONE HCL PF 1 MG/ML IJ SOLN
INTRAMUSCULAR | Status: AC
  Filled 2011-09-16: qty 1

## 2011-09-16 MED ORDER — LACTATED RINGERS IV SOLN
INTRAVENOUS | Status: DC | PRN
  Administered 2011-09-16 (×2): via INTRAVENOUS

## 2011-09-16 MED ORDER — ALBUTEROL SULFATE (5 MG/ML) 0.5% IN NEBU
2.5000 mg | INHALATION_SOLUTION | RESPIRATORY_TRACT | Status: DC
  Administered 2011-09-16 – 2011-09-17 (×4): 2.5 mg via RESPIRATORY_TRACT
  Filled 2011-09-16 (×4): qty 0.5

## 2011-09-16 MED ORDER — SENNOSIDES-DOCUSATE SODIUM 8.6-50 MG PO TABS
1.0000 | ORAL_TABLET | Freq: Every evening | ORAL | Status: DC | PRN
  Filled 2011-09-16: qty 1

## 2011-09-16 SURGICAL SUPPLY — 63 items
ADH SKN CLS APL DERMABOND .7 (GAUZE/BANDAGES/DRESSINGS) ×1
APL SRG 22X2 LUM MLBL SLNT (VASCULAR PRODUCTS)
APPLICATOR TIP EXT COSEAL (VASCULAR PRODUCTS) IMPLANT
APPLIER CLIP ROT 10 11.4 M/L (STAPLE)
APR CLP MED LRG 11.4X10 (STAPLE)
CANISTER SUCTION 2500CC (MISCELLANEOUS) ×2 IMPLANT
CATH THORACIC 28FR (CATHETERS) IMPLANT
CATH THORACIC 36FR (CATHETERS) IMPLANT
CATH THORACIC 36FR RT ANG (CATHETERS) ×2 IMPLANT
CLIP APPLIE ROT 10 11.4 M/L (STAPLE) IMPLANT
CLIP TI MEDIUM 6 (CLIP) ×2 IMPLANT
CLOTH BEACON ORANGE TIMEOUT ST (SAFETY) ×2 IMPLANT
CONT SPEC 4OZ CLIKSEAL STRL BL (MISCELLANEOUS) ×4 IMPLANT
COVER SURGICAL LIGHT HANDLE (MISCELLANEOUS) ×4 IMPLANT
DERMABOND ADVANCED (GAUZE/BANDAGES/DRESSINGS) ×1
DERMABOND ADVANCED .7 DNX12 (GAUZE/BANDAGES/DRESSINGS) IMPLANT
DRAPE LAPAROSCOPIC ABDOMINAL (DRAPES) ×2 IMPLANT
DRAPE SLUSH MACHINE 52X66 (DRAPES) ×2 IMPLANT
DRAPE SLUSH/WARMER DISC (DRAPES) ×2 IMPLANT
DRILL BIT 7/64X5 (BIT) IMPLANT
ELECT REM PT RETURN 9FT ADLT (ELECTROSURGICAL) ×2
ELECTRODE REM PT RTRN 9FT ADLT (ELECTROSURGICAL) ×1 IMPLANT
GLOVE EUDERMIC 7 POWDERFREE (GLOVE) ×4 IMPLANT
GOWN PREVENTION PLUS XLARGE (GOWN DISPOSABLE) ×2 IMPLANT
GOWN STRL NON-REIN LRG LVL3 (GOWN DISPOSABLE) ×4 IMPLANT
HANDLE STAPLE ENDO GIA SHORT (STAPLE) ×1
KIT BASIN OR (CUSTOM PROCEDURE TRAY) ×2 IMPLANT
KIT ROOM TURNOVER OR (KITS) ×2 IMPLANT
NS IRRIG 1000ML POUR BTL (IV SOLUTION) ×4 IMPLANT
PACK CHEST (CUSTOM PROCEDURE TRAY) ×2 IMPLANT
PAD ARMBOARD 7.5X6 YLW CONV (MISCELLANEOUS) ×4 IMPLANT
RELOAD EGIA 60 MED/THCK PURPLE (STAPLE) ×12 IMPLANT
SEALANT PROGEL (MISCELLANEOUS) IMPLANT
SEALANT SURG COSEAL 4ML (VASCULAR PRODUCTS) IMPLANT
SEALANT SURG COSEAL 8ML (VASCULAR PRODUCTS) IMPLANT
SOLUTION ANTI FOG 6CC (MISCELLANEOUS) ×2 IMPLANT
SPONGE GAUZE 4X4 12PLY (GAUZE/BANDAGES/DRESSINGS) ×4 IMPLANT
STAPLER ENDO GIA 12MM SHORT (STAPLE) ×1 IMPLANT
STRIP PERI DRY VERITAS 60 (STAPLE) ×6 IMPLANT
SUT MNCRL AB 4-0 PS2 18 (SUTURE) ×2 IMPLANT
SUT PROLENE 3 0 SH DA (SUTURE) IMPLANT
SUT PROLENE 4 0 RB 1 (SUTURE)
SUT PROLENE 4-0 RB1 .5 CRCL 36 (SUTURE) IMPLANT
SUT SILK  1 MH (SUTURE) ×4
SUT SILK 1 MH (SUTURE) ×4 IMPLANT
SUT SILK 2 0SH CR/8 30 (SUTURE) ×1 IMPLANT
SUT VIC AB 1 CTX 18 (SUTURE) IMPLANT
SUT VIC AB 1 CTX 36 (SUTURE)
SUT VIC AB 1 CTX36XBRD ANBCTR (SUTURE) IMPLANT
SUT VIC AB 2-0 CTX 36 (SUTURE) IMPLANT
SUT VIC AB 2-0 UR6 27 (SUTURE) ×6 IMPLANT
SUT VIC AB 3-0 SH 8-18 (SUTURE) ×2 IMPLANT
SUT VIC AB 3-0 X1 27 (SUTURE) ×2 IMPLANT
SUT VICRYL 2 TP 1 (SUTURE) IMPLANT
SYSTEM SAHARA CHEST DRAIN RE-I (WOUND CARE) ×2 IMPLANT
TAPE CLOTH 4X10 WHT NS (GAUZE/BANDAGES/DRESSINGS) ×2 IMPLANT
TAPE CLOTH SURG 4X10 WHT LF (GAUZE/BANDAGES/DRESSINGS) ×3 IMPLANT
TIP APPLICATOR SPRAY EXTEND 16 (VASCULAR PRODUCTS) IMPLANT
TOWEL OR 17X24 6PK STRL BLUE (TOWEL DISPOSABLE) ×4 IMPLANT
TOWEL OR 17X26 10 PK STRL BLUE (TOWEL DISPOSABLE) ×4 IMPLANT
TRAY FOLEY CATH 14FRSI W/METER (CATHETERS) ×2 IMPLANT
TUNNELER SHEATH ON-Q 11GX8 (MISCELLANEOUS) IMPLANT
WATER STERILE IRR 1000ML POUR (IV SOLUTION) ×4 IMPLANT

## 2011-09-16 NOTE — Progress Notes (Signed)
UR Completed Reena Borromeo Graves-Bigelow, RN,BSN [phone]  

## 2011-09-16 NOTE — Progress Notes (Signed)
Patient remained monitored during central line placement on 4 lpm oxygen with pulse ox 95% and heart rate Sinus tach rate 102- 106

## 2011-09-16 NOTE — Care Management Note (Unsigned)
    Page 1 of 1   09/16/2011     2:29:42 PM   CARE MANAGEMENT NOTE 09/16/2011  Patient:  Rhonda Ramos, Rhonda Ramos   Account Number:  [id number]  Date Initiated:  09/16/2011  Documentation initiated by:  GRAVES-BIGELOW,Adriyana Greenbaum  Subjective/Objective Assessment:   Pt admitted with recurrent pneumothoraces. Plan for VATS today.     Action/Plan:   Anticipated DC Date:  09/20/2011   Anticipated DC Plan:  HOME W HOME HEALTH SERVICES      DC Planning Services  CM consult      Choice offered to / List presented to:             Status of service:  In process, will continue to follow Medicare Important Message given?   (If response is "NO", the following Medicare IM given date fields will be blank) Date Medicare IM given:   Date Additional Medicare IM given:    Discharge Disposition:    Per UR Regulation:    If discussed at Long Length of Stay Meetings, dates discussed:    Comments:  09-16-11 1428 Tomi Bamberger, RN,BSN [phone] CM will continue to monitor for d/c disposition needs.

## 2011-09-16 NOTE — Anesthesia Preprocedure Evaluation (Addendum)
Anesthesia Evaluation  Patient identified by MRN, date of birth, ID band Patient awake    Airway Mallampati: I TM Distance: >3 FB Neck ROM: Full    Dental  (+) Edentulous Upper and Edentulous Lower   Pulmonary COPD Spina bifida spont pneumoth hx bilat + rhonchi  Decreased breath sounds left  + decreased breath sounds      Cardiovascular hypertension, Pt. on medications Rhythm:Regular Rate:Tachycardia     Neuro/Psych Seizures -,  Anxiety Depression    GI/Hepatic GERD-  Medicated and Controlled,  Endo/Other    Renal/GU      Musculoskeletal   Abdominal   Peds  Hematology   Anesthesia Other Findings   Reproductive/Obstetrics                         Anesthesia Physical Anesthesia Plan  ASA: III  Anesthesia Plan: General   Post-op Pain Management:    Induction: Intravenous  Airway Management Planned: Double Lumen EBT  Additional Equipment: Arterial line and CVP  Intra-op Plan:   Post-operative Plan: Extubation in OR and Possible Post-op intubation/ventilation  Informed Consent: I have reviewed the patients History and Physical, chart, labs and discussed the procedure including the risks, benefits and alternatives for the proposed anesthesia with the patient or authorized representative who has indicated his/her understanding and acceptance.     Plan Discussed with: CRNA, Surgeon and Anesthesiologist  Anesthesia Plan Comments:        Anesthesia Quick Evaluation

## 2011-09-16 NOTE — Progress Notes (Signed)
[facility] PROGRESS NOTE  ARNIE MAIOLO UJW:[id number] DOB: April 02, 1966 DOA: 09/08/2011   Assessment/Plan: Patient Active Hospital Problem List: Tension pneumothorax (09/09/2011) -5/6 L chest tube. -cont CT per CVTS,  -CXR: residual pneumothorax seen along the left heart border  -pain management. -  - for VATS 09/16/11   DEPRESSION (11/13/2009)  -continue home SSRI  And clonazepam prn  Chronic pain syndrome (11/13/2009) -continue home fentanyl patch and fentanyl prn  -changed percocet to oxy IR (tylenol upsets stomach)  GERD  Start ppi   Seizure (08/28/2010) -home keppra  Pneumonia (08/11/2011) - Avelox 5/9 - 5/11 -afebrile, no leukocytosis. Stoped abx as there is no convincing evidence for infection on cxr Total days on abx 5  Code Status: Full code Family Communication: Mother [phone] Disposition Plan: home  Michale Weikel [place], [place]  [facility] Pager [phone]  If 7PM-7AM, please contact night-coverage [email or web address] Password [facility] 09/16/2011, 10:54 AM   LOS: 8 days   Lines / Drains:  5/6 L chest tube (perCVTS) >>   Cultures / Sepsis markers:  5/7 blood cx >>   Antibiotics:  5/7 vanc (HCAP) >> 5/9  5/7 zosyn (HCAP) >> 5/9  Avelox 5/9 >> 5/11   Subjective: Severe Left sided chest pain every time she moves  Objective: Filed Vitals:   09/15/11 0500 09/15/11 1400 09/15/11 2100 09/16/11 0500  BP: 102/67 103/66 101/66 117/70  Pulse: 93 100 91 92  Temp: 99.1 F (37.3 C) 98.9 F (37.2 C) 98.5 F (36.9 C)   TempSrc:  Oral Oral Oral  Resp: 16 18 18 18   Height:    4\' 9"  (1.448 m)  Weight:    47.7 kg (105 lb 2.6 oz)  SpO2: 95% 93% 94% 96%    Intake/Output Summary (Last 24 hours) at 09/16/11 1054 Last data filed at 09/15/11 1250  Gross per 24 hour  Intake    240 ml  Output      0 ml  Net    240 ml   Weight change:   Exam:  General: Alert, awake, oriented x3, in no acute distress.  HEENT: No bruits, no goiter.  Heart: Regular  rate and rhythm, without murmurs, rubs, gallops.  Lungs: Good air movement, bilateral air movement. Chest tube on the left side. Abdomen: Soft, nontender, nondistended, positive bowel sounds.     Data Reviewed: Basic Metabolic Panel:  Lab 09/12/11 5284 09/11/11 0450 09/10/11 0434  NA 141 137 140  K 3.3* 3.9 --  CL 106 107 107  CO2 25 19 25   GLUCOSE 102* 88 139*  BUN 11 9 6   CREATININE 0.79 0.71 0.87  CALCIUM 10.0 9.4 10.0  MG -- 1.7 --  PHOS -- 3.3 --   Liver Function Tests: No results found for this basename: AST:5,ALT:5,ALKPHOS:5,BILITOT:5,PROT:5,ALBUMIN:5 in the last 168 hours No results found for this basename: LIPASE:5,AMYLASE:5 in the last 168 hours No results found for this basename: AMMONIA:5 in the last 168 hours CBC:  Lab 09/12/11 0550 09/10/11 0434  WBC 7.1 8.6  NEUTROABS -- 5.5  HGB 11.7* 12.0  HCT 35.3* 36.1  MCV 91.5 89.8  PLT 222 194   Cardiac Enzymes: No results found for this basename: CKTOTAL:5,CKMB:5,CKMBINDEX:5,TROPONINI:5 in the last 168 hours BNP: No components found with this basename: POCBNP:5 CBG: No results found for this basename: GLUCAP:5 in the last 168 hours  Recent Results (from the past 240 hour(s))  CULTURE, BLOOD (ROUTINE X 2)     Status: Normal   Collection Time   09/09/11  2:10 AM      Component Value Range Status Comment   Specimen Description BLOOD LEFT HAND   Final    Special Requests BOTTLES DRAWN AEROBIC ONLY 5CC   Final    Culture  Setup Time [id number]   Final    Culture NO GROWTH 5 DAYS   Final    Report Status 09/15/2011 FINAL   Final   CULTURE, BLOOD (ROUTINE X 2)     Status: Normal   Collection Time   09/09/11  2:10 AM      Component Value Range Status Comment   Specimen Description BLOOD LEFT ARM   Final    Special Requests BOTTLES DRAWN AEROBIC AND ANAEROBIC 10CC EA   Final    Culture  Setup Time [id number]   Final    Culture NO GROWTH 5 DAYS   Final    Report Status 09/15/2011 FINAL   Final   MRSA PCR  SCREENING     Status: Normal   Collection Time   09/09/11  2:35 AM      Component Value Range Status Comment   MRSA by PCR NEGATIVE  NEGATIVE  Final   SURGICAL PCR SCREEN     Status: Normal   Collection Time   09/16/11  2:44 AM      Component Value Range Status Comment   MRSA, PCR NEGATIVE  NEGATIVE  Final    Staphylococcus aureus NEGATIVE  NEGATIVE  Final      Studies: Dg Chest 2 View  08/20/2011  *RADIOLOGY REPORT*  Clinical Data: Dyspnea  CHEST - 2 VIEW  Comparison: 08/17/2011  Findings: Removal of the right IJ central line.  Some interval improvement in bibasilar airspace disease/atelectasis.  Small right effusion remains.  No pneumothorax evident. Marked scoliosis of the spine.  IMPRESSION: Improving bibasilar airspace disease/atelectasis.  Persistent small right effusion.  Original Report Authenticated By: Judie Petit. Ruel Favors, M.D.   Ct Chest Wo Contrast  08/14/2011  *RADIOLOGY REPORT*  Clinical Data: History of right pleural effusion.  Question loculation.  Pneumothorax.  CT CHEST WITHOUT CONTRAST  Technique:  Multidetector CT imaging of the chest was performed following the standard protocol without IV contrast.  Comparison: Multiple prior chest radiographs and CT chest 08/06/2011.  Findings: Mediastinal lymph nodes are not enlarged by CT size criteria.  Hilar regions are difficult to definitively evaluate without IV contrast.  No axillary adenopathy.  Endotracheal tube is in satisfactory position.  Heart size normal.  No pericardial effusion.  There is a partially loculated small to moderate right pleural effusion with collapse/consolidation in the right middle and right lower lobes.  Postoperative changes are seen in the medial aspect of the lower right hemithorax, adjacent to focal areas of lucency (images 14 to 26).  Lucencies correspond to blebs and pleural air on 08/06/2011.  Patchy ground-glass is seen diffusely.  Blebs and small left pneumothorax in the left hemithorax.  Airspace  consolidation in the lingula and left lower lobe.  Incidental imaging of the upper abdomen shows no acute findings. No worrisome lytic or sclerotic lesions.  IMPRESSION:  1.  New partially loculated small to moderate right pleural effusion, worrisome for empyema. 2.  Complete or near complete resolution of right pneumothorax, with a small amount of pleural air and/or blebs in the medial aspect of the lower right hemithorax, as before. 3.  Multilobar collapse/consolidation and ground-glass, most indicative of pneumonia. 4.  Small left pneumothorax.  Original Report Authenticated By: Reyes Ivan, M.D.   Dg  Chest Portable 1 View  09/12/2011  *RADIOLOGY REPORT*  Clinical Data: Follow-up left pneumothorax.  PORTABLE CHEST - 1 VIEW  Comparison: 09/11/2011  Findings: Low lung volumes are again demonstrated with persistent bibasilar subsegmental atelectasis.  Left chest tube remains in place, and a tiny residual pneumothorax is seen along the left heart border.  Subcutaneous emphysema again seen in the left chest wall soft tissues.  Heart size remains normal.  IMPRESSION:  1.  Tiny residual pneumothorax seen along the left heart border. 2.  No significant change in bilateral subsegmental atelectasis.  Original Report Authenticated By: Danae Orleans, M.D.   Dg Chest Port 1 View  09/11/2011  *RADIOLOGY REPORT*  Clinical Data: Left chest tube, shortness of breath.  PORTABLE CHEST - 1 VIEW  Comparison: 09/10/2011.  Findings: Trachea is midline.  Heart size is grossly stable.  Left chest tube is in place.  No definite pneumothorax.  Subcutaneous emphysema is seen along the left neck and left chest wall, but has improved slightly in the interval.  Lungs are very low in volume with bibasilar air space disease, as before.  IMPRESSION:  1.  No definite pneumothorax with left chest tube in place. Resolving subcutaneous emphysema. 2.  Low lung volumes with bibasilar air space disease, as before.  Original Report  Authenticated By: Reyes Ivan, M.D.   Dg Chest Port 1 View  09/10/2011  *RADIOLOGY REPORT*  Clinical Data: Follow up pneumothorax, evaluate chest.  PORTABLE CHEST - 1 VIEW  Comparison: Portable chest x-ray of 09/08/2011  Findings: The left chest tube remains extending to the left lung apex with the tip medially positioned.  No definite pneumothorax is seen.  Basilar opacities have improved slightly consistent with residual atelectasis left greater than right.  Mild cardiomegaly is stable.  Left chest wall subcutaneous air again is noted.  IMPRESSION: No change in medially positioned left apical chest tube with no definite pneumothorax.  Slightly better aeration.  Original Report Authenticated By: Juline Patch, M.D.   Dg Chest Portable 1 View  09/09/2011  *RADIOLOGY REPORT*  Clinical Data: Status post left-sided chest tube placement for pneumothorax; respiratory distress.  PORTABLE CHEST - 1 VIEW  Comparison: Chest radiograph performed earlier today at 11:25 p.m.  Findings: The lungs are significantly hypoexpanded.  There has been interval placement of a left apical chest tube, with apparent re- expansion of the left lung.  No definite pneumothorax is now seen. However, the lungs are not well assessed due to the degree of hypoexpansion.  Bilateral airspace opacities could reflect atelectasis or pneumonia, on comparison with prior CT from 08/14/2011.  The unusual pattern of air overlying the mediastinum reflects underlying blebs.  The mediastinum is not well assessed due to adjacent consolidation. No acute osseous abnormalities are identified.  IMPRESSION:  1.  No definite evidence of pneumothorax, status post placement of left apical chest tube. 2.  The lungs are markedly hypoexpanded, with dense bilateral airspace opacities, more prominent on the left.  This could reflect atelectasis or pneumonia, on correlation with prior CT; the unusual pattern of air overlying the mediastinum reflects underlying blebs.   Original Report Authenticated By: Tonia Ghent, M.D.   Dg Chest Portable 1 View  09/08/2011  *RADIOLOGY REPORT*  Clinical Data: Decompression of left pneumothorax.  PORTABLE CHEST - 1 VIEW  Comparison: Earlier the same day  Findings: 2325 hours.  Marked thoracolumbar scoliosis distorts the thoracic and cardiomediastinal anatomy.  There is an apparent angiocath or small sheath no superimposed on  the upper left chest wall.  The volume of gas within the left pleural space has decreased in the interval and there is some re-expansion of the left lung.  The left pneumothorax remains large, but there does appear to be less rightward shift of cardiomediastinal structures compared to the previous film.  There is some persistent to probable atelectasis at the right base. Cardiopericardial silhouette may be enlarged, but the distortion by scoliosis makes assessment difficult. Telemetry leads overlie the chest.  IMPRESSION: Interval evacuation of some of the left pleural air with slightly less rightward shift of cardiomediastinal structures although the marked thoracolumbar scoliosis hinders this assessment.  Right base atelectasis.  Original Report Authenticated By: ERIC A. MANSELL, M.D.   Dg Chest Portable 1 View  09/08/2011  *RADIOLOGY REPORT*  Clinical Data: Respiratory distress.  Shortness of breath.  PORTABLE CHEST - 1 VIEW  Comparison: Chest radiograph performed 08/22/2011  Findings: There is complete collapse of the left lung, with rightward shift of the mediastinum, compatible with a tension pneumothorax.  Right basilar airspace opacification likely reflects residual atelectasis.  No definite pleural effusion is identified.  The cardiomediastinal silhouette is difficult to fully assess due to overlying pneumothorax lucency.  No acute osseous abnormalities are identified.  IMPRESSION:  1.  Complete collapse of the left lung, with rightward shift of the mediastinum, compatible with a tension pneumothorax. 2.  Right  basilar airspace opacity likely reflects residual atelectasis.  Critical Value/emergent results were called by telephone at the time of interpretation on 09/08/2011  at 10:58 p.m.  to  Dr. Marliss Czar- Ned Card, who verbally acknowledged these results.  Original Report Authenticated By: Tonia Ghent, M.D.   Dg Chest Port 1 View  08/22/2011  *RADIOLOGY REPORT*  Clinical Data: 46 year old female status post right thoracentesis. Pneumothorax.  PORTABLE CHEST - 1 VIEW  Comparison: 08/20/2011 and earlier.  Findings: Portable upright AP view 0730 hours.  Severe scoliosis. Stable lung volumes.  Mildly improved ventilation at the right lung base with residual confluent probable atelectasis.  No pneumothorax or edema.  Small right pleural effusion.  Stable cardiac size and mediastinal contours.  Visualized tracheal air column is within normal limits.  IMPRESSION: Mildly improved right lung base ventilation.  Residual atelectasis and small effusion.  Original Report Authenticated By: Harley Hallmark, M.D.   Dg Chest Port 1 View  08/17/2011  *RADIOLOGY REPORT*  Clinical Data: ARDS and pleural effusion. Follow-up pneumothorax.  PORTABLE CHEST - 1 VIEW  Comparison: 08/16/2011  Findings: This is a low-volume film with moderate bibasilar atelectasis. A right pleural effusion is again identified. A right IJ central venous catheter with tip overlying the mid SVC again noted. There is no evidence of pneumothorax. There is been little interval change since prior study.  IMPRESSION: Stable chest radiograph.  No evidence of pneumothorax.  Original Report Authenticated By: Rosendo Gros, M.D.   Dg Chest Port 1 View  08/16/2011  *RADIOLOGY REPORT*  Clinical Data: Post right thoracentesis  PORTABLE CHEST - 1 VIEW  Comparison: Chest radiograph 08/16/2011  Findings: Right central venous line is unchanged.  Stable cardiac silhouette.  There is a modest decrease in right pleural effusion. There are low lung volumes.  No pneumothorax  appreciated.  Small left effusion and low lung volumes are stable.  IMPRESSION:  1.  No evidence of pneumothorax following right thoracentesis.  2.  Mild  decrease in pleural effusion.  Original Report Authenticated By: Genevive Bi, M.D.   Dg Chest Port 1 View  08/16/2011  *  RADIOLOGY REPORT*  Clinical Data: Thoracentesis.  PORTABLE CHEST - 1 VIEW  Comparison: 08/16/2011  Findings: Right IJ line tip projects over the SVC.  Very low lung volumes are again noted.  No pneumothorax observed; the previous left apical pneumothorax is no longer readily visualized.  Large right pleural effusion may be mildly reduced in size compared to the prior exam but remains large, with associated passive atelectasis.  There is atelectasis at the left lung base as well.  Cardiac margins are obscured.  IMPRESSION:  1.  Potentially reduced right pleural effusion, although a large pleural effusion persist.  There is associated passive atelectasis. No pneumothorax. 2.  Stable atelectasis at the left lung base.  Original Report Authenticated By: Dellia Cloud, M.D.   Dg Chest Port 1 View  08/16/2011  *RADIOLOGY REPORT*  Clinical Data: Lung disease. Recently intubated patient.  PORTABLE CHEST - 1 VIEW  Comparison:   08/15/2011 and 08/14/2011.  Findings: 0530 hours. There has been apparent interval extubation and removal of the nasogastric tube.  Right IJ central venous catheter is unchanged in the mid SVC.  There are right greater than left pleural effusions with associated bibasilar air space opacities.  The right pleural effusion and basilar pulmonary opacity appear worse. Previously noted left pneumothorax is not well visualized but may have a minimal residual apical component. Heart size and mediastinal contours are stable.  There is a severe convex left thoracolumbar scoliosis.  IMPRESSION: Worsening right pleural effusion and basilar air space disease following extubation. Probable minimal residual left apical  pneumothorax.  Original Report Authenticated By: Gerrianne Scale, M.D.   Dg Chest Port 1 View  08/15/2011  *RADIOLOGY REPORT*  Clinical Data: 46 year old female with ventilation dependent respiratory failure.  Sepsis.  PORTABLE CHEST - 1 VIEW  Comparison: 08/14/2011 and earlier.  Findings: AP portable semi upright view 0601 hours.  Endotracheal tube tip projects between level of the clavicles and carina. Enteric tube is looped in the over the gastric air.  Stable right IJ central line.  Continued lucency in the left hemithorax suspicious for pneumothorax, likely stable.  Right lung base opacity is stable.  Stable cardiac size and mediastinal contours.  IMPRESSION: 1. Stable lines and tubes. 2.  Persistent left pneumothorax suspected and probably stable in extent. 3. Stable right lung pneumonia with some superimposed pleural fluid.  Original Report Authenticated By: Harley Hallmark, M.D.   Dg Chest Port 1 View  08/15/2011  *RADIOLOGY REPORT*  Clinical Data: Evaluate left-sided pneumothorax.  PORTABLE CHEST - 1 VIEW  Comparison: 08/14/2011 CT and 08/13/2011 chest x-ray.  Findings: The CT detected left pneumothorax is not as well delineated on the present plain film exam.  The subtle lucency in the left costophrenic angle region may reflect the CT detected pneumothorax.  Bullae/bleb superior medial aspect left lung limiting evaluation in this region for detection of small pneumothorax.  Right base consolidation with loculated hydropneumothorax.  Right central line tip proximal superior vena cava.  Nasogastric tube courses below the diaphragm.  The tip is not included on this exam.  Endotracheal tube tip 7.2 cm above the carina.  IMPRESSION: CT detected left-sided pneumothorax and right-sided hydropneumothorax better demonstrated on the CT and poorly delineated by plain film exam.  Original Report Authenticated By: Fuller Canada, M.D.   Dg Abd Portable 1v  08/14/2011  *RADIOLOGY REPORT*  Clinical Data:  Evaluate for possible ileus.  PORTABLE ABDOMEN - 1 VIEW  Comparison: Abdominal radiograph 08/11/2011.  Findings: A nasogastric tube  is seen coiled within the stomach. There is some gas and stool seen scattered throughout the colon extending to the level of the distal rectum.  No pathologic distension of small bowel is appreciated.  There is a paucity of bowel gas throughout the right side of the abdomen.  IMPRESSION: 1.  Nonspecific bowel gas pattern, as above, not consistent with underlying ileus.  Original Report Authenticated By: Florencia Reasons, M.D.   US Thoracentesis Asp Pleural Space W/img Guide  08/16/2011  *RADIOLOGY REPORT*  Clinical Data:  Right pleural effusion.  Request has been made for diagnostic right-sided thoracentesis.  ULTRASOUND GUIDED right THORACENTESIS  Comparison:  Prior CT imaging of the chest.  An ultrasound guided thoracentesis was thoroughly discussed with the patient and questions answered.  The benefits, risks, alternatives and complications were also discussed.  The patient understands and wishes to proceed with the procedure.  Written consent was obtained.  Ultrasound was performed to localize and mark an adequate pocket of fluid in the right chest.  The area was then prepped and draped in the normal sterile fashion.  1% Lidocaine was used for local anesthesia.  Under ultrasound guidance a 19 gauge Yueh catheter was introduced.  Thoracentesis was performed.  The catheter was removed and a dressing applied.  Complications:  None immediate  Findings: A total of approximately 90 ml of bloody serous fluid was removed. A fluid sample was sent for laboratory analysis.  IMPRESSION: Successful ultrasound guided right thoracentesis yielding 90 ml of pleural fluid.  Read by: Anselm Pancoast, P.A.-C  Original Report Authenticated By: Richarda Overlie, M.D.    Scheduled Meds:    . bisacodyl  5 mg Oral Once  . cefUROXime (ZINACEF)  IV  1.5 g Intravenous 30 min Pre-Op  . clonazePAM  0.5  mg Oral Once  . fentaNYL  75 mcg Transdermal Q72H  . FLUoxetine  60 mg Oral Daily  . heparin  5,000 Units Subcutaneous Q8H  . levETIRAcetam  250 mg Oral Daily  . mulitivitamin with minerals  1 tablet Oral Daily  . oxybutynin  10 mg Oral Daily  . pantoprazole  40 mg Oral Q1200  . simethicone  80 mg Oral QID  . vancomycin  1,000 mg Intravenous 60 min Pre-Op   Continuous Infusions:

## 2011-09-16 NOTE — Anesthesia Postprocedure Evaluation (Signed)
Anesthesia Post Note  Patient: Rhonda Ramos  Procedure(s) Performed: Procedure(s) (LRB): VIDEO ASSISTED THORACOSCOPY (VATS) W/TALC PLEUADESIS (Left)  Anesthesia type: general  Patient location: PACU  Post pain: Pain level controlled  Post assessment: Patient's Cardiovascular Status Stable  Last Vitals:  Filed Vitals:   09/16/11 1645  BP: 139/80  Pulse: 97  Temp:   Resp: 21    Post vital signs: Reviewed and stable  Level of consciousness: sedated  Complications: No apparent anesthesia complications

## 2011-09-16 NOTE — Transfer of Care (Signed)
Immediate Anesthesia Transfer of Care Note  Patient: Rhonda Ramos  Procedure(s) Performed: Procedure(s) (LRB): VIDEO ASSISTED THORACOSCOPY (VATS) W/TALC PLEUADESIS (Left)  Patient Location: PACU  Anesthesia Type: General  Level of Consciousness: awake, alert  and oriented  Airway & Oxygen Therapy: Patient Spontanous Breathing and Patient connected to face mask oxygen  Post-op Assessment: Report given to PACU RN  Post vital signs: Reviewed and stable  Complications: No apparent anesthesia complications

## 2011-09-16 NOTE — Brief Op Note (Signed)
09/08/2011 - 09/16/2011  3:16 PM  PATIENT:  Rhonda Ramos  46 y.o. female  PRE-OPERATIVE DIAGNOSIS:  Left Spontaneous Pneumothorax with Prolonged Air Leak  POST-OPERATIVE DIAGNOSIS:  same  PROCEDURE:  Procedure(s) (LRB):   LEFT VIDEO ASSISTED THORACOSCOPY  STAPLING OF BLEBS PARIETAL PLEURECTOMY MECHANICAL PLEURODESIS  SURGEON:  Surgeon(s) and Role:    * Purcell Nails, MD - Primary  PHYSICIAN ASSISTANT: Erin Barrett PA-C  ANESTHESIA:   general  EBL:  Total I/O In: 2000 [I.V.:2000] Out: 750 [Urine:550; Blood:200]  BLOOD ADMINISTERED:none  DRAINS: 2 chest tubes in the left chest   LOCAL MEDICATIONS USED:  NONE  SPECIMEN:  Source of Specimen:  LUL Blebs  DISPOSITION OF SPECIMEN:  PATHOLOGY  COUNTS:  YES  PLAN OF CARE: Admit to inpatient   PATIENT DISPOSITION:  PACU - hemodynamically stable.   PATIENT IS NOT A CANDIDATE FOR PHARMACOLOGIC ANTICOAGULATION DUE TO RISK OF BLEEDING  Rhonda Ramos 09/16/2011 3:22 PM

## 2011-09-16 NOTE — Op Note (Signed)
CARDIOTHORACIC SURGERY OPERATIVE NOTE  Date of Procedure:  09/16/2011  Preoperative Diagnosis: Left Spontaneous Pneumothorax with Prolonged Air Leak  Postoperative Diagnosis: Same  Procedure:   Left Video Assisted Thoracoscopy for Stapling of Blebs, Apical Pleurectomy and Mechanical Pleurodesis  Surgeon:   Salvatore Decent. Cornelius Moras, MD  Assistant:   Lowella Dandy, PA-C  Anesthesia:   Bedelia Person, MD    DETAILS OF THE OPERATIVE PROCEDURE  Following full informed consent the patient was Brought to the operating room and placed in the supine position on the operative table. General endotracheal anesthesia is induced uneventfully. The patient is intubated using a dual lumen endotracheal tube. A Foley catheter is placed. Pneumatic sequential compression boots are placed on both lower extremities. Intravenous and box are administered. The patient is turned to the right lateral decubitus position using an axillary roll and pneumatic beanbag device. The patient's existing left chest tube is removed. The patient's left chest was prepared and draped in a sterile manner.  A time out procedure is performed. A 10 mm port is placed directly through the old chest tube hole in the left anterolateral chest wall. The thoracoscopic camera is passed through the port and the left chest was explored visually. The lung is collapsed with minimal adhesions. There is an obvious giant bleb which is hyperinflated anteriorly and medially in the left upper lobe. 2 additional port incisions are made including 1 posteriorly through the sixth intercostal space and a second anteriorly through the seventh intercostal space. Through the combination of the 3 ports, video-assisted thoracoscopy is utilized to complete the remainder of the procedure as described. The left upper lobe is mobilized in one can appreciate a collapsed giant bleb located just medially and inferiorly to the hyperinflated bleb in the left upper lobe. This is presumably  the culprit associated with the patient's spontaneous pneumothorax and prolonged air leak. The hyperinflated giant bleb is popped using electrocautery to allow it to deflate to facilitate mobilization. Both time blebs are resected using endoscopic GIA stapling device with pericardial strips to buttress the staple lines. The lung is mobilized circumferentially and carefully examined for signs of additional blebs. There is a third small bleb located adjacent to the other 2 previously resected blebs and a third small resection is performed. All 3 specimens are sent to pathology.  Apical pleurectomy was performed by bluntly dissecting the parietal pleura off of the chest wall circumferentially. A cautery scratch pad is then utilized to mechanically debride remainder of the chest wall and the dome of the left diaphragm. There was considerable oozing of blood circumferentially due to the aggressive mechanical pleurocentesis. As a result, pharmacologic anticoagulation is felt to be contraindicated during the early postoperative period.  The left pleural space was drained using two 36 French chest tubes placed through 2 of the port incisions. The remaining port incision is closed in multiple layers. The chest tubes are fixed to close suction drainage device and dual lung ventilation was resumed. There does not appear to be any significant air leak.  The patient tolerated the procedure well and was transported to the postanesthesia care unit in stable condition. There are no intraoperative complications.    Salvatore Decent. Cornelius Moras, MD 09/16/2011 3:53 PM

## 2011-09-16 NOTE — Significant Event (Signed)
1725pm-pt admitted to 2302 from PACU; traveled via bed. Upon skin assessment, noted that patient had 2 fentanyl patches on right upper arm, in each patch. Pt arrived to unit also with incontinent diaper, which patient refuses staff to remove. Pt did not want to move extremities, c/o pain whenever she moves. No personal belongings at bedside. Will monitor. Jamesmichael Shadd, Charity fundraiser.

## 2011-09-16 NOTE — Preoperative (Signed)
Beta Blockers   Reason not to administer Beta Blockers:Not Applicable 

## 2011-09-16 NOTE — Progress Notes (Signed)
Patient ID: Rhonda Ramos, female   DOB: 1965-08-02, 46 y.o.   MRN: [id number] C/o pain BP 133/82  Pulse 106  Temp(Src) 99.1 F (37.3 C) (Oral)  Resp 17  Ht 4\' 9"  (1.448 m)  Wt 105 lb 2.6 oz (47.7 kg)  BMI 22.76 kg/m2  SpO2 95% Ct with minimal drainage Continue current care

## 2011-09-17 ENCOUNTER — Encounter (HOSPITAL_COMMUNITY): Payer: Self-pay | Admitting: Thoracic Surgery (Cardiothoracic Vascular Surgery)

## 2011-09-17 ENCOUNTER — Inpatient Hospital Stay (HOSPITAL_COMMUNITY): Payer: Medicare Other

## 2011-09-17 DIAGNOSIS — J930 Spontaneous tension pneumothorax: Principal | ICD-10-CM

## 2011-09-17 DIAGNOSIS — G8921 Chronic pain due to trauma: Secondary | ICD-10-CM

## 2011-09-17 DIAGNOSIS — F329 Major depressive disorder, single episode, unspecified: Secondary | ICD-10-CM

## 2011-09-17 HISTORY — DX: Spontaneous tension pneumothorax: J93.0

## 2011-09-17 LAB — BASIC METABOLIC PANEL
BUN: 10 mg/dL (ref 6–23)
CO2: 24 mEq/L (ref 19–32)
Chloride: 103 mEq/L (ref 96–112)
Creatinine, Ser: 0.75 mg/dL (ref 0.50–1.10)
Glucose, Bld: 130 mg/dL — ABNORMAL HIGH (ref 70–99)

## 2011-09-17 LAB — CBC
HCT: 32.6 % — ABNORMAL LOW (ref 36.0–46.0)
MCV: 89.6 fL (ref 78.0–100.0)
RBC: 3.64 MIL/uL — ABNORMAL LOW (ref 3.87–5.11)
WBC: 7.9 10*3/uL (ref 4.0–10.5)

## 2011-09-17 LAB — POCT I-STAT 3, ART BLOOD GAS (G3+)
Acid-Base Excess: 1 mmol/L (ref 0.0–2.0)
Bicarbonate: 26.4 mEq/L — ABNORMAL HIGH (ref 20.0–24.0)
O2 Saturation: 94 %
Patient temperature: 98.8
TCO2: 28 mmol/L (ref 0–100)

## 2011-09-17 MED ORDER — CLONAZEPAM 0.5 MG PO TABS
1.0000 mg | ORAL_TABLET | Freq: Two times a day (BID) | ORAL | Status: DC | PRN
  Administered 2011-09-17 – 2011-09-21 (×8): 1 mg via ORAL
  Filled 2011-09-17 (×2): qty 2
  Filled 2011-09-17 (×4): qty 1
  Filled 2011-09-17: qty 2
  Filled 2011-09-17: qty 1
  Filled 2011-09-17 (×2): qty 2

## 2011-09-17 MED ORDER — FENTANYL CITRATE 0.05 MG/ML IJ SOLN
100.0000 ug | INTRAMUSCULAR | Status: DC | PRN
  Administered 2011-09-17 – 2011-09-19 (×18): 100 ug via INTRAVENOUS
  Filled 2011-09-17 (×18): qty 2

## 2011-09-17 MED ORDER — RACEPINEPHRINE HCL 2.25 % IN NEBU
INHALATION_SOLUTION | RESPIRATORY_TRACT | Status: AC
  Filled 2011-09-17: qty 0.5

## 2011-09-17 MED ORDER — ALBUTEROL SULFATE (5 MG/ML) 0.5% IN NEBU
2.5000 mg | INHALATION_SOLUTION | Freq: Three times a day (TID) | RESPIRATORY_TRACT | Status: DC
  Administered 2011-09-17 – 2011-09-21 (×11): 2.5 mg via RESPIRATORY_TRACT
  Filled 2011-09-17 (×11): qty 0.5

## 2011-09-17 MED ORDER — FENTANYL 100 MCG/HR TD PT72
100.0000 ug | MEDICATED_PATCH | TRANSDERMAL | Status: DC
  Administered 2011-09-17: 100 ug via TRANSDERMAL

## 2011-09-17 MED ORDER — ALBUTEROL SULFATE (5 MG/ML) 0.5% IN NEBU: 2.5000 mg | INHALATION_SOLUTION | RESPIRATORY_TRACT | Status: DC | PRN

## 2011-09-17 MED ORDER — FENTANYL 50 MCG/HR TD PT72
100.0000 ug | MEDICATED_PATCH | TRANSDERMAL | Status: DC
  Filled 2011-09-17: qty 2

## 2011-09-17 MED ORDER — MORPHINE SULFATE ER 15 MG PO TBCR
30.0000 mg | EXTENDED_RELEASE_TABLET | Freq: Two times a day (BID) | ORAL | Status: DC
  Administered 2011-09-17 – 2011-09-18 (×4): 30 mg via ORAL
  Filled 2011-09-17 (×4): qty 2

## 2011-09-17 NOTE — Progress Notes (Signed)
Transferred to 3306 via bed. Portable monitor and oxygen on.  No changes.

## 2011-09-17 NOTE — Progress Notes (Addendum)
   CARDIOTHORACIC SURGERY PROGRESS NOTE  1 Day Post-Op  S/P Procedure(s) (LRB): VIDEO ASSISTED THORACOSCOPY (VATS) W/TALC PLEUADESIS (Left)  Subjective: Complains of pain around chest tubes.  Very high tolerance of narcotics.  Objective: Vital signs in last 24 hours: Temp:  [98.5 F (36.9 C)-99.2 F (37.3 C)] 99.2 F (37.3 C) (05/15 0729) Pulse Rate:  [97-115] 112  (05/15 0800) Cardiac Rhythm:  [-] Sinus tachycardia (05/15 0800) Resp:  [12-27] 24  (05/15 0800) BP: (82-148)/(53-112) 99/66 mmHg (05/15 0800) SpO2:  [89 %-100 %] 98 % (05/15 0800) Arterial Line BP: (63-170)/(57-89) 66/57 mmHg (05/15 0800) Weight:  [45.8 kg (100 lb 15.5 oz)] 45.8 kg (100 lb 15.5 oz) (05/15 0600)  Physical Exam:  Rhythm:   sinus  Breath sounds: shallow  Heart sounds:  RRR  Incisions:  Dressing dry  Abdomen:  soft  Extremities:  warm  Chest tube(s):  No air leak   Intake/Output from previous day: 05/14 0701 - 05/15 0700 In: 3810 [P.O.:360; I.V.:2850; IV Piggyback:600] Out: 3140 [Urine:2555; Blood:200; Chest Tube:385] Intake/Output this shift: Total I/O In: 170 [P.O.:120; I.V.:50] Out: 120 [Urine:120]  Lab Results:  Basename 09/17/11 0500  WBC 7.9  HGB 10.8*  HCT 32.6*  PLT 339   BMET:  Basename 09/17/11 0500  NA 137  K 3.9  CL 103  CO2 24  GLUCOSE 130*  BUN 10  CREATININE 0.75  CALCIUM 9.7    CBG (last 3)  No results found for this basename: GLUCAP:3 in the last 72 hours  CXR:  *RADIOLOGY REPORT*  Clinical Data: Chest tubes.  PORTABLE CHEST - 1 VIEW  Comparison: Chest 09/16/2011 and 09/15/2011.  Findings: Two left chest tubes remain in place. There may be a  tiny left apical pneumothorax. Subcutaneous air along the left  chest wall is noted. Left basilar atelectasis has improved. Right  basilar atelectasis is increased. Heart size normal.  IMPRESSION:  1. Two left chest tubes in place. There may be a tiny left apical  pneumothorax.  2. Improved left basilar airspace  disease.  3. Increased right basilar atelectasis.  Original Report Authenticated By: Bernadene Bell. Maricela Curet, M.D.   Assessment/Plan: S/P Procedure(s) (LRB): VIDEO ASSISTED THORACOSCOPY (VATS) W/TALC PLEUADESIS (Left)  Doing well POD1 other than pain control.  Doesn't want PCA.  Will try MS contin  Mobilize D/C aline D/C foley Decrease IVF Transfer step down  Jaelyn Bourgoin H 09/17/2011 9:03 AM

## 2011-09-17 NOTE — Progress Notes (Signed)
09-17-11 1016 Rhonda Ramos, [place] [phone] Pt was discussed in long lenth of stay meeting today.

## 2011-09-17 NOTE — Progress Notes (Signed)
[facility] PROGRESS NOTE  Rhonda Ramos:[id number] DOB: Feb 23, 1966 DOA: 09/08/2011   Assessment/Plan: Patient Active Hospital Problem List: Tension pneumothorax (09/09/2011) -5/6 L chest tube. -cont CT per CVTS,  -CXR: residual pneumothorax seen along the left heart border  -pain management.  - VATS 09/16/11   DEPRESSION (11/13/2009)  -continue home SSRI  And clonazepam prn  Chronic pain syndrome (11/13/2009) -continued home fentanyl patch and fentanyl prn  - started on MS contin by TCTS on 09/17/11 postop   GERD   ppi   Seizure (08/28/2010) -home keppra  Pneumonia (08/11/2011) - Avelox 5/9 - 5/11 -afebrile, no leukocytosis. Stoped abx as there is no convincing evidence for infection on cxr Total days on abx 5  Code Status: Full code Family Communication: Mother [phone] Disposition Plan: home  Rhonda Ramos,   [facility] Pager [phone]  If 7PM-7AM, please contact night-coverage [email or web address] Password [email or web address] 09/17/2011, 2:22 PM   LOS: 9 days   Lines / Drains:  5/6 L chest tube (perCVTS) >>   Cultures / Sepsis markers:  5/7 blood cx >>   Antibiotics:  5/7 vanc (HCAP) >> 5/9  5/7 zosyn (HCAP) >> 5/9  Avelox 5/9 >> 5/11   Subjective: Severe Left sided chest pain every time she moves  Objective: Filed Vitals:   09/17/11 1138 09/17/11 1142 09/17/11 1200 09/17/11 1300  BP:   93/63 93/57  Pulse:   107 112  Temp: 98.9 F (37.2 C)     TempSrc: Oral     Resp:   21 23  Height:      Weight:      SpO2:  91% 98% 97%    Intake/Output Summary (Last 24 hours) at 09/17/11 1422 Last data filed at 09/17/11 1400  Gross per 24 hour  Intake   4440 ml  Output   4135 ml  Net    305 ml   Weight change: -1.9 kg (-4 lb 3 oz)  Exam:  General: Alert, awake, oriented x3, in no acute distress.  HEENT: No bruits, no goiter.  Heart: Regular rate and rhythm, without murmurs, rubs, gallops.  Lungs: Good air movement, bilateral air  movement. Chest tube on the left side. Abdomen: Soft, nontender, nondistended, positive bowel sounds.     Data Reviewed: Basic Metabolic Panel:  Lab 09/17/11 9528 09/12/11 0550 09/11/11 0450  NA 137 141 137  K 3.9 3.3* --  CL 103 106 107  CO2 24 25 19   GLUCOSE 130* 102* 88  BUN 10 11 9   CREATININE 0.75 0.79 0.71  CALCIUM 9.7 10.0 9.4  MG -- -- 1.7  PHOS -- -- 3.3   Liver Function Tests: No results found for this basename: AST:5,ALT:5,ALKPHOS:5,BILITOT:5,PROT:5,ALBUMIN:5 in the last 168 hours No results found for this basename: LIPASE:5,AMYLASE:5 in the last 168 hours No results found for this basename: AMMONIA:5 in the last 168 hours CBC:  Lab 09/17/11 0500 09/12/11 0550  WBC 7.9 7.1  NEUTROABS -- --  HGB 10.8* 11.7*  HCT 32.6* 35.3*  MCV 89.6 91.5  PLT 339 222   Cardiac Enzymes: No results found for this basename: CKTOTAL:5,CKMB:5,CKMBINDEX:5,TROPONINI:5 in the last 168 hours BNP: No components found with this basename: POCBNP:5 CBG: No results found for this basename: GLUCAP:5 in the last 168 hours  Recent Results (from the past 240 hour(s))  CULTURE, BLOOD (ROUTINE X 2)     Status: Normal   Collection Time   09/09/11  2:10 AM      Component Value Range Status  Comment   Specimen Description BLOOD LEFT HAND   Final    Special Requests BOTTLES DRAWN AEROBIC ONLY 5CC   Final    Culture  Setup Time [id number]   Final    Culture NO GROWTH 5 DAYS   Final    Report Status 09/15/2011 FINAL   Final   CULTURE, BLOOD (ROUTINE X 2)     Status: Normal   Collection Time   09/09/11  2:10 AM      Component Value Range Status Comment   Specimen Description BLOOD LEFT ARM   Final    Special Requests BOTTLES DRAWN AEROBIC AND ANAEROBIC 10CC EA   Final    Culture  Setup Time [id number]   Final    Culture NO GROWTH 5 DAYS   Final    Report Status 09/15/2011 FINAL   Final   MRSA PCR SCREENING     Status: Normal   Collection Time   09/09/11  2:35 AM      Component Value Range  Status Comment   MRSA by PCR NEGATIVE  NEGATIVE  Final   SURGICAL PCR SCREEN     Status: Normal   Collection Time   09/16/11  2:44 AM      Component Value Range Status Comment   MRSA, PCR NEGATIVE  NEGATIVE  Final    Staphylococcus aureus NEGATIVE  NEGATIVE  Final      Studies: Dg Chest 2 View  08/20/2011  *RADIOLOGY REPORT*  Clinical Data: Dyspnea  CHEST - 2 VIEW  Comparison: 08/17/2011  Findings: Removal of the right IJ central line.  Some interval improvement in bibasilar airspace disease/atelectasis.  Small right effusion remains.  No pneumothorax evident. Marked scoliosis of the spine.  IMPRESSION: Improving bibasilar airspace disease/atelectasis.  Persistent small right effusion.  Original Report Authenticated By: Judie Petit. Ruel Favors, M.D.   Ct Chest Wo Contrast  08/14/2011  *RADIOLOGY REPORT*  Clinical Data: History of right pleural effusion.  Question loculation.  Pneumothorax.  CT CHEST WITHOUT CONTRAST  Technique:  Multidetector CT imaging of the chest was performed following the standard protocol without IV contrast.  Comparison: Multiple prior chest radiographs and CT chest 08/06/2011.  Findings: Mediastinal lymph nodes are not enlarged by CT size criteria.  Hilar regions are difficult to definitively evaluate without IV contrast.  No axillary adenopathy.  Endotracheal tube is in satisfactory position.  Heart size normal.  No pericardial effusion.  There is a partially loculated small to moderate right pleural effusion with collapse/consolidation in the right middle and right lower lobes.  Postoperative changes are seen in the medial aspect of the lower right hemithorax, adjacent to focal areas of lucency (images 14 to 26).  Lucencies correspond to blebs and pleural air on 08/06/2011.  Patchy ground-glass is seen diffusely.  Blebs and small left pneumothorax in the left hemithorax.  Airspace consolidation in the lingula and left lower lobe.  Incidental imaging of the upper abdomen shows no  acute findings. No worrisome lytic or sclerotic lesions.  IMPRESSION:  1.  New partially loculated small to moderate right pleural effusion, worrisome for empyema. 2.  Complete or near complete resolution of right pneumothorax, with a small amount of pleural air and/or blebs in the medial aspect of the lower right hemithorax, as before. 3.  Multilobar collapse/consolidation and ground-glass, most indicative of pneumonia. 4.  Small left pneumothorax.  Original Report Authenticated By: Reyes Ivan, M.D.   Dg Chest Portable 1 View  09/12/2011  *RADIOLOGY REPORT*  Clinical  Data: Follow-up left pneumothorax.  PORTABLE CHEST - 1 VIEW  Comparison: 09/11/2011  Findings: Low lung volumes are again demonstrated with persistent bibasilar subsegmental atelectasis.  Left chest tube remains in place, and a tiny residual pneumothorax is seen along the left heart border.  Subcutaneous emphysema again seen in the left chest wall soft tissues.  Heart size remains normal.  IMPRESSION:  1.  Tiny residual pneumothorax seen along the left heart border. 2.  No significant change in bilateral subsegmental atelectasis.  Original Report Authenticated By: Danae Orleans, M.D.   Dg Chest Port 1 View  09/11/2011  *RADIOLOGY REPORT*  Clinical Data: Left chest tube, shortness of breath.  PORTABLE CHEST - 1 VIEW  Comparison: 09/10/2011.  Findings: Trachea is midline.  Heart size is grossly stable.  Left chest tube is in place.  No definite pneumothorax.  Subcutaneous emphysema is seen along the left neck and left chest wall, but has improved slightly in the interval.  Lungs are very low in volume with bibasilar air space disease, as before.  IMPRESSION:  1.  No definite pneumothorax with left chest tube in place. Resolving subcutaneous emphysema. 2.  Low lung volumes with bibasilar air space disease, as before.  Original Report Authenticated By: Reyes Ivan, M.D.   Dg Chest Port 1 View  09/10/2011  *RADIOLOGY REPORT*  Clinical  Data: Follow up pneumothorax, evaluate chest.  PORTABLE CHEST - 1 VIEW  Comparison: Portable chest x-ray of 09/08/2011  Findings: The left chest tube remains extending to the left lung apex with the tip medially positioned.  No definite pneumothorax is seen.  Basilar opacities have improved slightly consistent with residual atelectasis left greater than right.  Mild cardiomegaly is stable.  Left chest wall subcutaneous air again is noted.  IMPRESSION: No change in medially positioned left apical chest tube with no definite pneumothorax.  Slightly better aeration.  Original Report Authenticated By: Juline Patch, M.D.   Dg Chest Portable 1 View  09/09/2011  *RADIOLOGY REPORT*  Clinical Data: Status post left-sided chest tube placement for pneumothorax; respiratory distress.  PORTABLE CHEST - 1 VIEW  Comparison: Chest radiograph performed earlier today at 11:25 p.m.  Findings: The lungs are significantly hypoexpanded.  There has been interval placement of a left apical chest tube, with apparent re- expansion of the left lung.  No definite pneumothorax is now seen. However, the lungs are not well assessed due to the degree of hypoexpansion.  Bilateral airspace opacities could reflect atelectasis or pneumonia, on comparison with prior CT from 08/14/2011.  The unusual pattern of air overlying the mediastinum reflects underlying blebs.  The mediastinum is not well assessed due to adjacent consolidation. No acute osseous abnormalities are identified.  IMPRESSION:  1.  No definite evidence of pneumothorax, status post placement of left apical chest tube. 2.  The lungs are markedly hypoexpanded, with dense bilateral airspace opacities, more prominent on the left.  This could reflect atelectasis or pneumonia, on correlation with prior CT; the unusual pattern of air overlying the mediastinum reflects underlying blebs.  Original Report Authenticated By: Tonia Ghent, M.D.   Dg Chest Portable 1 View  09/08/2011  *RADIOLOGY  REPORT*  Clinical Data: Decompression of left pneumothorax.  PORTABLE CHEST - 1 VIEW  Comparison: Earlier the same day  Findings: 2325 hours.  Marked thoracolumbar scoliosis distorts the thoracic and cardiomediastinal anatomy.  There is an apparent angiocath or small sheath no superimposed on the upper left chest wall.  The volume of gas within  the left pleural space has decreased in the interval and there is some re-expansion of the left lung.  The left pneumothorax remains large, but there does appear to be less rightward shift of cardiomediastinal structures compared to the previous film.  There is some persistent to probable atelectasis at the right base. Cardiopericardial silhouette may be enlarged, but the distortion by scoliosis makes assessment difficult. Telemetry leads overlie the chest.  IMPRESSION: Interval evacuation of some of the left pleural air with slightly less rightward shift of cardiomediastinal structures although the marked thoracolumbar scoliosis hinders this assessment.  Right base atelectasis.  Original Report Authenticated By: ERIC A. MANSELL, M.D.   Dg Chest Portable 1 View  09/08/2011  *RADIOLOGY REPORT*  Clinical Data: Respiratory distress.  Shortness of breath.  PORTABLE CHEST - 1 VIEW  Comparison: Chest radiograph performed 08/22/2011  Findings: There is complete collapse of the left lung, with rightward shift of the mediastinum, compatible with a tension pneumothorax.  Right basilar airspace opacification likely reflects residual atelectasis.  No definite pleural effusion is identified.  The cardiomediastinal silhouette is difficult to fully assess due to overlying pneumothorax lucency.  No acute osseous abnormalities are identified.  IMPRESSION:  1.  Complete collapse of the left lung, with rightward shift of the mediastinum, compatible with a tension pneumothorax. 2.  Right basilar airspace opacity likely reflects residual atelectasis.  Critical Value/emergent results were called  by telephone at the time of interpretation on 09/08/2011  at 10:58 p.m.  to  Dr. Marliss Czar- Ned Card, who verbally acknowledged these results.  Original Report Authenticated By: Tonia Ghent, M.D.   Dg Chest Port 1 View  08/22/2011  *RADIOLOGY REPORT*  Clinical Data: 46 year old female status post right thoracentesis. Pneumothorax.  PORTABLE CHEST - 1 VIEW  Comparison: 08/20/2011 and earlier.  Findings: Portable upright AP view 0730 hours.  Severe scoliosis. Stable lung volumes.  Mildly improved ventilation at the right lung base with residual confluent probable atelectasis.  No pneumothorax or edema.  Small right pleural effusion.  Stable cardiac size and mediastinal contours.  Visualized tracheal air column is within normal limits.  IMPRESSION: Mildly improved right lung base ventilation.  Residual atelectasis and small effusion.  Original Report Authenticated By: Harley Hallmark, M.D.   Dg Chest Port 1 View  08/17/2011  *RADIOLOGY REPORT*  Clinical Data: ARDS and pleural effusion. Follow-up pneumothorax.  PORTABLE CHEST - 1 VIEW  Comparison: 08/16/2011  Findings: This is a low-volume film with moderate bibasilar atelectasis. A right pleural effusion is again identified. A right IJ central venous catheter with tip overlying the mid SVC again noted. There is no evidence of pneumothorax. There is been little interval change since prior study.  IMPRESSION: Stable chest radiograph.  No evidence of pneumothorax.  Original Report Authenticated By: Rosendo Gros, M.D.   Dg Chest Port 1 View  08/16/2011  *RADIOLOGY REPORT*  Clinical Data: Post right thoracentesis  PORTABLE CHEST - 1 VIEW  Comparison: Chest radiograph 08/16/2011  Findings: Right central venous line is unchanged.  Stable cardiac silhouette.  There is a modest decrease in right pleural effusion. There are low lung volumes.  No pneumothorax appreciated.  Small left effusion and low lung volumes are stable.  IMPRESSION:  1.  No evidence of pneumothorax  following right thoracentesis.  2.  Mild  decrease in pleural effusion.  Original Report Authenticated By: Genevive Bi, M.D.   Dg Chest Port 1 View  08/16/2011  *RADIOLOGY REPORT*  Clinical Data: Thoracentesis.  PORTABLE CHEST -  1 VIEW  Comparison: 08/16/2011  Findings: Right IJ line tip projects over the SVC.  Very low lung volumes are again noted.  No pneumothorax observed; the previous left apical pneumothorax is no longer readily visualized.  Large right pleural effusion may be mildly reduced in size compared to the prior exam but remains large, with associated passive atelectasis.  There is atelectasis at the left lung base as well.  Cardiac margins are obscured.  IMPRESSION:  1.  Potentially reduced right pleural effusion, although a large pleural effusion persist.  There is associated passive atelectasis. No pneumothorax. 2.  Stable atelectasis at the left lung base.  Original Report Authenticated By: Dellia Cloud, M.D.   Dg Chest Port 1 View  08/16/2011  *RADIOLOGY REPORT*  Clinical Data: Lung disease. Recently intubated patient.  PORTABLE CHEST - 1 VIEW  Comparison:   08/15/2011 and 08/14/2011.  Findings: 0530 hours. There has been apparent interval extubation and removal of the nasogastric tube.  Right IJ central venous catheter is unchanged in the mid SVC.  There are right greater than left pleural effusions with associated bibasilar air space opacities.  The right pleural effusion and basilar pulmonary opacity appear worse. Previously noted left pneumothorax is not well visualized but may have a minimal residual apical component. Heart size and mediastinal contours are stable.  There is a severe convex left thoracolumbar scoliosis.  IMPRESSION: Worsening right pleural effusion and basilar air space disease following extubation. Probable minimal residual left apical pneumothorax.  Original Report Authenticated By: Gerrianne Scale, M.D.   Dg Chest Port 1 View  08/15/2011  *RADIOLOGY  REPORT*  Clinical Data: 46 year old female with ventilation dependent respiratory failure.  Sepsis.  PORTABLE CHEST - 1 VIEW  Comparison: 08/14/2011 and earlier.  Findings: AP portable semi upright view 0601 hours.  Endotracheal tube tip projects between level of the clavicles and carina. Enteric tube is looped in the over the gastric air.  Stable right IJ central line.  Continued lucency in the left hemithorax suspicious for pneumothorax, likely stable.  Right lung base opacity is stable.  Stable cardiac size and mediastinal contours.  IMPRESSION: 1. Stable lines and tubes. 2.  Persistent left pneumothorax suspected and probably stable in extent. 3. Stable right lung pneumonia with some superimposed pleural fluid.  Original Report Authenticated By: Harley Hallmark, M.D.   Dg Chest Port 1 View  08/15/2011  *RADIOLOGY REPORT*  Clinical Data: Evaluate left-sided pneumothorax.  PORTABLE CHEST - 1 VIEW  Comparison: 08/14/2011 CT and 08/13/2011 chest x-ray.  Findings: The CT detected left pneumothorax is not as well delineated on the present plain film exam.  The subtle lucency in the left costophrenic angle region may reflect the CT detected pneumothorax.  Bullae/bleb superior medial aspect left lung limiting evaluation in this region for detection of small pneumothorax.  Right base consolidation with loculated hydropneumothorax.  Right central line tip proximal superior vena cava.  Nasogastric tube courses below the diaphragm.  The tip is not included on this exam.  Endotracheal tube tip 7.2 cm above the carina.  IMPRESSION: CT detected left-sided pneumothorax and right-sided hydropneumothorax better demonstrated on the CT and poorly delineated by plain film exam.  Original Report Authenticated By: Fuller Canada, M.D.   Dg Abd Portable 1v  08/14/2011  *RADIOLOGY REPORT*  Clinical Data: Evaluate for possible ileus.  PORTABLE ABDOMEN - 1 VIEW  Comparison: Abdominal radiograph 08/11/2011.  Findings: A nasogastric  tube is seen coiled within the stomach. There is some gas  and stool seen scattered throughout the colon extending to the level of the distal rectum.  No pathologic distension of small bowel is appreciated.  There is a paucity of bowel gas throughout the right side of the abdomen.  IMPRESSION: 1.  Nonspecific bowel gas pattern, as above, not consistent with underlying ileus.  Original Report Authenticated By: Florencia Reasons, M.D.   US Thoracentesis Asp Pleural Space W/img Guide  08/16/2011  *RADIOLOGY REPORT*  Clinical Data:  Right pleural effusion.  Request has been made for diagnostic right-sided thoracentesis.  ULTRASOUND GUIDED right THORACENTESIS  Comparison:  Prior CT imaging of the chest.  An ultrasound guided thoracentesis was thoroughly discussed with the patient and questions answered.  The benefits, risks, alternatives and complications were also discussed.  The patient understands and wishes to proceed with the procedure.  Written consent was obtained.  Ultrasound was performed to localize and mark an adequate pocket of fluid in the right chest.  The area was then prepped and draped in the normal sterile fashion.  1% Lidocaine was used for local anesthesia.  Under ultrasound guidance a 19 gauge Yueh catheter was introduced.  Thoracentesis was performed.  The catheter was removed and a dressing applied.  Complications:  None immediate  Findings: A total of approximately 90 ml of bloody serous fluid was removed. A fluid sample was sent for laboratory analysis.  IMPRESSION: Successful ultrasound guided right thoracentesis yielding 90 ml of pleural fluid.  Read by: Anselm Pancoast, P.A.-C  Original Report Authenticated By: Richarda Overlie, M.D.    Scheduled Meds:    . acetaminophen  1,000 mg Intravenous Q6H  . albuterol  2.5 mg Nebulization TID  . bisacodyl  10 mg Oral Daily  . cefUROXime (ZINACEF)  IV  1.5 g Intravenous Q12H  . fentaNYL  100 mcg Transdermal Q72H  . FLUoxetine  60 mg Oral Daily    . HYDROmorphone      . levETIRAcetam  250 mg Oral Daily  . lung surgery book   Does not apply Once  . morphine  30 mg Oral Q12H  . mulitivitamin with minerals  1 tablet Oral Daily  . oxybutynin  10 mg Oral Daily  . pantoprazole  40 mg Oral Q1200  . simethicone  80 mg Oral QID  . vancomycin  1,000 mg Intravenous Q12H  . DISCONTD: albuterol  2.5 mg Nebulization Q4H WA  . DISCONTD: bisacodyl  5 mg Oral Once  . DISCONTD: fentaNYL  100 mcg Transdermal Q72H  . DISCONTD: fentaNYL  75 mcg Transdermal Q72H   Continuous Infusions:    . dextrose 5 % and 0.45 % NaCl with KCl 20 mEq/L 20 mL/hr at 09/17/11 0901  . DISCONTD: lactated ringers 20 mL/hr at 09/16/11 1220

## 2011-09-18 ENCOUNTER — Inpatient Hospital Stay (HOSPITAL_COMMUNITY): Payer: Medicare Other

## 2011-09-18 DIAGNOSIS — G8921 Chronic pain due to trauma: Secondary | ICD-10-CM

## 2011-09-18 DIAGNOSIS — J930 Spontaneous tension pneumothorax: Secondary | ICD-10-CM

## 2011-09-18 DIAGNOSIS — F329 Major depressive disorder, single episode, unspecified: Secondary | ICD-10-CM

## 2011-09-18 DIAGNOSIS — J159 Unspecified bacterial pneumonia: Secondary | ICD-10-CM

## 2011-09-18 LAB — COMPREHENSIVE METABOLIC PANEL
ALT: 23 U/L (ref 0–35)
AST: 17 U/L (ref 0–37)
Albumin: 2.5 g/dL — ABNORMAL LOW (ref 3.5–5.2)
Calcium: 10 mg/dL (ref 8.4–10.5)
Creatinine, Ser: 0.78 mg/dL (ref 0.50–1.10)
Sodium: 134 mEq/L — ABNORMAL LOW (ref 135–145)
Total Protein: 5.9 g/dL — ABNORMAL LOW (ref 6.0–8.3)

## 2011-09-18 LAB — CBC
MCH: 29.5 pg (ref 26.0–34.0)
MCHC: 32.3 g/dL (ref 30.0–36.0)
Platelets: 338 10*3/uL (ref 150–400)
RBC: 3.49 MIL/uL — ABNORMAL LOW (ref 3.87–5.11)
RDW: 15.5 % (ref 11.5–15.5)

## 2011-09-18 MED ORDER — SODIUM CHLORIDE 0.9 % IJ SOLN
INTRAMUSCULAR | Status: AC
  Administered 2011-09-18: 10 mL
  Filled 2011-09-18: qty 20

## 2011-09-18 MED ORDER — OXYCODONE HCL 5 MG PO TABS
15.0000 mg | ORAL_TABLET | ORAL | Status: DC | PRN
  Administered 2011-09-18 – 2011-09-19 (×4): 15 mg via ORAL
  Filled 2011-09-18 (×4): qty 3

## 2011-09-18 NOTE — Progress Notes (Signed)
Paged donielle pa re which chest tube wants removed due to position of tubes. Then called dr Cornelius Moras to confirm which tube to be removed. Rhonda Ramos

## 2011-09-18 NOTE — Progress Notes (Addendum)
[address]            [place] [address]          [phone]     2 Days Post-Op  Procedure(s) (LRB): VIDEO ASSISTED THORACOSCOPY (VATS) W/TALC PLEUADESIS (Left) Subjective: Feels ok, mostly c/o pain from tube sites  Objective  Telemetry sr/stachy  Temp:  [98.5 F (36.9 C)-100.5 F (38.1 C)] 99 F (37.2 C) (05/16 0346) Pulse Rate:  [101-122] 101  (05/16 0346) Resp:  [15-26] 15  (05/16 0346) BP: (84-116)/(57-75) 95/61 mmHg (05/16 0346) SpO2:  [90 %-98 %] 90 % (05/16 0346) Arterial Line BP: (66)/(57) 66/57 mmHg (05/15 0800) FiO2 (%):  [28 %] 28 % (05/16 0346)   Intake/Output Summary (Last 24 hours) at 09/18/11 0730 Last data filed at 09/18/11 0600  Gross per 24 hour  Intake   1670 ml  Output   2545 ml  Net   -875 ml       General appearance: alert, cooperative and no distress Heart: regular rate and rhythm Lungs: dimin in bases Abdomen: soft, nontender Extremities: no edema Wound: dressings intact  Lab Results:  Basename 09/18/11 0415 09/17/11 0500  NA 134* 137  K 4.2 3.9  CL 100 103  CO2 28 24  GLUCOSE 106* 130*  BUN 11 10  CREATININE 0.78 0.75  CALCIUM 10.0 9.7  MG -- --  PHOS -- --    Basename 09/18/11 0415  AST 17  ALT 23  ALKPHOS 62  BILITOT 0.3  PROT 5.9*  ALBUMIN 2.5*   No results found for this basename: LIPASE:2,AMYLASE:2 in the last 72 hours  Basename 09/18/11 0415 09/17/11 0500  WBC 10.8* 7.9  NEUTROABS -- --  HGB 10.3* 10.8*  HCT 31.9* 32.6*  MCV 91.4 89.6  PLT 338 339   No results found for this basename: CKTOTAL:4,CKMB:4,TROPONINI:4 in the last 72 hours No components found with this basename: POCBNP:3 No results found for this basename: DDIMER in the last 72 hours No results found for this basename: HGBA1C in the last 72 hours No results found for this basename: CHOL,HDL,LDLCALC,TRIG,CHOLHDL in the last 72 hours No results found for this basename: TSH,T4TOTAL,FREET3,T3FREE,THYROIDAB in  the last 72 hours No results found for this basename: VITAMINB12,FOLATE,FERRITIN,TIBC,IRON,RETICCTPCT in the last 72 hours  Medications: Scheduled    . acetaminophen  1,000 mg Intravenous Q6H  . albuterol  2.5 mg Nebulization TID  . bisacodyl  10 mg Oral Daily  . cefUROXime (ZINACEF)  IV  1.5 g Intravenous Q12H  . fentaNYL  100 mcg Transdermal Q72H  . FLUoxetine  60 mg Oral Daily  . levETIRAcetam  250 mg Oral Daily  . morphine  30 mg Oral Q12H  . mulitivitamin with minerals  1 tablet Oral Daily  . oxybutynin  10 mg Oral Daily  . pantoprazole  40 mg Oral Q1200  . simethicone  80 mg Oral QID  . sodium chloride      . DISCONTD: albuterol  2.5 mg Nebulization Q4H WA  . DISCONTD: fentaNYL  100 mcg Transdermal Q72H  . DISCONTD: fentaNYL  75 mcg Transdermal Q72H     Radiology/Studies:  Dg Chest Port 1 View  09/17/2011  *RADIOLOGY REPORT*  Clinical Data: Chest tubes.  PORTABLE CHEST - 1 VIEW  Comparison: Chest 09/16/2011 and 09/15/2011.  Findings: Two left chest tubes remain in place.  There may be a tiny left apical  pneumothorax.  Subcutaneous air along the left chest wall is noted.  Left basilar atelectasis has improved.  Right basilar atelectasis is increased.  Heart size normal.  IMPRESSION:  1.  Two left chest tubes in place.  There may be a tiny left apical pneumothorax. 2.  Improved left basilar airspace disease. 3.  Increased right basilar atelectasis.  Original Report Authenticated By: Bernadene Bell. D'ALESSIO, M.D.   Dg Chest Portable 1 View  09/16/2011  *RADIOLOGY REPORT*  Clinical Data: Postop left VATS.  Left chest tubes in place.  PORTABLE CHEST - 1 VIEW hours:  Comparison: Portable chest x-ray yesterday and dating back to 09/10/2011.  Findings: 2 left chest tubes in place with small (5% or so) left apical pneumothorax, post VATS presumably for bullectomy, as the large bleb identified on the prior examinations is no longer seen. Markedly suboptimal inspiration with worsening  atelectasis in the lung bases.  Right lung otherwise clear.  Right jugular central venous catheter tip in the SVC.  Subcutaneous emphysema in the left chest wall and neck, unchanged.  IMPRESSION:  1.  Small (5% or so) left apical pneumothorax with two chest tubes in place. 2.  Markedly suboptimal inspiration accounts for worsening bibasilar atelectasis, right greater than left. 3.  Stable small amount of subcutaneous emphysema in the left chest wall and neck.  Original Report Authenticated By: Arnell Sieving, M.D.   Chest tube - no air leak CXR - no def pneumo. , aeration somewhat improved  INR: Will add last result for INR, ABG once components are confirmed Will add last 4 CBG results once components are confirmed  Assessment/Plan: S/P Procedure(s) (LRB): VIDEO ASSISTED THORACOSCOPY (VATS) W/TALC PLEUADESIS (Left)  1 steady progress, poss d/c one chest tube today, push pulm toilet as able 2 labs stable 3 rehab 4 analgesics  GOLD,WAYNE E 5/16/20137:30 AM   I have seen and examined the patient and agree with the assessment and plan as outlined.  D/C anterior tube and place remaining tube to water seal.  Leave foley because of chronic bladder dysfunction.  Mobilize as much as possible.  Deveney Bayon H 09/18/2011 7:55 AM

## 2011-09-18 NOTE — Progress Notes (Signed)
[facility] PROGRESS NOTE  Rhonda Ramos YNW:[id number] DOB: 1966/03/23 DOA: 09/08/2011   Assessment/Plan: Patient Active Hospital Problem List: Spontaneous tension pneumothorax (09/17/2011) -5/6  chest tube. removed on 09/18/2011 -cont CT per CVTS,  -CXR: residual pneumothorax seen along the left heart border  -pain management.  - VATS 09/16/11,   DEPRESSION (11/13/2009) -continue home SSRI And clonazepam prn  Chronic pain syndrome (11/13/2009) -continued home fentanyl patch and fentanyl prn add oxy IR. -started on MS contin by TCTS on 09/17/11 postop  Seizure (08/28/2010) -home keppra  Pneumonia (08/11/2011) -avelox 5/9-5/11 -aebrile.    Code Status: Full code  Family Communication: Mother [phone]  Disposition Plan: home  Lambert Keto, MD  [facility] Pager [phone]  If 7PM-7AM, please contact night-coverage [email or web address] Password [facility] 09/18/2011, 8:53 AM   LOS: 10 days   Procedures: 5/6 L chest tube (perCVTS) >> 09/18/2011   Antibiotics: 5/7 vanc (HCAP) >> 5/9  5/7 zosyn (HCAP) >> 5/9  Avelox 5/9 >> 5/11  Subjective: Complaining of pain. She has a high tolerance for narcotics.  Objective: Filed Vitals:   09/18/11 4401 09/18/11 0734 09/18/11 0752 09/18/11 0834  BP: 104/63     Pulse: 108  104   Temp:  98.2 F (36.8 C)    TempSrc:      Resp: 15  19   Height:      Weight:      SpO2: 96%  97% 95%    Intake/Output Summary (Last 24 hours) at 09/18/11 0853 Last data filed at 09/18/11 0800  Gross per 24 hour  Intake   1560 ml  Output   2875 ml  Net  -1315 ml   Weight change:   Exam:  General: Alert, awake, oriented x3, in no acute distress.  HEENT: No bruits, no goiter.  Heart: Regular rate and tachycardic, without murmurs, rubs, gallops.  Lungs: Good air movement, bilateral air movement. Crackles on the right and left Chest tube on the left side. Abdomen: Soft, nontender, nondistended, positive bowel sounds.  Neuro:  Grossly intact, nonfocal.   Data Reviewed: Basic Metabolic Panel:  Lab 09/18/11 0272 09/17/11 0500 09/12/11 0550  NA 134* 137 141  K 4.2 3.9 --  CL 100 103 106  CO2 28 24 25   GLUCOSE 106* 130* 102*  BUN 11 10 11   CREATININE 0.78 0.75 0.79  CALCIUM 10.0 9.7 10.0  MG -- -- --  PHOS -- -- --   Liver Function Tests:  Lab 09/18/11 0415  AST 17  ALT 23  ALKPHOS 62  BILITOT 0.3  PROT 5.9*  ALBUMIN 2.5*   No results found for this basename: LIPASE:5,AMYLASE:5 in the last 168 hours No results found for this basename: AMMONIA:5 in the last 168 hours CBC:  Lab 09/18/11 0415 09/17/11 0500 09/12/11 0550  WBC 10.8* 7.9 7.1  NEUTROABS -- -- --  HGB 10.3* 10.8* 11.7*  HCT 31.9* 32.6* 35.3*  MCV 91.4 89.6 91.5  PLT 338 339 222   Cardiac Enzymes: No results found for this basename: CKTOTAL:5,CKMB:5,CKMBINDEX:5,TROPONINI:5 in the last 168 hours BNP: No components found with this basename: POCBNP:5 CBG: No results found for this basename: GLUCAP:5 in the last 168 hours  Recent Results (from the past 240 hour(s))  CULTURE, BLOOD (ROUTINE X 2)     Status: Normal   Collection Time   09/09/11  2:10 AM      Component Value Range Status Comment   Specimen Description BLOOD LEFT HAND   Final    Special  Requests BOTTLES DRAWN AEROBIC ONLY 5CC   Final    Culture  Setup Time [id number]   Final    Culture NO GROWTH 5 DAYS   Final    Report Status 09/15/2011 FINAL   Final   CULTURE, BLOOD (ROUTINE X 2)     Status: Normal   Collection Time   09/09/11  2:10 AM      Component Value Range Status Comment   Specimen Description BLOOD LEFT ARM   Final    Special Requests BOTTLES DRAWN AEROBIC AND ANAEROBIC 10CC EA   Final    Culture  Setup Time [id number]   Final    Culture NO GROWTH 5 DAYS   Final    Report Status 09/15/2011 FINAL   Final   MRSA PCR SCREENING     Status: Normal   Collection Time   09/09/11  2:35 AM      Component Value Range Status Comment   MRSA by PCR NEGATIVE   NEGATIVE  Final   SURGICAL PCR SCREEN     Status: Normal   Collection Time   09/16/11  2:44 AM      Component Value Range Status Comment   MRSA, PCR NEGATIVE  NEGATIVE  Final    Staphylococcus aureus NEGATIVE  NEGATIVE  Final      Studies: Dg Chest 2 View  08/20/2011  *RADIOLOGY REPORT*  Clinical Data: Dyspnea  CHEST - 2 VIEW  Comparison: 08/17/2011  Findings: Removal of the right IJ central line.  Some interval improvement in bibasilar airspace disease/atelectasis.  Small right effusion remains.  No pneumothorax evident. Marked scoliosis of the spine.  IMPRESSION: Improving bibasilar airspace disease/atelectasis.  Persistent small right effusion.  Original Report Authenticated By: Judie Petit. Ruel Favors, M.D.   Dg Chest Port 1 View  09/18/2011  *RADIOLOGY REPORT*  Clinical Data: Shortness of breath, chest tubes  PORTABLE CHEST - 1 VIEW  Comparison: Portable chest x-ray of 09/17/2011  Findings: Two left chest tubes remain and there is little change in aeration with basilar atelectasis bilaterally.  No definite pneumothorax is seen.  There is still a small amount of left chest wall subcutaneous emphysema present.  Right central venous line is unchanged in position.  Heart size is stable.  IMPRESSION: No definite pneumothorax with two left chest tubes remaining. Little change in bibasilar atelectasis.  Original Report Authenticated By: Juline Patch, M.D.   Dg Chest Port 1 View  09/17/2011  *RADIOLOGY REPORT*  Clinical Data: Chest tubes.  PORTABLE CHEST - 1 VIEW  Comparison: Chest 09/16/2011 and 09/15/2011.  Findings: Two left chest tubes remain in place.  There may be a tiny left apical pneumothorax.  Subcutaneous air along the left chest wall is noted.  Left basilar atelectasis has improved.  Right basilar atelectasis is increased.  Heart size normal.  IMPRESSION:  1.  Two left chest tubes in place.  There may be a tiny left apical pneumothorax. 2.  Improved left basilar airspace disease. 3.  Increased  right basilar atelectasis.  Original Report Authenticated By: Bernadene Bell. D'ALESSIO, M.D.   Dg Chest Portable 1 View  09/16/2011  *RADIOLOGY REPORT*  Clinical Data: Postop left VATS.  Left chest tubes in place.  PORTABLE CHEST - 1 VIEW hours:  Comparison: Portable chest x-ray yesterday and dating back to 09/10/2011.  Findings: 2 left chest tubes in place with small (5% or so) left apical pneumothorax, post VATS presumably for bullectomy, as the large bleb identified on the prior examinations is  no longer seen. Markedly suboptimal inspiration with worsening atelectasis in the lung bases.  Right lung otherwise clear.  Right jugular central venous catheter tip in the SVC.  Subcutaneous emphysema in the left chest wall and neck, unchanged.  IMPRESSION:  1.  Small (5% or so) left apical pneumothorax with two chest tubes in place. 2.  Markedly suboptimal inspiration accounts for worsening bibasilar atelectasis, right greater than left. 3.  Stable small amount of subcutaneous emphysema in the left chest wall and neck.  Original Report Authenticated By: Arnell Sieving, M.D.   Dg Chest Port 1 View  09/15/2011  *RADIOLOGY REPORT*  Clinical Data: Left chest tube.  PORTABLE CHEST - 1 VIEW  Comparison: 09/14/2011  Findings: Left chest tube remains in place, unchanged. Subcutaneous air is stable.  No visible pneumothorax.  Bibasilar atelectasis.  Heart is normal size.  Severe thoracolumbar scoliosis.  IMPRESSION: Left chest tube remains in stable position without visible pneumothorax.  Bibasilar atelectasis.  Original Report Authenticated By: Cyndie Chime, M.D.   Dg Chest Port 1 View  09/14/2011  *RADIOLOGY REPORT*  Clinical Data: Left-sided pneumothorax.  Chest tube.  PORTABLE CHEST - 1 VIEW  Comparison: Portable chest 09/13/2011.  Findings: A left-sided chest tube remains.  There is no definite pneumothorax.  Subcutaneous air remains.  Bibasilar airspace disease is not significantly changed.  The lung volumes are  low.  IMPRESSION:  1.  Stable left-sided chest tube without evidence for residual pneumothorax. 2.  Persistent bibasilar airspace disease, likely reflecting atelectasis. 3.  Low lung volumes.  Original Report Authenticated By: Jamesetta Orleans. MATTERN, M.D.   Dg Chest Port 1 View  09/13/2011  *RADIOLOGY REPORT*  Clinical Data: Evaluate left-sided pneumothorax  PORTABLE CHEST - 1 VIEW  Comparison: 09/12/2011  Findings: The heart size appears normal.  No pleural effusion or edema.  There is a left-sided chest tube in place.  Tiny left pneumothorax along the left heart border is again noted and appears improved from previous exam.  There is extensive left chest wall emphysema.  Lung volumes are low.  There is extensive atelectasis and decreased aeration to both lung bases.  IMPRESSION:  1.  Low lung volumes with bibasilar atelectasis. 2.  Tiny residual pneumothorax is seen along the left heart border which appears less conspicuous from previous exam.  Original Report Authenticated By: Rosealee Albee, M.D.   Dg Chest Portable 1 View  09/12/2011  *RADIOLOGY REPORT*  Clinical Data: Follow-up left pneumothorax.  PORTABLE CHEST - 1 VIEW  Comparison: 09/11/2011  Findings: Low lung volumes are again demonstrated with persistent bibasilar subsegmental atelectasis.  Left chest tube remains in place, and a tiny residual pneumothorax is seen along the left heart border.  Subcutaneous emphysema again seen in the left chest wall soft tissues.  Heart size remains normal.  IMPRESSION:  1.  Tiny residual pneumothorax seen along the left heart border. 2.  No significant change in bilateral subsegmental atelectasis.  Original Report Authenticated By: Danae Orleans, M.D.   Dg Chest Port 1 View  09/11/2011  *RADIOLOGY REPORT*  Clinical Data: Left chest tube, shortness of breath.  PORTABLE CHEST - 1 VIEW  Comparison: 09/10/2011.  Findings: Trachea is midline.  Heart size is grossly stable.  Left chest tube is in place.  No definite  pneumothorax.  Subcutaneous emphysema is seen along the left neck and left chest wall, but has improved slightly in the interval.  Lungs are very low in volume with bibasilar air space disease, as  before.  IMPRESSION:  1.  No definite pneumothorax with left chest tube in place. Resolving subcutaneous emphysema. 2.  Low lung volumes with bibasilar air space disease, as before.  Original Report Authenticated By: Reyes Ivan, M.D.   Dg Chest Port 1 View  09/10/2011  *RADIOLOGY REPORT*  Clinical Data: Follow up pneumothorax, evaluate chest.  PORTABLE CHEST - 1 VIEW  Comparison: Portable chest x-ray of 09/08/2011  Findings: The left chest tube remains extending to the left lung apex with the tip medially positioned.  No definite pneumothorax is seen.  Basilar opacities have improved slightly consistent with residual atelectasis left greater than right.  Mild cardiomegaly is stable.  Left chest wall subcutaneous air again is noted.  IMPRESSION: No change in medially positioned left apical chest tube with no definite pneumothorax.  Slightly better aeration.  Original Report Authenticated By: Juline Patch, M.D.   Dg Chest Portable 1 View  09/09/2011  *RADIOLOGY REPORT*  Clinical Data: Status post left-sided chest tube placement for pneumothorax; respiratory distress.  PORTABLE CHEST - 1 VIEW  Comparison: Chest radiograph performed earlier today at 11:25 p.m.  Findings: The lungs are significantly hypoexpanded.  There has been interval placement of a left apical chest tube, with apparent re- expansion of the left lung.  No definite pneumothorax is now seen. However, the lungs are not well assessed due to the degree of hypoexpansion.  Bilateral airspace opacities could reflect atelectasis or pneumonia, on comparison with prior CT from 08/14/2011.  The unusual pattern of air overlying the mediastinum reflects underlying blebs.  The mediastinum is not well assessed due to adjacent consolidation. No acute osseous  abnormalities are identified.  IMPRESSION:  1.  No definite evidence of pneumothorax, status post placement of left apical chest tube. 2.  The lungs are markedly hypoexpanded, with dense bilateral airspace opacities, more prominent on the left.  This could reflect atelectasis or pneumonia, on correlation with prior CT; the unusual pattern of air overlying the mediastinum reflects underlying blebs.  Original Report Authenticated By: Tonia Ghent, M.D.   Dg Chest Portable 1 View  09/08/2011  *RADIOLOGY REPORT*  Clinical Data: Decompression of left pneumothorax.  PORTABLE CHEST - 1 VIEW  Comparison: Earlier the same day  Findings: 2325 hours.  Marked thoracolumbar scoliosis distorts the thoracic and cardiomediastinal anatomy.  There is an apparent angiocath or small sheath no superimposed on the upper left chest wall.  The volume of gas within the left pleural space has decreased in the interval and there is some re-expansion of the left lung.  The left pneumothorax remains large, but there does appear to be less rightward shift of cardiomediastinal structures compared to the previous film.  There is some persistent to probable atelectasis at the right base. Cardiopericardial silhouette may be enlarged, but the distortion by scoliosis makes assessment difficult. Telemetry leads overlie the chest.  IMPRESSION: Interval evacuation of some of the left pleural air with slightly less rightward shift of cardiomediastinal structures although the marked thoracolumbar scoliosis hinders this assessment.  Right base atelectasis.  Original Report Authenticated By: ERIC A. MANSELL, M.D.   Dg Chest Portable 1 View  09/08/2011  *RADIOLOGY REPORT*  Clinical Data: Respiratory distress.  Shortness of breath.  PORTABLE CHEST - 1 VIEW  Comparison: Chest radiograph performed 08/22/2011  Findings: There is complete collapse of the left lung, with rightward shift of the mediastinum, compatible with a tension pneumothorax.  Right basilar  airspace opacification likely reflects residual atelectasis.  No definite pleural effusion  is identified.  The cardiomediastinal silhouette is difficult to fully assess due to overlying pneumothorax lucency.  No acute osseous abnormalities are identified.  IMPRESSION:  1.  Complete collapse of the left lung, with rightward shift of the mediastinum, compatible with a tension pneumothorax. 2.  Right basilar airspace opacity likely reflects residual atelectasis.  Critical Value/emergent results were called by telephone at the time of interpretation on 09/08/2011  at 10:58 p.m.  to  Dr. Marliss Czar- Ned Card, who verbally acknowledged these results.  Original Report Authenticated By: Tonia Ghent, M.D.   Dg Chest Port 1 View  08/22/2011  *RADIOLOGY REPORT*  Clinical Data: 46 year old female status post right thoracentesis. Pneumothorax.  PORTABLE CHEST - 1 VIEW  Comparison: 08/20/2011 and earlier.  Findings: Portable upright AP view 0730 hours.  Severe scoliosis. Stable lung volumes.  Mildly improved ventilation at the right lung base with residual confluent probable atelectasis.  No pneumothorax or edema.  Small right pleural effusion.  Stable cardiac size and mediastinal contours.  Visualized tracheal air column is within normal limits.  IMPRESSION: Mildly improved right lung base ventilation.  Residual atelectasis and small effusion.  Original Report Authenticated By: Ulla Potash III, M.D.    Scheduled Meds:   . acetaminophen  1,000 mg Intravenous Q6H  . albuterol  2.5 mg Nebulization TID  . bisacodyl  10 mg Oral Daily  . cefUROXime (ZINACEF)  IV  1.5 g Intravenous Q12H  . fentaNYL  100 mcg Transdermal Q72H  . FLUoxetine  60 mg Oral Daily  . levETIRAcetam  250 mg Oral Daily  . morphine  30 mg Oral Q12H  . mulitivitamin with minerals  1 tablet Oral Daily  . oxybutynin  10 mg Oral Daily  . pantoprazole  40 mg Oral Q1200  . simethicone  80 mg Oral QID  . sodium chloride      . DISCONTD: albuterol  2.5 mg  Nebulization Q4H WA  . DISCONTD: fentaNYL  100 mcg Transdermal Q72H  . DISCONTD: fentaNYL  75 mcg Transdermal Q72H   Continuous Infusions:   . dextrose 5 % and 0.45 % NaCl with KCl 20 mEq/L 20 mL/hr at 09/18/11 0500

## 2011-09-18 NOTE — Progress Notes (Signed)
Pt in and out self for 200cc on concentrated amber urine. Pt use her own home supply of 49fr catheter.  Rhonda Ramos

## 2011-09-18 NOTE — Progress Notes (Signed)
Utilization review completed.  

## 2011-09-19 ENCOUNTER — Inpatient Hospital Stay (HOSPITAL_COMMUNITY): Payer: Medicare Other

## 2011-09-19 DIAGNOSIS — F329 Major depressive disorder, single episode, unspecified: Secondary | ICD-10-CM

## 2011-09-19 DIAGNOSIS — J159 Unspecified bacterial pneumonia: Secondary | ICD-10-CM

## 2011-09-19 DIAGNOSIS — F3289 Other specified depressive episodes: Secondary | ICD-10-CM

## 2011-09-19 DIAGNOSIS — J930 Spontaneous tension pneumothorax: Secondary | ICD-10-CM

## 2011-09-19 DIAGNOSIS — G8921 Chronic pain due to trauma: Secondary | ICD-10-CM

## 2011-09-19 MED ORDER — MORPHINE SULFATE ER 15 MG PO TBCR
60.0000 mg | EXTENDED_RELEASE_TABLET | Freq: Two times a day (BID) | ORAL | Status: DC
  Administered 2011-09-19 – 2011-09-20 (×3): 60 mg via ORAL
  Filled 2011-09-19 (×3): qty 4

## 2011-09-19 MED ORDER — OXYCODONE HCL 5 MG PO TABS
20.0000 mg | ORAL_TABLET | ORAL | Status: DC | PRN
  Administered 2011-09-19 – 2011-09-21 (×14): 20 mg via ORAL
  Filled 2011-09-19 (×14): qty 4

## 2011-09-19 NOTE — Discharge Summary (Signed)
I agree with the above discharge summary and plan for follow-up.  Shmuel Girgis H  

## 2011-09-19 NOTE — Progress Notes (Addendum)
3 Days Post-Op Procedure(s) (LRB): VIDEO ASSISTED THORACOSCOPY (VATS) W/TALC PLEUADESIS (Left)  Subjective:  Ms. Nonaka complains of pain at chest tube site this morning.  However, she states that this has improved significantly since they adjusted her pain medication.   Objective: Vital signs in last 24 hours: Temp:  [98.2 F (36.8 C)-99.2 F (37.3 C)] 98.8 F (37.1 C) (05/17 0720) Pulse Rate:  [96-110] 96  (05/17 0343) Cardiac Rhythm:  [-] Sinus tachycardia (05/17 0343) Resp:  [15-28] 16  (05/16 2340) BP: (91-120)/(57-70) 120/70 mmHg (05/17 0343) SpO2:  [92 %-96 %] 94 % (05/17 0805)   Intake/Output from previous day: 05/16 0701 - 05/17 0700 In: 1200 [P.O.:720; I.V.:480] Out: 1275 [Urine:950; Chest Tube:325]   General appearance: alert, cooperative and no distress Heart: regular rate and rhythm Lungs: clear to auscultation bilaterally Abdomen: soft, non-tender; bowel sounds normal; no masses,  no organomegaly Wound: clean and dry  Lab Results:  Basename 09/18/11 0415 09/17/11 0500  WBC 10.8* 7.9  HGB 10.3* 10.8*  HCT 31.9* 32.6*  PLT 338 339   BMET:  Basename 09/18/11 0415 09/17/11 0500  NA 134* 137  K 4.2 3.9  CL 100 103  CO2 28 24  GLUCOSE 106* 130*  BUN 11 10  CREATININE 0.78 0.75  CALCIUM 10.0 9.7    PT/INR:  Basename 09/16/11 1124  LABPROT 12.9  INR 0.95   ABG    Component Value Date/Time   PHART 7.371 09/17/2011 0422   HCO3 26.4* 09/17/2011 0422   TCO2 28 09/17/2011 0422   ACIDBASEDEF 4.0* 09/09/2011 0022   O2SAT 94.0 09/17/2011 0422   CBG (last 3)  No results found for this basename: GLUCAP:3 in the last 72 hours  Assessment/Plan: S/P Procedure(s) (LRB): VIDEO ASSISTED THORACOSCOPY (VATS) W/TALC PLEUADESIS (Left)  1. Anterior chest tube removed yesterday, chest tube in place on water seal with no air leak.  No Pneumothorax appreciated on chest xray this morning.  2. Ambulation 3. Dispo- chest tube in place with no air leak or pneumothorax on  chest tube.  May be safe to d/c chest tube today or tomorrow   LOS: 11 days    BARRETT, ERIN 09/19/2011   I have seen and examined the patient and agree with the assessment and plan as outlined.  Will plan to d/c tube tomorrow if CXR stable, then possibly d/c to her parents' home Sunday.  Paizlie Klaus H 09/19/2011 5:12 PM

## 2011-09-19 NOTE — Progress Notes (Signed)
Report given to Gabe, RN 

## 2011-09-19 NOTE — Progress Notes (Signed)
[facility] PROGRESS NOTE  ZAEDA MCFERRAN MWU:[id number] DOB: 23-Jun-1965 DOA: 09/08/2011   Assessment/Plan: Patient Active Hospital Problem List: Spontaneous tension pneumothorax (09/17/2011) -5/6  chest tube. Posterior CTremoved on 09/18/2011 -cont CT per [facility],  -CXR: residual pneumothorax seen along the left heart border  -pain management.  -    DEPRESSION (11/13/2009) -continue home SSRI And clonazepam prn  Chronic pain syndrome (11/13/2009) -continued home fentanyl patch and increase MS contin and oxy d/c fentanyl injections.  Seizure (08/28/2010) -home keppra  Pneumonia (08/11/2011) -avelox 5/9-5/11 -aebrile.    Code Status: Full code  Family Communication: Mother [phone]  Disposition Plan: home  Lambert Keto, MD  [facility] Pager [phone]  If 7PM-7AM, please contact night-coverage [email or web address] Password [email or web address] 09/19/2011, 7:30 AM   LOS: 11 days   Procedures: -5/6 L chest tube (perCVTS) >> posterior 09/18/2011. -VATS 09/16/11   Antibiotics: 5/7 vanc (HCAP) >> 5/9  5/7 zosyn (HCAP) >> 5/9  Avelox 5/9 >> 5/11  Subjective: Pain controlled, She has a high tolerance for narcotics.  Objective: Filed Vitals:   09/18/11 2340 09/19/11 0300 09/19/11 0343 09/19/11 0720  BP: 104/61  120/70   Pulse: 100  96   Temp: 98.7 F (37.1 C)  98.6 F (37 C) 98.8 F (37.1 C)  TempSrc: Oral Oral Oral   Resp: 16     Height:      Weight:      SpO2: 95%       Intake/Output Summary (Last 24 hours) at 09/19/11 0730 Last data filed at 09/19/11 0720  Gross per 24 hour  Intake   1200 ml  Output   1275 ml  Net    -75 ml   Weight change:   Exam:  General: Alert, awake, oriented x3, in no acute distress.  HEENT: No bruits, no goiter.  Heart: Regular rate and tachycardic, without murmurs, rubs, gallops.  Lungs: Good air movement, bilateral air movement. Crackles on the right and left Chest tube on the left side. Abdomen: Soft, nontender,  nondistended, positive bowel sounds.  Neuro: Grossly intact, nonfocal.   Data Reviewed: Basic Metabolic Panel:  Lab 09/18/11 6387 09/17/11 0500  NA 134* 137  K 4.2 3.9  CL 100 103  CO2 28 24  GLUCOSE 106* 130*  BUN 11 10  CREATININE 0.78 0.75  CALCIUM 10.0 9.7  MG -- --  PHOS -- --   Liver Function Tests:  Lab 09/18/11 0415  AST 17  ALT 23  ALKPHOS 62  BILITOT 0.3  PROT 5.9*  ALBUMIN 2.5*   No results found for this basename: LIPASE:5,AMYLASE:5 in the last 168 hours No results found for this basename: AMMONIA:5 in the last 168 hours CBC:  Lab 09/18/11 0415 09/17/11 0500  WBC 10.8* 7.9  NEUTROABS -- --  HGB 10.3* 10.8*  HCT 31.9* 32.6*  MCV 91.4 89.6  PLT 338 339   Cardiac Enzymes: No results found for this basename: CKTOTAL:5,CKMB:5,CKMBINDEX:5,TROPONINI:5 in the last 168 hours BNP: No components found with this basename: POCBNP:5 CBG: No results found for this basename: GLUCAP:5 in the last 168 hours  Recent Results (from the past 240 hour(s))  SURGICAL PCR SCREEN     Status: Normal   Collection Time   09/16/11  2:44 AM      Component Value Range Status Comment   MRSA, PCR NEGATIVE  NEGATIVE  Final    Staphylococcus aureus NEGATIVE  NEGATIVE  Final      Studies: Dg Chest 2 View  08/20/2011  *  RADIOLOGY REPORT*  Clinical Data: Dyspnea  CHEST - 2 VIEW  Comparison: 08/17/2011  Findings: Removal of the right IJ central line.  Some interval improvement in bibasilar airspace disease/atelectasis.  Small right effusion remains.  No pneumothorax evident. Marked scoliosis of the spine.  IMPRESSION: Improving bibasilar airspace disease/atelectasis.  Persistent small right effusion.  Original Report Authenticated By: Judie Petit. Ruel Favors, M.D.   Dg Chest Port 1 View  09/18/2011  *RADIOLOGY REPORT*  Clinical Data: Shortness of breath, chest tubes  PORTABLE CHEST - 1 VIEW  Comparison: Portable chest x-ray of 09/17/2011  Findings: Two left chest tubes remain and there is  little change in aeration with basilar atelectasis bilaterally.  No definite pneumothorax is seen.  There is still a small amount of left chest wall subcutaneous emphysema present.  Right central venous line is unchanged in position.  Heart size is stable.  IMPRESSION: No definite pneumothorax with two left chest tubes remaining. Little change in bibasilar atelectasis.  Original Report Authenticated By: Juline Patch, M.D.   Dg Chest Port 1 View  09/17/2011  *RADIOLOGY REPORT*  Clinical Data: Chest tubes.  PORTABLE CHEST - 1 VIEW  Comparison: Chest 09/16/2011 and 09/15/2011.  Findings: Two left chest tubes remain in place.  There may be a tiny left apical pneumothorax.  Subcutaneous air along the left chest wall is noted.  Left basilar atelectasis has improved.  Right basilar atelectasis is increased.  Heart size normal.  IMPRESSION:  1.  Two left chest tubes in place.  There may be a tiny left apical pneumothorax. 2.  Improved left basilar airspace disease. 3.  Increased right basilar atelectasis.  Original Report Authenticated By: Bernadene Bell. D'ALESSIO, M.D.   Dg Chest Portable 1 View  09/16/2011  *RADIOLOGY REPORT*  Clinical Data: Postop left VATS.  Left chest tubes in place.  PORTABLE CHEST - 1 VIEW hours:  Comparison: Portable chest x-ray yesterday and dating back to 09/10/2011.  Findings: 2 left chest tubes in place with small (5% or so) left apical pneumothorax, post VATS presumably for bullectomy, as the large bleb identified on the prior examinations is no longer seen. Markedly suboptimal inspiration with worsening atelectasis in the lung bases.  Right lung otherwise clear.  Right jugular central venous catheter tip in the SVC.  Subcutaneous emphysema in the left chest wall and neck, unchanged.  IMPRESSION:  1.  Small (5% or so) left apical pneumothorax with two chest tubes in place. 2.  Markedly suboptimal inspiration accounts for worsening bibasilar atelectasis, right greater than left. 3.  Stable  small amount of subcutaneous emphysema in the left chest wall and neck.  Original Report Authenticated By: Arnell Sieving, M.D.   Dg Chest Port 1 View  09/15/2011  *RADIOLOGY REPORT*  Clinical Data: Left chest tube.  PORTABLE CHEST - 1 VIEW  Comparison: 09/14/2011  Findings: Left chest tube remains in place, unchanged. Subcutaneous air is stable.  No visible pneumothorax.  Bibasilar atelectasis.  Heart is normal size.  Severe thoracolumbar scoliosis.  IMPRESSION: Left chest tube remains in stable position without visible pneumothorax.  Bibasilar atelectasis.  Original Report Authenticated By: Cyndie Chime, M.D.   Dg Chest Port 1 View  09/14/2011  *RADIOLOGY REPORT*  Clinical Data: Left-sided pneumothorax.  Chest tube.  PORTABLE CHEST - 1 VIEW  Comparison: Portable chest 09/13/2011.  Findings: A left-sided chest tube remains.  There is no definite pneumothorax.  Subcutaneous air remains.  Bibasilar airspace disease is not significantly changed.  The lung volumes  are low.  IMPRESSION:  1.  Stable left-sided chest tube without evidence for residual pneumothorax. 2.  Persistent bibasilar airspace disease, likely reflecting atelectasis. 3.  Low lung volumes.  Original Report Authenticated By: Jamesetta Orleans. MATTERN, M.D.   Dg Chest Port 1 View  09/13/2011  *RADIOLOGY REPORT*  Clinical Data: Evaluate left-sided pneumothorax  PORTABLE CHEST - 1 VIEW  Comparison: 09/12/2011  Findings: The heart size appears normal.  No pleural effusion or edema.  There is a left-sided chest tube in place.  Tiny left pneumothorax along the left heart border is again noted and appears improved from previous exam.  There is extensive left chest wall emphysema.  Lung volumes are low.  There is extensive atelectasis and decreased aeration to both lung bases.  IMPRESSION:  1.  Low lung volumes with bibasilar atelectasis. 2.  Tiny residual pneumothorax is seen along the left heart border which appears less conspicuous from previous  exam.  Original Report Authenticated By: Rosealee Albee, M.D.   Dg Chest Portable 1 View  09/12/2011  *RADIOLOGY REPORT*  Clinical Data: Follow-up left pneumothorax.  PORTABLE CHEST - 1 VIEW  Comparison: 09/11/2011  Findings: Low lung volumes are again demonstrated with persistent bibasilar subsegmental atelectasis.  Left chest tube remains in place, and a tiny residual pneumothorax is seen along the left heart border.  Subcutaneous emphysema again seen in the left chest wall soft tissues.  Heart size remains normal.  IMPRESSION:  1.  Tiny residual pneumothorax seen along the left heart border. 2.  No significant change in bilateral subsegmental atelectasis.  Original Report Authenticated By: Danae Orleans, M.D.   Dg Chest Port 1 View  09/11/2011  *RADIOLOGY REPORT*  Clinical Data: Left chest tube, shortness of breath.  PORTABLE CHEST - 1 VIEW  Comparison: 09/10/2011.  Findings: Trachea is midline.  Heart size is grossly stable.  Left chest tube is in place.  No definite pneumothorax.  Subcutaneous emphysema is seen along the left neck and left chest wall, but has improved slightly in the interval.  Lungs are very low in volume with bibasilar air space disease, as before.  IMPRESSION:  1.  No definite pneumothorax with left chest tube in place. Resolving subcutaneous emphysema. 2.  Low lung volumes with bibasilar air space disease, as before.  Original Report Authenticated By: Reyes Ivan, M.D.   Dg Chest Port 1 View  09/10/2011  *RADIOLOGY REPORT*  Clinical Data: Follow up pneumothorax, evaluate chest.  PORTABLE CHEST - 1 VIEW  Comparison: Portable chest x-ray of 09/08/2011  Findings: The left chest tube remains extending to the left lung apex with the tip medially positioned.  No definite pneumothorax is seen.  Basilar opacities have improved slightly consistent with residual atelectasis left greater than right.  Mild cardiomegaly is stable.  Left chest wall subcutaneous air again is noted.   IMPRESSION: No change in medially positioned left apical chest tube with no definite pneumothorax.  Slightly better aeration.  Original Report Authenticated By: Juline Patch, M.D.   Dg Chest Portable 1 View  09/09/2011  *RADIOLOGY REPORT*  Clinical Data: Status post left-sided chest tube placement for pneumothorax; respiratory distress.  PORTABLE CHEST - 1 VIEW  Comparison: Chest radiograph performed earlier today at 11:25 p.m.  Findings: The lungs are significantly hypoexpanded.  There has been interval placement of a left apical chest tube, with apparent re- expansion of the left lung.  No definite pneumothorax is now seen. However, the lungs are not well assessed due  to the degree of hypoexpansion.  Bilateral airspace opacities could reflect atelectasis or pneumonia, on comparison with prior CT from 08/14/2011.  The unusual pattern of air overlying the mediastinum reflects underlying blebs.  The mediastinum is not well assessed due to adjacent consolidation. No acute osseous abnormalities are identified.  IMPRESSION:  1.  No definite evidence of pneumothorax, status post placement of left apical chest tube. 2.  The lungs are markedly hypoexpanded, with dense bilateral airspace opacities, more prominent on the left.  This could reflect atelectasis or pneumonia, on correlation with prior CT; the unusual pattern of air overlying the mediastinum reflects underlying blebs.  Original Report Authenticated By: Tonia Ghent, M.D.   Dg Chest Portable 1 View  09/08/2011  *RADIOLOGY REPORT*  Clinical Data: Decompression of left pneumothorax.  PORTABLE CHEST - 1 VIEW  Comparison: Earlier the same day  Findings: 2325 hours.  Marked thoracolumbar scoliosis distorts the thoracic and cardiomediastinal anatomy.  There is an apparent angiocath or small sheath no superimposed on the upper left chest wall.  The volume of gas within the left pleural space has decreased in the interval and there is some re-expansion of the left  lung.  The left pneumothorax remains large, but there does appear to be less rightward shift of cardiomediastinal structures compared to the previous film.  There is some persistent to probable atelectasis at the right base. Cardiopericardial silhouette may be enlarged, but the distortion by scoliosis makes assessment difficult. Telemetry leads overlie the chest.  IMPRESSION: Interval evacuation of some of the left pleural air with slightly less rightward shift of cardiomediastinal structures although the marked thoracolumbar scoliosis hinders this assessment.  Right base atelectasis.  Original Report Authenticated By: ERIC A. MANSELL, M.D.   Dg Chest Portable 1 View  09/08/2011  *RADIOLOGY REPORT*  Clinical Data: Respiratory distress.  Shortness of breath.  PORTABLE CHEST - 1 VIEW  Comparison: Chest radiograph performed 08/22/2011  Findings: There is complete collapse of the left lung, with rightward shift of the mediastinum, compatible with a tension pneumothorax.  Right basilar airspace opacification likely reflects residual atelectasis.  No definite pleural effusion is identified.  The cardiomediastinal silhouette is difficult to fully assess due to overlying pneumothorax lucency.  No acute osseous abnormalities are identified.  IMPRESSION:  1.  Complete collapse of the left lung, with rightward shift of the mediastinum, compatible with a tension pneumothorax. 2.  Right basilar airspace opacity likely reflects residual atelectasis.  Critical Value/emergent results were called by telephone at the time of interpretation on 09/08/2011  at 10:58 p.m.  to  Dr. Marliss Czar- Ned Card, who verbally acknowledged these results.  Original Report Authenticated By: Tonia Ghent, M.D.   Dg Chest Port 1 View  08/22/2011  *RADIOLOGY REPORT*  Clinical Data: 46 year old female status post right thoracentesis. Pneumothorax.  PORTABLE CHEST - 1 VIEW  Comparison: 08/20/2011 and earlier.  Findings: Portable upright AP view 0730  hours.  Severe scoliosis. Stable lung volumes.  Mildly improved ventilation at the right lung base with residual confluent probable atelectasis.  No pneumothorax or edema.  Small right pleural effusion.  Stable cardiac size and mediastinal contours.  Visualized tracheal air column is within normal limits.  IMPRESSION: Mildly improved right lung base ventilation.  Residual atelectasis and small effusion.  Original Report Authenticated By: Ulla Potash III, M.D.    Scheduled Meds:    . albuterol  2.5 mg Nebulization TID  . bisacodyl  10 mg Oral Daily  . fentaNYL  100 mcg Transdermal Q72H  .  FLUoxetine  60 mg Oral Daily  . levETIRAcetam  250 mg Oral Daily  . morphine  30 mg Oral Q12H  . mulitivitamin with minerals  1 tablet Oral Daily  . oxybutynin  10 mg Oral Daily  . pantoprazole  40 mg Oral Q1200  . simethicone  80 mg Oral QID   Continuous Infusions:    . dextrose 5 % and 0.45 % NaCl with KCl 20 mEq/L 20 mL/hr at 09/18/11 0500

## 2011-09-19 NOTE — Progress Notes (Signed)
Assumed care of pt. No changes from my am assessment noted.

## 2011-09-19 NOTE — Discharge Summary (Signed)
Physician Discharge Summary  Patient ID: [name] MRN: [id number] DOB/AGE: 1965-11-05 46 y.o.  Admit date: 09/08/2011 Discharge date: 09/19/2011  Admission Diagnoses:  Patient Active Problem List  Diagnoses  . DEPRESSION  . Chronic pain syndrome  . HYPERTENSION  . GERD  . SPINA BIFIDA  . URINARY INCONTINENCE  . Seizure  . Abnormal Pap smear of cervix  . Pneumothorax, right  . Recurrent spontaneous pneumothorax  . Urinary tract infection  . Acute respiratory failure with hypoxia  . Pneumonia  . Severe sepsis  . Diarrhea  . Tension pneumothorax  . Spontaneous tension pneumothorax   Discharge Diagnoses:  Patient Active Problem List  Diagnoses  . DEPRESSION  . Chronic pain syndrome  . HYPERTENSION  . GERD  . SPINA BIFIDA  . URINARY INCONTINENCE  . Seizure  . Abnormal Pap smear of cervix  . Pneumothorax, right  . Recurrent spontaneous pneumothorax  . Urinary tract infection  . Acute respiratory failure with hypoxia  . Pneumonia  . Severe sepsis  . Diarrhea  . Tension pneumothorax   S/P L Thoracotomy  . Spontaneous tension pneumothorax    Discharged Condition: good  Hospital Course:   Rhonda Ramos is a 46 yo female with multiple medical problems who is well known to [facility].  She presented to the ED on 09/08/2011 with an acute onset of shortness of breath.  CXR revealed a large left sided tension pneumothorax for which emergent needle decompression was performed.  [facility] was consulted and placed a left sided chest tube with resolution of pneumothorax.  She was subsequently admitted by the medicine service due to her underlying medical problems and new onset left sided pneumonia.  HD #1 patients chest xray revealed no evidence of pneumothorax, chest tube remained on suction.  HD #2 chest tube remained on suction.  HD #3 the patients chest tube was placed on water seal.  HD #4 no evidence of pneumothorax.  Chest tube remained on water seal.  HD #5  The patient developed a large  air leak, chest tube left on water seal.  CT chest last month showed large bleb on left.  HD #6 the patients chest tube remained on water seal with air leak present.  HD #7 the patient continued to have a large air leak.  It was felt that she would require a VATS with stapling of apical blebs.  The risks and benefits of the procedure were explained to the patient and she was willing to proceed with surgery.  HD #8 the patient was taken to the OR and underwent Left sided VATS with stapling of blebs, apical pleurectomy and mechanical pleurodesis.  The patient tolerated the procedure well.  She was extubated and taken to the ICU in stable condition.  POD #1 the patient's arterial line was removed.  She was medically stable and transferred to the step down unit.  POD #2 the patients anterior chest tube was removed.  Her posterior tube remained in place and was put on water seal.  POD #3 patients chest xray does not reveal any evidence of pneumothorax.  There was also no air leak appreciated.  Her posterior tube will remain in place today.  The patient is doing well.  Her pain is currently controlled.  She she continue to progress and no air leak develops overnight, it is possible to d/c her final chest tube tomorrow.  If no acute pneumothorax develops post chest tube removal she would be stable for d/c home in the next  48 hours  Disposition: 01-Home or Self Care   Medication List  As of 09/19/2011 10:06 AM   TAKE these medications         amLODipine 5 MG tablet   Commonly known as: NORVASC   Take 5 mg by mouth daily.      clonazePAM 0.5 MG tablet   Commonly known as: KLONOPIN   Take 0.5 mg by mouth 2 (two) times daily as needed. For anxiety.      fentaNYL 50 MCG/HR   Commonly known as: DURAGESIC - dosed mcg/hr   Place 1 patch onto the skin every 3 (three) days.      FLUoxetine 20 MG tablet   Commonly known as: PROZAC   Take 60 mg by mouth daily.      levETIRAcetam 250 MG tablet   Commonly known  as: KEPPRA   Take 250 mg by mouth daily.      mulitivitamin with minerals Tabs   Take 1 tablet by mouth daily.      norethindrone 0.35 MG tablet   Commonly known as: MICRONOR,CAMILA,ERRIN   Take 1 tablet by mouth daily.      omeprazole 20 MG capsule   Commonly known as: PRILOSEC   Take 20 mg by mouth daily.      oxybutynin 10 MG 24 hr tablet   Commonly known as: DITROPAN-XL   Take 10 mg by mouth daily.      sodium chloride 0.65 % nasal spray   Commonly known as: OCEAN   Place 1 spray into the nose at bedtime as needed. For dry nose      tetrahydrozoline 0.05 % ophthalmic solution   Place 1 drop into both eyes 2 (two) times daily as needed. For dry eyes             Signed: Lowella Dandy 09/19/2011, 10:06 AM

## 2011-09-20 ENCOUNTER — Inpatient Hospital Stay (HOSPITAL_COMMUNITY): Payer: Medicare Other

## 2011-09-20 MED ORDER — MORPHINE SULFATE ER 100 MG PO TBCR
100.0000 mg | EXTENDED_RELEASE_TABLET | Freq: Three times a day (TID) | ORAL | Status: DC
  Administered 2011-09-20 – 2011-09-21 (×3): 100 mg via ORAL
  Filled 2011-09-20 (×3): qty 1

## 2011-09-20 MED ORDER — MORPHINE SULFATE ER 100 MG PO TBCR: 100.0000 mg | EXTENDED_RELEASE_TABLET | Freq: Three times a day (TID) | ORAL | Status: DC

## 2011-09-20 MED ORDER — OXYCODONE HCL 20 MG PO TABS: 20.0000 mg | ORAL_TABLET | ORAL | Status: AC | PRN

## 2011-09-20 NOTE — Progress Notes (Signed)
Patient is frequently removing O2. States, "I'm testing myself to see if I can do without this." Advised that O2 sats are dropping and to replace nasal cannula. Pt noncompliant. Lurline Idol [facility]

## 2011-09-20 NOTE — Progress Notes (Signed)
Pt CT d/c per MD order, pt tol well, new order for c-xray placed for CT reomoval, pt educated to call RN for SOB, or any changes from current baseline, RN /N taking care of pt of CT removal

## 2011-09-20 NOTE — Progress Notes (Addendum)
4 Days Post-Op Procedure(s) (LRB): VIDEO ASSISTED THORACOSCOPY (VATS) W/TALC PLEUADESIS (Left)  Subjective: Patient with complaints of "rib pain" after chest tube removed.  Objective: Vital signs in last 24 hours: Patient Vitals for the past 24 hrs:  BP Temp Temp src Pulse Resp SpO2  09/20/11 1159 - 99.4 F (37.4 C) - - - -  09/20/11 0849 - - - - - 93 %  09/20/11 0721 110/64 mmHg - - 99  19  96 %  09/20/11 0346 116/62 mmHg 98.8 F (37.1 C) Oral - - 92 %  09/20/11 0047 - - - 105  29  96 %  09/20/11 0016 105/64 mmHg 99.2 F (37.3 C) Oral 105  26  98 %  09/19/11 2135 - - - 103  13  92 %  09/19/11 2035 - - - - - 97 %  09/19/11 2000 108/68 mmHg 98 F (36.7 C) Oral - - 93 %  09/19/11 1553 106/57 mmHg 98.5 F (36.9 C) Oral 99  17  97 %  09/19/11 1439 - - - - - 93 %  09/19/11 1328 - 98.4 F (36.9 C) - - - -    Current Weight  09/17/11 100 lb 15.5 oz (45.8 kg)      Intake/Output from previous day: 05/17 0701 - 05/18 0700 In: 1360 [P.O.:960; I.V.:400] Out: 1100 [Urine:1025; Chest Tube:75]   Physical Exam:  Cardiovascular: RRR. Pulmonary: Diminished at left base; no rales, wheezes, or rhonchi. Abdomen: Soft, non tender, bowel sounds present. Wounds: Dressings clean and dry.    Lab Results: CBC: Basename 09/18/11 0415  WBC 10.8*  HGB 10.3*  HCT 31.9*  PLT 338   BMET:  Basename 09/18/11 0415  NA 134*  K 4.2  CL 100  CO2 28  GLUCOSE 106*  BUN 11  CREATININE 0.78  CALCIUM 10.0    PT/INR: No results found for this basename: LABPROT,INR in the last 72 hours ABG:  INR: Will add last result for INR, ABG once components are confirmed Will add last 4 CBG results once components are confirmed  Assessment/Plan:  1.Pulmonary-Chest tube just removed.Portable CXR being taken now.Check CXR in am as well. Possibly discharge 1-2 days.  ZIMMERMAN,[name] MPA-C 09/20/2011   I have seen and examined the patient and agree with the assessment and plan as outlined.   Possibly ready for d/c home tomorrow if stable off oxygen.  Laportia Carley H 09/20/2011 4:46 PM

## 2011-09-20 NOTE — Progress Notes (Signed)
[facility] PROGRESS NOTE  Rhonda Ramos RUE:[id number] DOB: 1965/09/13 DOA: 09/08/2011   Assessment/Plan: Patient Active Hospital Problem List: Spontaneous tension pneumothorax (09/17/2011) -5/6  chest tube. Posterior CTremoved on 09/18/2011, left side chest tube to removed today. -cont CT per CVTS,  -CXR: residual pneumothorax seen along the left heart border  -pain management. I have simplify her pain regimen. More cost effective. - ? Home. D/c summary already done, by surgery ok to d/c from medicine standpoint.  DEPRESSION (11/13/2009) -continue home SSRI And clonazepam prn  Chronic pain syndrome (11/13/2009) -continued home fentanyl patch and increase MS contin and oxy d/c fentanyl injections.  Seizure (08/28/2010) -home keppra  Pneumonia (08/11/2011) -avelox 5/9-5/11 -aebrile.    Code Status: Full code  Family Communication: Mother [phone]  Disposition Plan: home  Lambert Keto, MD  [facility] Pager [phone]  If 7PM-7AM, please contact night-coverage [email or web address] Password [facility] 09/20/2011, 10:44 AM   LOS: 12 days   Procedures: -5/6 L chest tube (perCVTS) >> posterior 09/18/2011. -VATS 09/16/11   Antibiotics: 5/7 vanc (HCAP) >> 5/9  5/7 zosyn (HCAP) >> 5/9  Avelox 5/9 >> 5/11  Subjective: Pain controlled, She has a high tolerance for narcotics.  Objective: Filed Vitals:   09/20/11 0016 09/20/11 0047 09/20/11 0346 09/20/11 0849  BP: 105/64  116/62   Pulse: 105 105    Temp: 99.2 F (37.3 C)  98.8 F (37.1 C)   TempSrc: Oral  Oral   Resp: 26 29    Height:      Weight:      SpO2: 98% 96% 92% 93%    Intake/Output Summary (Last 24 hours) at 09/20/11 1044 Last data filed at 09/20/11 0600  Gross per 24 hour  Intake    800 ml  Output   1100 ml  Net   -300 ml   Weight change:   Exam:  General: Alert, awake, oriented x3, in no acute distress.  HEENT: No bruits, no goiter.  Heart: Regular rate and tachycardic, without  murmurs, rubs, gallops.  Lungs: Good air movement, bilateral air movement. Crackles on the left, Chest tube on the left side. Abdomen: Soft, nontender, nondistended, positive bowel sounds.  Neuro: Grossly intact, nonfocal.   Data Reviewed: Basic Metabolic Panel:  Lab 09/18/11 4696 09/17/11 0500  NA 134* 137  K 4.2 3.9  CL 100 103  CO2 28 24  GLUCOSE 106* 130*  BUN 11 10  CREATININE 0.78 0.75  CALCIUM 10.0 9.7  MG -- --  PHOS -- --   Liver Function Tests:  Lab 09/18/11 0415  AST 17  ALT 23  ALKPHOS 62  BILITOT 0.3  PROT 5.9*  ALBUMIN 2.5*   No results found for this basename: LIPASE:5,AMYLASE:5 in the last 168 hours No results found for this basename: AMMONIA:5 in the last 168 hours CBC:  Lab 09/18/11 0415 09/17/11 0500  WBC 10.8* 7.9  NEUTROABS -- --  HGB 10.3* 10.8*  HCT 31.9* 32.6*  MCV 91.4 89.6  PLT 338 339   Cardiac Enzymes: No results found for this basename: CKTOTAL:5,CKMB:5,CKMBINDEX:5,TROPONINI:5 in the last 168 hours BNP: No components found with this basename: POCBNP:5 CBG: No results found for this basename: GLUCAP:5 in the last 168 hours  Recent Results (from the past 240 hour(s))  SURGICAL PCR SCREEN     Status: Normal   Collection Time   09/16/11  2:44 AM      Component Value Range Status Comment   MRSA, PCR NEGATIVE  NEGATIVE  Final  Staphylococcus aureus NEGATIVE  NEGATIVE  Final      Studies: Dg Chest 2 View  08/20/2011  *RADIOLOGY REPORT*  Clinical Data: Dyspnea  CHEST - 2 VIEW  Comparison: 08/17/2011  Findings: Removal of the right IJ central line.  Some interval improvement in bibasilar airspace disease/atelectasis.  Small right effusion remains.  No pneumothorax evident. Marked scoliosis of the spine.  IMPRESSION: Improving bibasilar airspace disease/atelectasis.  Persistent small right effusion.  Original Report Authenticated By: Judie Petit. Ruel Favors, M.D.   Dg Chest Port 1 View  09/18/2011  *RADIOLOGY REPORT*  Clinical Data:  Shortness of breath, chest tubes  PORTABLE CHEST - 1 VIEW  Comparison: Portable chest x-ray of 09/17/2011  Findings: Two left chest tubes remain and there is little change in aeration with basilar atelectasis bilaterally.  No definite pneumothorax is seen.  There is still a small amount of left chest wall subcutaneous emphysema present.  Right central venous line is unchanged in position.  Heart size is stable.  IMPRESSION: No definite pneumothorax with two left chest tubes remaining. Little change in bibasilar atelectasis.  Original Report Authenticated By: Juline Patch, M.D.   Dg Chest Port 1 View  09/17/2011  *RADIOLOGY REPORT*  Clinical Data: Chest tubes.  PORTABLE CHEST - 1 VIEW  Comparison: Chest 09/16/2011 and 09/15/2011.  Findings: Two left chest tubes remain in place.  There may be a tiny left apical pneumothorax.  Subcutaneous air along the left chest wall is noted.  Left basilar atelectasis has improved.  Right basilar atelectasis is increased.  Heart size normal.  IMPRESSION:  1.  Two left chest tubes in place.  There may be a tiny left apical pneumothorax. 2.  Improved left basilar airspace disease. 3.  Increased right basilar atelectasis.  Original Report Authenticated By: Bernadene Bell. D'ALESSIO, M.D.   Dg Chest Portable 1 View  09/16/2011  *RADIOLOGY REPORT*  Clinical Data: Postop left VATS.  Left chest tubes in place.  PORTABLE CHEST - 1 VIEW hours:  Comparison: Portable chest x-ray yesterday and dating back to 09/10/2011.  Findings: 2 left chest tubes in place with small (5% or so) left apical pneumothorax, post VATS presumably for bullectomy, as the large bleb identified on the prior examinations is no longer seen. Markedly suboptimal inspiration with worsening atelectasis in the lung bases.  Right lung otherwise clear.  Right jugular central venous catheter tip in the SVC.  Subcutaneous emphysema in the left chest wall and neck, unchanged.  IMPRESSION:  1.  Small (5% or so) left apical  pneumothorax with two chest tubes in place. 2.  Markedly suboptimal inspiration accounts for worsening bibasilar atelectasis, right greater than left. 3.  Stable small amount of subcutaneous emphysema in the left chest wall and neck.  Original Report Authenticated By: Arnell Sieving, M.D.   Dg Chest Port 1 View  09/15/2011  *RADIOLOGY REPORT*  Clinical Data: Left chest tube.  PORTABLE CHEST - 1 VIEW  Comparison: 09/14/2011  Findings: Left chest tube remains in place, unchanged. Subcutaneous air is stable.  No visible pneumothorax.  Bibasilar atelectasis.  Heart is normal size.  Severe thoracolumbar scoliosis.  IMPRESSION: Left chest tube remains in stable position without visible pneumothorax.  Bibasilar atelectasis.  Original Report Authenticated By: Cyndie Chime, M.D.   Dg Chest Port 1 View  09/14/2011  *RADIOLOGY REPORT*  Clinical Data: Left-sided pneumothorax.  Chest tube.  PORTABLE CHEST - 1 VIEW  Comparison: Portable chest 09/13/2011.  Findings: A left-sided chest tube remains.  There  is no definite pneumothorax.  Subcutaneous air remains.  Bibasilar airspace disease is not significantly changed.  The lung volumes are low.  IMPRESSION:  1.  Stable left-sided chest tube without evidence for residual pneumothorax. 2.  Persistent bibasilar airspace disease, likely reflecting atelectasis. 3.  Low lung volumes.  Original Report Authenticated By: Jamesetta Orleans. MATTERN, M.D.   Dg Chest Port 1 View  09/13/2011  *RADIOLOGY REPORT*  Clinical Data: Evaluate left-sided pneumothorax  PORTABLE CHEST - 1 VIEW  Comparison: 09/12/2011  Findings: The heart size appears normal.  No pleural effusion or edema.  There is a left-sided chest tube in place.  Tiny left pneumothorax along the left heart border is again noted and appears improved from previous exam.  There is extensive left chest wall emphysema.  Lung volumes are low.  There is extensive atelectasis and decreased aeration to both lung bases.  IMPRESSION:   1.  Low lung volumes with bibasilar atelectasis. 2.  Tiny residual pneumothorax is seen along the left heart border which appears less conspicuous from previous exam.  Original Report Authenticated By: Rosealee Albee, M.D.   Dg Chest Portable 1 View  09/12/2011  *RADIOLOGY REPORT*  Clinical Data: Follow-up left pneumothorax.  PORTABLE CHEST - 1 VIEW  Comparison: 09/11/2011  Findings: Low lung volumes are again demonstrated with persistent bibasilar subsegmental atelectasis.  Left chest tube remains in place, and a tiny residual pneumothorax is seen along the left heart border.  Subcutaneous emphysema again seen in the left chest wall soft tissues.  Heart size remains normal.  IMPRESSION:  1.  Tiny residual pneumothorax seen along the left heart border. 2.  No significant change in bilateral subsegmental atelectasis.  Original Report Authenticated By: Danae Orleans, M.D.   Dg Chest Port 1 View  09/11/2011  *RADIOLOGY REPORT*  Clinical Data: Left chest tube, shortness of breath.  PORTABLE CHEST - 1 VIEW  Comparison: 09/10/2011.  Findings: Trachea is midline.  Heart size is grossly stable.  Left chest tube is in place.  No definite pneumothorax.  Subcutaneous emphysema is seen along the left neck and left chest wall, but has improved slightly in the interval.  Lungs are very low in volume with bibasilar air space disease, as before.  IMPRESSION:  1.  No definite pneumothorax with left chest tube in place. Resolving subcutaneous emphysema. 2.  Low lung volumes with bibasilar air space disease, as before.  Original Report Authenticated By: Reyes Ivan, M.D.   Dg Chest Port 1 View  09/10/2011  *RADIOLOGY REPORT*  Clinical Data: Follow up pneumothorax, evaluate chest.  PORTABLE CHEST - 1 VIEW  Comparison: Portable chest x-ray of 09/08/2011  Findings: The left chest tube remains extending to the left lung apex with the tip medially positioned.  No definite pneumothorax is seen.  Basilar opacities have  improved slightly consistent with residual atelectasis left greater than right.  Mild cardiomegaly is stable.  Left chest wall subcutaneous air again is noted.  IMPRESSION: No change in medially positioned left apical chest tube with no definite pneumothorax.  Slightly better aeration.  Original Report Authenticated By: Juline Patch, M.D.   Dg Chest Portable 1 View  09/09/2011  *RADIOLOGY REPORT*  Clinical Data: Status post left-sided chest tube placement for pneumothorax; respiratory distress.  PORTABLE CHEST - 1 VIEW  Comparison: Chest radiograph performed earlier today at 11:25 p.m.  Findings: The lungs are significantly hypoexpanded.  There has been interval placement of a left apical chest tube, with apparent re-  expansion of the left lung.  No definite pneumothorax is now seen. However, the lungs are not well assessed due to the degree of hypoexpansion.  Bilateral airspace opacities could reflect atelectasis or pneumonia, on comparison with prior CT from 08/14/2011.  The unusual pattern of air overlying the mediastinum reflects underlying blebs.  The mediastinum is not well assessed due to adjacent consolidation. No acute osseous abnormalities are identified.  IMPRESSION:  1.  No definite evidence of pneumothorax, status post placement of left apical chest tube. 2.  The lungs are markedly hypoexpanded, with dense bilateral airspace opacities, more prominent on the left.  This could reflect atelectasis or pneumonia, on correlation with prior CT; the unusual pattern of air overlying the mediastinum reflects underlying blebs.  Original Report Authenticated By: Tonia Ghent, M.D.   Dg Chest Portable 1 View  09/08/2011  *RADIOLOGY REPORT*  Clinical Data: Decompression of left pneumothorax.  PORTABLE CHEST - 1 VIEW  Comparison: Earlier the same day  Findings: 2325 hours.  Marked thoracolumbar scoliosis distorts the thoracic and cardiomediastinal anatomy.  There is an apparent angiocath or small sheath no  superimposed on the upper left chest wall.  The volume of gas within the left pleural space has decreased in the interval and there is some re-expansion of the left lung.  The left pneumothorax remains large, but there does appear to be less rightward shift of cardiomediastinal structures compared to the previous film.  There is some persistent to probable atelectasis at the right base. Cardiopericardial silhouette may be enlarged, but the distortion by scoliosis makes assessment difficult. Telemetry leads overlie the chest.  IMPRESSION: Interval evacuation of some of the left pleural air with slightly less rightward shift of cardiomediastinal structures although the marked thoracolumbar scoliosis hinders this assessment.  Right base atelectasis.  Original Report Authenticated By: ERIC A. MANSELL, M.D.   Dg Chest Portable 1 View  09/08/2011  *RADIOLOGY REPORT*  Clinical Data: Respiratory distress.  Shortness of breath.  PORTABLE CHEST - 1 VIEW  Comparison: Chest radiograph performed 08/22/2011  Findings: There is complete collapse of the left lung, with rightward shift of the mediastinum, compatible with a tension pneumothorax.  Right basilar airspace opacification likely reflects residual atelectasis.  No definite pleural effusion is identified.  The cardiomediastinal silhouette is difficult to fully assess due to overlying pneumothorax lucency.  No acute osseous abnormalities are identified.  IMPRESSION:  1.  Complete collapse of the left lung, with rightward shift of the mediastinum, compatible with a tension pneumothorax. 2.  Right basilar airspace opacity likely reflects residual atelectasis.  Critical Value/emergent results were called by telephone at the time of interpretation on 09/08/2011  at 10:58 p.m.  to  Dr. Marliss Czar- Ned Card, who verbally acknowledged these results.  Original Report Authenticated By: Tonia Ghent, M.D.   Dg Chest Port 1 View  08/22/2011  *RADIOLOGY REPORT*  Clinical Data:  46 year old female status post right thoracentesis. Pneumothorax.  PORTABLE CHEST - 1 VIEW  Comparison: 08/20/2011 and earlier.  Findings: Portable upright AP view 0730 hours.  Severe scoliosis. Stable lung volumes.  Mildly improved ventilation at the right lung base with residual confluent probable atelectasis.  No pneumothorax or edema.  Small right pleural effusion.  Stable cardiac size and mediastinal contours.  Visualized tracheal air column is within normal limits.  IMPRESSION: Mildly improved right lung base ventilation.  Residual atelectasis and small effusion.  Original Report Authenticated By: Harley Hallmark, M.D.    Scheduled Meds:    . albuterol  2.5 mg Nebulization TID  . bisacodyl  10 mg Oral Daily  . FLUoxetine  60 mg Oral Daily  . levETIRAcetam  250 mg Oral Daily  . morphine  100 mg Oral Q8H  . mulitivitamin with minerals  1 tablet Oral Daily  . oxybutynin  10 mg Oral Daily  . pantoprazole  40 mg Oral Q1200  . simethicone  80 mg Oral QID  . DISCONTD: fentaNYL  100 mcg Transdermal Q72H  . DISCONTD: morphine  60 mg Oral Q12H   Continuous Infusions:    . dextrose 5 % and 0.45 % NaCl with KCl 20 mEq/L 20 mL/hr at 09/19/11 1800

## 2011-09-21 ENCOUNTER — Inpatient Hospital Stay (HOSPITAL_COMMUNITY): Payer: Medicare Other

## 2011-09-21 NOTE — Progress Notes (Signed)
   CARE MANAGEMENT NOTE 09/21/2011  Patient:  Rhonda Ramos, Rhonda Ramos   Account Number:  [id number]  Date Initiated:  09/16/2011  Documentation initiated by:  GRAVES-BIGELOW,BRENDA  Subjective/Objective Assessment:   Pt admitted with recurrent pneumothoraces. Plan for VATS today.     Action/Plan:   Anticipated DC Date:  09/20/2011   Anticipated DC Plan:  HOME W HOME HEALTH SERVICES      DC Planning Services  CM consult      Choice offered to / List presented to:     DME arranged  [facility]      DME agency  [facility]        Status of service:  Completed, signed off Medicare Important Message given?   (If response is "NO", the following Medicare IM given date fields will be blank) Date Medicare IM given:   Date Additional Medicare IM given:    Discharge Disposition:  HOME/SELF CARE  Per UR Regulation:  Reviewed for med. necessity/level of care/duration of stay  If discussed at Long Length of Stay Meetings, dates discussed:   09/17/2011    Comments:  09/21/2011 1330 Contacted pt and states she is going to her mother's home. NCM explained [facility] will get address from pt. NCM explained portable tank only last 3-4 hours, she will need to contact [facility] when she arrives home to set up oxygen at home. Pt verbalized understanding. Contacted [facility] for oxygen for home. Isidoro Donning RN CCM Case Mgmt phone [phone]  09-17-11 [address] [place] [phone] Pt was discussed in long lenth of stay meeting today.   09-16-11 [address] [place], [place] [phone] CM will continue to monitor for d/c disposition needs.

## 2011-09-21 NOTE — Discharge Summary (Signed)
Physician Discharge Summary  Rhonda Ramos ZOX:[id number] DOB: Jan 31, 1966 DOA: 09/08/2011  PCP: Rene Paci, MD, MD  Admit date: 09/08/2011 Discharge date: 09/21/2011  Discharge Diagnoses:  Principal Problem:  *Spontaneous tension pneumothorax Active Problems:  DEPRESSION  Chronic pain syndrome  URINARY INCONTINENCE  Seizure  Recurrent spontaneous pneumothorax  Pneumonia  Tension pneumothorax   Discharge Condition: stable   Disposition:  PCP in 2 weeks CT surgery 2-4 weeks.  History of present illness:  46 y/o female with spina bifida, scoliosis and chronic pain syndrome who was recently discharged from this facility after a prolonged mechanical ventilatory stay for ARDS related to pneumonia presented to the [facility] ED on 5/6 with acute onset shortness of breath. She was found to have a large left sided pneumothorax with mediastinal shift so needle decompression was performed by the EDP. CTCS (Dr. Cornelius Moras) performed a left chest tube and PCCM was consulted for admission.  Procedures:  -5/6 L chest tube (perCVTS) >> posterior 09/19/2011.  -VATS 09/16/11   Antibiotics:  5/7 vanc (HCAP) >> 5/9  5/7 zosyn (HCAP) >> 5/9  Avelox 5/9 >> 5/11  Cultures / Sepsis markers:  5/7 blood cx >> negative 5/7 urine strep/pneumo ag >> completed course.  Hospital Course:  Principal Problem:  *Spontaneous tension pneumothorax: She was subsequently admitted by the medicine service due to her underlying medical problems and new onset left sided pneumonia. Due to her PTX and effusion.  It was felt that she would require a VATS with stapling of apical blebs. HD #8 the patient was taken to the OR and underwent Left sided VATS with stapling of blebs, apical pleurectomy and mechanical pleurodesis. CT tubes removed, CXR repeated shows no PTX. Will go home on 1 L of O2 for 2 weeks and re-evaluated by CT surgery.  DEPRESSION: Stable no cghange.  Chronic pain syndrome: Required high dose narcotics, during her  hospital stay. Regimen simplyfied to MS contin 3 times a day plus OXy IR PRN.  URINARY INCONTINENCE No change self catherize.   Seizure None, no change.   Pneumonia: Initially treated for HCAP started on VAnc and zosyn then deescalated fot avelox and completed course in house.   Discharge Exam: Filed Vitals:   09/21/11 0725  BP:   Pulse:   Temp: 98.9 F (37.2 C)  Resp:    Filed Vitals:   09/21/11 0300 09/21/11 0400 09/21/11 0442 09/21/11 0725  BP: 96/60     Pulse: 100 99 100   Temp: 99 F (37.2 C)   98.9 F (37.2 C)  TempSrc: Oral   Oral  Resp: 17 18 19    Height:      Weight:      SpO2: 87% 91% 91%    General: Alert, awake, oriented x3, in no acute distress.  HEENT: No bruits, no goiter.  Heart: Regular rate and tachycardic, without murmurs, rubs, gallops.  Lungs: Good air movement, bilateral air movement. Crackles on the right Dressings clean and dry.  Abdomen: Soft, nontender, nondistended, positive bowel sounds.  Neuro: Grossly intact, nonfocal.   Discharge Instructions  Discharge Orders    Future Appointments: Provider: Department: Dept Phone: Center:   10/06/2011 12:00 PM Purcell Nails, MD [facility] [phone] [facility]     Medication List  As of 09/21/2011  8:50 AM   STOP taking these medications         fentaNYL 50 MCG/HR         TAKE these medications  amLODipine 5 MG tablet   Commonly known as: NORVASC   Take 5 mg by mouth daily.      clonazePAM 0.5 MG tablet   Commonly known as: KLONOPIN   Take 0.5 mg by mouth 2 (two) times daily as needed. For anxiety.      FLUoxetine 20 MG tablet   Commonly known as: PROZAC   Take 60 mg by mouth daily.      levETIRAcetam 250 MG tablet   Commonly known as: KEPPRA   Take 250 mg by mouth daily.      morphine 100 MG 12 hr tablet   Commonly known as: MS CONTIN   Take 1 tablet (100 mg total) by mouth every 8 (eight) hours.      mulitivitamin with minerals Tabs   Take 1 tablet by mouth daily.        norethindrone 0.35 MG tablet   Commonly known as: MICRONOR,CAMILA,ERRIN   Take 1 tablet by mouth daily.      omeprazole 20 MG capsule   Commonly known as: PRILOSEC   Take 20 mg by mouth daily.      oxybutynin 10 MG 24 hr tablet   Commonly known as: DITROPAN-XL   Take 10 mg by mouth daily.      Oxycodone HCl 20 MG Tabs   Take 1 tablet (20 mg total) by mouth every 3 (three) hours as needed.      sodium chloride 0.65 % nasal spray   Commonly known as: OCEAN   Place 1 spray into the nose at bedtime as needed. For dry nose      tetrahydrozoline 0.05 % ophthalmic solution   Place 1 drop into both eyes 2 (two) times daily as needed. For dry eyes           Follow-up Information    Follow up with Purcell Nails, MD. (hospital follow up)    Contact information:   [address] [employer] [address] [place] [address] [phone]       Follow up with Rene Paci, MD. (2 weeks hosiptal follow up)    Contact information:   [address] [facility] [address] [place] [address] [phone]           The results of significant diagnostics from this hospitalization (including imaging, microbiology, ancillary and laboratory) are listed below for reference.    Significant Diagnostic Studies: Dg Chest Port 1 View  09/20/2011  *RADIOLOGY REPORT*  Clinical Data: Chest tube removal.  Recent VATS and left pleurodesis.  PORTABLE CHEST - 1 VIEW  Comparison: 09/20/2011 and prior chest radiographs  Findings: A left thoracostomy tube has been removed. No definite pneumothorax identified. Postoperative changes in the left hemithorax/long noted. A right subclavian central venous catheter with tip overlying the upper SVC again noted. Bilateral lower lung atelectasis, right greater left, has slightly increased. Scattered opacities within the left lung may represent atelectasis as well. Mild pulmonary vascular congestion is present.  IMPRESSION: Left  thoracostomy tube removal without definite pneumothorax.  Slightly increased bibasilar atelectasis.  No other significant changes identified.  Original Report Authenticated By: Rosendo Gros, M.D.   Dg Chest Port 1 View  09/20/2011  *RADIOLOGY REPORT*  Clinical Data: Evaluate left-sided pneumothorax  PORTABLE CHEST - 1 VIEW  Comparison: 09/19/2011; 09/18/2011; 09/17/2011  Findings:  Grossly unchanged cardiac silhouette and mediastinal contours. Lung volumes remain persistently reduced.  Stable positioning of the support apparatus.  No pneumothorax.  Interval decrease  in amount of left lateral chest wall subcutaneous emphysema.  Grossly unchanged right basilar linear heterogeneous opacities.  No definite pleural effusion.  Grossly unchanged bones.  IMPRESSION: 1.  Stable positioning of support apparatus.  No pneumothorax. 2.  Persistently reduced lung volumes with persistent right basilar linear opacities favored to represent atelectasis or scar.  Original Report Authenticated By: Waynard Reeds, M.D.   Dg Chest Port 1 View  09/19/2011  *RADIOLOGY REPORT*  Clinical Data: 46 year old female status post a left chest tube removal.  PORTABLE CHEST - 1 VIEW  Comparison: 09/18/2011 and earlier.  Findings: Portable semi upright AP view at 0630 hours.  One left chest tube has been removed, lung remains.  No pneumothorax. Stable left chest wall subcutaneous gas.  Continued low lung volumes.  Stable cardiac size and mediastinal contours.  Visualized tracheal air column is within normal limits. Stable right subclavian approach central line.  Right greater than left lung base atelectasis is stable.  IMPRESSION: 1.  One left chest tube removed, lung remains.  No pneumothorax identified. 2.  Otherwise stable chest.  Original Report Authenticated By: Harley Hallmark, M.D.   Dg Chest Port 1 View  09/18/2011  *RADIOLOGY REPORT*  Clinical Data: Shortness of breath, chest tubes  PORTABLE CHEST - 1 VIEW  Comparison: Portable  chest x-ray of 09/17/2011  Findings: Two left chest tubes remain and there is little change in aeration with basilar atelectasis bilaterally.  No definite pneumothorax is seen.  There is still a small amount of left chest wall subcutaneous emphysema present.  Right central venous line is unchanged in position.  Heart size is stable.  IMPRESSION: No definite pneumothorax with two left chest tubes remaining. Little change in bibasilar atelectasis.  Original Report Authenticated By: Juline Patch, M.D.   Dg Chest Port 1 View  09/17/2011  *RADIOLOGY REPORT*  Clinical Data: Chest tubes.  PORTABLE CHEST - 1 VIEW  Comparison: Chest 09/16/2011 and 09/15/2011.  Findings: Two left chest tubes remain in place.  There may be a tiny left apical pneumothorax.  Subcutaneous air along the left chest wall is noted.  Left basilar atelectasis has improved.  Right basilar atelectasis is increased.  Heart size normal.  IMPRESSION:  1.  Two left chest tubes in place.  There may be a tiny left apical pneumothorax. 2.  Improved left basilar airspace disease. 3.  Increased right basilar atelectasis.  Original Report Authenticated By: Bernadene Bell. D'ALESSIO, M.D.   Dg Chest Portable 1 View  09/16/2011  *RADIOLOGY REPORT*  Clinical Data: Postop left VATS.  Left chest tubes in place.  PORTABLE CHEST - 1 VIEW hours:  Comparison: Portable chest x-ray yesterday and dating back to 09/10/2011.  Findings: 2 left chest tubes in place with small (5% or so) left apical pneumothorax, post VATS presumably for bullectomy, as the large bleb identified on the prior examinations is no longer seen. Markedly suboptimal inspiration with worsening atelectasis in the lung bases.  Right lung otherwise clear.  Right jugular central venous catheter tip in the SVC.  Subcutaneous emphysema in the left chest wall and neck, unchanged.  IMPRESSION:  1.  Small (5% or so) left apical pneumothorax with two chest tubes in place. 2.  Markedly suboptimal inspiration  accounts for worsening bibasilar atelectasis, right greater than left. 3.  Stable small amount of subcutaneous emphysema in the left chest wall and neck.  Original Report Authenticated By: Arnell Sieving, M.D.   Dg Chest Port 1 View  09/15/2011  *  RADIOLOGY REPORT*  Clinical Data: Left chest tube.  PORTABLE CHEST - 1 VIEW  Comparison: 09/14/2011  Findings: Left chest tube remains in place, unchanged. Subcutaneous air is stable.  No visible pneumothorax.  Bibasilar atelectasis.  Heart is normal size.  Severe thoracolumbar scoliosis.  IMPRESSION: Left chest tube remains in stable position without visible pneumothorax.  Bibasilar atelectasis.  Original Report Authenticated By: Cyndie Chime, M.D.   Dg Chest Port 1 View  09/14/2011  *RADIOLOGY REPORT*  Clinical Data: Left-sided pneumothorax.  Chest tube.  PORTABLE CHEST - 1 VIEW  Comparison: Portable chest 09/13/2011.  Findings: A left-sided chest tube remains.  There is no definite pneumothorax.  Subcutaneous air remains.  Bibasilar airspace disease is not significantly changed.  The lung volumes are low.  IMPRESSION:  1.  Stable left-sided chest tube without evidence for residual pneumothorax. 2.  Persistent bibasilar airspace disease, likely reflecting atelectasis. 3.  Low lung volumes.  Original Report Authenticated By: Jamesetta Orleans. MATTERN, M.D.   Dg Chest Port 1 View  09/13/2011  *RADIOLOGY REPORT*  Clinical Data: Evaluate left-sided pneumothorax  PORTABLE CHEST - 1 VIEW  Comparison: 09/12/2011  Findings: The heart size appears normal.  No pleural effusion or edema.  There is a left-sided chest tube in place.  Tiny left pneumothorax along the left heart border is again noted and appears improved from previous exam.  There is extensive left chest wall emphysema.  Lung volumes are low.  There is extensive atelectasis and decreased aeration to both lung bases.  IMPRESSION:  1.  Low lung volumes with bibasilar atelectasis. 2.  Tiny residual pneumothorax  is seen along the left heart border which appears less conspicuous from previous exam.  Original Report Authenticated By: Rosealee Albee, M.D.   Dg Chest Portable 1 View  09/12/2011  *RADIOLOGY REPORT*  Clinical Data: Follow-up left pneumothorax.  PORTABLE CHEST - 1 VIEW  Comparison: 09/11/2011  Findings: Low lung volumes are again demonstrated with persistent bibasilar subsegmental atelectasis.  Left chest tube remains in place, and a tiny residual pneumothorax is seen along the left heart border.  Subcutaneous emphysema again seen in the left chest wall soft tissues.  Heart size remains normal.  IMPRESSION:  1.  Tiny residual pneumothorax seen along the left heart border. 2.  No significant change in bilateral subsegmental atelectasis.  Original Report Authenticated By: Danae Orleans, M.D.   Dg Chest Port 1 View  09/11/2011  *RADIOLOGY REPORT*  Clinical Data: Left chest tube, shortness of breath.  PORTABLE CHEST - 1 VIEW  Comparison: 09/10/2011.  Findings: Trachea is midline.  Heart size is grossly stable.  Left chest tube is in place.  No definite pneumothorax.  Subcutaneous emphysema is seen along the left neck and left chest wall, but has improved slightly in the interval.  Lungs are very low in volume with bibasilar air space disease, as before.  IMPRESSION:  1.  No definite pneumothorax with left chest tube in place. Resolving subcutaneous emphysema. 2.  Low lung volumes with bibasilar air space disease, as before.  Original Report Authenticated By: Reyes Ivan, M.D.   Dg Chest Port 1 View  09/10/2011  *RADIOLOGY REPORT*  Clinical Data: Follow up pneumothorax, evaluate chest.  PORTABLE CHEST - 1 VIEW  Comparison: Portable chest x-ray of 09/08/2011  Findings: The left chest tube remains extending to the left lung apex with the tip medially positioned.  No definite pneumothorax is seen.  Basilar opacities have improved slightly consistent with residual atelectasis  left greater than right.  Mild  cardiomegaly is stable.  Left chest wall subcutaneous air again is noted.  IMPRESSION: No change in medially positioned left apical chest tube with no definite pneumothorax.  Slightly better aeration.  Original Report Authenticated By: Juline Patch, M.D.   Dg Chest Portable 1 View  09/09/2011  *RADIOLOGY REPORT*  Clinical Data: Status post left-sided chest tube placement for pneumothorax; respiratory distress.  PORTABLE CHEST - 1 VIEW  Comparison: Chest radiograph performed earlier today at 11:25 p.m.  Findings: The lungs are significantly hypoexpanded.  There has been interval placement of a left apical chest tube, with apparent re- expansion of the left lung.  No definite pneumothorax is now seen. However, the lungs are not well assessed due to the degree of hypoexpansion.  Bilateral airspace opacities could reflect atelectasis or pneumonia, on comparison with prior CT from 08/14/2011.  The unusual pattern of air overlying the mediastinum reflects underlying blebs.  The mediastinum is not well assessed due to adjacent consolidation. No acute osseous abnormalities are identified.  IMPRESSION:  1.  No definite evidence of pneumothorax, status post placement of left apical chest tube. 2.  The lungs are markedly hypoexpanded, with dense bilateral airspace opacities, more prominent on the left.  This could reflect atelectasis or pneumonia, on correlation with prior CT; the unusual pattern of air overlying the mediastinum reflects underlying blebs.  Original Report Authenticated By: Tonia Ghent, M.D.   Dg Chest Portable 1 View  09/08/2011  *RADIOLOGY REPORT*  Clinical Data: Decompression of left pneumothorax.  PORTABLE CHEST - 1 VIEW  Comparison: Earlier the same day  Findings: 2325 hours.  Marked thoracolumbar scoliosis distorts the thoracic and cardiomediastinal anatomy.  There is an apparent angiocath or small sheath no superimposed on the upper left chest wall.  The volume of gas within the left pleural space  has decreased in the interval and there is some re-expansion of the left lung.  The left pneumothorax remains large, but there does appear to be less rightward shift of cardiomediastinal structures compared to the previous film.  There is some persistent to probable atelectasis at the right base. Cardiopericardial silhouette may be enlarged, but the distortion by scoliosis makes assessment difficult. Telemetry leads overlie the chest.  IMPRESSION: Interval evacuation of some of the left pleural air with slightly less rightward shift of cardiomediastinal structures although the marked thoracolumbar scoliosis hinders this assessment.  Right base atelectasis.  Original Report Authenticated By: ERIC A. MANSELL, M.D.   Dg Chest Portable 1 View  09/08/2011  *RADIOLOGY REPORT*  Clinical Data: Respiratory distress.  Shortness of breath.  PORTABLE CHEST - 1 VIEW  Comparison: Chest radiograph performed 08/22/2011  Findings: There is complete collapse of the left lung, with rightward shift of the mediastinum, compatible with a tension pneumothorax.  Right basilar airspace opacification likely reflects residual atelectasis.  No definite pleural effusion is identified.  The cardiomediastinal silhouette is difficult to fully assess due to overlying pneumothorax lucency.  No acute osseous abnormalities are identified.  IMPRESSION:  1.  Complete collapse of the left lung, with rightward shift of the mediastinum, compatible with a tension pneumothorax. 2.  Right basilar airspace opacity likely reflects residual atelectasis.  Critical Value/emergent results were called by telephone at the time of interpretation on 09/08/2011  at 10:58 p.m.  to  Dr. Marliss Czar- Ned Card, who verbally acknowledged these results.  Original Report Authenticated By: Tonia Ghent, M.D.    Microbiology: Recent Results (from the past 240 hour(s))  SURGICAL PCR SCREEN     Status: Normal   Collection Time   09/16/11  2:44 AM      Component Value Range  Status Comment   MRSA, PCR NEGATIVE  NEGATIVE  Final    Staphylococcus aureus NEGATIVE  NEGATIVE  Final      Labs: Basic Metabolic Panel:  Lab 09/18/11 4782 09/17/11 0500  NA 134* 137  K 4.2 3.9  CL 100 103  CO2 28 24  GLUCOSE 106* 130*  BUN 11 10  CREATININE 0.78 0.75  CALCIUM 10.0 9.7  MG -- --  PHOS -- --   Liver Function Tests:  Lab 09/18/11 0415  AST 17  ALT 23  ALKPHOS 62  BILITOT 0.3  PROT 5.9*  ALBUMIN 2.5*   No results found for this basename: LIPASE:5,AMYLASE:5 in the last 168 hours No results found for this basename: AMMONIA:5 in the last 168 hours CBC:  Lab 09/18/11 0415 09/17/11 0500  WBC 10.8* 7.9  NEUTROABS -- --  HGB 10.3* 10.8*  HCT 31.9* 32.6*  MCV 91.4 89.6  PLT 338 339   Cardiac Enzymes: No results found for this basename: CKTOTAL:5,CKMB:5,CKMBINDEX:5,TROPONINI:5 in the last 168 hours BNP: No components found with this basename: POCBNP:5 CBG: No results found for this basename: GLUCAP:5 in the last 168 hours  Time coordinating discharge: > 30 min  Signed:  Marinda Elk  [facility] 09/21/2011, 8:50 AM

## 2011-09-21 NOTE — Progress Notes (Addendum)
5 Days Post-Op Procedure(s) (LRB): VIDEO ASSISTED THORACOSCOPY (VATS) W/TALC PLEUADESIS (Left)  Subjective: Patient without complaints.  Objective: Vital signs in last 24 hours: Patient Vitals for the past 24 hrs:  BP Temp Temp src Pulse Resp SpO2  09/21/11 0923 - - - - - 96 %  09/21/11 0725 - 98.9 F (37.2 C) Oral - - -  09/21/11 0442 - - - 100  19  91 %  09/21/11 0400 - - - 99  18  91 %  09/21/11 0300 96/60 mmHg 99 F (37.2 C) Oral 100  17  87 %  09/21/11 0130 - - - 113  29  89 %  09/21/11 0000 - - - 102  20  81 %  09/20/11 2300 101/64 mmHg 99.4 F (37.4 C) Oral 99  21  93 %  09/20/11 2130 - - - 122  27  85 %  09/20/11 2045 - - - - - 91 %  09/20/11 2000 - - - 103  22  91 %  09/20/11 1900 115/101 mmHg 99.5 F (37.5 C) Oral 110  18  88 %  09/20/11 1600 93/59 mmHg - - 100  - -  09/20/11 1558 - 99.4 F (37.4 C) Oral - - -  09/20/11 1454 - - - - - 95 %  09/20/11 1159 - 99.4 F (37.4 C) - - - -  09/20/11 1150 104/83 mmHg - - 102  17  91 %    Current Weight  09/17/11 100 lb 15.5 oz (45.8 kg)      Intake/Output from previous day: 05/18 0701 - 05/19 0700 In: 939.7 [P.O.:480; I.V.:459.7] Out: 1575 [Urine:1575]   Physical Exam:  Cardiovascular: RRR. Pulmonary: Slightly diminished at left base; no rales, wheezes, or rhonchi. Abdomen: Soft, non tender, bowel sounds present. Wounds: Dressings clean and dry.    Lab Results: CBC:No results found for this basename: WBC:2,HGB:2,HCT:2,PLT:2 in the last 72 hours BMET: No results found for this basename: NA:2,K:2,CL:2,CO2:2,GLUCOSE:2,BUN:2,CREATININE:2,CALCIUM:2 in the last 72 hours  PT/INR: No results found for this basename: LABPROT,INR in the last 72 hours ABG:  INR: Will add last result for INR, ABG once components are confirmed Will add last 4 CBG results once components are confirmed  Assessment/Plan:  1.Pulmonary-Chest tube removed yesterday.CXR this am shows No pneumothorax, diffuse bilateral airspace disease.On  1L of O2 via [place] as still desats. Will ambulate today.If off O2, ok to discharge home (when ok with medicine);if not, in am.  ZIMMERMAN,[name] MPA-C 09/21/2011 9:44 AM   I have seen and examined the patient and agree with the assessment and plan as outlined.  CXR stable since tube out.  Ready for d/c from surgical standpoint.  D/C plans per medical team.  Purcell Nails 09/21/2011 12:19 PM

## 2011-09-21 NOTE — Progress Notes (Signed)
Oxygen saturation checked on room air for 15 minutes. Patient's O2 sats 86-88 % on room air while sitting in bed. Pt is disabled and does not ambulate well.

## 2011-09-26 ENCOUNTER — Ambulatory Visit: Payer: Self-pay

## 2011-09-26 ENCOUNTER — Ambulatory Visit: Payer: Medicare Other | Admitting: Internal Medicine

## 2011-10-02 ENCOUNTER — Other Ambulatory Visit: Payer: Self-pay | Admitting: Thoracic Surgery (Cardiothoracic Vascular Surgery)

## 2011-10-02 DIAGNOSIS — J939 Pneumothorax, unspecified: Secondary | ICD-10-CM

## 2011-10-03 ENCOUNTER — Other Ambulatory Visit: Payer: Self-pay | Admitting: Thoracic Surgery (Cardiothoracic Vascular Surgery)

## 2011-10-03 DIAGNOSIS — J930 Spontaneous tension pneumothorax: Secondary | ICD-10-CM

## 2011-10-03 DIAGNOSIS — J939 Pneumothorax, unspecified: Secondary | ICD-10-CM

## 2011-10-06 ENCOUNTER — Ambulatory Visit (INDEPENDENT_AMBULATORY_CARE_PROVIDER_SITE_OTHER): Payer: Self-pay | Admitting: Thoracic Surgery (Cardiothoracic Vascular Surgery)

## 2011-10-06 ENCOUNTER — Encounter: Payer: Self-pay | Admitting: Thoracic Surgery (Cardiothoracic Vascular Surgery)

## 2011-10-06 ENCOUNTER — Ambulatory Visit
Admission: RE | Admit: 2011-10-06 | Discharge: 2011-10-06 | Disposition: A | Discharge status: Home / Self Care | Payer: Medicare Other | Source: Ambulatory Visit | Attending: Thoracic Surgery (Cardiothoracic Vascular Surgery) | Admitting: Thoracic Surgery (Cardiothoracic Vascular Surgery)

## 2011-10-06 VITALS — BP 112/70 | HR 83 | Resp 20 | Ht <= 58 in | Wt 100.0 lb

## 2011-10-06 DIAGNOSIS — J930 Spontaneous tension pneumothorax: Secondary | ICD-10-CM

## 2011-10-06 DIAGNOSIS — J939 Pneumothorax, unspecified: Secondary | ICD-10-CM | POA: Insufficient documentation

## 2011-10-06 DIAGNOSIS — Z9889 Other specified postprocedural states: Secondary | ICD-10-CM

## 2011-10-06 DIAGNOSIS — J9383 Other pneumothorax: Secondary | ICD-10-CM

## 2011-10-06 NOTE — Patient Instructions (Signed)
Try using oral nonsteroidal pain relievers such as Naprosyn or ibuprofen in an attempt to wean off of narcotic pain relievers.

## 2011-10-06 NOTE — Progress Notes (Signed)
[address]            [name] [address]          [phone]     CARDIOTHORACIC SURGERY OFFICE NOTE  Referring Provider is David Stall, Darin Engels, MD PCP is Rene Paci, MD, MD   HPI:  Patient returns for followup status post left video-assisted thoracoscopy for stapling of blebs, apical pleurectomy, and mechanical pleurodesis on 09/16/2011. The patient previously underwent right VATS in February of this year by Dr. Tyrone Sage for recurrent right spontaneous pneumothorax. She suffered another right spontaneous pneumothorax after the right VATS in April of this year. In May she presented with first time left spontaneous pneumothorax associated with prolonged air leak and large bleb noted on CT scan.  She has done well since her left VATS.  Her chest tube was removed uneventfully and she was discharged home with her mother on home oxygen therapy. She has continued to improve and she feels that she no longer requires any oxygen. She still has pain in her left chest particularly her left upper shoulder. This has improved somewhat but she still feels that she needs all of her current pain medications. She has no other complaints.   Current Outpatient Prescriptions  Medication Sig Dispense Refill  . amLODipine (NORVASC) 5 MG tablet Take 5 mg by mouth daily.      Marland Kitchen FLUoxetine (PROZAC) 20 MG tablet Take 60 mg by mouth daily.      Marland Kitchen levETIRAcetam (KEPPRA) 250 MG tablet Take 250 mg by mouth daily.      Marland Kitchen morphine (MS CONTIN) 100 MG 12 hr tablet Take 1 tablet (100 mg total) by mouth every 8 (eight) hours.  60 tablet  0  . Multiple Vitamin (MULITIVITAMIN WITH MINERALS) TABS Take 1 tablet by mouth daily.      . norethindrone (MICRONOR,CAMILA,ERRIN) 0.35 MG tablet Take 1 tablet by mouth daily.      Marland Kitchen omeprazole (PRILOSEC) 20 MG capsule Take 20 mg by mouth daily.      Marland Kitchen oxybutynin (DITROPAN-XL) 10 MG 24 hr tablet Take 10 mg by mouth daily.      . Oxycodone HCl 20 MG  TABS Take by mouth 4 (four) times daily as needed.       . sodium chloride (OCEAN) 0.65 % nasal spray Place 1 spray into the nose at bedtime as needed. For dry nose      . tetrahydrozoline 0.05 % ophthalmic solution Place 1 drop into both eyes 2 (two) times daily as needed. For dry eyes      . DISCONTD: potassium chloride (K-DUR) 10 MEQ tablet Take 1 tablet (10 mEq total) by mouth 2 (two) times daily.  10 tablet  1      Physical Exam:   BP 112/70  Pulse 83  Resp 20  Ht 4\' 10"  (1.473 m)  Wt 100 lb (45.36 kg)  BMI 20.90 kg/m2  SpO2 95%  General:  Well-appearing  Chest:   Clear to auscultation  CV:   Regular rate and rhythm  Incisions:  Healed nicely  Abdomen:  Soft and nontender  Extremities:  Warm and well-perfused  Diagnostic Tests:  *RADIOLOGY REPORT*  Clinical Data: Shortness of breath, previous pneumothoraces  CHEST - 2 VIEW  Comparison: 09/21/2011 and earlier studies  Findings: No pneumothorax evident. There is an S-shaped scoliosis  of the thoracolumbar spine with segmentation anomalies in  the upper  and mid thoracic spine as before. Heart size normal. Linear  scarring or atelectasis in the right middle lobe and posterior  right lower lobe, improved since previous exam. The left lung  airspace opacities seen on the most recent prior study have  essentially resolved.  IMPRESSION:  1. Improved bilateral aeration with residual linear scarring or  atelectasis at the right lung base.  2. No pneumothorax.  Original Report Authenticated By: Osa Craver, M.D.    Impression:  Patient is doing well  3 weeks following left VATS for stapling of blebs, apical pleurectomy, and mechanical pleurodesis.   Chest x-ray looks good. She still has expected postoperative pain, and she has a long history of chronic pain with history of narcotic dependence in the past.   Plan:  I've encouraged patient to continue to increase her physical activity as tolerated. I suggested  that she start taking oral nonsteroidal pain relievers for right through pain to try to wean herself off of narcotic pain relievers. In the future she will call and return to see [place] as needed. All of her questions been addressed.   Salvatore Decent. Cornelius Moras, MD 10/06/2011 12:34 PM

## 2011-10-07 ENCOUNTER — Telehealth: Payer: Self-pay | Admitting: Internal Medicine

## 2011-10-07 MED ORDER — OXYCODONE HCL 20 MG PO TABS: 1.0000 | ORAL_TABLET | Freq: Four times a day (QID) | ORAL | Status: DC | PRN

## 2011-10-07 NOTE — Telephone Encounter (Signed)
Chart reveiwed including recent d/c summary  [place] controlled substance registry reviewed  - no discrepancies from hx, does not appear to be diverting or abusing  Ok for oxycodone 20 refill  - same rx as may 19 - Done hardcopy to robin

## 2011-10-07 NOTE — Telephone Encounter (Signed)
Notified pt rx ready for pick-up. Place in cabinet up front... 10/07/11@12 :50pm/LMB

## 2011-10-07 NOTE — Telephone Encounter (Signed)
Caller: Rhonda Ramos/Patient; PCP: Rene Paci;; Call regarding Since Seen in the Office in Febuary she has had Lungs Collapse 2 x, was in coma for 3 days, and Pneumonia. Released From Hosiptal A. Week and 1/2 Ago after having 2 chest tubes inserted and surgery on the L lung. She Was Prescribed MS Contin and Oxycodone for Breakthrough Pain and Now She Is Out of Oxycodone. Rhonda Ramos is needing Refill on the Oxycodone 20 mgs #20. Kern Reap, Neighbor, can pick up Rx-  PLEASE CALL Rhonda Ramos TO LET HER KNOW. CB#: [phone]

## 2011-10-09 ENCOUNTER — Telehealth: Payer: Self-pay | Admitting: Internal Medicine

## 2011-10-09 NOTE — Telephone Encounter (Signed)
Caller: Rhonda Ramos/Patient; PCP: Rene Paci; CB#: [phone]; Call regarding Collapsed Lung x4 since February; MD is aware of the situation; pt is taking pain med for the sx and she had sx on 09/12/11 for collapsed lung; saw surgeon on 10/06/11 and states that she is doing better;   she states that she is having alot of muscle spasms now in her shoulders; she would like to have Flexeril called in for the muscle spasms; denies diff breathing; no shortness of breath; denies sx of collapsed lung now; muscle pain is located on left lower shoulder area posteriorly; muscle pain started when lung collapsed but now she is wanting Flexeril; offered pt to call surgeon but she states that Dr Felicity Coyer is aware of this situation andwill call in med; pt is very hard to get information from her concerning these sx; denies injury; OFFICE PLEASE REVIEW AND CONTACT PT ABOUT [facility] MEDICATION; [facility] [phone]; Triaged per Shoulder Non Injury Guideline; See Provider in 24 hr d/t sever pain with movement that limits normal activities; pt would like to have MD call in medication

## 2011-10-13 MED ORDER — CYCLOBENZAPRINE HCL 10 MG PO TABS
10.0000 mg | ORAL_TABLET | Freq: Three times a day (TID) | ORAL | Status: DC | PRN
Start: 2011-10-13 — End: 2011-11-10

## 2011-10-13 NOTE — Telephone Encounter (Signed)
Notified pt md ok refill for flexeril rx sent to [facility]... 10/13/11@1 :44pm/LMB

## 2011-10-13 NOTE — Telephone Encounter (Signed)
Ok to refill flexeril

## 2011-10-14 ENCOUNTER — Ambulatory Visit (INDEPENDENT_AMBULATORY_CARE_PROVIDER_SITE_OTHER): Payer: Medicare Other | Admitting: Internal Medicine

## 2011-10-14 ENCOUNTER — Encounter: Payer: Self-pay | Admitting: Internal Medicine

## 2011-10-14 VITALS — BP 120/72 | HR 109 | Temp 98.7°F | Ht <= 58 in

## 2011-10-14 DIAGNOSIS — J9383 Other pneumothorax: Secondary | ICD-10-CM

## 2011-10-14 DIAGNOSIS — J939 Pneumothorax, unspecified: Secondary | ICD-10-CM

## 2011-10-14 DIAGNOSIS — F329 Major depressive disorder, single episode, unspecified: Secondary | ICD-10-CM

## 2011-10-14 DIAGNOSIS — G894 Chronic pain syndrome: Secondary | ICD-10-CM

## 2011-10-14 MED ORDER — VENLAFAXINE HCL 37.5 MG PO TABS: ORAL_TABLET | ORAL | Status: DC

## 2011-10-14 MED ORDER — MORPHINE SULFATE ER 100 MG PO TBCR: 100.0000 mg | EXTENDED_RELEASE_TABLET | Freq: Three times a day (TID) | ORAL | Status: DC

## 2011-10-14 NOTE — Patient Instructions (Signed)
It was good to see you today. Med changes as below we'll make referral to new pain clinic and to behavioral health . Our office will contact you regarding appointment(s) once made. Please schedule followup in 6 weeks, call sooner if problems.

## 2011-10-14 NOTE — Assessment & Plan Note (Signed)
Multifactorial - acute on chronic issue Chronic related to LBP, hips and scoli with many prior/remote surgeries Now exacerbated by multiple recurrent and bilateral pneumothorax and interventions/CT Changed to [facility] 09/2011 hosp from duragesic patch  Stop oxy for brkthru pain No relief with tramadol, also would need to use with caution given hx sz event (while tapering off narcotics fall 2011) new referral to pain mgmt - will maintain meds until then

## 2011-10-14 NOTE — Assessment & Plan Note (Signed)
Large component of anxiety as well as depression Change SSRI to SNRI - see orders Will not resume klonopin as prev rx'd - reviewed risk of habit forming tendency for BZs and pt agrees Also refer to psyc and counseling for increased behav health support of PTSD-like events and reaction

## 2011-10-14 NOTE — Assessment & Plan Note (Signed)
follow up last week with CTS - CXR stable hosp and procedures 08/2011 reviewed

## 2011-10-14 NOTE — Progress Notes (Signed)
  Subjective:    Patient ID: Rhonda Ramos, female    DOB: 01-13-1966, 46 y.o.   MRN: [id number]  HPI  Here for 09/2011 hospital follow up - new L spontaneous pneumothorax  hx recurrent R pneumothorax 08/2011 S/p VATS and stapling of blebs, apical pleurectomy, and mechanical pleurodesis on 09/16/2011  complains of continued intense pain : L chest, R chest and chronic back Feels "stable" on current MScontin - not ready to decrease dose, but willing to "give up" oxy  Also increase in depression and anxiety due to recurrent hosp and pneumothorax issues prozac ineffective - on same dose for years   Past Medical History  Diagnosis Date  . Chronic pain syndrome     s/p narc detox fall 2011  . SPINA BIFIDA   . Scoliosis     multiple surg for same  . DEPRESSION     > ptsd (spouse suicide/gsw 11/2005  . GERD   . HYPERTENSION   . Anxiety   . Pneumothorax on right 04/03/11, 06/10/11    recurrent s/p VATS 2/13  . Seizures     onset fall 2011 with narc detox  . Chronic back pain greater than 3 months duration   . 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 Urinary tract infection 08/08/2011    E. coli  . Spontaneous tension pneumothorax 09/17/2011    Left spontaneous pneumothorax - first occurence    Review of Systems  Constitutional: Positive for fatigue. Negative for fever.  Respiratory: Negative for cough and shortness of breath.   Cardiovascular: Positive for chest pain.  Musculoskeletal: Positive for back pain.       Objective:   Physical Exam  BP 120/72  Pulse 109  Temp(Src) 98.7 F (37.1 C) (Oral)  Ht 4\' 10"  (1.473 m)  SpO2 95%  General:  small statured woman, older appearing than stated age. Mom at side Neck:  thick, short - Lungs:  shallow respiratory effort, no intercostal retractions or use of accessory muscles; normal breath sounds bilaterally - no crackles and no wheezes.  Heart:  Slightly tachy rate, but regular  rhythm, no murmur, and no rub. BLE without edema. Msk:  marked scoli deformitiy with left curvature t-spine - multiple well healed scars -  Neurologic:  occ small myoclonic jerk. alert & oriented X3 and cranial nerves II-XII symetrically intact.  strength grossly normal in all extremities, sensation intact to light touch. speech fluent without dysarthria or aphasia; follows commands with good comprehension.  Psych:  tremoulus, depressed and anxious -Oriented X3, memory intact for recent and remote, normally interactive, and good eye contact  Lab Results  Component Value Date   WBC 10.8* 09/18/2011   HGB 10.3* 09/18/2011   HCT 31.9* 09/18/2011   PLT 338 09/18/2011   GLUCOSE 106* 09/18/2011   CHOL 195 07/21/2011   TRIG 223.0* 07/21/2011   HDL 49.80 07/21/2011   LDLDIRECT 112.3 07/21/2011   ALT 23 09/18/2011   AST 17 09/18/2011   NA 134* 09/18/2011   K 4.2 09/18/2011   CL 100 09/18/2011   CREATININE 0.78 09/18/2011   BUN 11 09/18/2011   CO2 28 09/18/2011   TSH 2.74 07/21/2011   INR 0.95 09/16/2011   HGBA1C 5.4 05/08/2008       Assessment & Plan:  See problem list. Medications and labs reviewed today.

## 2011-10-16 ENCOUNTER — Telehealth: Payer: Self-pay | Admitting: *Deleted

## 2011-10-16 MED ORDER — ALPRAZOLAM 0.5 MG PO TABS: 0.5000 mg | ORAL_TABLET | Freq: Three times a day (TID) | ORAL | Status: DC | PRN

## 2011-10-16 NOTE — Telephone Encounter (Signed)
Caller: Rehmat/Patient; PCP: Rene Paci; CB#: [phone]; ; ; Call regarding Hallucinations;   LMP- ceased due to birth control. Patient states she was prescribed Flexeril 10/14/11. Patient states she developed visual and auditory hallucinations since starting Flexeril. States she hears people having a conversation but no one is there. States sx occur whild she is sleeping or trying to sleep. Patient alert and oriented at present. Patient was also started on Effexor 10/14/11.  Triage per Suicidal, Homicidal,or Harmful Behavior and Confusion, Agitation  Protocol. No emergent sx identified. RN also spoke with Mom/Christine. States patient has not been acting right since starting Effexor and Flexeril. States patient is agitated and not sleeping at night.  Care advice given per guidelines.  PATIENT STATES SHE DEVELOPED VISUAL AND AUDITORY HALLUCINATIONS AFTER STARTING FLEXERIL. PATIENT STATES, " I WANT DR. Felicity Coyer TO KNOW THAT I DISCUSSED EVERYTHING WITH MY MOM AND SHE THINKS IT IS A GOOD IDEA TO BE STARTED ON LORAZEPAM AND STOP TAKING FLEXERIL." PATIENT ALSO STATES THAT SHE WAS REFERRED TO PSYCHIATRIST DR. KAUR. STATES SHE WAS SEEN BY DR. KAUR IN THE PAST AND "ALL SHE DID WAS RIGHT ME PRESCRIPTIONS." PATIENT REQUESTING A REFERRAL TO A DIFFERENT PSYCHIATRIST OR A PSYCHOLOGIST. PATIENT CAN BE REACHED @ [phone]. PATIENT USES [facility] ON [address] @ [phone]. RN spoke with Automotive engineer in office. Informed of above and ED disposition.  States will sent a message to Dr. Felicity Coyer and call will be returned to patient.  Mom advised of above, verbalizes understanding.

## 2011-10-16 NOTE — Telephone Encounter (Signed)
Per md to stop taking the effexor and resume back on prozac. Pt has already been rx flexeril not new, and make sure she keep appt with psychiatrist. Called pt gave md advise & recommendations. Pt states that she doesn't want to see Dr. Sharl Ma will only give her meds to take. Would rather see psychologist..Marland Kitchen6/13/13@1 :07pm/LMB

## 2011-10-16 NOTE — Telephone Encounter (Signed)
Caller: Jodeen/Patient; Phone Number: [phone]; Message from caller: Patient states she was speaking with Clydie Braun but was disconnected and told another RN would be calling her or Dr Felicity Coyer.

## 2011-10-16 NOTE — Telephone Encounter (Signed)
Caller: Donald/Patient; PCP: Rene Paci; CB#: [phone]; ; ; Call regarding Question About Medicine;  Pt states she called earlier but there was no mention of the Lorazepam rx she had requested. She wants to know if this is going to be called in. She uses [facility]. A detailed message can be left on voicemail if she does not answer.

## 2011-10-16 NOTE — Telephone Encounter (Signed)
Received call from CAN stating pt is c/o of hallucination since started taking the effexor & flexeril. Hallucination mostly at night. She is allert & oriented. Not suicidal. Her mom states she is very agitated and wanting md to  rx lorazepam..Marland KitchenMarland Kitchen6/13/13@12 :09pm/LMB

## 2011-10-16 NOTE — Telephone Encounter (Signed)
i am reluctant to provide ongoing anxiolytic - but ok for short term use until next ov - please call in xanax 0.5 tid prn, disp 60, no refills Let pt know i INSIST she see dr Evelene Croon for her meds IN ADDITION to therapist

## 2011-10-16 NOTE — Telephone Encounter (Signed)
Notified pt with md response. Called rx into [facility] spoke with cassie. Epic updated... 10/16/11@3 :30pm/LMB

## 2011-11-03 ENCOUNTER — Ambulatory Visit (INDEPENDENT_AMBULATORY_CARE_PROVIDER_SITE_OTHER): Payer: 59 | Admitting: Licensed Clinical Social Worker

## 2011-11-03 ENCOUNTER — Ambulatory Visit: Payer: Medicare Other | Admitting: Licensed Clinical Social Worker

## 2011-11-03 DIAGNOSIS — F411 Generalized anxiety disorder: Secondary | ICD-10-CM

## 2011-11-03 DIAGNOSIS — F331 Major depressive disorder, recurrent, moderate: Secondary | ICD-10-CM

## 2011-11-05 ENCOUNTER — Ambulatory Visit: Payer: Medicare Other | Admitting: Licensed Clinical Social Worker

## 2011-11-10 ENCOUNTER — Ambulatory Visit (INDEPENDENT_AMBULATORY_CARE_PROVIDER_SITE_OTHER): Payer: Medicare Other | Admitting: Internal Medicine

## 2011-11-10 ENCOUNTER — Encounter: Payer: Self-pay | Admitting: Internal Medicine

## 2011-11-10 VITALS — BP 112/76 | HR 108 | Temp 99.3°F | Ht <= 58 in | Wt 101.4 lb

## 2011-11-10 DIAGNOSIS — R209 Unspecified disturbances of skin sensation: Secondary | ICD-10-CM

## 2011-11-10 DIAGNOSIS — G894 Chronic pain syndrome: Secondary | ICD-10-CM

## 2011-11-10 DIAGNOSIS — F329 Major depressive disorder, single episode, unspecified: Secondary | ICD-10-CM

## 2011-11-10 DIAGNOSIS — R200 Anesthesia of skin: Secondary | ICD-10-CM

## 2011-11-10 MED ORDER — CYCLOBENZAPRINE HCL 10 MG PO TABS: 10.0000 mg | ORAL_TABLET | Freq: Three times a day (TID) | ORAL | Status: DC | PRN

## 2011-11-10 MED ORDER — MORPHINE SULFATE ER 100 MG PO TBCR: 100.0000 mg | EXTENDED_RELEASE_TABLET | Freq: Two times a day (BID) | ORAL | Status: DC

## 2011-11-10 MED ORDER — FLUOXETINE HCL 20 MG PO TABS
60.0000 mg | ORAL_TABLET | Freq: Every day | ORAL | Status: DC
Start: 2011-11-10 — End: 2012-09-20

## 2011-11-10 MED ORDER — ALPRAZOLAM 0.5 MG PO TABS: 0.5000 mg | ORAL_TABLET | Freq: Every evening | ORAL | Status: DC | PRN

## 2011-11-10 NOTE — Progress Notes (Signed)
Subjective:    Patient ID: Rhonda Ramos, female    DOB: Jan 23, 1966, 46 y.o.   MRN: [id number]  HPI  Here for follow up  complains of B hand numbness, pain symptoms worse at night - awakens her from sleep  recurrent 09/2011 hosp for new L spontaneous pneumothorax  hx recurrent R pneumothorax 08/2011 S/p VATS and stapling of blebs, apical pleurectomy, and mechanical pleurodesis on 09/16/2011 for L side event  complains of continued intense pain : L chest, R chest and chronic back Feels "stable" on current MScontin - not ready to decrease dose, but willing to "give up" oxy Awaiting appt with pain clinic  chronic depression and anxiety -  increased symptoms due to recurrent hosp and pneumothorax issues prozac ineffective - on same dose for years Effexor caused visual and auditory hallucinations 10/2011 Using xanax with better relief than clonopin since 10/2011   Past Medical History  Diagnosis Date  . Chronic pain syndrome     s/p narc detox fall 2011  . SPINA BIFIDA   . Scoliosis     multiple surg for same  . DEPRESSION     > ptsd (spouse suicide/gsw 11/2005  . GERD   . HYPERTENSION   . Anxiety   . Pneumothorax on right 04/03/11, 06/10/11    recurrent s/p VATS 2/13  . Seizures     onset fall 2011 with narc detox  . Chronic back pain greater than 3 months duration   . 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 Urinary tract infection 08/08/2011    E. coli  . Spontaneous tension pneumothorax 09/17/2011    Left spontaneous pneumothorax - first occurence    Review of Systems  Constitutional: Positive for fatigue. Negative for fever.  Respiratory: Negative for cough and shortness of breath.   Cardiovascular: Positive for chest pain.  Musculoskeletal: Positive for back pain.       Objective:   Physical Exam  BP 112/76  Pulse 108  Temp 99.3 F (37.4 C) (Oral)  Ht 4\' 10"  (1.473 m)  Wt 101 lb 6.4 oz (45.995 kg)  BMI 21.19  kg/m2  SpO2 94%  General:  small statured woman, older appearing than stated age. Mom at side Neck:  thick, short - Lungs:  shallow respiratory effort, no intercostal retractions or use of accessory muscles; normal breath sounds bilaterally - no crackles and no wheezes.  Heart:  Slightly tachy rate, but regular rhythm, no murmur, and no rub. BLE without edema. Msk:  marked scoli deformitiy with left curvature t-spine - multiple well healed scars -  Neurologic:  occ small myoclonic jerk. alert & oriented X3 and cranial nerves II-XII symetrically intact.  strength grossly normal in all extremities but diminished B grip, sensation intact to light touch. speech fluent without dysarthria or aphasia; follows commands with good comprehension.  Psych:  tremoulus, depressed and anxious -Oriented X3, memory intact for recent and remote, normally interactive, and good eye contact  Lab Results  Component Value Date   WBC 10.8* 09/18/2011   HGB 10.3* 09/18/2011   HCT 31.9* 09/18/2011   PLT 338 09/18/2011   GLUCOSE 106* 09/18/2011   CHOL 195 07/21/2011   TRIG 223.0* 07/21/2011   HDL 49.80 07/21/2011   LDLDIRECT 112.3 07/21/2011   ALT 23 09/18/2011   AST 17 09/18/2011   NA 134* 09/18/2011   K 4.2 09/18/2011   CL 100 09/18/2011   CREATININE 0.78 09/18/2011   BUN 11 09/18/2011  CO2 28 09/18/2011   TSH 2.74 07/21/2011   INR 0.95 09/16/2011   HGBA1C 5.4 05/08/2008       Assessment & Plan:  See problem list. Medications and labs reviewed today.  B hand numbness, suspect CTS aggravated by chronic walker use - check NCS-  B wrist splints qhs provided

## 2011-11-10 NOTE — Assessment & Plan Note (Signed)
Large component of anxiety as well as depression Intolerant of effexor trial 10/2011 Continue prozac and therapy Darl Pikes) but refused to see Evelene Croon S/p change klonopin to xanax as prev rx'd - reviewed risk of habit forming tendency for BZs and pt agrees continue counseling for increased behav health support of PTSD-like events and reaction

## 2011-11-10 NOTE — Patient Instructions (Signed)
It was good to see you today. we'll make referral for neve conduction study to look for possible carpal tunnel causing hand symptoms. Our office will contact you regarding appointment(s) once made. Wear wrist splints at bedtime and as discussed - given to you today Continue counseling with Darl Pikes Medications reviewed, no changes at this time. Refill on medication(s) as discussed today. Please schedule followup in 3 months,  call sooner if problems.

## 2011-11-10 NOTE — Assessment & Plan Note (Signed)
-   See "chronic pain"

## 2011-11-13 ENCOUNTER — Encounter: Payer: Self-pay | Admitting: Internal Medicine

## 2011-11-13 ENCOUNTER — Telehealth: Payer: Self-pay | Admitting: *Deleted

## 2011-11-13 DIAGNOSIS — N319 Neuromuscular dysfunction of bladder, unspecified: Secondary | ICD-10-CM | POA: Insufficient documentation

## 2011-11-13 NOTE — Telephone Encounter (Signed)
Pt states md rx lasix for swelling in her feet. Don't understand why her feet keep swelling when she is not on them or doing anything for them to swell. Wanting to know what md advise. Did inform pt md was out of office this afternoon will return tomorrow, and will call back with her response then... 11/13/11@1 :51pm/LMB

## 2011-11-14 MED ORDER — FUROSEMIDE 40 MG PO TABS: 40.0000 mg | ORAL_TABLET | Freq: Every day | ORAL | Status: DC | PRN

## 2011-11-14 NOTE — Telephone Encounter (Signed)
Swelling likely related to summer (common in warmer, humid weather) and/or med side effects - she has no evidence for heart, liver or kidney problems on prior labs/tests Keep elevated feet/legs when at rest and use lasix as rx'd. OV if worse or unimproved with this tx

## 2011-11-14 NOTE — Telephone Encounter (Signed)
Notified pt with md response. Pt states she is needing refill on her lasix. Inform pt will send to her pharmacy... 11/14/11@10 :12am/LMB

## 2011-11-17 ENCOUNTER — Ambulatory Visit (INDEPENDENT_AMBULATORY_CARE_PROVIDER_SITE_OTHER): Payer: 59 | Admitting: Licensed Clinical Social Worker

## 2011-11-17 DIAGNOSIS — F331 Major depressive disorder, recurrent, moderate: Secondary | ICD-10-CM

## 2011-11-17 DIAGNOSIS — F411 Generalized anxiety disorder: Secondary | ICD-10-CM

## 2011-11-26 ENCOUNTER — Ambulatory Visit: Payer: Medicare Other | Admitting: Internal Medicine

## 2011-12-02 ENCOUNTER — Telehealth: Payer: Self-pay | Admitting: *Deleted

## 2011-12-02 NOTE — Telephone Encounter (Signed)
Left msg on triage requesting Ferdie Ping to call [facility] medical supply @ [phone]... 12/02/11@1 :53pm/LMB

## 2011-12-02 NOTE — Telephone Encounter (Signed)
Rhonda Ramos called requesting notes regarding urinary incontinence. Rhonda Ramos advised that pt cancelled OV 07/24, Rhonda Ramos will advised pt to make OV to address incontinence in order to receive catheter supplies from company.

## 2011-12-03 ENCOUNTER — Encounter: Payer: Self-pay | Admitting: Internal Medicine

## 2011-12-03 ENCOUNTER — Ambulatory Visit (INDEPENDENT_AMBULATORY_CARE_PROVIDER_SITE_OTHER): Payer: 59 | Admitting: Internal Medicine

## 2011-12-03 VITALS — BP 120/78 | HR 122 | Temp 99.4°F | Ht <= 58 in | Wt 101.8 lb

## 2011-12-03 DIAGNOSIS — Q059 Spina bifida, unspecified: Secondary | ICD-10-CM

## 2011-12-03 DIAGNOSIS — R32 Unspecified urinary incontinence: Secondary | ICD-10-CM

## 2011-12-03 DIAGNOSIS — G894 Chronic pain syndrome: Secondary | ICD-10-CM

## 2011-12-03 DIAGNOSIS — N319 Neuromuscular dysfunction of bladder, unspecified: Secondary | ICD-10-CM

## 2011-12-03 DIAGNOSIS — G5600 Carpal tunnel syndrome, unspecified upper limb: Secondary | ICD-10-CM

## 2011-12-03 DIAGNOSIS — G5603 Carpal tunnel syndrome, bilateral upper limbs: Secondary | ICD-10-CM | POA: Insufficient documentation

## 2011-12-03 MED ORDER — MORPHINE SULFATE ER 100 MG PO TBCR: 100.0000 mg | EXTENDED_RELEASE_TABLET | Freq: Two times a day (BID) | ORAL | Status: DC

## 2011-12-03 NOTE — Patient Instructions (Signed)
It was good to see you today. Keep hand appointment 12/04/11 as scheduled for carpal tunnel hand pain symptoms. Forms for cath supplies completed and faxed today Continue counseling with Darl Pikes Medications reviewed, no changes at this time. Refill on medication(s) as discussed today. Please keep scheduled followup as planned,  call sooner if problems.

## 2011-12-03 NOTE — Assessment & Plan Note (Signed)
Chronic urinary incont associated with for same Chronic self cath since 1980 -Uses straight tip cath, 4-6x/day, tx planned >99mo (lifetime) Reminded to follow up with neuro and spine specialist for same (not just neuro for sz) -  

## 2011-12-03 NOTE — Progress Notes (Signed)
Subjective:    Patient ID: Rhonda Ramos, female    DOB: Jan 28, 1966, 46 y.o.   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 - pending hand eval 12/04/11 for same  recurrent 09/2011 hosp for new L spontaneous pneumothorax  hx recurrent R pneumothorax 08/2011 S/p VATS and stapling of blebs, apical pleurectomy, and mechanical pleurodesis on 09/16/2011 for L side event  Chronic and continued intense pain : L chest, R chest and chronic back Feels "stable" on current MScontin - not ready to decrease dose, but "gave up" oxy 08/2011  chronic depression and anxiety -  increased symptoms due to recurrent hosp and pneumothorax issues prozac ineffective - on same dose for years Effexor caused visual and auditory hallucinations 10/2011 Using xanax with better relief than clonopin since 10/2011   Past Medical History  Diagnosis Date  . Chronic pain syndrome     s/p narc detox fall 2011  . SPINA BIFIDA   . Scoliosis     multiple surg for same  . DEPRESSION     > ptsd (spouse suicide/gsw 11/2005  . GERD   . HYPERTENSION   . Anxiety   . Pneumothorax on right 04/03/11, 06/10/11    recurrent s/p VATS 2/13  . Seizures     onset fall 2011 with narc detox  . Chronic back pain greater than 3 months duration   . 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 Urinary tract infection 08/08/2011    E. coli  . Spontaneous tension pneumothorax 09/17/2011    Left spontaneous pneumothorax - first occurence    Review of Systems  Constitutional: Positive for fatigue. Negative for fever.  Respiratory: Negative for cough and shortness of breath.   Cardiovascular: Positive for chest pain.   Musculoskeletal: Positive for back pain and gait problem.       Objective:   Physical Exam  BP 120/78  Pulse 122  Temp 99.4 F (37.4 C) (Oral)  Ht 4\' 10"  (1.473 m)  Wt 101 lb 12.8 oz (46.176 kg)  BMI 21.28 kg/m2  SpO2 94%  General:  small statured woman, older appearing than stated age.  Neck:  thick, short - Lungs:  shallow respiratory effort, no intercostal retractions or use of accessory muscles; normal breath sounds bilaterally - no crackles and no wheezes.  Heart:  Slightly tachy rate, but regular rhythm, no murmur, and no rub. BLE without edema. Msk:  marked scoli deformitiy with left curvature t-spine - multiple well healed scars -  Neurologic:  occ small myoclonic jerk. alert & oriented X3 and cranial nerves II-XII symetrically intact.  strength grossly normal in all extremities but diminished B grip, sensation intact to light touch. speech fluent without dysarthria or aphasia; follows commands with good comprehension.  Psych:  tremoulus, depressed and anxious -Oriented X3, memory intact for recent and remote, normally interactive, and good eye contact  Lab Results  Component Value Date   WBC 10.8* 09/18/2011   HGB 10.3* 09/18/2011   HCT 31.9* 09/18/2011   PLT 338 09/18/2011   GLUCOSE 106* 09/18/2011   CHOL 195 07/21/2011   TRIG 223.0* 07/21/2011   HDL 49.80 07/21/2011   LDLDIRECT 112.3 07/21/2011  ALT 23 09/18/2011   AST 17 09/18/2011   NA 134* 09/18/2011   K 4.2 09/18/2011   CL 100 09/18/2011   CREATININE 0.78 09/18/2011   BUN 11 09/18/2011   CO2 28 09/18/2011   TSH 2.74 07/21/2011   INR 0.95 09/16/2011   HGBA1C 5.4 05/08/2008       Assessment & Plan:  See problem list. Medications and labs reviewed today.  Time spent with pt today 25 minutes, greater than 50% time spent counseling patient on urinary issues related to SB, forms completion for supplies, CTS and medication review/refills. Also review of interval records

## 2011-12-03 NOTE — Assessment & Plan Note (Signed)
Multifactorial - acute on chronic issue Chronic related to LBP, hips and scoli with many prior/remote surgeries Now exacerbated by multiple recurrent and bilateral pneumothorax and interventions/CT Changed to [facility] 09/2011 hosp from duragesic patch  Stopped oxy 08/2011 for brkthru pain No relief with tramadol, also would need to use with caution given hx sz event (while tapering off narcotics fall 2011) continue refills per pain mgmt contract

## 2011-12-04 ENCOUNTER — Telehealth: Payer: Self-pay | Admitting: Internal Medicine

## 2011-12-04 NOTE — Telephone Encounter (Signed)
Caller: Espyn/Patient; PCP: Rene Paci; CB#: [phone]; ; ; Call regarding Issues With Obtaining Urinary Catheters;  She states she is having to obtain a new order and documention for a new medical supply Company.  [employer]-  Contact [phone] Bonita Quin).Dr. Felicity Coyer filled out a paper yesterday for the new supply company.  However, they need more information.  They are requesting  (1)   the clinical medical notes, (2) that she is treated with Ditropan, (3) The reason why she is switching to [place] because her other supply is not covered, (4) Reason for having to use Catheters/incontinence.   Please ask Dr. Alberteen Sam to re-fax the reasons/paper to the supply company again.   Patient would like staff to update her [phone], once this done.

## 2011-12-04 NOTE — Telephone Encounter (Signed)
Ok to re-fax and send ov notes (once copy of order is in EMR - scanned yesterday)  1-ok 2-yes 3-as stated (other company not covered) 4 -on rx

## 2011-12-05 NOTE — Telephone Encounter (Signed)
Office note re faxed along with order to company. Also spoke with Bonita Quin at company to inform her we would fax OV notes over.  Pt also informed information needed was sent to company.

## 2011-12-08 ENCOUNTER — Ambulatory Visit: Payer: 59 | Admitting: Licensed Clinical Social Worker

## 2011-12-11 ENCOUNTER — Telehealth: Payer: Self-pay

## 2011-12-11 MED ORDER — ALPRAZOLAM 0.5 MG PO TABS: 0.5000 mg | ORAL_TABLET | Freq: Two times a day (BID) | ORAL | Status: DC | PRN

## 2011-12-11 NOTE — Telephone Encounter (Signed)
Rx updated, Barbara Cower at pharmacy advised of same.

## 2011-12-11 NOTE — Telephone Encounter (Signed)
Pharmacy called requesting clarification on Xanax Rx written 07/08. Per pharmacy, pt says that she takes medication BID but direction are QHS # 60. Pharmacy is requesting clarification on order, please advise.

## 2011-12-11 NOTE — Telephone Encounter (Signed)
BID prn appropriate - thanks

## 2011-12-22 ENCOUNTER — Telehealth: Payer: Self-pay | Admitting: Internal Medicine

## 2011-12-22 ENCOUNTER — Telehealth: Payer: Self-pay | Admitting: *Deleted

## 2011-12-22 NOTE — Telephone Encounter (Signed)
Left msg on triage requesting nerve conduction test results. Called pt back md haven't received results yet will call once md receive & review report... 12/22/11@2 :11pm/LMB

## 2011-12-22 NOTE — Telephone Encounter (Signed)
Rhonda Ramos to inquire - will Dr Ethelene Hal accept this type of patient?

## 2011-12-22 NOTE — Telephone Encounter (Signed)
Pt has been referred to [facility] pain Management  Dr mark And  [facility] pain clinic dr Jordan Likes will not see pt either Per  [place] [facility]  Dr mark  does not feel she's a candidate  For there practice per Lynnea Ferrier  [phone] per [facility] pain clinic ,The patient was a previous patient of Dr. Riley Kill and was discharged from our practice.  We do not accept patient's back after they have been discharged. No one will accept this pt pls advise

## 2011-12-25 NOTE — Telephone Encounter (Signed)
Also please check [place] [facility] - thanks

## 2011-12-29 ENCOUNTER — Ambulatory Visit (INDEPENDENT_AMBULATORY_CARE_PROVIDER_SITE_OTHER): Payer: 59 | Admitting: Licensed Clinical Social Worker

## 2011-12-29 DIAGNOSIS — F411 Generalized anxiety disorder: Secondary | ICD-10-CM

## 2011-12-29 DIAGNOSIS — F331 Major depressive disorder, recurrent, moderate: Secondary | ICD-10-CM

## 2012-01-09 ENCOUNTER — Ambulatory Visit (INDEPENDENT_AMBULATORY_CARE_PROVIDER_SITE_OTHER): Payer: Medicare Other | Admitting: Internal Medicine

## 2012-01-09 ENCOUNTER — Encounter: Payer: Self-pay | Admitting: Internal Medicine

## 2012-01-09 VITALS — BP 130/80 | HR 118 | Temp 98.0°F | Ht <= 58 in | Wt 97.8 lb

## 2012-01-09 DIAGNOSIS — G5603 Carpal tunnel syndrome, bilateral upper limbs: Secondary | ICD-10-CM

## 2012-01-09 DIAGNOSIS — G894 Chronic pain syndrome: Secondary | ICD-10-CM

## 2012-01-09 DIAGNOSIS — F329 Major depressive disorder, single episode, unspecified: Secondary | ICD-10-CM

## 2012-01-09 DIAGNOSIS — G5600 Carpal tunnel syndrome, unspecified upper limb: Secondary | ICD-10-CM

## 2012-01-09 MED ORDER — MORPHINE SULFATE ER 100 MG PO TBCR: 100.0000 mg | EXTENDED_RELEASE_TABLET | Freq: Two times a day (BID) | ORAL | Status: DC

## 2012-01-09 NOTE — Patient Instructions (Signed)
It was good to see you today. we'll make referral to hand specialist for carpal tunnel hand pain symptoms. Our office will contact you regarding appointment(s) once made. Continue counseling with Darl Pikes Medications reviewed, no changes at this time. Refill on medication(s) as discussed today. Please keep scheduled followup as planned,  call sooner if problems.

## 2012-01-09 NOTE — Assessment & Plan Note (Signed)
Multifactorial - acute on chronic issue Chronic related to LBP, hips and scoli with many prior/remote surgeries Now exacerbated by multiple recurrent and bilateral pneumothorax and interventions/CT Changed to [facility] 09/2011 hosp from duragesic patch  Stopped oxy 08/2011 for brkthru pain No relief with tramadol, also would need to use with caution given hx sz event (while tapering off narcotics fall 2011) continue refills per pain mgmt contract pending establishment with new pain clinic

## 2012-01-09 NOTE — Progress Notes (Signed)
  Subjective:    Patient ID: Rhonda Ramos, female    DOB: [birth year], 46 y.o.   MRN: [id number]  HPI  Here for follow up    Past Medical History  Diagnosis Date  . Chronic pain syndrome     s/p narc detox fall 2011  . SPINA BIFIDA   . Scoliosis     multiple surg for same  . DEPRESSION     > ptsd (spouse suicide/gsw 11/2005  . GERD   . HYPERTENSION   . Anxiety   . Pneumothorax on right 04/03/11, 06/10/11    recurrent s/p VATS 2/13  . Seizures     onset fall 2011 with narc detox  . Chronic back pain greater than 3 months duration   . 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 Urinary tract infection 08/08/2011    E. coli  . Spontaneous tension pneumothorax 09/17/2011    Left spontaneous pneumothorax - first occurence    Review of Systems  Constitutional: Positive for fatigue. Negative for fever.  Respiratory: Negative for cough and shortness of breath.   Cardiovascular: Positive for chest pain.  Musculoskeletal: Positive for back pain and gait problem.       Objective:   Physical Exam  BP 130/80  Pulse 118  Temp 98 F (36.7 C) (Oral)  Ht 4\' 10"  (1.473 m)  Wt 97 lb 12.8 oz (44.362 kg)  BMI 20.44 kg/m2  SpO2 92%  General:  small statured woman, older appearing than stated age.  Neck:  thick, short - Lungs:  shallow respiratory effort, no intercostal retractions or use of accessory muscles; normal breath sounds bilaterally - no crackles and no wheezes.  Heart:  Slightly tachy rate, but regular rhythm, no murmur, and no rub. BLE without edema. Msk:  marked scoli deformitiy with left curvature t-spine - multiple well healed scars -  Neurologic:  occ small myoclonic jerk. alert & oriented X3 and cranial nerves II-XII symetrically intact.  strength grossly normal in all extremities but diminished B grip, sensation intact to light touch. speech fluent without dysarthria or aphasia; follows commands with good comprehension.    Psych:  tremoulus, depressed and anxious -Oriented X3, memory intact for recent and remote, normally interactive, and good eye contact  Lab Results  Component Value Date   WBC 10.8* 09/18/2011   HGB 10.3* 09/18/2011   HCT 31.9* 09/18/2011   PLT 338 09/18/2011   GLUCOSE 106* 09/18/2011   CHOL 195 07/21/2011   TRIG 223.0* 07/21/2011   HDL 49.80 07/21/2011   LDLDIRECT 112.3 07/21/2011   ALT 23 09/18/2011   AST 17 09/18/2011   NA 134* 09/18/2011   K 4.2 09/18/2011   CL 100 09/18/2011   CREATININE 0.78 09/18/2011   BUN 11 09/18/2011   CO2 28 09/18/2011   TSH 2.74 07/21/2011   INR 0.95 09/16/2011   HGBA1C 5.4 05/08/2008       Assessment & Plan:  See problem list. Medications and labs reviewed today.  B CTS - mild on NCS 12/11/11 - but remains symptomatic despite wrist splints - will refer to hand specialist given failure to respond to conservative care

## 2012-01-09 NOTE — Assessment & Plan Note (Signed)
See chronic pain Following with Judithe Modest for counseling since 12/2011

## 2012-01-12 ENCOUNTER — Ambulatory Visit: Payer: 59 | Admitting: Licensed Clinical Social Worker

## 2012-02-05 ENCOUNTER — Other Ambulatory Visit: Payer: Self-pay | Admitting: Internal Medicine

## 2012-02-09 ENCOUNTER — Ambulatory Visit: Payer: Medicare Other | Admitting: Internal Medicine

## 2012-02-09 DIAGNOSIS — Z0289 Encounter for other administrative examinations: Secondary | ICD-10-CM

## 2012-02-11 ENCOUNTER — Ambulatory Visit (INDEPENDENT_AMBULATORY_CARE_PROVIDER_SITE_OTHER): Payer: Medicare Other | Admitting: Internal Medicine

## 2012-02-11 ENCOUNTER — Encounter: Payer: Self-pay | Admitting: Internal Medicine

## 2012-02-11 VITALS — BP 122/84 | HR 82 | Temp 99.2°F | Ht <= 58 in | Wt 99.0 lb

## 2012-02-11 DIAGNOSIS — G894 Chronic pain syndrome: Secondary | ICD-10-CM

## 2012-02-11 DIAGNOSIS — N390 Urinary tract infection, site not specified: Secondary | ICD-10-CM

## 2012-02-11 DIAGNOSIS — Q059 Spina bifida, unspecified: Secondary | ICD-10-CM

## 2012-02-11 LAB — POCT URINALYSIS DIPSTICK
Ketones, UA: NEGATIVE
Protein, UA: NEGATIVE
Spec Grav, UA: 1.015
Urobilinogen, UA: 0.2
pH, UA: 6.5

## 2012-02-11 MED ORDER — TRIAMCINOLONE ACETONIDE 0.1 % EX CREA: TOPICAL_CREAM | Freq: Two times a day (BID) | CUTANEOUS | Status: DC

## 2012-02-11 MED ORDER — CIPROFLOXACIN HCL 500 MG PO TABS: 500.0000 mg | ORAL_TABLET | Freq: Two times a day (BID) | ORAL | Status: DC

## 2012-02-11 MED ORDER — MORPHINE SULFATE ER 100 MG PO TBCR: 100.0000 mg | EXTENDED_RELEASE_TABLET | Freq: Two times a day (BID) | ORAL | Status: DC

## 2012-02-11 NOTE — Assessment & Plan Note (Signed)
Chronic urinary incont associated with for same Chronic self cath since 1980 Uses straight tip cath, 4-6x/day, tx planned >7mo (lifetime) Reminded to follow up with neuro and spine specialist for same (not just neuro for sz) Renew handicap parking tag today

## 2012-02-11 NOTE — Patient Instructions (Signed)
It was good to see you today. Keep hand appointment 03/04/12 as scheduled for carpal tunnel hand pain symptoms. Handicap tag application signed today Cipro for bladder infection  Your prescription(s) have been submitted to your pharmacy. Please take as directed and contact our office if you believe you are having problem(s) with the medication(s). Other edications reviewed, no other changes at this time. Refill on medication(s) as discussed today. Please schedule followup in 4 months, call sooner if problems.

## 2012-02-11 NOTE — Progress Notes (Signed)
Subjective:    Patient ID: Rhonda Ramos, female    DOB: Oct 11, 1965, 46 y.o.   MRN: [id number]  Urinary Tract Infection    Here for follow up - reviewed chonic medical issues  spina bifida- chronic urinary incontinence - uses 26F cath 4-6x/day since 1980 - reviewed supplier rx refill for supplies - straight tip, indefinite use for chronic condition (>23mo) -current dysuria, ?UTI  continued B hand numbness, pain symptoms worse at night - awakens her from sleep, but improving with night splints CTS confirmed on PNCS - pending hand eval 03/04/12 for same  recurrent 09/2011 hosp for new L spontaneous pneumothorax  hx recurrent R pneumothorax 08/2011 S/p VATS and stapling of blebs, apical pleurectomy, and mechanical pleurodesis on 09/16/2011 for L side event  Chronic and continued intense pain : L chest, R chest and chronic back Feels "stable" on current MScontin - not ready to decrease dose, but "gave up" oxy 08/2011  chronic depression and anxiety -  increased symptoms due to recurrent hosp and pneumothorax issues prozac ineffective - on same dose for years Effexor caused visual and auditory hallucinations 10/2011 Using xanax with better relief than Klonopin since 10/2011   Past Medical History  Diagnosis Date  . Chronic pain syndrome     s/p narc detox fall 2011  . SPINA BIFIDA   . Scoliosis     multiple surg for same  . DEPRESSION     > ptsd (spouse suicide/gsw 11/2005  . GERD   . HYPERTENSION   . Anxiety   . Pneumothorax on right 04/03/11, 06/10/11    recurrent s/p VATS 2/13  . Seizures     onset fall 2011 with narc detox  . Chronic back pain greater than 3 months duration   . 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 Urinary tract infection 08/08/2011    E. coli  . Spontaneous tension pneumothorax 09/17/2011    Left spontaneous pneumothorax - first occurence    Review of Systems  Constitutional: Positive for fatigue. Negative  for fever.  Respiratory: Negative for cough and shortness of breath.   Cardiovascular: Positive for chest pain.  Musculoskeletal: Positive for back pain and gait problem.       Objective:   Physical Exam  BP 122/84  Pulse 82  Temp 99.2 F (37.3 C) (Oral)  Ht 4\' 10"  (1.473 m)  Wt 99 lb (44.906 kg)  BMI 20.69 kg/m2  SpO2 95%  General:  small statured woman, older appearing than stated age.  Neck:  thick, short - Lungs:  shallow respiratory effort, no intercostal retractions or use of accessory muscles; normal breath sounds bilaterally - no crackles and no wheezes.  Heart:  Slightly tachy rate, but regular rhythm, no murmur and no rub. BLE without edema. Msk:  marked scoli deformitiy with left curvature t-spine - multiple well healed scars -  Neurologic:  alert & oriented X3 and cranial nerves II-XII symetrically intact.  strength grossly normal in all extremities - fair and symmetric B grip, sensation intact to light touch. speech fluent without dysarthria or aphasia; follows commands with good comprehension.  Psych:  Mildly tremoulus, dysphoric and anxious -Oriented X3, memory intact for recent and remote, normally interactive, and good eye contact  Lab Results  Component Value Date   WBC 10.8* 09/18/2011   HGB 10.3* 09/18/2011   HCT 31.9* 09/18/2011   PLT 338 09/18/2011   GLUCOSE 106* 09/18/2011   CHOL 195 07/21/2011   TRIG  223.0* 07/21/2011   HDL 49.80 07/21/2011   LDLDIRECT 112.3 07/21/2011   ALT 23 09/18/2011   AST 17 09/18/2011   NA 134* 09/18/2011   K 4.2 09/18/2011   CL 100 09/18/2011   CREATININE 0.78 09/18/2011   BUN 11 09/18/2011   CO2 28 09/18/2011   TSH 2.74 07/21/2011   INR 0.95 09/16/2011   HGBA1C 5.4 05/08/2008       Assessment & Plan:  See problem list. Medications and labs reviewed today.  UTI - classic symptoms, suspect element of colonization with self-catheterization and neurogenic bladder but given symptomatic complaints, will treat with Cipro x one week

## 2012-02-11 NOTE — Assessment & Plan Note (Signed)
Multifactorial - acute on chronic issue Chronic related to LBP, hips and scoli with many prior/remote surgeries then exacerbated spring/summer 2013 with multiple recurrent and bilateral pneumothorax and interventions/chest tube Changed to [facility] 09/2011 hosp from duragesic patch  Stopped oxy 08/2011 for brkthru pain No relief with tramadol, also would need to use with caution given hx sz event (while tapering off narcotics fall 2011) continue refills per pain mgmt contract

## 2012-02-16 ENCOUNTER — Ambulatory Visit: Payer: Self-pay | Admitting: Licensed Clinical Social Worker

## 2012-02-20 ENCOUNTER — Ambulatory Visit (INDEPENDENT_AMBULATORY_CARE_PROVIDER_SITE_OTHER): Payer: 59 | Admitting: Licensed Clinical Social Worker

## 2012-02-20 DIAGNOSIS — F331 Major depressive disorder, recurrent, moderate: Secondary | ICD-10-CM

## 2012-02-20 DIAGNOSIS — F411 Generalized anxiety disorder: Secondary | ICD-10-CM

## 2012-03-05 ENCOUNTER — Ambulatory Visit: Payer: Self-pay | Admitting: Licensed Clinical Social Worker

## 2012-03-11 ENCOUNTER — Other Ambulatory Visit: Payer: Self-pay | Admitting: *Deleted

## 2012-03-11 MED ORDER — MORPHINE SULFATE ER 100 MG PO TBCR: 100.0000 mg | EXTENDED_RELEASE_TABLET | Freq: Two times a day (BID) | ORAL | Status: DC

## 2012-03-11 NOTE — Telephone Encounter (Signed)
Pt called for refill of Morphine-last written 02/11/2012 #60 with 0 refills-please advise.

## 2012-03-11 NOTE — Telephone Encounter (Signed)
ok 

## 2012-03-12 NOTE — Telephone Encounter (Signed)
Pt informed rx ready for pickup, rx placed upfront in cabinet. 

## 2012-03-15 ENCOUNTER — Ambulatory Visit (INDEPENDENT_AMBULATORY_CARE_PROVIDER_SITE_OTHER): Payer: 59 | Admitting: Licensed Clinical Social Worker

## 2012-03-15 DIAGNOSIS — F331 Major depressive disorder, recurrent, moderate: Secondary | ICD-10-CM

## 2012-03-15 DIAGNOSIS — F411 Generalized anxiety disorder: Secondary | ICD-10-CM

## 2012-03-25 ENCOUNTER — Telehealth: Payer: Self-pay | Admitting: Internal Medicine

## 2012-03-25 DIAGNOSIS — R05 Cough: Secondary | ICD-10-CM

## 2012-03-25 NOTE — Telephone Encounter (Signed)
Patient Information:  Caller Name: Karrin  Phone: [phone]  Patient: Rhonda Ramos, Rhonda Ramos  Gender: Female  DOB: June 17, 1965  Age: 46 Years  PCP: Rene Paci (Adults only)  Pregnant: No   Symptoms  Reason For Call & Symptoms: Patient states she has lung issues - (recurrent pneumothoraxes in this year). She states onset of coughing , yesterday 03/14/12. productive white.  Reviewed Health History In EMR: Yes  Reviewed Medications In EMR: Yes  Reviewed Allergies In EMR: Yes  Date of Onset of Symptoms: 03/24/2012 OB:  LMP: Unknown  Guideline(s) Used:  Cough  Disposition Per Guideline:   See Today in Office  Reason For Disposition Reached:   Known COPD or other severe lung disease (i.e., bronchiectasis, cystic fibrosis, lung surgery) and worsening symptoms (i.e., increased sputum purulence or amount, increased breathing difficulty)  Advice Given:  Caution - Dextromethorphan:   Do not try to completely suppress coughs that produce mucus and phlegm. Remember that coughing is helpful in bringing up mucus from the lungs and preventing pneumonia.  Coughing Spasms:  Drink warm fluids. Inhale warm mist&nbsp;(Reason: both relax the airway and loosen up the phlegm).  Suck on cough drops or hard candy to coat the irritated throat.  Prevent Dehydration:  Drink adequate liquids.  This will help soothe an irritated or dry throat and loosen up the phlegm.  Avoid Tobacco Smoke:  Smoking or being exposed to smoke makes coughs much worse.  Fever Medicines:  For fevers above 101 F (38.3 C) take either acetaminophen or ibuprofen.  They are over-the-counter (OTC) drugs that help treat both fever and pain. You can buy them at the drugstore.l  Call Back If:  Difficulty breathing  Cough lasts more than 3 weeks  Fever lasts &gt; 3 days  You become worse.  Office Follow Up:  Does the office need to follow up with this patient?: Yes  Instructions For The Office: Patient request Rx for cough and  and inhalers  Patient Refused Recommendation:  Patient Requests Prescription  Patient does not have a ride and does not feel like coming to the office.  RN Note:  Patient does not want to come to the office/no way and does not feel like coming. She is wanting something cough Rx and inhaler Rx .  She is currently using a rescue inhaler- She believes it is Albuterol.  Which helps her. She got from one of her  discharges at the hospital. PATIENT STATES SHE KNOWS WHEN TO GO TO THE HOSPITAL after several pneumothoraxes and will not hesistate to go.  ASKING FOR MEDICATION RX.

## 2012-03-26 ENCOUNTER — Telehealth: Payer: Self-pay | Admitting: Internal Medicine

## 2012-03-26 MED ORDER — ALBUTEROL SULFATE HFA 108 (90 BASE) MCG/ACT IN AERS: 2.0000 | INHALATION_SPRAY | Freq: Four times a day (QID) | RESPIRATORY_TRACT | Status: DC | PRN

## 2012-03-26 MED ORDER — HYDROCOD POLST-CHLORPHEN POLST 10-8 MG/5ML PO LQCR: 5.0000 mL | Freq: Two times a day (BID) | ORAL | Status: DC | PRN

## 2012-03-26 MED ORDER — HYDROCODONE-HOMATROPINE 5-1.5 MG/5ML PO SYRP: 5.0000 mL | ORAL_SOLUTION | Freq: Three times a day (TID) | ORAL | Status: DC | PRN

## 2012-03-26 NOTE — Telephone Encounter (Signed)
Pt advised, Rx faxed to pharmacy 

## 2012-03-26 NOTE — Telephone Encounter (Signed)
Ok see orders

## 2012-03-26 NOTE — Telephone Encounter (Signed)
Ok

## 2012-03-26 NOTE — Telephone Encounter (Signed)
Notified pt rx's sent to pharmacy.../lmb 

## 2012-03-26 NOTE — Telephone Encounter (Signed)
Patient states Dr. Felicity Coyer called her in Tussinex yesterday for cough and congestion. She is unable to purchase the Tussionex due to the cost.  She is requesting instead the alternative Hycodan. Patient requesting medication alternative. Pharmacy [facility] [phone].  PLEASE CONTACT.

## 2012-03-29 ENCOUNTER — Ambulatory Visit: Payer: 59 | Admitting: Licensed Clinical Social Worker

## 2012-04-12 ENCOUNTER — Telehealth: Payer: Self-pay | Admitting: Internal Medicine

## 2012-04-12 MED ORDER — MORPHINE SULFATE ER 100 MG PO TBCR: 100.0000 mg | EXTENDED_RELEASE_TABLET | Freq: Two times a day (BID) | ORAL | Status: DC

## 2012-04-12 NOTE — Telephone Encounter (Signed)
Ok thanks done

## 2012-04-12 NOTE — Telephone Encounter (Signed)
Patient calls to request prescription be written for MS Contin 100 mg BID for pick up. Has enough through Wednesday morning 1211/13.  Please notify patient when ready.  Patient of Dr. Felicity Coyer.  Last office visit was 02/11/12

## 2012-04-12 NOTE — Telephone Encounter (Signed)
Notified pt rx ready for pick-up.../lmb 

## 2012-04-18 ENCOUNTER — Other Ambulatory Visit: Payer: Self-pay | Admitting: Internal Medicine

## 2012-05-02 ENCOUNTER — Other Ambulatory Visit: Payer: Self-pay | Admitting: Internal Medicine

## 2012-05-03 ENCOUNTER — Other Ambulatory Visit: Payer: Self-pay | Admitting: *Deleted

## 2012-05-03 NOTE — Telephone Encounter (Signed)
Last written 12/11/2011 #60 with 5 refills-please advise in VAL's absence-okay to refill?

## 2012-05-03 NOTE — Telephone Encounter (Signed)
Patient called again concerning refill on alprazolam. Leaving town tomorrow at 1 pm.

## 2012-05-03 NOTE — Telephone Encounter (Signed)
OK to fill this prescription with additional refills x5 Thank you!  

## 2012-05-04 ENCOUNTER — Other Ambulatory Visit: Payer: Self-pay | Admitting: Internal Medicine

## 2012-05-04 MED ORDER — ALPRAZOLAM 0.5 MG PO TABS: 0.5000 mg | ORAL_TABLET | Freq: Two times a day (BID) | ORAL | Status: DC | PRN

## 2012-05-04 NOTE — Telephone Encounter (Signed)
Faxed script back to [facility]...lmb 

## 2012-05-04 NOTE — Telephone Encounter (Signed)
Patient notified of alprazolam Rx called to [facility].

## 2012-05-04 NOTE — Telephone Encounter (Signed)
MD out of office. Is this ok to refill.../lmb 

## 2012-05-04 NOTE — Telephone Encounter (Signed)
Valentina Gu, OK to refill, I will place on your desk.  Rene Kocher

## 2012-05-06 ENCOUNTER — Telehealth: Payer: Self-pay | Admitting: *Deleted

## 2012-05-06 NOTE — Telephone Encounter (Signed)
Left msg on triage wanting refill on her MS contin...Raechel Chute

## 2012-05-07 MED ORDER — MORPHINE SULFATE ER 100 MG PO TBCR: 100.0000 mg | EXTENDED_RELEASE_TABLET | Freq: Two times a day (BID) | ORAL | Status: DC

## 2012-05-07 NOTE — Telephone Encounter (Signed)
ok 

## 2012-05-07 NOTE — Telephone Encounter (Signed)
Notified pt rx ready for pick-up.../lmb 

## 2012-05-19 ENCOUNTER — Telehealth: Payer: Self-pay | Admitting: Internal Medicine

## 2012-05-19 NOTE — Telephone Encounter (Signed)
[facility] Triage Call Report Triage Record Num: [id number] Operator: Candida Peeling Patient Name: Rhonda Ramos Call Date & Time: 05/17/2012 6:49:50PM Patient Phone: [phone] PCP: Rene Paci Patient Gender: Female PCP Fax : [phone] Patient DOB: 05-Nov-1965 Practice Name: [facility] Reason for Call: Caller: Ardythe/Patient; PCP: Rene Paci (Adults only); CB#: [phone]; Call regarding Cough/Congestion; Onset 05/17/12 has been coughing up greenish yellow mucous. Began coughing last night, took Robitussin. Afebrile. Wanting an antibiotic. Has just used inhaler and feeling somewhat better. All emergent sxs r/o per Cough protocol w/ the excepton of "Productive cough with colored sputum (other than clear or white sputum)". RN instructed evaluation in 24 hours and offered to schedule an appt. Pt declines appt states she will call in AM once she is up and sees how she feels. Protocol(s) Used: Cough - Adult Recommended Outcome per Protocol: See Provider within 24 hours Reason for Outcome: Productive cough with colored sputum (other than clear or white sputum) Care Advice: ~ Use a cool mist humidifier to moisten air. Be sure to clean according to manufacturer's instructions. Limit or avoid exposure to irritants and allergens (e.g. air pollution, smoke/smoking, chemicals, dust, pollen, pet dander, etc.) Increase fluids to 8-12 eight oz (1.6 to 2.4 liters) glasses per day, half of them to be water. Soups, popsicles, fruit juices, non-caffeinated sodas (unless restricting sodium intake), jello, broths, decaf teas, etc. are all okay. Warm fluids can be soothing. ~ If you can, stop smoking now and avoid all secondhand smoke. Coughing up mucus or phlegm helps to get rid of an infection. A productive cough should not be stopped. A cough medicine with guaifenesin (Robitussin, Mucinex) can help loosen the mucus. Cough medicine with dextromethorphan (DM) should be avoided. Drinking lots of  fluids can help loosen the mucus too, especially warm fluids.

## 2012-05-19 NOTE — Telephone Encounter (Signed)
Noted and agree.-thx 

## 2012-06-02 ENCOUNTER — Other Ambulatory Visit: Payer: Self-pay | Admitting: *Deleted

## 2012-06-02 MED ORDER — MORPHINE SULFATE ER 100 MG PO TBCR: 100.0000 mg | EXTENDED_RELEASE_TABLET | Freq: Two times a day (BID) | ORAL | Status: DC

## 2012-06-02 NOTE — Telephone Encounter (Signed)
Pt is requesting refill of MS Contin to pickup before end of Friday. She is going out of town and wants to make sure she can get it filled-last written 05/07/2012 #60 with 0 refills.

## 2012-06-02 NOTE — Telephone Encounter (Signed)
ok 

## 2012-06-02 NOTE — Telephone Encounter (Signed)
Pt informed rx ready for pickup. Rx placed upfront in cabinet.  

## 2012-06-29 ENCOUNTER — Telehealth: Payer: Self-pay

## 2012-06-29 DIAGNOSIS — G894 Chronic pain syndrome: Secondary | ICD-10-CM

## 2012-06-29 DIAGNOSIS — Q059 Spina bifida, unspecified: Secondary | ICD-10-CM

## 2012-06-29 MED ORDER — MORPHINE SULFATE ER 100 MG PO TBCR: 100.0000 mg | EXTENDED_RELEASE_TABLET | Freq: Two times a day (BID) | ORAL | Status: DC

## 2012-06-29 NOTE — Telephone Encounter (Signed)
Notified pt rx ready for pick-up also fax note for wheelchair repair...Raechel Chute

## 2012-06-29 NOTE — Telephone Encounter (Signed)
Call regarding: written rx to have her battery serviced on her power wheelchair; needs fax sent to [phone] and says "power wheelchair repair" and needs Dr.Lescher's name on it; also calling about rx for MS Contin; says she is not out, but will be over the w/e, 07/03/12; would like a call back when her rx is available for pick-up

## 2012-06-29 NOTE — Telephone Encounter (Signed)
Ok to generate battery rx as requested MS rx done

## 2012-06-29 NOTE — Telephone Encounter (Signed)
Pt called requesting a written Rx for "power wheelchair repair" faxed to repair company at [phone]. Pt says the battery of her electric wheelchair requires servicing.

## 2012-07-19 ENCOUNTER — Ambulatory Visit: Payer: Self-pay | Admitting: Internal Medicine

## 2012-07-21 ENCOUNTER — Ambulatory Visit: Payer: Self-pay | Admitting: Internal Medicine

## 2012-07-23 ENCOUNTER — Ambulatory Visit (INDEPENDENT_AMBULATORY_CARE_PROVIDER_SITE_OTHER): Payer: 59 | Admitting: Internal Medicine

## 2012-07-23 ENCOUNTER — Encounter: Payer: Self-pay | Admitting: Internal Medicine

## 2012-07-23 VITALS — BP 102/72 | HR 88 | Temp 98.2°F | Wt 113.8 lb

## 2012-07-23 DIAGNOSIS — N319 Neuromuscular dysfunction of bladder, unspecified: Secondary | ICD-10-CM

## 2012-07-23 DIAGNOSIS — Q059 Spina bifida, unspecified: Secondary | ICD-10-CM

## 2012-07-23 DIAGNOSIS — G894 Chronic pain syndrome: Secondary | ICD-10-CM

## 2012-07-23 MED ORDER — ALPRAZOLAM 0.5 MG PO TABS: 0.5000 mg | ORAL_TABLET | Freq: Two times a day (BID) | ORAL | Status: DC | PRN

## 2012-07-23 MED ORDER — MORPHINE SULFATE ER 100 MG PO TBCR: 100.0000 mg | EXTENDED_RELEASE_TABLET | Freq: Two times a day (BID) | ORAL | Status: DC

## 2012-07-23 MED ORDER — OXYBUTYNIN CHLORIDE ER 10 MG PO TB24: 10.0000 mg | ORAL_TABLET | Freq: Every day | ORAL | Status: DC
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
## 2012-07-23 NOTE — Patient Instructions (Addendum)
It was good to see you today. We will complete your prescription for [place] when we receive the paperwork - exam today done for same Medications reviewed and updated, no changes at this time.  Refill on medication(s) as discussed today. Please schedule followup in 6 months, call sooner if problems.

## 2012-08-03 ENCOUNTER — Telehealth: Payer: Self-pay | Admitting: Internal Medicine

## 2012-08-03 NOTE — Telephone Encounter (Signed)
Patient Information:  Caller Name: Jennye  Phone: [phone]  Patient: Rhonda Ramos, Rhonda Ramos  Gender: Female  DOB: 01-21-1966  Age: 47 Years  PCP: Rene Paci (Adults only)  Pregnant: No  Office Follow Up:  Does the office need to follow up with this patient?: Yes  Instructions For The Office: Wants the  Hycodone Cough syrup called in.  May use the Tessalon Pearles that she has on hand.   Symptoms  Reason For Call & Symptoms: Patient calling, has had a cough that kept her up all night.  It seemed to improve and then she started coughing again.  She is asking for a cough medication, Hycodan Cough medication that she used last year.  Reviewed Health History In EMR: Yes  Reviewed Medications In EMR: Yes  Reviewed Allergies In EMR: Yes  Reviewed Surgeries / Procedures: Yes  Date of Onset of Symptoms: 08/02/2012  Treatments Tried: Robitussin DM  Treatments Tried Worked: No OB / GYN:  LMP: Unknown  Guideline(s) Used:  Cough  Disposition Per Guideline:   See Today in Office  Reason For Disposition Reached:   Known COPD or other severe lung disease (i.e., bronchiectasis, cystic fibrosis, lung surgery) and worsening symptoms (i.e., increased sputum purulence or amount, increased breathing difficulty)  Advice Given:  N/A  Patient Refused Recommendation:  Patient Will Follow Up With Office Later  Refuses to go, will call back tomorrow.

## 2012-08-04 NOTE — Telephone Encounter (Signed)
Noted thanks °

## 2012-08-14 ENCOUNTER — Other Ambulatory Visit: Payer: Self-pay | Admitting: Internal Medicine

## 2012-08-20 ENCOUNTER — Ambulatory Visit: Payer: Self-pay | Admitting: Nurse Practitioner

## 2012-08-23 ENCOUNTER — Ambulatory Visit (INDEPENDENT_AMBULATORY_CARE_PROVIDER_SITE_OTHER): Payer: 59 | Admitting: Licensed Clinical Social Worker

## 2012-08-23 DIAGNOSIS — F331 Major depressive disorder, recurrent, moderate: Secondary | ICD-10-CM

## 2012-08-23 DIAGNOSIS — F411 Generalized anxiety disorder: Secondary | ICD-10-CM

## 2012-08-24 ENCOUNTER — Telehealth: Payer: Self-pay | Admitting: Internal Medicine

## 2012-08-24 NOTE — Telephone Encounter (Signed)
Caller: Oktober/Patient; Phone: [phone]; Reason for Call: Patient calling about refill for medication MS Contin.  States will need refill 08/28/12 for her monthly Rx.  Info to office for provider review/Rx/callback.  May reach patient at [phone].  Krs/can

## 2012-08-25 MED ORDER — MORPHINE SULFATE ER 100 MG PO TBCR: 100.0000 mg | EXTENDED_RELEASE_TABLET | Freq: Two times a day (BID) | ORAL | Status: DC

## 2012-08-25 NOTE — Telephone Encounter (Signed)
Notified pt rx ready for pick-up.../lmb 

## 2012-08-25 NOTE — Telephone Encounter (Signed)
Ok, noted and done - thanks

## 2012-09-20 ENCOUNTER — Encounter: Payer: Self-pay | Admitting: Internal Medicine

## 2012-09-20 ENCOUNTER — Other Ambulatory Visit (INDEPENDENT_AMBULATORY_CARE_PROVIDER_SITE_OTHER): Payer: Medicare Other

## 2012-09-20 ENCOUNTER — Ambulatory Visit (INDEPENDENT_AMBULATORY_CARE_PROVIDER_SITE_OTHER): Payer: 59 | Admitting: Internal Medicine

## 2012-09-20 VITALS — BP 132/80 | HR 125 | Temp 98.6°F | Wt 112.4 lb

## 2012-09-20 DIAGNOSIS — R5381 Other malaise: Secondary | ICD-10-CM

## 2012-09-20 DIAGNOSIS — R5383 Other fatigue: Secondary | ICD-10-CM

## 2012-09-20 DIAGNOSIS — G894 Chronic pain syndrome: Secondary | ICD-10-CM

## 2012-09-20 DIAGNOSIS — Q059 Spina bifida, unspecified: Secondary | ICD-10-CM

## 2012-09-20 LAB — CBC WITH DIFFERENTIAL/PLATELET
Basophils Relative: 0.5 % (ref 0.0–3.0)
Eosinophils Absolute: 0.1 10*3/uL (ref 0.0–0.7)
Eosinophils Relative: 0.8 % (ref 0.0–5.0)
HCT: 41.6 % (ref 36.0–46.0)
Lymphs Abs: 2.6 10*3/uL (ref 0.7–4.0)
MCHC: 34.7 g/dL (ref 30.0–36.0)
MCV: 87.8 fl (ref 78.0–100.0)
Monocytes Absolute: 0.4 10*3/uL (ref 0.1–1.0)
Neutro Abs: 6.5 10*3/uL (ref 1.4–7.7)
Neutrophils Relative %: 67.8 % (ref 43.0–77.0)
RBC: 4.74 Mil/uL (ref 3.87–5.11)

## 2012-09-20 LAB — BASIC METABOLIC PANEL
BUN: 9 mg/dL (ref 6–23)
CO2: 20 mEq/L (ref 19–32)
Chloride: 105 mEq/L (ref 96–112)
Creatinine, Ser: 1 mg/dL (ref 0.4–1.2)
Potassium: 3.4 mEq/L — ABNORMAL LOW (ref 3.5–5.1)

## 2012-09-20 LAB — HEPATIC FUNCTION PANEL: Total Bilirubin: 0.2 mg/dL — ABNORMAL LOW (ref 0.3–1.2)

## 2012-09-20 LAB — TSH: TSH: 1.03 u[IU]/mL (ref 0.35–5.50)

## 2012-09-20 MED ORDER — MORPHINE SULFATE ER 100 MG PO TBCR: 100.0000 mg | EXTENDED_RELEASE_TABLET | Freq: Two times a day (BID) | ORAL | Status: DC

## 2012-09-20 NOTE — Assessment & Plan Note (Signed)
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

## 2012-09-20 NOTE — Assessment & Plan Note (Signed)
Multifactorial - acute on chronic issue Chronic related to LBP, hips and scoli with many prior/remote surgeries then exacerbated spring/summer 2013 with multiple recurrent and bilateral pneumothorax and interventions/chest tube Changed to [facility] 09/2011 hosp from duragesic patch  Stopped oxy 08/2011 for brkthru pain No relief with tramadol, also would need to use with caution given hx sz event (while tapering off narcotics fall 2011) continue refills per pain mgmt contract  - 8 "extra" tabs given today to fill now, pt understands and agrees another violation will prompt dismissal from my practice

## 2012-09-20 NOTE — Patient Instructions (Signed)
It was good to see you today. We have reviewed your prior records including labs and tests today Prescription for 8 extra tablets of MS Contin provided one time today, also refill on monthly supply - these prescriptions have been given to you to submit to your pharmacy. You understand that any additional lost narcotic/controlled substance prescriptions will result in dismissal from this practice immediately Medications reviewed and updated, no changes at this time.  Test(s) ordered today. Your results will be released to MyChart (or called to you) after review, usually within 72hours after test completion. If any changes need to be made, you will be notified at that same time. Handicap placard application signed and given to you today Please keep scheduled followup in 6 months, call sooner if problems.
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
## 2012-09-21 ENCOUNTER — Ambulatory Visit: Payer: Self-pay | Admitting: Internal Medicine

## 2012-10-19 ENCOUNTER — Telehealth: Payer: Self-pay

## 2012-10-19 MED ORDER — MORPHINE SULFATE ER 100 MG PO TBCR: 100.0000 mg | EXTENDED_RELEASE_TABLET | Freq: Two times a day (BID) | ORAL | Status: DC

## 2012-10-19 NOTE — Telephone Encounter (Signed)
Patient called [facility] requesting to pick up monthly Rx for MS Contin on Friday, Rx fill date is on 10/24/12 which is a Sunday. Thanks

## 2012-10-19 NOTE — Telephone Encounter (Signed)
We can go ahead and give her a RX but put in the comments to fill on or after 10/24/2012

## 2012-10-19 NOTE — Telephone Encounter (Signed)
RX printed and pt notified to pick up on Friday//[facility]

## 2012-10-22 ENCOUNTER — Telehealth: Payer: Self-pay

## 2012-10-22 MED ORDER — MORPHINE SULFATE ER 100 MG PO TBCR: 100.0000 mg | EXTENDED_RELEASE_TABLET | Freq: Two times a day (BID) | ORAL | Status: DC

## 2012-10-22 NOTE — Telephone Encounter (Signed)
Reprinted msContin rx to be picked up as front staff unable to locate original printed rx.

## 2012-11-01 ENCOUNTER — Ambulatory Visit (INDEPENDENT_AMBULATORY_CARE_PROVIDER_SITE_OTHER)
Admission: RE | Admit: 2012-11-01 | Discharge: 2012-11-01 | Disposition: A | Discharge status: Home / Self Care | Payer: Medicare Other | Source: Ambulatory Visit | Attending: Internal Medicine | Admitting: Internal Medicine

## 2012-11-01 ENCOUNTER — Encounter: Payer: Self-pay | Admitting: Internal Medicine

## 2012-11-01 ENCOUNTER — Ambulatory Visit (INDEPENDENT_AMBULATORY_CARE_PROVIDER_SITE_OTHER): Payer: Medicare Other | Admitting: Internal Medicine

## 2012-11-01 VITALS — BP 140/90 | HR 92 | Temp 98.5°F

## 2012-11-01 DIAGNOSIS — R05 Cough: Secondary | ICD-10-CM

## 2012-11-01 DIAGNOSIS — Q059 Spina bifida, unspecified: Secondary | ICD-10-CM

## 2012-11-01 MED ORDER — NORETHINDRONE 0.35 MG PO TABS: 1.0000 | ORAL_TABLET | Freq: Every day | ORAL | Status: DC

## 2012-11-01 MED ORDER — HYDROCODONE-HOMATROPINE 5-1.5 MG/5ML PO SYRP: 5.0000 mL | ORAL_SOLUTION | Freq: Four times a day (QID) | ORAL | Status: DC | PRN

## 2012-11-01 NOTE — Patient Instructions (Signed)
It was good to see you today. Test(s) ordered today. Your results will be released to MyChart (or called to you) after review, usually within 72hours after test completion. If any changes need to be made, you will be notified at that same time. prescription cough syrup to use every 6 hours as needed, especially at night - Your prescription(s) have been submitted to your pharmacy. Please take as directed and contact our office if you believe you are having problem(s) with the medication(s).  Refill medications as requested Will complete medical supply form for your catheters as requested followup with gynecologist Dr. Juliene Pina as discussed Keep followup here as planned

## 2012-11-01 NOTE — Assessment & Plan Note (Signed)
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
## 2012-11-02 ENCOUNTER — Other Ambulatory Visit: Payer: Self-pay | Admitting: Internal Medicine

## 2012-11-07 ENCOUNTER — Ambulatory Visit (INDEPENDENT_AMBULATORY_CARE_PROVIDER_SITE_OTHER): Payer: Medicare Other | Admitting: Family Medicine

## 2012-11-07 VITALS — BP 118/72 | HR 106 | Temp 98.5°F | Resp 16 | Ht <= 58 in | Wt 114.4 lb

## 2012-11-07 DIAGNOSIS — L404 Guttate psoriasis: Secondary | ICD-10-CM

## 2012-11-07 DIAGNOSIS — L408 Other psoriasis: Secondary | ICD-10-CM

## 2012-11-07 DIAGNOSIS — R21 Rash and other nonspecific skin eruption: Secondary | ICD-10-CM

## 2012-11-07 DIAGNOSIS — R05 Cough: Secondary | ICD-10-CM

## 2012-11-07 DIAGNOSIS — R059 Cough, unspecified: Secondary | ICD-10-CM

## 2012-11-07 LAB — POCT CBC
Granulocyte percent: 74.6 %G (ref 37–80)
HCT, POC: 43.4 % (ref 37.7–47.9)
Hemoglobin: 13.9 g/dL (ref 12.2–16.2)
Lymph, poc: 1.6 (ref 0.6–3.4)
MPV: 7.8 fL (ref 0–99.8)
POC Granulocyte: 5.7 (ref 2–6.9)
POC MID %: 4.8 %M (ref 0–12)
RBC: 4.76 M/uL (ref 4.04–5.48)

## 2012-11-07 MED ORDER — HYDROXYZINE PAMOATE 25 MG PO CAPS: 25.0000 mg | ORAL_CAPSULE | Freq: Three times a day (TID) | ORAL | Status: DC | PRN

## 2012-11-07 MED ORDER — TRIAMCINOLONE ACETONIDE 0.1 % EX CREA: TOPICAL_CREAM | Freq: Two times a day (BID) | CUTANEOUS | Status: DC

## 2012-11-07 NOTE — Progress Notes (Signed)
Subjective:    Patient ID: Rhonda Ramos, female    DOB: 18-Dec-1965, 47 y.o.   MRN: [id number]  HPI  Rhonda Ramos is a 47 y.o. female Usual pt of Dr. Felicity Coyer.  Complains today of rash on both legs - few bumps initially on R ankle.few weeks ago, and had a rash on ankle - ankle rash comes and goes past few years - heat rash with braces on lower legs.  Improves with brace off. However new rash on front of thighs, knees and L leg 2 days ago.  Small reddened patch on front of knee past 2 weeks. More there this morning.  Itches, burns.  Tx: otc stop itch spray. Unknown ingredients.   No fever. No new headache. No recent tick exposure/bites. No known sick contacts.  Feels well otherwise except for rash and cough that was treated by Dr. Felicity Coyer recently on 11/01/12.  Had CXR there - told it was ok, no abx's.  Rx hydromet and taking mucinex- more cough in am than afternoon. Stable, no worsening, just not resolved.  CXR report 11/01/12: Findings: Scoliosis and chest wall deformities are stable. Heart size and mediastinal contours are stable. There is chronic scarring at both lung bases. No superimposed airspace disease, pleural effusion or recurrent pneumothorax is identified. IMPRESSION: Stable chronic lung disease. No acute cardiopulmonary process.    Had thrush - 2 weeks ago - had antifungal medicine for 3-4 days.  No other new medicines.     Past Medical History  Diagnosis Date  . Chronic pain syndrome     s/p narc detox fall 2011  . SPINA BIFIDA   . Scoliosis     multiple surg for same  . DEPRESSION     > ptsd (spouse suicide/gsw 11/2005  . GERD   . HYPERTENSION   . Anxiety   . Pneumothorax on right 04/03/11, 06/10/11    recurrent s/p VATS 2/13  . Seizures     onset fall 2011 with narc detox  . Chronic back pain greater than 3 months duration   . 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 Urinary tract infection 08/08/2011    E. coli  .  Spontaneous tension pneumothorax 09/17/2011    Left spontaneous pneumothorax - first occurence   Past Surgical History  Procedure Laterality Date  . Lumbar laminectomy for tethered cord release      x's 4 (84,94,98, and 02)  . 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 Scoliosis  1982    neck fused w/bones  . Chest tube placement  04/03/11    S/P large right sided pneumo  . Back surgery    . Video assisted thoracoscopy  06/11/2011    Procedure: VIDEO ASSISTED THORACOSCOPY;  Surgeon: Delight Ovens, MD;  Location: [facility] OR;  Service: Thoracic;  Laterality: Right;  Stapling of Large Apical and Inferiors blebs; Mechanical Pleurodesis   . Resection of apical bleb  06/11/2011    Procedure: RESECTION OF APICAL BLEB;  Surgeon: Delight Ovens, MD;  Location: [facility] OR;  Service: Thoracic;  Laterality: Right;  and inferior bleb   Allergies  Allergen Reactions  . Latex Shortness Of Breath  . Codeine Nausea And Vomiting  . Effexor (Venlafaxine Hcl)     Hallucination  . Erythromycin Nausea And Vomiting  . Sulfa Antibiotics    Prior to Admission medications   Medication Sig Start Date End Date Taking? Authorizing Provider  ALPRAZolam (  XANAX) 0.5 MG tablet Take 1 tablet (0.5 mg total) by mouth 2 (two) times daily as needed for sleep or anxiety. 07/23/12  Yes Newt Lukes, MD  amLODipine (NORVASC) 5 MG tablet take 1 tablet by mouth once daily 02/05/12  Yes Newt Lukes, MD  FLUoxetine (PROZAC) 20 MG capsule take 3 capsules by mouth once daily 08/14/12  Yes Newt Lukes, MD  HYDROcodone-homatropine (HYDROMET) 5-1.5 MG/5ML syrup Take 5 mLs by mouth every 6 (six) hours as needed for cough. 11/01/12  Yes Newt Lukes, MD  ketoconazole (NIZORAL) 2 % cream Use as directed 01/07/12  Yes Historical Provider, MD  levETIRAcetam (KEPPRA) 250 MG tablet Take 250 mg by mouth daily.   Yes Historical Provider, MD  morphine (MS CONTIN) 100 MG 12 hr tablet Take 1 tablet (100 mg total) by mouth 2 (two)  times daily. Ok to fill  On 10/24/12. Do not fill more than once every 30 days 10/22/12  Yes Nicki Reaper, NP  norethindrone (MICRONOR,CAMILA,ERRIN) 0.35 MG tablet Take 1 tablet (0.35 mg total) by mouth daily. 11/01/12  Yes Newt Lukes, MD  omeprazole (PRILOSEC) 20 MG capsule take 1 capsule by mouth once daily 04/18/12  Yes Nicki Reaper, NP  oxybutynin (DITROPAN-XL) 10 MG 24 hr tablet Take 1 tablet (10 mg total) by mouth daily. 07/23/12  Yes Newt Lukes, MD  PROAIR HFA 108 [phone] BASE) MCG/ACT inhaler inhale 2 puffs by mouth every 6 hours if needed for wheezing 11/02/12  Yes Newt Lukes, MD  triamcinolone cream (KENALOG) 0.1 % Apply topically 2 (two) times daily. 02/11/12  Yes Newt Lukes, MD  cyclobenzaprine (FLEXERIL) 10 MG tablet Take 1 tablet (10 mg total) by mouth 3 (three) times daily as needed for muscle spasms. 11/10/11   Newt Lukes, MD  furosemide (LASIX) 40 MG tablet Take 1 tablet (40 mg total) by mouth daily as needed. 11/14/11   Newt Lukes, MD  Multiple Vitamin (MULITIVITAMIN WITH MINERALS) TABS Take 1 tablet by mouth daily.    Historical Provider, MD  sodium chloride (OCEAN) 0.65 % nasal spray Place 1 spray into the nose at bedtime as needed. For dry nose    Historical Provider, MD   History   Social History  . Marital Status: Widowed    Spouse Name: N/A    Number of Children: N/A  . Years of Education: N/A   Occupational History  . Not on file.   Social History Main Topics  . Smoking status: Former Smoker -- 3.00 packs/day for 27 years    Types: Cigarettes  . Smokeless tobacco: Former Neurosurgeon    Quit date: 02/07/2010     Comment: Widowed, lives alone  . 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 Drug Use: Yes    Special: Other-see comments  . Sexually Active: No   Other Topics Concern  . Not on file   Social History Narrative  . No narrative on file      Review of Systems    Constitutional: Negative for fever and chills.  HENT: Negative for sore throat, mouth sores and trouble swallowing.   Respiratory: Negative for cough.   Musculoskeletal: Positive for arthralgias (R knee - chronic. ).  Skin: Positive for rash. Negative for wound.       Objective:   Physical Exam  Vitals reviewed. Constitutional: She is oriented to person, place, and time. She appears  well-developed and well-nourished. No distress.  HENT:  Head: Normocephalic and atraumatic.  Mouth/Throat: Uvula is midline and mucous membranes are normal. No oropharyngeal exudate, posterior oropharyngeal edema, posterior oropharyngeal erythema or tonsillar abscesses.  Cardiovascular: Normal rate, regular rhythm and normal heart sounds.   Pulmonary/Chest: Effort normal and breath sounds normal. No respiratory distress. She has no wheezes.  Neurological: She is alert and oriented to person, place, and time.  Skin: Skin is warm and dry. Rash noted. She is not diaphoretic.     Psychiatric: Her mood appears anxious.   Results for orders placed in visit on 11/07/12  POCT CBC      Result Value Range   WBC 7.6  4.6 - 10.2 K/uL   Lymph, poc 1.6  0.6 - 3.4   POC LYMPH PERCENT 20.6  10 - 50 %L   MID (cbc) 0.4  0 - 0.9   POC MID % 4.8  0 - 12 %M   POC Granulocyte 5.7  2 - 6.9   Granulocyte percent 74.6  37 - 80 %G   RBC 4.76  4.04 - 5.48 M/uL   Hemoglobin 13.9  12.2 - 16.2 g/dL   HCT, POC 96.0  45.4 - 47.9 %   MCV 91.1  80 - 97 fL   MCH, POC 29.2  27 - 31.2 pg   MCHC 32.0  31.8 - 35.4 g/dL   RDW, POC 09.8     Platelet Count, POC 328  142 - 424 K/uL   MPV 7.8  0 - 99.8 fL  POCT RAPID STREP A (OFFICE)      Result Value Range   Rapid Strep A Screen Negative  Negative        Assessment & Plan:  RENLEY GUTMAN is a 47 y.o. female Rash - Plan: POCT CBC, POCT SEDIMENTATION RATE, Antistreptolysin O titer  Cough - Plan: POCT CBC, POCT SEDIMENTATION RATE, Antistreptolysin O titer  Guttate psoriasis -  Plan: POCT CBC, POCT SEDIMENTATION RATE, Antistreptolysin O titer  Rash on legs with underlying extensor surface involvement suspicious for guttate psoriasis.  Less likely granuloma annulare.  Afebrile, no mucosal lesions.  Recent cough, but no sore throat.  Will check ASO titer to r/o strep as cause. Topical triamcinolone 0.1%BID prn to affected areas for itching. Vistaril 25mg  Q8h prn  - SED and cautioned on combined sedation with other chronic meds. Recheck with PCP or here in next 48-72 hours.  Sooner if worse. May need derm eval.   At end of visit - concern expressed over thickened nails for years - plans to follow up with PCP to discuss treatment options.   Meds ordered this encounter  Medications  . triamcinolone cream (KENALOG) 0.1 %    Sig: Apply topically 2 (two) times daily.    Dispense:  30 g    Refill:  0  . hydrOXYzine (VISTARIL) 25 MG capsule    Sig: Take 1 capsule (25 mg total) by mouth 3 (three) times daily as needed for itching.    Dispense:  30 capsule    Refill:  0

## 2012-11-07 NOTE — Patient Instructions (Signed)
Your rash appears to be guttate psoriasis, but will need to watch this over the next few days.  Apply the steroid cream to the affected areas twice per day as needed, and you can take the vistaril for itching up to every 8 hours as needed (this can cause sedation).  Recheck here or with Dr. Felicity Coyer in the next 2-3 days (sooner or to the emergency room if worsening rash, fever, or mouth or genital sores).   Psoriasis Psoriasis is a common, long-lasting (chronic) inflammation of the skin. It affects both men and women equally, of all ages and all races. Psoriasis cannot be passed from person to person (not contagious). Psoriasis varies from mild to very severe. When severe, it can greatly affect your quality of life. Psoriasis is an inflammatory disorder affecting the skin as well as other organs including the joints (causing an arthritis). With psoriasis, the skin sheds its top layer of cells more rapidly than it does in someone without psoriasis. CAUSES  The cause of psoriasis is largely unknown. Genetics, your immune system, and the environment seem to play a role in causing psoriasis. Factors that can make psoriasis worse include:  Damage or trauma to the skin, such as cuts, scrapes, and sunburn. This damage often causes new areas of psoriasis (lesions).  Winter dryness and lack of sunlight.  Medicines such as lithium, beta-blockers, antimalarial drugs, ACE inhibitors, nonsteroidal anti-inflammatory drugs (ibuprofen, aspirin), and terbinafine. Let your caregiver know if you are taking any of these drugs.  Alcohol. Excessive alcohol use should be avoided if you have psoriasis. Drinking large amounts of alcohol can affect:  How well your psoriasis treatment works.  How safe your psoriasis treatment is.  Smoking. If you smoke, ask your caregiver for help to quit.  Stress.  Bacterial or viral infections.  Arthritis. Arthritis associated with psoriasis (psoriatic arthritis) affects less than  10% of patients with psoriasis. The arthritic intensity does not always match the skin psoriasis intensity. It is important to let your caregiver know if your joints hurt or if they are stiff. SYMPTOMS  The most common form of psoriasis begins with little red bumps that gradually become larger. The bumps begin to form scales that flake off easily. The lower layers of scales stick together. When these scales are scratched or removed, the underlying skin is tender and bleeds easily. These areas then grow in size and may become large. Psoriasis often creates a rash that looks the same on both sides of the body (symmetrical). It often affects the elbows, knees, groin, genitals, arms, legs, scalp, and nails. Affected nails often have pitting, loosen, thicken, crumble, and are difficult to treat.  "Inverse psoriasis"occurs in the armpits, under breasts, in skin folds, and around the groin, buttocks, and genitals.  "Guttate psoriasis" generally occurs in children and young adults following a recent sore throat (strep throat). It begins with many small, red, scaly spots on the skin. It clears spontaneously in weeks or a few months without treatment. DIAGNOSIS  Psoriasis is diagnosed by physical exam. A tissue sample (biopsy) may also be taken. TREATMENT The treatment of psoriasis depends on your age, health, and living conditions.  Steroid (cortisone) creams, lotions, and ointments may be used. These treatments are associated with thinning of the skin, blood vessels that get larger (dilated), loss of skin pigmentation, and easy bruising. It is important to use these steroids as directed by your caregiver. Only treat the affected areas and not the normal, unaffected skin. People on long-term  steroid treatment should wear a medical alert bracelet. Injections may be used in areas that are difficult to treat.  Scalp treatments are available as shampoos, solutions, sprays, foams, and oils. Avoid scratching the  scalp and picking at the scales.  Anthralin medicine works well on areas that are difficult to treat. However, it stains clothes and skin and may cause temporary irritation.  Synthetic vitamin D (calcipotriene)can be used on small areas. It is available by prescription. The forms of synthetic vitamin D available in health food stores do not help with psoriasis.  Coal tarsare available in various strengths for psoriasis that is difficult to treat. They are one of the longest used treatments for difficult to treat psoriasis. However, they are messy to use.  Light therapy (UV therapy) can be carefully and professionally monitored in a dermatologist's office. Careful sunbathing is helpful for many people as directed by your caregiver. The exposure should be just long enough to cause a mild redness (erythema) of your skin. Avoid sunburn as this may make the condition worse. Sunscreen (SPF of 30 or higher) should be used to protect against sunburn. Cataracts, wrinkles, and skin aging are some of the harmful side effects of light therapy.  If creams (topical medicines) fail, there are several other options for systemic or oral medicines your caregiver can suggest. Psoriasis can sometimes be very difficult to treat. It can come and go. It is necessary to follow up with your caregiver regularly if your psoriasis is difficult to treat. Usually, with persistence you can get a good amount of relief. Maintaining consistent care is important. Do not change caregivers just because you do not see immediate results. It may take several trials to find the right combination of treatment for you. PREVENTING FLARE-UPS  Wear gloves while you wash dishes, while cleaning, and when you are outside in the cold.  If you have radiators, place a bowl of water or damp towel on the radiator. This will help put water back in the air. You can also use a humidifier to keep the air moist. Try to keep the humidity at about 60% in  your home.  Apply moisturizer while your skin is still damp from bathing or showering. This traps water in the skin.  Avoid long, hot baths or showers. Keep soap use to a minimum. Soaps dry out the skin and wash away the protective oils. Use a fragrance free, dye free soap.  Drink enough water and fluids to keep your urine clear or pale yellow. Not drinking enough water depletes your skin's water supply.  Turn off the heat at night and keep it low during the day. Cool air is less drying. SEEK MEDICAL CARE IF:  You have increasing pain in the affected areas.  You have uncontrolled bleeding in the affected areas.  You have increasing redness or warmth in the affected areas.  You start to have pain or stiffness in your joints.  You start feeling depressed about your condition.  You have a fever. Document Released: 04/18/2000 Document Revised: 07/14/2011 Document Reviewed: 10/14/2010 [facility] Patient Information 2014 [place], [place].

## 2012-11-10 ENCOUNTER — Encounter: Payer: Self-pay | Admitting: Internal Medicine

## 2012-11-10 ENCOUNTER — Ambulatory Visit (INDEPENDENT_AMBULATORY_CARE_PROVIDER_SITE_OTHER): Payer: Medicare Other | Admitting: Internal Medicine

## 2012-11-10 VITALS — BP 120/72 | HR 101 | Temp 98.5°F | Wt 116.1 lb

## 2012-11-10 DIAGNOSIS — R062 Wheezing: Secondary | ICD-10-CM

## 2012-11-10 DIAGNOSIS — R21 Rash and other nonspecific skin eruption: Secondary | ICD-10-CM

## 2012-11-10 DIAGNOSIS — J9801 Acute bronchospasm: Secondary | ICD-10-CM

## 2012-11-10 MED ORDER — METHYLPREDNISOLONE ACETATE 80 MG/ML IJ SUSP
80.0000 mg | Freq: Once | INTRAMUSCULAR | Status: AC
  Administered 2012-11-10: 80 mg via INTRAMUSCULAR

## 2012-11-10 MED ORDER — DOXYCYCLINE HYCLATE 100 MG PO TABS: 100.0000 mg | ORAL_TABLET | Freq: Two times a day (BID) | ORAL | Status: DC

## 2012-11-10 MED ORDER — HYDROCODONE-HOMATROPINE 5-1.5 MG/5ML PO SYRP: 5.0000 mL | ORAL_SOLUTION | Freq: Three times a day (TID) | ORAL | Status: DC | PRN

## 2012-11-10 MED ORDER — TRIAMCINOLONE ACETONIDE 0.1 % EX CREA: TOPICAL_CREAM | Freq: Two times a day (BID) | CUTANEOUS | Status: DC

## 2012-11-10 MED ORDER — PREDNISONE (PAK) 10 MG PO TABS: 10.0000 mg | ORAL_TABLET | ORAL | Status: DC

## 2012-11-10 NOTE — Patient Instructions (Addendum)
It was good to see you today. We have reviewed your prior records including labs and tests today In the office today, he received albuterol treatment and Medrol shot for coughing and spasm Treat bronchospasm and infection with doxycycline antibiotics twice a day for the next week, prednisone taper over next 6 days. Okay to continue cough syrup as needed, refill provided -also refill for cream to treat rash Your prescription(s) have been submitted to your pharmacy. Please take as directed and contact our office if you believe you are having problem(s) with the medication(s).

## 2012-11-10 NOTE — Progress Notes (Signed)
Subjective:    Patient ID: Rhonda Ramos, female    DOB: 03/07/1966, 47 y.o.   MRN: [id number]  Wheezing  The current episode started 1 to 4 weeks ago. The problem occurs constantly. The problem has been gradually worsening. Associated symptoms include coughing, a rash, shortness of breath and sputum production. Pertinent negatives include no chest pain, chills, diarrhea, fever, headaches, hemoptysis, neck pain, swollen glands or vomiting. The symptoms are aggravated by any activity. She has tried beta agonist inhalers and prescription cough suppressant for the symptoms. The treatment provided mild relief. Her past medical history is significant for chronic lung disease. There is no history of asthma, CAD, COPD or PE.  Cough Associated symptoms include a rash, shortness of breath and wheezing. Pertinent negatives include no chest pain, chills, fever, headaches or hemoptysis. There is no history of asthma or COPD.    Past Medical History  Diagnosis Date  . Chronic pain syndrome     s/p narc detox fall 2011  . SPINA BIFIDA   . Scoliosis     multiple surg for same  . DEPRESSION     > ptsd (spouse suicide/gsw 11/2005  . GERD   . HYPERTENSION   . Anxiety   . Pneumothorax on right 04/03/11, 06/10/11    recurrent s/p VATS 2/13  . Seizures     onset fall 2011 with narc detox  . Chronic back pain greater than 3 months duration   . 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 Urinary tract infection 08/08/2011    E. coli  . Spontaneous tension pneumothorax 09/17/2011    Left spontaneous pneumothorax - first occurence    Review of Systems  Constitutional: Positive for fatigue. Negative for fever, chills and unexpected weight change.  HENT: Negative for neck pain.   Respiratory: Positive for cough, sputum production, shortness of breath and wheezing. Negative for hemoptysis.   Cardiovascular: Negative for chest pain.  Gastrointestinal: Negative for  vomiting and diarrhea.  Musculoskeletal: Positive for back pain (chronic) and gait problem (chronic).  Skin: Positive for rash.  Neurological: Negative for headaches.       Objective:   Physical Exam  BP 120/72  Pulse 101  Temp(Src) 98.5 F (36.9 C) (Oral)  Wt 116 lb 1.9 oz (52.672 kg)  BMI 24.28 kg/m2  SpO2 95% Wt Readings from Last 3 Encounters:  11/10/12 116 lb 1.9 oz (52.672 kg)  11/07/12 114 lb 6.4 oz (51.891 kg)  09/20/12 112 lb 6.4 oz (50.984 kg)   General:  small statured woman, older appearing than stated age. Dry deep cough spasms. Boyfriend at side Lungs:  shallow respiratory effort, no intercostal retractions or use of accessory muscles; scattered rhonchi breath sounds bilaterally - no crackles and no wheezes.  Heart:  Slightly tachy rate, but regular rhythm, no murmur, and no rub. BLE without edema. Neurologic:  occ small myoclonic jerk. alert & oriented X3 and cranial nerves II-XII symetrically intact.  strength grossly normal in upper extremities (see MSkel above), sensation intact to light touch. speech fluent without dysarthria or aphasia; follows commands with good comprehension.  Skin: resolving petechial rash on B anterior thighs Psych:  Normal mood and affect -Oriented X3, memory intact for recent and remote, normally interactive, and good eye contact  Lab Results  Component Value Date   WBC 7.6 11/07/2012   HGB 13.9 11/07/2012   HCT 43.4 11/07/2012   PLT 292.0 09/20/2012   GLUCOSE 90 09/20/2012   CHOL  195 07/21/2011   TRIG 223.0* 07/21/2011   HDL 49.80 07/21/2011   LDLDIRECT 112.3 07/21/2011   ALT 16 09/20/2012   AST 21 09/20/2012   NA 139 09/20/2012   K 3.4* 09/20/2012   CL 105 09/20/2012   CREATININE 1.0 09/20/2012   BUN 9 09/20/2012   CO2 20 09/20/2012   TSH 1.03 09/20/2012   INR 0.95 09/16/2011   HGBA1C 5.4 05/08/2008   Dg Chest 2 View  11/01/2012   *RADIOLOGY REPORT*  Clinical Data: Cough.  History of COPD, hypertension and pneumothorax.  CHEST - 2 VIEW   Comparison: 10/06/2011 and 09/21/2011.  Findings: Scoliosis and chest wall deformities are stable.  Heart size and mediastinal contours are stable.  There is chronic scarring at both lung bases.  No superimposed airspace disease, pleural effusion or recurrent pneumothorax is identified.  IMPRESSION: Stable chronic lung disease.  No acute cardiopulmonary process.   Original Report Authenticated By: Carey Bullocks, M.D.     Assessment & Plan:   Cough with acute bronchospasm - ongoing > 1 week Hx recurrent spont ptx x 2 on R (s/p VATS) and tension ptx L (all in 2013)  Alb neb in office today - improved symptoms  Reviewed neg CXR 11/01/12 no fever but reports green sputum so will tx bronchospasm with empiric antibiotics and steroids for inflammation (IM + po taper) - also prn Alb MDI Ok to cough suppression with narcotics - refill provided   Rash -?viral - improving since UC visit for same (reviewed) - ok to continue topical steroid prn - refill today

## 2012-11-11 ENCOUNTER — Ambulatory Visit: Payer: Medicare Other | Admitting: Internal Medicine

## 2012-11-11 DIAGNOSIS — Z0289 Encounter for other administrative examinations: Secondary | ICD-10-CM

## 2012-11-12 ENCOUNTER — Telehealth: Payer: Self-pay | Admitting: *Deleted

## 2012-11-12 MED ORDER — TRIAMCINOLONE ACETONIDE 0.1 % EX CREA: TOPICAL_CREAM | Freq: Two times a day (BID) | CUTANEOUS | Status: DC

## 2012-11-12 NOTE — Telephone Encounter (Signed)
Spoke with pt advised Rx has been sent to pharmacy. 

## 2012-11-12 NOTE — Telephone Encounter (Signed)
Pt called requesting a refill on Triamcinolone Cream.  Please advise

## 2012-11-12 NOTE — Telephone Encounter (Signed)
Ok erx done

## 2012-11-17 ENCOUNTER — Telehealth: Payer: Self-pay | Admitting: *Deleted

## 2012-11-17 MED ORDER — MORPHINE SULFATE ER 100 MG PO TBCR: 100.0000 mg | EXTENDED_RELEASE_TABLET | Freq: Two times a day (BID) | ORAL | Status: DC

## 2012-11-17 NOTE — Telephone Encounter (Signed)
Spoke with pt - rx ready for pick up 

## 2012-11-17 NOTE — Telephone Encounter (Signed)
Pt called requesting refill on MS Contin.  Please advise 

## 2012-11-17 NOTE — Telephone Encounter (Signed)
ok 

## 2012-11-24 ENCOUNTER — Telehealth: Payer: Self-pay | Admitting: *Deleted

## 2012-11-24 MED ORDER — BENZONATATE 200 MG PO CAPS: 200.0000 mg | ORAL_CAPSULE | Freq: Three times a day (TID) | ORAL | Status: DC

## 2012-11-24 NOTE — Telephone Encounter (Signed)
Spoke to pt, advised Rx sent to pharmacy.

## 2012-11-24 NOTE — Telephone Encounter (Signed)
Pt called states she is still experiencing a persistent cough even after completion of antibiotics.  Please advise

## 2012-11-24 NOTE — Telephone Encounter (Signed)
Tessalon TID - erx done

## 2012-11-26 ENCOUNTER — Encounter: Payer: Self-pay | Admitting: Internal Medicine

## 2012-11-26 ENCOUNTER — Ambulatory Visit (INDEPENDENT_AMBULATORY_CARE_PROVIDER_SITE_OTHER): Payer: Medicare Other | Admitting: Internal Medicine

## 2012-11-26 VITALS — BP 132/90 | HR 93 | Temp 98.9°F | Wt 114.0 lb

## 2012-11-26 DIAGNOSIS — J9383 Other pneumothorax: Secondary | ICD-10-CM

## 2012-11-26 DIAGNOSIS — J209 Acute bronchitis, unspecified: Secondary | ICD-10-CM

## 2012-11-26 DIAGNOSIS — J440 Chronic obstructive pulmonary disease with acute lower respiratory infection: Secondary | ICD-10-CM

## 2012-11-26 DIAGNOSIS — J939 Pneumothorax, unspecified: Secondary | ICD-10-CM

## 2012-11-26 MED ORDER — TIOTROPIUM BROMIDE MONOHYDRATE 18 MCG IN CAPS: 18.0000 ug | ORAL_CAPSULE | Freq: Every day | RESPIRATORY_TRACT | Status: DC

## 2012-11-26 MED ORDER — LEVOFLOXACIN 500 MG PO TABS: 500.0000 mg | ORAL_TABLET | Freq: Every day | ORAL | Status: DC

## 2012-11-26 MED ORDER — PREDNISONE (PAK) 10 MG PO TABS: 10.0000 mg | ORAL_TABLET | ORAL | Status: DC

## 2012-11-26 MED ORDER — HYDROCODONE-HOMATROPINE 5-1.5 MG/5ML PO SYRP: 5.0000 mL | ORAL_SOLUTION | Freq: Three times a day (TID) | ORAL | Status: DC | PRN

## 2012-11-26 NOTE — Progress Notes (Signed)
Subjective:    Patient ID: Rhonda Ramos, female    DOB: February 11, 1966, 47 y.o.   MRN: [id number]  Cough This is a recurrent problem. The current episode started 1 to 4 weeks ago. The problem occurs every few minutes. The cough is productive of sputum. Associated symptoms include shortness of breath and wheezing. Pertinent negatives include no fever, heartburn, nasal congestion, postnasal drip or sore throat. The symptoms are aggravated by lying down and exercise. Risk factors for lung disease include smoking/tobacco exposure. She has tried a beta-agonist inhaler and prescription cough suppressant for the symptoms. The treatment provided mild relief. Her past medical history is significant for COPD and environmental allergies. ptx, recurrent    Past Medical History  Diagnosis Date  . Chronic pain syndrome     s/p narc detox fall 2011  . SPINA BIFIDA   . Scoliosis     multiple surg for same  . DEPRESSION     > ptsd (spouse suicide/gsw 11/2005  . GERD   . HYPERTENSION   . Anxiety   . Pneumothorax on right 04/03/11, 06/10/11    recurrent s/p VATS 2/13  . Seizures     onset fall 2011 with narc detox  . Chronic back pain greater than 3 months duration   . 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 Urinary tract infection 08/08/2011    E. coli  . Spontaneous tension pneumothorax 09/17/2011    Left spontaneous pneumothorax - first occurence    Review of Systems  Constitutional: Positive for fatigue. Negative for fever and unexpected weight change.  HENT: Negative for sore throat and postnasal drip.   Respiratory: Positive for cough, shortness of breath and wheezing.   Gastrointestinal: Negative for heartburn.  Musculoskeletal: Positive for back pain (chronic) and gait problem (chronic).  Allergic/Immunologic: Positive for environmental allergies.       Objective:   Physical Exam  BP 132/90  Pulse 93  Temp(Src) 98.9 F (37.2 C) (Oral)  Wt  114 lb (51.71 kg)  BMI 23.83 kg/m2  SpO2 93% Wt Readings from Last 3 Encounters:  11/26/12 114 lb (51.71 kg)  11/10/12 116 lb 1.9 oz (52.672 kg)  11/07/12 114 lb 6.4 oz (51.891 kg)   General:  small statured woman, older appearing than stated age. Dry deep cough spasms. Boyfriend at side Lungs:  shallow respiratory effort, no intercostal retractions or use of accessory muscles; scattered rhonchi breath sounds bilaterally - no crackles and no wheezes.  Heart:  Slightly tachy rate, but regular rhythm, no murmur, and no rub. BLE without edema. Neurologic:  occ small myoclonic jerk. alert & oriented X3 and cranial nerves II-XII symetrically intact.  strength grossly normal in upper extremities (see MSkel above), sensation intact to light touch. speech fluent without dysarthria or aphasia; follows commands with good comprehension.  Psych:  Normal mood and affect -Oriented X3, memory intact for recent and remote, normally interactive, and good eye contact  Lab Results  Component Value Date   WBC 7.6 11/07/2012   HGB 13.9 11/07/2012   HCT 43.4 11/07/2012   PLT 292.0 09/20/2012   GLUCOSE 90 09/20/2012   CHOL 195 07/21/2011   TRIG 223.0* 07/21/2011   HDL 49.80 07/21/2011   LDLDIRECT 112.3 07/21/2011   ALT 16 09/20/2012   AST 21 09/20/2012   NA 139 09/20/2012   K 3.4* 09/20/2012   CL 105 09/20/2012   CREATININE 1.0 09/20/2012   BUN 9 09/20/2012   CO2 20 09/20/2012  TSH 1.03 09/20/2012   INR 0.95 09/16/2011   HGBA1C 5.4 05/08/2008   Dg Chest 2 View  11/01/2012   *RADIOLOGY REPORT*  Clinical Data: Cough.  History of COPD, hypertension and pneumothorax.  CHEST - 2 VIEW  Comparison: 10/06/2011 and 09/21/2011.  Findings: Scoliosis and chest wall deformities are stable.  Heart size and mediastinal contours are stable.  There is chronic scarring at both lung bases.  No superimposed airspace disease, pleural effusion or recurrent pneumothorax is identified.  IMPRESSION: Stable chronic lung disease.  No acute  cardiopulmonary process.   Original Report Authenticated By: Carey Bullocks, M.D.      Assessment & Plan:   Continued cough - ongoing > 3 weeks Current symptoms consistent with chronic bronchitis/COPD exacerbation Tobacco abuse history Hx recurrent spont ptx x 2 on R (s/p VATS) and tension ptx L (all in 2013)   Reviewed neg CXR 11/01/12 no fever but reports green sputum so will tx with empiric antibiotics and steroids for inflammation (po taper) -  Refer for PFTs - ?COPD given hx tobacco and recent cessation Start Spiriva and conitnue prn Alb MDI Ok to refill cough suppression with narcotics once ore Pt understands she will need evaluation by pulmonary if continued or worsening symptoms

## 2012-11-26 NOTE — Patient Instructions (Signed)
It was good to see you today. We have reviewed your prior records including labs and tests today Treat cough with 1) Levaquin antibiotics daily x 7 days, 2) prednisone taper over next 6 days, 3) tessalon perles 3x/day x 1 week and 4) cough syrup at bedtime as needed, Also start Spiriva inhaler once daily for COPD  Your prescription(s)/refills have been submitted to your pharmacy. Please take as directed and contact our office if you believe you are having problem(s) with the medication(s). we'll make referral for pulmonary function tests to evaluate for COPD and cough . Our office will contact you regarding appointment(s) once made. If continued symptoms, we will refer to pulmonary specialist for further evaluation and treatment of her symptoms

## 2012-12-01 ENCOUNTER — Emergency Department (HOSPITAL_COMMUNITY)
Admission: EM | Admit: 2012-12-01 | Discharge: 2012-12-01 | Disposition: A | Discharge status: Home / Self Care | Payer: Medicare Other | Attending: Emergency Medicine | Admitting: Emergency Medicine

## 2012-12-01 ENCOUNTER — Encounter (HOSPITAL_COMMUNITY): Payer: Self-pay

## 2012-12-01 ENCOUNTER — Emergency Department (HOSPITAL_COMMUNITY): Payer: Medicare Other

## 2012-12-01 DIAGNOSIS — Z87891 Personal history of nicotine dependence: Secondary | ICD-10-CM | POA: Insufficient documentation

## 2012-12-01 DIAGNOSIS — Z8669 Personal history of other diseases of the nervous system and sense organs: Secondary | ICD-10-CM | POA: Insufficient documentation

## 2012-12-01 DIAGNOSIS — Z79899 Other long term (current) drug therapy: Secondary | ICD-10-CM | POA: Insufficient documentation

## 2012-12-01 DIAGNOSIS — Z87798 Personal history of other (corrected) congenital malformations: Secondary | ICD-10-CM | POA: Insufficient documentation

## 2012-12-01 DIAGNOSIS — Z87440 Personal history of urinary (tract) infections: Secondary | ICD-10-CM | POA: Insufficient documentation

## 2012-12-01 DIAGNOSIS — I10 Essential (primary) hypertension: Secondary | ICD-10-CM | POA: Insufficient documentation

## 2012-12-01 DIAGNOSIS — G40909 Epilepsy, unspecified, not intractable, without status epilepticus: Secondary | ICD-10-CM | POA: Insufficient documentation

## 2012-12-01 DIAGNOSIS — Z8709 Personal history of other diseases of the respiratory system: Secondary | ICD-10-CM | POA: Insufficient documentation

## 2012-12-01 DIAGNOSIS — F329 Major depressive disorder, single episode, unspecified: Secondary | ICD-10-CM | POA: Insufficient documentation

## 2012-12-01 DIAGNOSIS — F410 Panic disorder [episodic paroxysmal anxiety] without agoraphobia: Secondary | ICD-10-CM | POA: Insufficient documentation

## 2012-12-01 DIAGNOSIS — K219 Gastro-esophageal reflux disease without esophagitis: Secondary | ICD-10-CM | POA: Insufficient documentation

## 2012-12-01 DIAGNOSIS — F3289 Other specified depressive episodes: Secondary | ICD-10-CM | POA: Insufficient documentation

## 2012-12-01 LAB — CBC WITH DIFFERENTIAL/PLATELET
Basophils Relative: 0 % (ref 0–1)
Eosinophils Absolute: 0.1 10*3/uL (ref 0.0–0.7)
Eosinophils Relative: 1 % (ref 0–5)
MCH: 29.3 pg (ref 26.0–34.0)
MCHC: 34.3 g/dL (ref 30.0–36.0)
MCV: 85.5 fL (ref 78.0–100.0)
Neutrophils Relative %: 63 % (ref 43–77)
Platelets: 292 10*3/uL (ref 150–400)
RDW: 15.5 % (ref 11.5–15.5)

## 2012-12-01 LAB — BASIC METABOLIC PANEL
Calcium: 9.7 mg/dL (ref 8.4–10.5)
GFR calc Af Amer: >90 mL/min (ref >90)
GFR calc non Af Amer: 78 mL/min — ABNORMAL LOW (ref >90)
Glucose, Bld: 133 mg/dL — ABNORMAL HIGH (ref 70–99)
Potassium: 3.6 mEq/L (ref 3.5–5.1)
Sodium: 133 mEq/L — ABNORMAL LOW (ref 135–145)

## 2012-12-01 MED ORDER — ALPRAZOLAM 0.5 MG PO TABS: 0.5000 mg | ORAL_TABLET | Freq: Every evening | ORAL | Status: DC | PRN

## 2012-12-01 MED ORDER — LIDOCAINE-EPINEPHRINE 2 %-1:100000 IJ SOLN: 20.0000 mL | Freq: Once | INTRAMUSCULAR | Status: DC

## 2012-12-01 NOTE — ED Notes (Signed)
[id number]<AV> Expected date:12/01/12<BR> Expected time: 1:11 AM<BR> Means of arrival:Ambulance<BR> Comments:<BR> Shortness of breath

## 2012-12-01 NOTE — ED Provider Notes (Signed)
Medical screening examination/treatment/procedure(s) were performed by non-physician practitioner and as supervising physician I was immediately available for consultation/collaboration.  Armella Stogner, MD 12/01/12 0447 

## 2012-12-01 NOTE — ED Provider Notes (Signed)
CSN: [id number]     Arrival date & time 12/01/12  0122 History     First MD Initiated Contact with Patient 12/01/12 0202     Chief Complaint  Patient presents with  . Shortness of Breath   (Consider location/radiation/quality/duration/timing/severity/associated sxs/prior Treatment) HPI  Rhonda Ramos is a 47 y.o. female with past medical history significant for spina bifida, recurrent right-sided pneumothorax (VATS and pleurodesis), COPD, former smoker with 81 pack years complaining of acute onset of right posterior chest pain, now completely resolved. Patient states that she denies shortness of breath at any time. Note this contradicts triage assessment. Patient states that she has a history of multiple recurrent pneumothoraxes when she felt the pain she became agitated and she thinks it set off a panic attack. Patient normally takes Xanax for anxiety, however she has been out for the last several days.  Past Medical History  Diagnosis Date  . Chronic pain syndrome     s/p narc detox fall 2011  . SPINA BIFIDA   . Scoliosis     multiple surg for same  . DEPRESSION     > ptsd (spouse suicide/gsw 11/2005  . GERD   . HYPERTENSION   . Anxiety   . Pneumothorax on right 04/03/11, 06/10/11    recurrent s/p VATS 2/13  . Seizures     onset fall 2011 with narc detox  . Chronic back pain greater than 3 months duration   . 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 Urinary tract infection 08/08/2011    E. coli  . Spontaneous tension pneumothorax 09/17/2011    Left spontaneous pneumothorax - first occurence   Past Surgical History  Procedure Laterality Date  . Lumbar laminectomy for tethered cord release      x's 4 (84,94,98, and 02)  . 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 Scoliosis  1982    neck fused w/bones  . Chest tube placement  04/03/11    S/P large right sided pneumo  . Back surgery    . Video assisted thoracoscopy  06/11/2011     Procedure: VIDEO ASSISTED THORACOSCOPY;  Surgeon: Delight Ovens, MD;  Location: [facility] OR;  Service: Thoracic;  Laterality: Right;  Stapling of Large Apical and Inferiors blebs; Mechanical Pleurodesis   . Resection of apical bleb  06/11/2011    Procedure: RESECTION OF APICAL BLEB;  Surgeon: Delight Ovens, MD;  Location: [facility] OR;  Service: Thoracic;  Laterality: Right;  and inferior bleb   Family History  Problem Relation Age of Onset  . Diabetes Father   . Arthritis Other     parent & grandparent  . Diabetes Other     grandparent   History  Substance Use Topics  . Smoking status: Former Smoker -- 3.00 packs/day for 27 years    Types: Cigarettes  . Smokeless tobacco: Former Neurosurgeon    Quit date: 02/07/2010  . Alcohol Use: No     Comment: h/o pain medicines addiction ; released from rehab 11/14/ 2011   OB History   Grav Para Term Preterm Abortions TAB SAB Ect Mult Living                 Review of Systems 10 systems reviewed and found to be negative, except as noted in the HPI   Allergies  Latex; Codeine; Effexor; Erythromycin; and Sulfa antibiotics  Home Medications   Current Outpatient Rx  Name  Route  Sig  Dispense  Refill  . albuterol (PROVENTIL HFA;VENTOLIN HFA) 108 (90 BASE) MCG/ACT inhaler   Inhalation   Inhale 2 puffs into the lungs every 6 (six) hours as needed for wheezing.         Marland Kitchen ALPRAZolam (XANAX) 0.5 MG tablet   Oral   Take 1 tablet (0.5 mg total) by mouth 2 (two) times daily as needed for sleep or anxiety.   60 tablet   5   . amLODipine (NORVASC) 5 MG tablet   Oral   Take 5 mg by mouth daily.         . benzonatate (TESSALON) 200 MG capsule   Oral   Take 1 capsule (200 mg total) by mouth 3 (three) times daily.   30 capsule   0   . cyclobenzaprine (FLEXERIL) 10 MG tablet   Oral   Take 1 tablet (10 mg total) by mouth 3 (three) times daily as needed for muscle spasms.   60 tablet   3   . FLUoxetine (PROZAC) 20 MG capsule   Oral   Take 60  mg by mouth daily.         . furosemide (LASIX) 40 MG tablet   Oral   Take 1 tablet (40 mg total) by mouth daily as needed.   30 tablet   1   . hydrOXYzine (VISTARIL) 25 MG capsule   Oral   Take 1 capsule (25 mg total) by mouth 3 (three) times daily as needed for itching.   30 capsule   0   . levETIRAcetam (KEPPRA) 250 MG tablet   Oral   Take 250 mg by mouth daily.         Marland Kitchen levofloxacin (LEVAQUIN) 500 MG tablet   Oral   Take 500 mg by mouth daily.         Marland Kitchen morphine (MS CONTIN) 100 MG 12 hr tablet   Oral   Take 1 tablet (100 mg total) by mouth 2 (two) times daily. Do not fill more than once every 30 days   60 tablet   0   . Multiple Vitamin (MULITIVITAMIN WITH MINERALS) TABS   Oral   Take 1 tablet by mouth daily.         . norethindrone (MICRONOR,CAMILA,ERRIN) 0.35 MG tablet   Oral   Take 1 tablet (0.35 mg total) by mouth daily.   1 Package   1   . omeprazole (PRILOSEC) 20 MG capsule   Oral   Take 20 mg by mouth daily.         Marland Kitchen oxybutynin (DITROPAN-XL) 10 MG 24 hr tablet   Oral   Take 1 tablet (10 mg total) by mouth daily.   30 tablet   5   . predniSONE (STERAPRED UNI-PAK) 10 MG tablet   Oral   Take 1 tablet (10 mg total) by mouth as directed. As directed x 6 days   21 tablet   0   . tiotropium (SPIRIVA HANDIHALER) 18 MCG inhalation capsule   Inhalation   Place 1 capsule (18 mcg total) into inhaler and inhale daily.   30 capsule   12   . triamcinolone cream (KENALOG) 0.1 %   Topical   Apply topically 2 (two) times daily.   454 g   0    BP 117/85  Pulse 105  Temp(Src) 99.7 F (37.6 C) (Oral)  Resp 28  SpO2 96% Physical Exam  Nursing note and vitals reviewed. Constitutional: She is oriented to  person, place, and time. She appears well-developed and well-nourished. No distress.  HENT:  Head: Normocephalic.  Mouth/Throat: Oropharynx is clear and moist.  Eyes: Conjunctivae and EOM are normal. Pupils are equal, round, and reactive  to light.  Neck: Normal range of motion.  Cardiovascular:  Mildly tachycardic  Pulmonary/Chest: Effort normal and breath sounds normal. No stridor. No respiratory distress. She has no wheezes. She has no rales. She exhibits no tenderness.  Adequate air movement in all fields, no wheezing. No stridor  Abdominal: Soft.  Musculoskeletal: Normal range of motion. She exhibits no edema.  No calf asymmetry, superficial collaterals, palpable cords, edema, Homans sign negative bilaterally.    Neurological: She is alert and oriented to person, place, and time.  Psychiatric: She has a normal mood and affect.    ED Course   Procedures (including critical care time)  Labs Reviewed  BASIC METABOLIC PANEL - Abnormal; Notable for the following:    Sodium 133 (*)    Glucose, Bld 133 (*)    GFR calc non Af Amer 78 (*)    All other components within normal limits  CBC WITH DIFFERENTIAL   Dg Chest 2 View  12/01/2012   *RADIOLOGY REPORT*  Clinical Data: Short of breath.  Cough.  CHEST - 2 VIEW  Comparison: 11/01/2012.  Findings: No interval change in the chest compared to prior exam 1 month earlier.  Substantial bilateral basilar atelectasis.  The cardiopericardial silhouette and mediastinal contours appears similar. Monitoring leads are projected over the chest.  No airspace disease.  No pleural effusion.  IMPRESSION: Chronic changes of the chest without acute cardiopulmonary disease.   Original Report Authenticated By: Andreas Newport, M.D.    Date: 12/01/2012  Rate: 118  Rhythm: sinus tachycardia  QRS Axis: normal  Intervals: normal  ST/T Wave abnormalities: normal  Conduction Disutrbances:none  Narrative Interpretation:   Old EKG Reviewed: unchanged    1. Panic attack     MDM   Filed Vitals:   12/01/12 0124 12/01/12 0304  BP: 117/85 130/90  Pulse: 105 87  Temp: 99.7 F (37.6 C)   TempSrc: Oral   Resp: 28 18  SpO2: 96% 96%     Rhonda Ramos is a 47 y.o. female with right  posterior thoracic chest pain acute onset possible panic attack. Chest x-ray shows no abnormalities. Patient is not anemic. EKG does show sinus tach. Patient states that she is much improved.  Pt is hemodynamically stable, appropriate for, and amenable to discharge at this time. Pt verbalized understanding and agrees with care plan. All questions answered. Outpatient follow-up and specific return precautions discussed.    New Prescriptions   ALPRAZOLAM (XANAX) 0.5 MG TABLET    Take 1 tablet (0.5 mg total) by mouth at bedtime as needed for sleep.    Note: Portions of this report may have been transcribed using voice recognition software. Every effort was made to ensure accuracy; however, inadvertent computerized transcription errors may be present      Wynetta Emery, PA-C 12/01/12 [phone]

## 2012-12-01 NOTE — ED Notes (Signed)
Per EMS, pt here with Shortness of breath starting a little after midnight.  Pt has extensive hx with spontaneous pneumo, spina bifida, htn.  Pt also has hx of panic attacks.  Takes xanax and has been out for 2 days ago.  Pt also states she woke up with pain in right side tonight.  Vitals: ra spo2 98%, 2l placed for comfort.  bp 136/88, pulse 108. resp 30-34.  Pt able to speak in complete sentences and per EMS, lungs are equal in all lobes.  No LOC, No n/v/d.  Pt on arrival states no pain and no shortness of breath.  Symptoms improving.

## 2012-12-02 ENCOUNTER — Telehealth: Payer: Self-pay | Admitting: Internal Medicine

## 2012-12-02 MED ORDER — ACLIDINIUM BROMIDE 400 MCG/ACT IN AEPB: 1.0000 | INHALATION_SPRAY | Freq: Two times a day (BID) | RESPIRATORY_TRACT | Status: DC

## 2012-12-02 NOTE — Telephone Encounter (Signed)
Pt called stated Dr. Felicity Coyer prescript Spiriva for her to take. Pt stated that this med increase her heart rate and she does not feel good when she takes it. Please advise on what else we can do.  Pt is aware that Dr. Felicity Coyer is not in the office.

## 2012-12-02 NOTE — Telephone Encounter (Signed)
Ok to try change to [place] - done emr  Will let Dr Felicity Coyer know as well

## 2012-12-03 NOTE — Telephone Encounter (Signed)
Patient informed OF MD instructions

## 2012-12-06 ENCOUNTER — Other Ambulatory Visit: Payer: Self-pay | Admitting: *Deleted

## 2012-12-06 MED ORDER — OMEPRAZOLE 20 MG PO CPDR: 20.0000 mg | DELAYED_RELEASE_CAPSULE | Freq: Every day | ORAL | Status: DC

## 2012-12-13 ENCOUNTER — Telehealth: Payer: Self-pay | Admitting: *Deleted

## 2012-12-13 MED ORDER — MORPHINE SULFATE ER 100 MG PO TBCR: 100.0000 mg | EXTENDED_RELEASE_TABLET | Freq: Two times a day (BID) | ORAL | Status: DC

## 2012-12-13 NOTE — Telephone Encounter (Signed)
Spoke with pt advised Rx ready for pick up 

## 2012-12-13 NOTE — Telephone Encounter (Signed)
Pt called requesting refill on MS Contin.  Pt further states she only has transportation today and tomorrow states she knows refill isnt due til Saturday.  Please advise.

## 2012-12-13 NOTE — Telephone Encounter (Signed)
Refill printed with instructions not to fill more than once every 30 days - ok to pick up now

## 2012-12-14 ENCOUNTER — Telehealth: Payer: Self-pay | Admitting: *Deleted

## 2012-12-14 ENCOUNTER — Other Ambulatory Visit: Payer: Self-pay

## 2012-12-14 DIAGNOSIS — R05 Cough: Secondary | ICD-10-CM

## 2012-12-14 MED ORDER — LEVETIRACETAM 250 MG PO TABS: 250.0000 mg | ORAL_TABLET | Freq: Two times a day (BID) | ORAL | Status: DC

## 2012-12-14 NOTE — Telephone Encounter (Signed)
As per advice given by Dr Jonny Ruiz in my abscence 7/31 (in EMR phone note): see copy below Stop Spiriva Take Turdorza I will also refer to pulm for follow up on this issue given chornic and unimproved symptoms  PCC will call re: same   [name] B Ewing at 12/03/2012 9:31 AM   Status: Signed            Patient informed OF MD instructions        Corwin Levins, MD at 12/02/2012 5:28 PM    Status: Signed             Ok to try change to [place] - done emr  Will let Dr Felicity Coyer know as well        Phetcharat Noitamyae at 12/02/2012 9:01 AM    Status: Signed             Pt called stated Dr. Felicity Coyer prescript Spiriva for her to take. Pt stated that this med increase her heart rate and she does not feel good when she takes it. Please advise on what else we can do. Pt is aware that Dr. Felicity Coyer is not in the office.

## 2012-12-14 NOTE — Telephone Encounter (Signed)
Patient called requesting refills on Keppra to last until her appt in Sept.

## 2012-12-14 NOTE — Telephone Encounter (Signed)
Pt called states the Spiriva caused fast heart rate.  Please advise

## 2012-12-22 ENCOUNTER — Ambulatory Visit (INDEPENDENT_AMBULATORY_CARE_PROVIDER_SITE_OTHER): Payer: Medicare Other | Admitting: Pulmonary Disease

## 2012-12-22 DIAGNOSIS — R0602 Shortness of breath: Secondary | ICD-10-CM

## 2012-12-22 LAB — PULMONARY FUNCTION TEST

## 2012-12-22 NOTE — Progress Notes (Signed)
PFT done today. 

## 2012-12-23 ENCOUNTER — Other Ambulatory Visit: Payer: Self-pay | Admitting: *Deleted

## 2012-12-23 ENCOUNTER — Telehealth: Payer: Self-pay | Admitting: *Deleted

## 2012-12-23 NOTE — Telephone Encounter (Signed)
Received fax pt needing PA on Tudorza inhaler. Contacted insurance fax over PA form has been completed and fax back waiting on approval status...Raechel Chute

## 2012-12-23 NOTE — Telephone Encounter (Signed)
Received Pa back med has been approve. Notified pharmacy spoke with nock gave approval status...Raechel Chute

## 2012-12-23 NOTE — Telephone Encounter (Signed)
Called pt with results of her PFT. MD states PFT show mild COPD. If continuing cough & breathing problem will need to see pulmonologist Let her know and she will do a referral.../lmb

## 2012-12-31 ENCOUNTER — Encounter: Payer: Self-pay | Admitting: Internal Medicine

## 2013-01-06 ENCOUNTER — Other Ambulatory Visit: Payer: Self-pay | Admitting: Internal Medicine

## 2013-01-06 ENCOUNTER — Telehealth: Payer: Self-pay | Admitting: *Deleted

## 2013-01-06 MED ORDER — MORPHINE SULFATE ER 100 MG PO TBCR: 100.0000 mg | EXTENDED_RELEASE_TABLET | Freq: Two times a day (BID) | ORAL | Status: DC

## 2013-01-06 NOTE — Telephone Encounter (Signed)
Pt called requesting Morphine refill.  Last OV 7.25.2014.  Please advise

## 2013-01-06 NOTE — Telephone Encounter (Signed)
ok 

## 2013-01-06 NOTE — Telephone Encounter (Signed)
Lf msg on VM Rx ready for pick up

## 2013-01-12 ENCOUNTER — Telehealth: Payer: Self-pay | Admitting: *Deleted

## 2013-01-12 ENCOUNTER — Institutional Professional Consult (permissible substitution): Payer: Self-pay | Admitting: Internal Medicine

## 2013-01-12 NOTE — Telephone Encounter (Signed)
Pt called to inform MD she had to cancel her pulmonary appoint for 9.10.14 and has rescheduled it for 10.20.14.

## 2013-01-29 ENCOUNTER — Encounter: Payer: Self-pay | Admitting: Neurology

## 2013-02-01 ENCOUNTER — Ambulatory Visit: Payer: Medicare Other | Admitting: Neurology

## 2013-02-03 ENCOUNTER — Other Ambulatory Visit: Payer: Self-pay | Admitting: Internal Medicine

## 2013-02-03 ENCOUNTER — Telehealth: Payer: Self-pay | Admitting: *Deleted

## 2013-02-03 MED ORDER — MORPHINE SULFATE ER 100 MG PO TBCR: 100.0000 mg | EXTENDED_RELEASE_TABLET | Freq: Two times a day (BID) | ORAL | Status: DC

## 2013-02-03 NOTE — Telephone Encounter (Signed)
Pt called requesting MSContin refill.  Last OV 7.25.14.  Please advise in Dr Laverna Peace absence.

## 2013-02-03 NOTE — Telephone Encounter (Signed)
Faxed script bck to [facility].../lmb 

## 2013-02-03 NOTE — Telephone Encounter (Signed)
Notified pt rx ready for pick-up.../lmb 

## 2013-02-03 NOTE — Telephone Encounter (Signed)
Done hardcopy to robin  

## 2013-02-08 ENCOUNTER — Other Ambulatory Visit: Payer: Self-pay | Admitting: *Deleted

## 2013-02-08 MED ORDER — HYDROCOD POLST-CHLORPHEN POLST 10-8 MG/5ML PO LQCR: 5.0000 mL | Freq: Two times a day (BID) | ORAL | Status: DC | PRN

## 2013-02-08 NOTE — Telephone Encounter (Signed)
Notified pt rx ready for pick-up.../lmb 

## 2013-02-08 NOTE — Telephone Encounter (Signed)
Pt called states she has a cough and is requesting Tussionex.  Please advise

## 2013-02-08 NOTE — Telephone Encounter (Signed)
Okay. Please print I will sign for pickup

## 2013-02-21 ENCOUNTER — Institutional Professional Consult (permissible substitution): Payer: Self-pay | Admitting: Internal Medicine

## 2013-03-04 ENCOUNTER — Encounter: Payer: Self-pay | Admitting: Internal Medicine

## 2013-03-04 ENCOUNTER — Telehealth: Payer: Self-pay | Admitting: Internal Medicine

## 2013-03-04 ENCOUNTER — Ambulatory Visit (INDEPENDENT_AMBULATORY_CARE_PROVIDER_SITE_OTHER): Payer: Medicare Other | Admitting: Internal Medicine

## 2013-03-04 VITALS — BP 112/82 | HR 128 | Temp 99.0°F

## 2013-03-04 DIAGNOSIS — N390 Urinary tract infection, site not specified: Secondary | ICD-10-CM

## 2013-03-04 LAB — POCT URINALYSIS DIPSTICK
Bilirubin, UA: NEGATIVE
Ketones, UA: NEGATIVE
Protein, UA: NEGATIVE
Spec Grav, UA: <=1.005
pH, UA: 5

## 2013-03-04 MED ORDER — ALPRAZOLAM 0.5 MG PO TABS: 0.5000 mg | ORAL_TABLET | Freq: Two times a day (BID) | ORAL | Status: DC | PRN

## 2013-03-04 MED ORDER — MORPHINE SULFATE ER 100 MG PO TBCR: 100.0000 mg | EXTENDED_RELEASE_TABLET | Freq: Two times a day (BID) | ORAL | Status: DC

## 2013-03-04 MED ORDER — CIPROFLOXACIN HCL 500 MG PO TABS: 500.0000 mg | ORAL_TABLET | Freq: Two times a day (BID) | ORAL | Status: DC

## 2013-03-04 NOTE — Telephone Encounter (Signed)
03/04/2013  Pt left message asking if we received fax from pharmacy regarding approval of rx.  Pt did specify which rx.

## 2013-03-04 NOTE — Patient Instructions (Signed)
It was good to see you today.  Cipro antibiotics  Refills as discussed - given to you to take to pharmacy

## 2013-03-04 NOTE — Progress Notes (Signed)
HPI: complains of UTI symptoms Onset 8 days ago, progressively worse associated with dysuria and small volume voiding with increased frequency denies hematuria, flank pain or fever The patient has a history of prior UTI  PMH: reviewed  ROS:  Gen.: No unexpected weight change, no night sweats Lungs: No cough or shortness of breath Cardiovascular: No palpitations or chest pain  PE: BP 112/82  Pulse 128  Temp(Src) 99 F (37.2 C) (Oral)  SpO2 94% General: No acute distress Lungs: Clear to auscultation Cardiovascular: Regular rate rhythm, no edema Abdomen: Mild to moderate discomfort of her suprapubic region, no flank tenderness to palpation  Lab Results  Component Value Date   WBC 9.2 12/01/2012   HGB 14.8 12/01/2012   HCT 43.2 12/01/2012   PLT 292 12/01/2012   GLUCOSE 133* 12/01/2012   CHOL 195 07/21/2011   TRIG 223.0* 07/21/2011   HDL 49.80 07/21/2011   LDLDIRECT 112.3 07/21/2011   ALT 16 09/20/2012   AST 21 09/20/2012   NA 133* 12/01/2012   K 3.6 12/01/2012   CL 100 12/01/2012   CREATININE 0.88 12/01/2012   BUN 16 12/01/2012   CO2 21 12/01/2012   TSH 1.03 09/20/2012   INR 0.95 09/16/2011   HGBA1C 5.4 05/08/2008   POC urine - reviewed: +LE  Assessment/Plan: UTI, classic symptoms with history of same  Empiric antibiotic x7 days Hydration recommended education provided

## 2013-03-04 NOTE — Telephone Encounter (Signed)
Pt has made appt for UTI sxs...lmb

## 2013-03-08 ENCOUNTER — Other Ambulatory Visit: Payer: Self-pay | Admitting: Internal Medicine

## 2013-03-21 ENCOUNTER — Ambulatory Visit (INDEPENDENT_AMBULATORY_CARE_PROVIDER_SITE_OTHER): Payer: Medicare Other | Admitting: Internal Medicine

## 2013-03-21 VITALS — BP 122/82 | HR 88 | Ht <= 58 in | Wt 113.6 lb

## 2013-03-21 DIAGNOSIS — J869 Pyothorax without fistula: Secondary | ICD-10-CM

## 2013-03-21 DIAGNOSIS — J449 Chronic obstructive pulmonary disease, unspecified: Secondary | ICD-10-CM

## 2013-03-21 NOTE — Progress Notes (Signed)
Subjective:    Patient ID: Rhonda Ramos, female    DOB: [birth year], 47 y.o.   MRN: [id number]   PCP Rene Paci, MD   HPI  IOV 03/21/2013  47 year old female with spina bifida, scoliosis, chronic pain syndrome, seizure disorder and history of narcotic abuse with detox in the fall of 2011. Prior smoker who quit in 2011 who back in April 2013 had right-sided pneumothorax spontaneous status post chest tube and subsequent ARDS and in May 2013 suffered from spontaneous pneumothorax in the status post bullectomy of the left side  Subsequent to all this has been under the care of her primary care physician. Then in August 2014 she had pulmonary function tests that showed mild obstructive lung disease but with normal diffusion capacity. She continued to complain of dyspnea on exertion that is stable and chronic for many years. She had 2 trials of 2 different long-acting anticholinergics but had tachycardia in response. Therefore she did not tolerate these inhalers and has been referred here.  Currently she reports stable baseline health with dyspnea on exertion for walking using a walker which is baseline for her being the predominant symptom. There is associated cough and mucus that is mild/moderate in severity but all of these unchanged. COPD symptom Score is as below  Since Critical illness only had fu CXR July 2014 that showed clearance of pneumonia   LAbs CT April 2013: On vent, loculated effusion CXR July 2014: imprved   Pulmonary function test 12/22/2012  - FEV1 post bronchodilator 1.63/77% ratio 74. Total lung capacity 3.2 L 86%. DLCO 11.9/87%. Mild obstruction without a bronchodilator response     CAT COPD Symptom & Quality of Life Score (GSK trademark) 0 is no burden. 5 is highest burden 03/21/2013   Never Cough -> Cough all the time 3  No phlegm in chest -> Chest is full of phlegm 3  No chest tightness -> Chest feels very tight 1  No dyspnea for 1 flight stairs/hill ->  Very dyspneic for 1 flight of stairs 5  No limitations for ADL at home -> Very limited with ADL at home 4  Confident leaving home -> Not at all confident leaving home 1  Sleep soundly -> Do not sleep soundly because of lung condition 4  Lots of Energy -> No energy at all 4  TOTAL Score (max 40)  25      Exposure   History  Smoking status  . Former Smoker -- 3.00 packs/day for 27 years  . Types: Cigarettes  Smokeless tobacco  . Former Neurosurgeon  . Quit date: 02/07/2010     Chief Complaint  Patient presents with  . Pulmonary Consult    for OCPD, pt had PFT done in August. Pt c/o having productive cough that is worse at night and some SOB.   . Medication Problem    pt tried tudorza and spiriva but stopped them due to making her heart race.       Past Medical History  Diagnosis Date  . Chronic pain syndrome     s/p narc detox fall 2011  . SPINA BIFIDA   . Scoliosis     multiple surg for same  . DEPRESSION     > ptsd (spouse suicide/gsw 11/2005  . GERD   . HYPERTENSION   . Anxiety   . Pneumothorax on right 04/03/11, 06/10/11    recurrent s/p VATS 2/13  . Seizures     onset fall 2011 with narc detox  . Chronic  back pain greater than 3 months duration   . 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 Urinary tract infection 08/08/2011    E. coli  . Spontaneous tension pneumothorax 09/17/2011    Left spontaneous pneumothorax - first occurence     Family History  Problem Relation Age of Onset  . Diabetes Father   . Arthritis Other     parent & grandparent  . Diabetes Other     grandparent     History   Social History  . Marital Status: Widowed    Spouse Name: N/A    Number of Children: N/A  . Years of Education: N/A   Occupational History  . Not on file.   Social History Main Topics  . Smoking status: Former Smoker -- 3.00 packs/day for 27 years    Types: Cigarettes  . Smokeless tobacco: Former Neurosurgeon    Quit date:  02/07/2010  . 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 Drug Use: No  . Sexual Activity: No   Other Topics Concern  . Not on file   Social History Narrative  . No narrative on file     Allergies  Allergen Reactions  . Latex Shortness Of Breath  . Codeine Nausea And Vomiting  . Effexor [Venlafaxine Hcl]     Hallucination  . Erythromycin Nausea And Vomiting  . Sulfa Antibiotics Nausea And Vomiting     Outpatient Prescriptions Prior to Visit  Medication Sig Dispense Refill  . albuterol (PROVENTIL HFA;VENTOLIN HFA) 108 (90 BASE) MCG/ACT inhaler Inhale 2 puffs into the lungs every 6 (six) hours as needed for wheezing.      Marland Kitchen ALPRAZolam (XANAX) 0.5 MG tablet Take 1 tablet (0.5 mg total) by mouth 2 (two) times daily as needed for sleep or anxiety.  60 tablet  5  . amLODipine (NORVASC) 5 MG tablet Take 5 mg by mouth daily.      . cyclobenzaprine (FLEXERIL) 10 MG tablet Take 1 tablet (10 mg total) by mouth 3 (three) times daily as needed for muscle spasms.  60 tablet  3  . FLUoxetine (PROZAC) 20 MG capsule take 3 capsules by mouth once daily  90 capsule  5  . furosemide (LASIX) 40 MG tablet Take 1 tablet (40 mg total) by mouth daily as needed.  30 tablet  1  . [name] 0.35 MG tablet take 1 tablet by mouth once daily  28 tablet  0  . hydrOXYzine (VISTARIL) 25 MG capsule Take 1 capsule (25 mg total) by mouth 3 (three) times daily as needed for itching.  30 capsule  0  . levETIRAcetam (KEPPRA) 250 MG tablet Take 1 tablet (250 mg total) by mouth 2 (two) times daily.  60 tablet  1  . morphine (MS CONTIN) 100 MG 12 hr tablet Take 1 tablet (100 mg total) by mouth 2 (two) times daily. Do not fill more than once every 30 days  60 tablet  0  . Multiple Vitamin (MULITIVITAMIN WITH MINERALS) TABS Take 1 tablet by mouth daily.      Marland Kitchen omeprazole (PRILOSEC) 20 MG capsule Take 1 capsule (20 mg total) by mouth daily.  30 capsule  5  . oxybutynin  (DITROPAN-XL) 10 MG 24 hr tablet take 1 tablet by mouth once daily  30 tablet  5  . triamcinolone cream (KENALOG) 0.1 % Apply topically 2 (two) times daily.  454 g  0  . amLODipine (NORVASC) 5 MG tablet take 1 tablet by mouth once daily  90 tablet  3  . ciprofloxacin (CIPRO) 500 MG tablet Take 1 tablet (500 mg total) by mouth 2 (two) times daily.  14 tablet  0  . Aclidinium Bromide (TUDORZA PRESSAIR) 400 MCG/ACT AEPB Inhale 1 puff into the lungs 2 (two) times daily.  1 each  11  . SPIRIVA HANDIHALER 18 MCG inhalation capsule       . FLUoxetine (PROZAC) 20 MG capsule Take 60 mg by mouth daily.       No facility-administered medications prior to visit.       Review of Systems  Constitutional: Negative for fever and unexpected weight change.  HENT: Positive for congestion. Negative for dental problem, ear pain, nosebleeds, postnasal drip, rhinorrhea, sinus pressure, sneezing, sore throat and trouble swallowing.   Eyes: Negative for redness and itching.  Respiratory: Positive for cough and shortness of breath. Negative for chest tightness and wheezing.   Cardiovascular: Negative for palpitations and leg swelling.  Gastrointestinal: Negative for nausea and vomiting.  Genitourinary: Negative for dysuria.  Musculoskeletal: Negative for joint swelling.  Skin: Negative for rash.  Neurological: Negative for headaches.  Hematological: Does not bruise/bleed easily.  Psychiatric/Behavioral: Negative for dysphoric mood. The patient is not nervous/anxious.        Objective:   Physical Exam  Vitals reviewed. Constitutional: She is oriented to person, place, and time. She appears well-developed and well-nourished. No distress.  Short stature Uses Walker  HENT:  Head: Normocephalic and atraumatic.  Right Ear: External ear normal.  Left Ear: External ear normal.  Mouth/Throat: Oropharynx is clear and moist. No oropharyngeal exudate.  ? edentulous  Eyes: Conjunctivae and EOM are normal. Pupils  are equal, round, and reactive to light. Right eye exhibits no discharge. Left eye exhibits no discharge. No scleral icterus.  Neck: Normal range of motion. Neck supple. No JVD present. No tracheal deviation present. No thyromegaly present.  Cardiovascular: Normal rate, regular rhythm, normal heart sounds and intact distal pulses.  Exam reveals no gallop and no friction rub.   No murmur heard. Pulmonary/Chest: Effort normal and breath sounds normal. No respiratory distress. She has no wheezes. She has no rales. She exhibits no tenderness.  Scoliotic +  Abdominal: Soft. Bowel sounds are normal. She exhibits no distension and no mass. There is no tenderness. There is no rebound and no guarding.  Musculoskeletal: Normal range of motion. She exhibits no edema and no tenderness.  Lymphadenopathy:    She has no cervical adenopathy.  Neurological: She is alert and oriented to person, place, and time. She has normal reflexes. No cranial nerve deficit. She exhibits normal muscle tone. Coordination normal.  Skin: Skin is warm and dry. No rash noted. She is not diaphoretic. No erythema. No pallor.  Psychiatric: She has a normal mood and affect. Her behavior is normal. Judgment and thought content normal.          Assessment & Plan:

## 2013-03-21 NOTE — Patient Instructions (Addendum)
Try QVAR 2 puff twice daily  - use daily without fail  - CMA will teach you how to use it Have CXR for followup empyemia and acute resp failure  - initially ordered CT but she cannot lie flat due to spina bifida REturn in 1 month to see me or NP Tammy to report progress

## 2013-03-22 ENCOUNTER — Encounter: Payer: Self-pay | Admitting: Internal Medicine

## 2013-03-22 DIAGNOSIS — J449 Chronic obstructive pulmonary disease, unspecified: Secondary | ICD-10-CM | POA: Insufficient documentation

## 2013-03-22 DIAGNOSIS — J869 Pyothorax without fistula: Secondary | ICD-10-CM | POA: Insufficient documentation

## 2013-03-22 NOTE — Assessment & Plan Note (Signed)
Needs Ct chest for fu of empyema imaging from 2013 but she cannot lie flat so will get cxr

## 2013-03-22 NOTE — Assessment & Plan Note (Signed)
Currently stable disease  PLAN Try QVAR 2 puff twice daily  - use daily without fail  - CMA will teach you how to use it Have CXR for followup empyemia and acute resp failure  - initially ordered CT but she cannot lie flat due to spina bifida REturn in 1 month to see me or NP Tammy to report progress

## 2013-04-01 ENCOUNTER — Telehealth: Payer: Self-pay | Admitting: *Deleted

## 2013-04-01 MED ORDER — MORPHINE SULFATE ER 100 MG PO TBCR: 100.0000 mg | EXTENDED_RELEASE_TABLET | Freq: Two times a day (BID) | ORAL | Status: DC

## 2013-04-01 NOTE — Telephone Encounter (Signed)
Ok done

## 2013-04-01 NOTE — Telephone Encounter (Signed)
Pt called requesting a refill for MS Contin 100mg  Last OV 10.31.14 Last filled 10.31.14

## 2013-04-21 ENCOUNTER — Ambulatory Visit: Payer: Self-pay | Admitting: Adult Health

## 2013-05-03 ENCOUNTER — Telehealth: Payer: Self-pay | Admitting: *Deleted

## 2013-05-03 MED ORDER — MORPHINE SULFATE ER 100 MG PO TBCR: 100.0000 mg | EXTENDED_RELEASE_TABLET | Freq: Two times a day (BID) | ORAL | Status: DC

## 2013-05-03 NOTE — Telephone Encounter (Signed)
Pt called requesting MS Contin refill.  Please advise in Dr Diamantina Monks absence.

## 2013-05-03 NOTE — Telephone Encounter (Signed)
Done hardcopy to robin  

## 2013-05-03 NOTE — Telephone Encounter (Signed)
Called the patient informed to pickup hardcopy at the front desk. 

## 2013-05-17 ENCOUNTER — Telehealth: Payer: Self-pay | Admitting: Nurse Practitioner

## 2013-05-17 MED ORDER — LEVETIRACETAM 250 MG PO TABS: 250.0000 mg | ORAL_TABLET | Freq: Two times a day (BID) | ORAL | Status: DC

## 2013-05-17 NOTE — Telephone Encounter (Signed)
Patient has scheduled appt w/ Dr Terrace ArabiaYan, needs refill -keppra

## 2013-05-17 NOTE — Telephone Encounter (Signed)
Patient needs refill of Keppra - pharmacy told her she needs to be seen first - patient has cancelled previous appointments. Please call to advise.

## 2013-05-27 ENCOUNTER — Encounter (INDEPENDENT_AMBULATORY_CARE_PROVIDER_SITE_OTHER): Payer: Self-pay

## 2013-05-27 ENCOUNTER — Encounter: Payer: Self-pay | Admitting: Neurology

## 2013-05-27 ENCOUNTER — Ambulatory Visit (INDEPENDENT_AMBULATORY_CARE_PROVIDER_SITE_OTHER): Payer: Medicare Other | Admitting: Neurology

## 2013-05-27 VITALS — Ht <= 58 in | Wt 111.0 lb

## 2013-05-27 DIAGNOSIS — R569 Unspecified convulsions: Secondary | ICD-10-CM

## 2013-05-27 MED ORDER — LEVETIRACETAM ER 500 MG PO TB24
500.0000 mg | ORAL_TABLET | Freq: Every day | ORAL | Status: DC
Start: 2013-05-27 — End: 2013-11-14

## 2013-05-27 NOTE — Progress Notes (Signed)
[facility]  PATIENT: Rhonda Ramos DOB: Apr 11, 1966  HISTORICAL [name] is a 48 year-old Caucasian female accompanied by her fiance at today's visit, last visit was 2012.  She past medical history of HTN, scoliosis, spina bifida, depression, and polysubstance abuse, prescription drug use,  last use was in 2011, she went to rehab for that,  I saw her in 2012 for hospital follow up for seizures.   She suffered a seizure-like episode around March 31, 2010, that resulted in her hospitalization, and during this hospitalization, she admitted to an additional similar episode around March 04, 2010 while she was at rehab. She is amnestic for the episodes, but  review of records from the hospital reveals that her father witnessed the episode. She was found "mumbling" and "making no sense," and then became very stiff for a brief period of time followed by tonic-clonic type activity. It is unknown whether she had urinary incontinence, as she self-catheterizes. There was no tongue biting,   Work up in 200 showed negative MRI and MRA of the brain, as well as a normal EEG.   She was ultimately discharged on Keppra, 250 mg PO q12h for complex partial seizure. She has had no side effects from the Keppra.  While the reason for her visit today was follow-up for her seizure-like episodes, she asked for pain medication during her visit in Feb 2011 The patient listed numerous different medications she has apparently been on in the past, including Opana, MS Contin, oxycontin, Fentanyl patches, and tramadol. She has tried numerous NSAIDs without much relief. She says she's found that what works for her best is the "instant release" medicine "oxycodone without the acetaminophen."   She was previously seen by [facility] or [facility] (she cannot recall which, but thinks it may have been [facility]) for several years, but was dismissed from the practice about 1 year  ago, reportedly for "being short 7 pills." There is also a note on record from this practice many years ago with Dr. Orlin Hilding, who also dismissed her from this practice for similar issues because narcotic pain medicine being potentially abused.   UPDATE Jan 23/2015: Over the past few years, she was combating with pneumonia and lung collapes in 2013, 2014, there was no seizure like activities since.  She admitted that she is only take keppra 500mg  po qday, not suggested 2 tabs, she wants refill of medication.    She is taking MS contin for her low back pain, she is getting MS contin 100mg  bid filled by her primary care Dr. Felicity Coyer, she is less active now.      REVIEW OF SYSTEMS: Full 14 system review of systems performed and notable only for low back pain, gait difficulty,  ALLERGIES: Allergies  Allergen Reactions  . Latex Shortness Of Breath  . Codeine Nausea And Vomiting  . Effexor [Venlafaxine Hcl]     Hallucination  . Erythromycin Nausea And Vomiting  . Sulfa Antibiotics Nausea And Vomiting    HOME MEDICATIONS: Outpatient Prescriptions Prior to Visit  Medication Sig Dispense Refill  . albuterol (PROVENTIL HFA;VENTOLIN HFA) 108 (90 BASE) MCG/ACT inhaler Inhale 2 puffs into the lungs every 6 (six) hours as needed for wheezing.      Marland Kitchen ALPRAZolam (XANAX) 0.5 MG tablet Take 1 tablet (0.5 mg total) by mouth 2 (two) times daily as needed for sleep or anxiety.  60 tablet  5  . amLODipine (NORVASC) 5 MG tablet Take 5 mg by  mouth daily.      . cyclobenzaprine (FLEXERIL) 10 MG tablet Take 1 tablet (10 mg total) by mouth 3 (three) times daily as needed for muscle spasms.  60 tablet  3  . FLUoxetine (PROZAC) 20 MG capsule take 3 capsules by mouth once daily  90 capsule  5  . [name] 0.35 MG tablet take 1 tablet by mouth once daily  28 tablet  0  . levETIRAcetam (KEPPRA) 250 MG tablet Take 1 tablet (250 mg total) by mouth 2 (two) times daily.  60 tablet  0  . morphine (MS CONTIN) 100 MG 12 hr  tablet Take 1 tablet (100 mg total) by mouth 2 (two) times daily. Do not fill more than once every 30 days  60 tablet  0  . Multiple Vitamin (MULITIVITAMIN WITH MINERALS) TABS Take 1 tablet by mouth daily.      Marland Kitchen omeprazole (PRILOSEC) 20 MG capsule Take 1 capsule (20 mg total) by mouth daily.  30 capsule  5  . oxybutynin (DITROPAN-XL) 10 MG 24 hr tablet take 1 tablet by mouth once daily  30 tablet  5  . triamcinolone cream (KENALOG) 0.1 % Apply topically 2 (two) times daily.  454 g  0  . Aclidinium Bromide (TUDORZA PRESSAIR) 400 MCG/ACT AEPB Inhale 1 puff into the lungs 2 (two) times daily.  1 each  11  . furosemide (LASIX) 40 MG tablet Take 1 tablet (40 mg total) by mouth daily as needed.  30 tablet  1  . hydrOXYzine (VISTARIL) 25 MG capsule Take 1 capsule (25 mg total) by mouth 3 (three) times daily as needed for itching.  30 capsule  0  . SPIRIVA HANDIHALER 18 MCG inhalation capsule        No facility-administered medications prior to visit.    PAST MEDICAL HISTORY: Past Medical History  Diagnosis Date  . Chronic pain syndrome     s/p narc detox fall 2011  . SPINA BIFIDA   . Scoliosis     multiple surg for same  . DEPRESSION     > ptsd (spouse suicide/gsw 11/2005  . GERD   . HYPERTENSION   . Anxiety   . Pneumothorax on right 04/03/11, 06/10/11    recurrent s/p VATS 2/13  . Seizures     onset fall 2011 with narc detox  . Chronic back pain greater than 3 months duration   . 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 Urinary tract infection 08/08/2011    E. coli  . Spontaneous tension pneumothorax 09/17/2011    Left spontaneous pneumothorax - first occurence    PAST SURGICAL HISTORY: Past Surgical History  Procedure Laterality Date  . Lumbar laminectomy for tethered cord release      x's 4 (84,94,98, and 02)  . 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 Scoliosis  1982    neck fused w/bones  . Chest tube placement  04/03/11     S/P large right sided pneumo  . Back surgery    . Video assisted thoracoscopy  06/11/2011    Procedure: VIDEO ASSISTED THORACOSCOPY;  Surgeon: Delight Ovens, MD;  Location: [facility] OR;  Service: Thoracic;  Laterality: Right;  Stapling of Large Apical and Inferiors blebs; Mechanical Pleurodesis   . Resection of apical bleb  06/11/2011    Procedure: RESECTION OF APICAL BLEB;  Surgeon: Delight Ovens, MD;  Location: [facility] OR;  Service: Thoracic;  Laterality: Right;  and inferior bleb    FAMILY HISTORY: Family History  Problem Relation Age of Onset  . Diabetes Father   . Arthritis Other     parent & grandparent  . Diabetes Other     grandparent    SOCIAL HISTORY:  History   Social History  . Marital Status: Widowed    Spouse Name: N/A    Number of Children: 0  . Years of Education: 14   Occupational History  .      disabled   Social History Main Topics  . Smoking status: Former Smoker -- 3.00 packs/day for 27 years    Types: Cigarettes  . Smokeless tobacco: Former NeurosurgeonUser    Quit date: 02/07/2010  . 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 Drug Use: No  . Sexual Activity: No   Other Topics Concern  . Not on file   Social History Narrative   Patient is disabled.   Patient is widowed.    Right handed.   Caffeine - Dr. Reino KentPepper       PHYSICAL EXAM   Filed Vitals:   05/27/13 1131  Height: 4\' 8"  (1.422 m)  Weight: 111 lb (50.349 kg)    Not recorded    Body mass index is 24.9 kg/(m^2).   Generalized: In no acute distress  Neck: Supple, no carotid bruits   Cardiac: Regular rate rhythm  Pulmonary: Clear to auscultation bilaterally  Musculoskeletal: No deformity  Neurological examination  Mentation: short statue, endenture, alert oriented to history taking, and casual conversation  Cranial nerve II-XII: Pupils were equal round reactive to light extraocular movements were full, Visual field were full on  confrontational test. Bilateral fundi were sharp.  Facial sensation and strength were normal. Hearing was intact to finger rubbing bilaterally. Uvula tongue midline.  head turning and shoulder shrug and were normal and symmetric.Tongue protrusion into cheek strength was normal.  Motor: bilateral upper extremity has normal bulk, tone, and strength, she wear rigid bilateral AFO, underdeveloped bilateral lower extremity, mild to moderate bilateral hip flexion weakness  Sensory: Intact to fine touch,   Coordination: Normal finger to nose  Gait: Rising up from seated position pushing on a chair arm, swing her bilateral hip, unsteady wide-based gait,  Deep tendon reflex:  bilateral upper extremity was hyporeflexia, absent bilateral patellar reflex   IAGNOSTIC DATA (LABS, IMAGING, TESTING) - I reviewed patient records, labs, notes, testing and imaging myself where available.  Lab Results  Component Value Date   WBC 9.2 12/01/2012   HGB 14.8 12/01/2012   HCT 43.2 12/01/2012   MCV 85.5 12/01/2012   PLT 292 12/01/2012      Component Value Date/Time   NA 133* 12/01/2012 0230   K 3.6 12/01/2012 0230   CL 100 12/01/2012 0230   CO2 21 12/01/2012 0230   GLUCOSE 133* 12/01/2012 0230   BUN 16 12/01/2012 0230   CREATININE 0.88 12/01/2012 0230   CALCIUM 9.7 12/01/2012 0230   PROT 7.8 09/20/2012 1346   ALBUMIN 4.1 09/20/2012 1346   AST 21 09/20/2012 1346   ALT 16 09/20/2012 1346   ALKPHOS 104 09/20/2012 1346   BILITOT 0.2* 09/20/2012 1346   GFRNONAA 78* 12/01/2012 0230   GFRAA >90 12/01/2012 0230   Lab Results  Component Value Date   CHOL 195 07/21/2011   HDL 49.80 07/21/2011   LDLDIRECT 112.3 07/21/2011   TRIG 223.0* 07/21/2011   CHOLHDL 4 07/21/2011  Lab Results  Component Value Date   HGBA1C 5.4 05/08/2008   No results found for this basename: VITAMINB12   Lab Results  Component Value Date   TSH 1.03 09/20/2012      ASSESSMENT AND PLAN    48 year old female, with past medical history of spinal  bifid presenting with 2 seizure in 2011 there was no recurrent seizure while taking Keppra 500 mg every day, she was not compliant with her medication of suggested twice a day   1 I have discussed with patient, she has 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 Felicity Coyer  3. RTC as needed,         Levert Feinstein, M.D. Ph.D.  [facility] [address] [place], [place] [address] [phone]

## 2013-05-31 ENCOUNTER — Telehealth: Payer: Self-pay | Admitting: Internal Medicine

## 2013-05-31 NOTE — Telephone Encounter (Signed)
Patient called and left a VM on the triage line requesting a refill on her morphine (MS CONTIN) 100 MG 12 hr tablet. She says that she will be out on Saturday. Please call when ready to pick-up.

## 2013-06-01 MED ORDER — MORPHINE SULFATE ER 100 MG PO TBCR: 100.0000 mg | EXTENDED_RELEASE_TABLET | Freq: Two times a day (BID) | ORAL | Status: DC

## 2013-06-01 NOTE — Telephone Encounter (Signed)
Notified pt rx ready for pick-up.../lmb 

## 2013-06-01 NOTE — Telephone Encounter (Signed)
Printed and signed.  

## 2013-06-03 ENCOUNTER — Ambulatory Visit: Payer: Medicare Other | Admitting: Internal Medicine

## 2013-06-03 DIAGNOSIS — Z0289 Encounter for other administrative examinations: Secondary | ICD-10-CM

## 2013-06-05 ENCOUNTER — Other Ambulatory Visit: Payer: Self-pay | Admitting: Family Medicine

## 2013-06-06 ENCOUNTER — Other Ambulatory Visit: Payer: Self-pay | Admitting: Internal Medicine

## 2013-06-06 NOTE — Telephone Encounter (Signed)
Patient has phoned again at 1058 this morning requesting refill on her vistaril.  According to her chart, it's been escribed 2/1 and already 2/2.  Please advise.  CB# [phone]

## 2013-06-06 NOTE — Telephone Encounter (Signed)
Let pt know same - pt can check with her pharmacy

## 2013-06-06 NOTE — Telephone Encounter (Signed)
Notified patient she verbalized understanding. 

## 2013-06-13 ENCOUNTER — Ambulatory Visit (INDEPENDENT_AMBULATORY_CARE_PROVIDER_SITE_OTHER)
Admission: RE | Admit: 2013-06-13 | Discharge: 2013-06-13 | Disposition: A | Discharge status: Home / Self Care | Payer: Medicare Other | Source: Ambulatory Visit | Attending: Internal Medicine | Admitting: Internal Medicine

## 2013-06-13 ENCOUNTER — Ambulatory Visit (INDEPENDENT_AMBULATORY_CARE_PROVIDER_SITE_OTHER): Payer: Medicare Other | Admitting: Internal Medicine

## 2013-06-13 ENCOUNTER — Encounter: Payer: Self-pay | Admitting: Internal Medicine

## 2013-06-13 VITALS — BP 110/70 | HR 90 | Temp 99.8°F

## 2013-06-13 DIAGNOSIS — J180 Bronchopneumonia, unspecified organism: Secondary | ICD-10-CM

## 2013-06-13 DIAGNOSIS — J9801 Acute bronchospasm: Secondary | ICD-10-CM

## 2013-06-13 DIAGNOSIS — J441 Chronic obstructive pulmonary disease with (acute) exacerbation: Secondary | ICD-10-CM

## 2013-06-13 DIAGNOSIS — R300 Dysuria: Secondary | ICD-10-CM

## 2013-06-13 MED ORDER — PREDNISONE (PAK) 10 MG PO TABS: ORAL_TABLET | ORAL | Status: DC

## 2013-06-13 MED ORDER — ALBUTEROL SULFATE HFA 108 (90 BASE) MCG/ACT IN AERS: 2.0000 | INHALATION_SPRAY | Freq: Four times a day (QID) | RESPIRATORY_TRACT | Status: DC | PRN

## 2013-06-13 MED ORDER — HYDROCODONE-HOMATROPINE 5-1.5 MG/5ML PO SYRP: 5.0000 mL | ORAL_SOLUTION | Freq: Three times a day (TID) | ORAL | Status: DC | PRN

## 2013-06-13 MED ORDER — HYDROXYZINE PAMOATE 50 MG PO CAPS: 50.0000 mg | ORAL_CAPSULE | Freq: Three times a day (TID) | ORAL | Status: DC | PRN

## 2013-06-13 MED ORDER — LEVOFLOXACIN 500 MG PO TABS: 500.0000 mg | ORAL_TABLET | Freq: Every day | ORAL | Status: DC
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
## 2013-06-13 NOTE — Patient Instructions (Signed)
It was good to see you today.  We have reviewed your prior records including labs and tests today  Chest xray and urine test ordered today. Your results will be released to MyChart (or called to you) after review, usually within 72hours after test completion. If any changes need to be made, you will be notified at that same time.  Albuterol nebulized treatment given in the office today for your wheezing - continue albuterol inhaler at home as needed for wheeze symptoms  Treat infection with Levaquin antibiotics once daily for 7 days, prednisone taper over next 6 days and Hydromet syrup as needed for cough  Also hydroxyzine 50 mg every 8 hours as needed for itch  Your prescription(s) have been submitted to your pharmacy. Please take as directed and contact our office if you believe you are having problem(s) with the medication(s).  Followup in 2 weeks if symptoms unimproved, call sooner if symptoms worse  Please avoid tobacco abuse, even secondhand exposure is bad for your lungs

## 2013-06-13 NOTE — Progress Notes (Signed)
Pre-visit discussion using our clinic review tool. No additional management support is needed unless otherwise documented below in the visit note.  

## 2013-06-14 ENCOUNTER — Telehealth: Payer: Self-pay | Admitting: *Deleted

## 2013-06-14 NOTE — Telephone Encounter (Signed)
No evidence for fluid, bronchitis or pneumonia. No pneumothorax or other problems. Mild chronic scarring from prior problems. No treatment changes recommended. Continue cough suppression and antibiotics as prescribed Call for followup visit if unimproved in next 5-7 days, sooner if worse

## 2013-06-14 NOTE — Telephone Encounter (Signed)
Patient phoned requesting results from xray performed yesterday.    CB# [phone]  Leave message if no answer

## 2013-06-14 NOTE — Telephone Encounter (Signed)
Notified patient of MD response and recommendations.  Understanding & appreciation verbalized.

## 2013-06-22 ENCOUNTER — Telehealth: Payer: Self-pay | Admitting: *Deleted

## 2013-06-22 NOTE — Telephone Encounter (Signed)
Notified patient.

## 2013-06-22 NOTE — Telephone Encounter (Signed)
WHAT??? If she is needing a PA if so we haven't received anything from her pharmacy pt need to contact them to have them to send us PA #...Raechel Chute/lmb

## 2013-06-22 NOTE — Telephone Encounter (Signed)
Patient phoned requesting medical necessity for her vistaril.    Please send to [phone]    CB# (pt) [phone]

## 2013-06-29 ENCOUNTER — Telehealth: Payer: Self-pay | Admitting: *Deleted

## 2013-06-29 MED ORDER — MORPHINE SULFATE ER 100 MG PO TBCR: 100.0000 mg | EXTENDED_RELEASE_TABLET | Freq: Two times a day (BID) | ORAL | Status: DC

## 2013-06-29 NOTE — Telephone Encounter (Signed)
Phoned & notified pt script available for p/u and explained newly implemented controlled substance contract & policy.  Pt verbalized understanding.

## 2013-06-29 NOTE — Telephone Encounter (Signed)
Patient phoned requesting refill for her MS Contin.  Last OV with PCP 06/13/13 and med last ordered 06/01/13-pt knows it is less than 30 days, but is trying to get script before inclement weather.  Is also asking that her boyfriend be able to pick up script Cristy Friedlander(Steve Kilar).  Please advise.  CB# [phone]

## 2013-06-29 NOTE — Telephone Encounter (Signed)
Ok this time- explain re: Assured Tox before additional/future refills -  thanks

## 2013-07-15 ENCOUNTER — Other Ambulatory Visit: Payer: Self-pay | Admitting: Internal Medicine

## 2013-07-29 ENCOUNTER — Ambulatory Visit (INDEPENDENT_AMBULATORY_CARE_PROVIDER_SITE_OTHER): Payer: Medicare Other | Admitting: Internal Medicine

## 2013-07-29 ENCOUNTER — Encounter: Payer: Self-pay | Admitting: Internal Medicine

## 2013-07-29 ENCOUNTER — Encounter: Payer: Self-pay | Admitting: *Deleted

## 2013-07-29 VITALS — BP 120/82 | HR 92 | Temp 98.0°F | Wt 113.1 lb

## 2013-07-29 DIAGNOSIS — Q059 Spina bifida, unspecified: Secondary | ICD-10-CM

## 2013-07-29 DIAGNOSIS — F329 Major depressive disorder, single episode, unspecified: Secondary | ICD-10-CM

## 2013-07-29 DIAGNOSIS — R7309 Other abnormal glucose: Secondary | ICD-10-CM

## 2013-07-29 DIAGNOSIS — I10 Essential (primary) hypertension: Secondary | ICD-10-CM

## 2013-07-29 DIAGNOSIS — G894 Chronic pain syndrome: Secondary | ICD-10-CM

## 2013-07-29 DIAGNOSIS — Z1239 Encounter for other screening for malignant neoplasm of breast: Secondary | ICD-10-CM

## 2013-07-29 DIAGNOSIS — R739 Hyperglycemia, unspecified: Secondary | ICD-10-CM

## 2013-07-29 DIAGNOSIS — F3289 Other specified depressive episodes: Secondary | ICD-10-CM

## 2013-07-29 MED ORDER — MORPHINE SULFATE ER 100 MG PO TBCR: 100.0000 mg | EXTENDED_RELEASE_TABLET | Freq: Two times a day (BID) | ORAL | Status: DC

## 2013-07-29 NOTE — Progress Notes (Signed)
Pre visit review using our clinic review tool, if applicable. No additional management support is needed unless otherwise documented below in the visit note. 
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
## 2013-07-29 NOTE — Patient Instructions (Signed)
It was good to see you today.  We have reviewed your prior records including labs and tests today  Test(s) ordered today. Your results will be released to MyChart (or called to you) after review, usually within 72hours after test completion. If any changes need to be made, you will be notified at that same time.  Medications reviewed and updated, no changes recommended at this time. Refill on medication(s) as discussed today.  Please schedule followup in 4 months, call sooner if problems.

## 2013-07-30 ENCOUNTER — Telehealth: Payer: Self-pay | Admitting: Internal Medicine

## 2013-07-30 NOTE — Assessment & Plan Note (Signed)
Multifactorial - chronic issue Chronic related to LBP, hips and scoli with many prior/remote surgeries exacerbated spring/summer 2013 with recurrent and bilateral pneumothorax requiring surgical interventions/chest tube Changed to MSContin 09/2011 hosp (prev duragesic patch) Stopped oxy 08/2011 for breakthru pain, but requests consideration of resuming same prn No breakthru relief with tramadol reviewed hx sz event (while tapering off narcotics fall 2011) continue refills per pain mgmt contract  - MAY consider oxy prn if UDS remains "clean", but no additional narcotics given today 09/2012 last "violation" lost 8 pills reviewed -pt understands and agrees another violation will prompt dismissal from this practice

## 2013-07-30 NOTE — Assessment & Plan Note (Signed)
The current medical regimen is effective;  continue present plan and medications. Check lipids BP Readings from Last 3 Encounters:  07/29/13 120/82  06/13/13 110/70  03/21/13 122/82

## 2013-07-30 NOTE — Assessment & Plan Note (Signed)
Overlap with anxiety Feels symptoms related to chronic pain condition previously enrolled with [facility] for counseling 12/2011 -2014, stopped 2015 due to $ On prozac+ xanax I declined to increase dose xanax and pt does not wish to change SSRI to other med (?cymbalta) at this time encouraged to take antihistamine qhs prn in addition to xanax rather than increase BZ dose

## 2013-07-30 NOTE — Telephone Encounter (Signed)
Relevant patient education assigned to patient using Emmi. ° °

## 2013-08-02 ENCOUNTER — Other Ambulatory Visit (INDEPENDENT_AMBULATORY_CARE_PROVIDER_SITE_OTHER): Payer: Medicare Other

## 2013-08-02 DIAGNOSIS — R7309 Other abnormal glucose: Secondary | ICD-10-CM

## 2013-08-02 DIAGNOSIS — I10 Essential (primary) hypertension: Secondary | ICD-10-CM

## 2013-08-02 DIAGNOSIS — R739 Hyperglycemia, unspecified: Secondary | ICD-10-CM

## 2013-08-02 LAB — LIPID PANEL
CHOL/HDL RATIO: 4
Cholesterol: 186 mg/dL (ref 0–200)
HDL: 43.4 mg/dL (ref >39.00)
LDL Cholesterol: 100 mg/dL — ABNORMAL HIGH (ref 0–99)
Triglycerides: 211 mg/dL — ABNORMAL HIGH (ref 0.0–149.0)
VLDL: 42.2 mg/dL — ABNORMAL HIGH (ref 0.0–40.0)

## 2013-08-02 LAB — HEMOGLOBIN A1C: HEMOGLOBIN A1C: 5.4 % (ref 4.6–6.5)

## 2013-08-14 ENCOUNTER — Other Ambulatory Visit: Payer: Self-pay | Admitting: Internal Medicine

## 2013-08-19 ENCOUNTER — Telehealth: Payer: Self-pay

## 2013-08-19 NOTE — Telephone Encounter (Signed)
Patient called requesting results of labs/urine screen. She is also requesting Rx for MS contin and states that oxycodone 15 mg was discussed for breakthrough. Thanks

## 2013-08-22 ENCOUNTER — Other Ambulatory Visit: Payer: Self-pay | Admitting: Internal Medicine

## 2013-08-22 MED ORDER — MORPHINE SULFATE ER 100 MG PO TBCR: 100.0000 mg | EXTENDED_RELEASE_TABLET | Freq: Two times a day (BID) | ORAL | Status: DC

## 2013-08-22 NOTE — Telephone Encounter (Signed)
Pt notified via mychart

## 2013-08-22 NOTE — Telephone Encounter (Signed)
[facility] - no other dose changes at this time (ie, no oxy 15)

## 2013-08-23 NOTE — Telephone Encounter (Addendum)
[facility] [phone]

## 2013-08-24 ENCOUNTER — Ambulatory Visit (INDEPENDENT_AMBULATORY_CARE_PROVIDER_SITE_OTHER): Payer: Medicare Other | Admitting: Internal Medicine

## 2013-08-24 ENCOUNTER — Encounter: Payer: Self-pay | Admitting: Internal Medicine

## 2013-08-24 VITALS — BP 128/84 | HR 96 | Temp 98.8°F | Wt 112.8 lb

## 2013-08-24 DIAGNOSIS — Q059 Spina bifida, unspecified: Secondary | ICD-10-CM

## 2013-08-24 DIAGNOSIS — T25029A Burn of unspecified degree of unspecified foot, initial encounter: Secondary | ICD-10-CM

## 2013-08-24 DIAGNOSIS — G894 Chronic pain syndrome: Secondary | ICD-10-CM

## 2013-08-24 DIAGNOSIS — T25022A Burn of unspecified degree of left foot, initial encounter: Secondary | ICD-10-CM

## 2013-08-24 MED ORDER — OXYCODONE HCL 15 MG PO TABS: 15.0000 mg | ORAL_TABLET | ORAL | Status: DC | PRN

## 2013-08-24 NOTE — Assessment & Plan Note (Signed)
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
## 2013-08-24 NOTE — Progress Notes (Signed)
Pre visit review using our clinic review tool, if applicable. No additional management support is needed unless otherwise documented below in the visit note. 

## 2013-08-24 NOTE — Patient Instructions (Signed)
It was good to see you today.  We have reviewed your prior records including labs and tests today  Medications reviewed and updated - ok for OxyIR for breakthrough as needed - I will not refill this monthly or increase amount/dose  we'll make referral to spine specialist for evaluation of her progressive weakness in the setting of spina bifida. Our office will contact you regarding appointment(s) once made.  Continue wound care as ongoing by soaking burn twice daily in warm soapy water, pat dry, cover with antibiotic ointment and bandage. Okay to avoid AFO brace until wound healed so long as you have support nearby  Please schedule followup in 3-4 months, call sooner if problems.

## 2013-08-24 NOTE — Assessment & Plan Note (Signed)
Multifactorial - chronic issue Chronic related to LBP, hips and scoli with many prior/remote surgeries exacerbated spring/summer 2013 with recurrent and bilateral pneumothorax requiring surgical interventions/chest tube Changed to MSContin 09/2011 hosp (prev duragesic patch) Stopped oxy 08/2011 for breakthru pain, but requests consideration of resuming same prn given increasing BLE pain/weakness No breakthru relief with tramadol reviewed hx sz event (while tapering off narcotics fall 2011) continue refills per pain mgmt contract  - 09/2012 last "violation" lost 8 pills reviewed -pt understands and agrees another violation will prompt dismissal from this practice

## 2013-08-26 ENCOUNTER — Encounter: Payer: Self-pay | Admitting: Internal Medicine

## 2013-08-30 ENCOUNTER — Ambulatory Visit (HOSPITAL_COMMUNITY)
Admission: RE | Admit: 2013-08-30 | Discharge: 2013-08-30 | Disposition: A | Discharge status: Home / Self Care | Payer: Medicare Other | Source: Ambulatory Visit | Attending: Internal Medicine | Admitting: Internal Medicine

## 2013-08-30 DIAGNOSIS — Z1239 Encounter for other screening for malignant neoplasm of breast: Secondary | ICD-10-CM

## 2013-08-30 DIAGNOSIS — Z1231 Encounter for screening mammogram for malignant neoplasm of breast: Secondary | ICD-10-CM | POA: Insufficient documentation

## 2013-09-01 ENCOUNTER — Telehealth: Payer: Self-pay | Admitting: *Deleted

## 2013-09-01 NOTE — Telephone Encounter (Signed)
Bonita QuinLinda from [facility] called states she needs office notes from pts face to face with MD discussing the frequency of pts self cathetering.  Needs "pt is cathing 4 times a day to equal 120 catheters a month using 12 french.   She is aware that MD is out of the office until next week, states she has until June to complete this.

## 2013-09-05 NOTE — Telephone Encounter (Signed)
This is documented under spina bifida from our 4/22 OV thanks

## 2013-09-05 NOTE — Telephone Encounter (Signed)
Spoke with Bonita QuinLinda, faxed visit notes as requested

## 2013-09-15 ENCOUNTER — Other Ambulatory Visit: Payer: Self-pay | Admitting: Internal Medicine

## 2013-09-20 ENCOUNTER — Telehealth: Payer: Self-pay | Admitting: *Deleted

## 2013-09-20 MED ORDER — MORPHINE SULFATE ER 100 MG PO TBCR: 100.0000 mg | EXTENDED_RELEASE_TABLET | Freq: Two times a day (BID) | ORAL | Status: DC

## 2013-09-20 NOTE — Telephone Encounter (Signed)
Done hardcopy to robin  

## 2013-09-20 NOTE — Telephone Encounter (Signed)
Notified pt rx ready for pick-up.../lmb 

## 2013-09-20 NOTE — Telephone Encounter (Signed)
Pt called requesting MS Contin refill.  Last refill 4.20.2015.  Please advise in Dr Diamantina MonksLeschber's absence

## 2013-09-28 ENCOUNTER — Ambulatory Visit (INDEPENDENT_AMBULATORY_CARE_PROVIDER_SITE_OTHER): Payer: Medicare Other | Admitting: Internal Medicine

## 2013-09-28 ENCOUNTER — Encounter: Payer: Self-pay | Admitting: Internal Medicine

## 2013-09-28 VITALS — BP 132/70 | HR 111 | Temp 98.9°F | Wt 115.0 lb

## 2013-09-28 DIAGNOSIS — G894 Chronic pain syndrome: Secondary | ICD-10-CM

## 2013-09-28 DIAGNOSIS — I10 Essential (primary) hypertension: Secondary | ICD-10-CM

## 2013-09-28 MED ORDER — OXYCODONE HCL 15 MG PO TABS: 15.0000 mg | ORAL_TABLET | ORAL | Status: DC | PRN

## 2013-09-28 NOTE — Assessment & Plan Note (Signed)
Multifactorial - chronic issue Chronic related to LBP, hips and scoli with many prior/remote surgeries exacerbated spring/summer 2013 with recurrent and bilateral pneumothorax requiring surgical interventions/chest tube Changed to MSContin 09/2011 hosp (prev duragesic patch) Stopped oxy 08/2011 for breakthru pain, but resumed same 08/2013 prn given increasing BLE pain/weakness No breakthru relief with tramadol reviewed hx sz event (while tapering off narcotics fall 2011) continue refills per pain mgmt contract  - 09/2012 last "violation" lost 8 pills reviewed -pt understands and agrees another violation will prompt dismissal from this practice

## 2013-09-28 NOTE — Assessment & Plan Note (Signed)
The current medical regimen is effective;  continue present plan and medications.  BP Readings from Last 3 Encounters:  09/28/13 132/70  08/24/13 128/84  07/29/13 120/82
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
## 2013-09-28 NOTE — Patient Instructions (Signed)
It was good to see you today.  We have reviewed your prior records including labs and tests today  Medications reviewed and updated - ok to continue OxyIR for breakthrough as needed - I will not refill this monthly or increase amount/dose  Please schedule followup in 3-4 months, call sooner if problems.

## 2013-09-28 NOTE — Progress Notes (Signed)
Pre visit review using our clinic review tool, if applicable. No additional management support is needed unless otherwise documented below in the visit note. 

## 2013-10-18 ENCOUNTER — Other Ambulatory Visit: Payer: Self-pay | Admitting: Internal Medicine

## 2013-10-20 ENCOUNTER — Other Ambulatory Visit: Payer: Self-pay | Admitting: Internal Medicine

## 2013-10-20 NOTE — Telephone Encounter (Signed)
Ok to print and i will sign for pick up

## 2013-10-20 NOTE — Telephone Encounter (Signed)
Patient left message that she needs a prescription for her MS Contin.  She will need it before the weekend is over.  Please call when it is ready and her fiance will pick up.

## 2013-10-21 MED ORDER — MORPHINE SULFATE ER 100 MG PO TBCR: 100.0000 mg | EXTENDED_RELEASE_TABLET | Freq: Two times a day (BID) | ORAL | Status: DC

## 2013-10-21 NOTE — Telephone Encounter (Signed)
Printed md sign notified pt rx ready for pick-up...Raechel Chute/lmb

## 2013-10-27 ENCOUNTER — Other Ambulatory Visit: Payer: Self-pay | Admitting: Internal Medicine

## 2013-10-31 ENCOUNTER — Other Ambulatory Visit: Payer: Self-pay | Admitting: *Deleted

## 2013-10-31 MED ORDER — OXYCODONE HCL 15 MG PO TABS: 15.0000 mg | ORAL_TABLET | ORAL | Status: DC | PRN

## 2013-10-31 NOTE — Telephone Encounter (Signed)
Left msg on triage requesting refill on her oxycodone.../lmb 

## 2013-10-31 NOTE — Telephone Encounter (Signed)
Notified pt rx ready for pick-up.../lmb 

## 2013-11-14 ENCOUNTER — Telehealth: Payer: Self-pay

## 2013-11-14 ENCOUNTER — Other Ambulatory Visit: Payer: Self-pay

## 2013-11-14 ENCOUNTER — Telehealth: Payer: Self-pay | Admitting: Internal Medicine

## 2013-11-14 MED ORDER — OXYBUTYNIN CHLORIDE ER 10 MG PO TB24: ORAL_TABLET | ORAL | Status: DC

## 2013-11-14 MED ORDER — AMLODIPINE BESYLATE 5 MG PO TABS: 5.0000 mg | ORAL_TABLET | Freq: Every day | ORAL | Status: DC

## 2013-11-14 MED ORDER — LEVETIRACETAM ER 500 MG PO TB24: 500.0000 mg | ORAL_TABLET | Freq: Every day | ORAL | Status: AC

## 2013-11-14 MED ORDER — MORPHINE SULFATE ER 100 MG PO TBCR: 100.0000 mg | EXTENDED_RELEASE_TABLET | Freq: Two times a day (BID) | ORAL | Status: DC

## 2013-11-14 MED ORDER — OXYCODONE HCL 15 MG PO TABS: 15.0000 mg | ORAL_TABLET | ORAL | Status: DC | PRN

## 2013-11-14 NOTE — Telephone Encounter (Signed)
Patient is going on a 3 week vacation.  She will be leaving today or tomorrow.  She would like a month script for dixropan, norvasc, ms contin, keppra and oxycodone.  Patient has decided to try to wait in lobby.  Please Advise.  Thanks!

## 2013-11-14 NOTE — Telephone Encounter (Signed)
Ditropan, keppra, norvasc - erx done Others printed and signed

## 2013-11-14 NOTE — Telephone Encounter (Signed)
erronours encounter

## 2013-12-14 ENCOUNTER — Other Ambulatory Visit: Payer: Self-pay

## 2013-12-14 ENCOUNTER — Telehealth: Payer: Self-pay | Admitting: Internal Medicine

## 2013-12-14 MED ORDER — MORPHINE SULFATE ER 100 MG PO TBCR: 100.0000 mg | EXTENDED_RELEASE_TABLET | Freq: Two times a day (BID) | ORAL | Status: DC

## 2013-12-14 MED ORDER — OXYCODONE HCL 15 MG PO TABS: 15.0000 mg | ORAL_TABLET | ORAL | Status: DC | PRN

## 2013-12-14 NOTE — Telephone Encounter (Signed)
Patient states it will be time to refill oxycodone and ms contin but patient has a funeral to attend tomorrow.  She is requesting to pick the scripts up today or tomorrow.

## 2013-12-15 NOTE — Telephone Encounter (Signed)
Pt called to check up this request, pt was wondering if sh can come pick this up today? Please advise.

## 2013-12-15 NOTE — Telephone Encounter (Signed)
rx ready for pick up here at the office

## 2013-12-24 ENCOUNTER — Other Ambulatory Visit: Payer: Self-pay | Admitting: Internal Medicine

## 2014-01-10 ENCOUNTER — Other Ambulatory Visit: Payer: Self-pay | Admitting: *Deleted

## 2014-01-10 MED ORDER — MORPHINE SULFATE ER 100 MG PO TBCR: 100.0000 mg | EXTENDED_RELEASE_TABLET | Freq: Two times a day (BID) | ORAL | Status: DC

## 2014-01-10 MED ORDER — OXYCODONE HCL 15 MG PO TABS: 15.0000 mg | ORAL_TABLET | ORAL | Status: DC | PRN

## 2014-01-10 NOTE — Telephone Encounter (Signed)
Requesting refill on her MS contin & Percocet...Rhonda Ramos

## 2014-01-10 NOTE — Telephone Encounter (Signed)
Notified pt rx ready for pick-up.../lmb 

## 2014-01-22 IMAGING — CR DG CHEST 1V PORT
1 series · 1 of 1 positions shown · non-contrast
Comparison: 07/03/2011

CLINICAL DATA: Shortness of breath

PORTABLE CHEST - 1 VIEW

[AP]
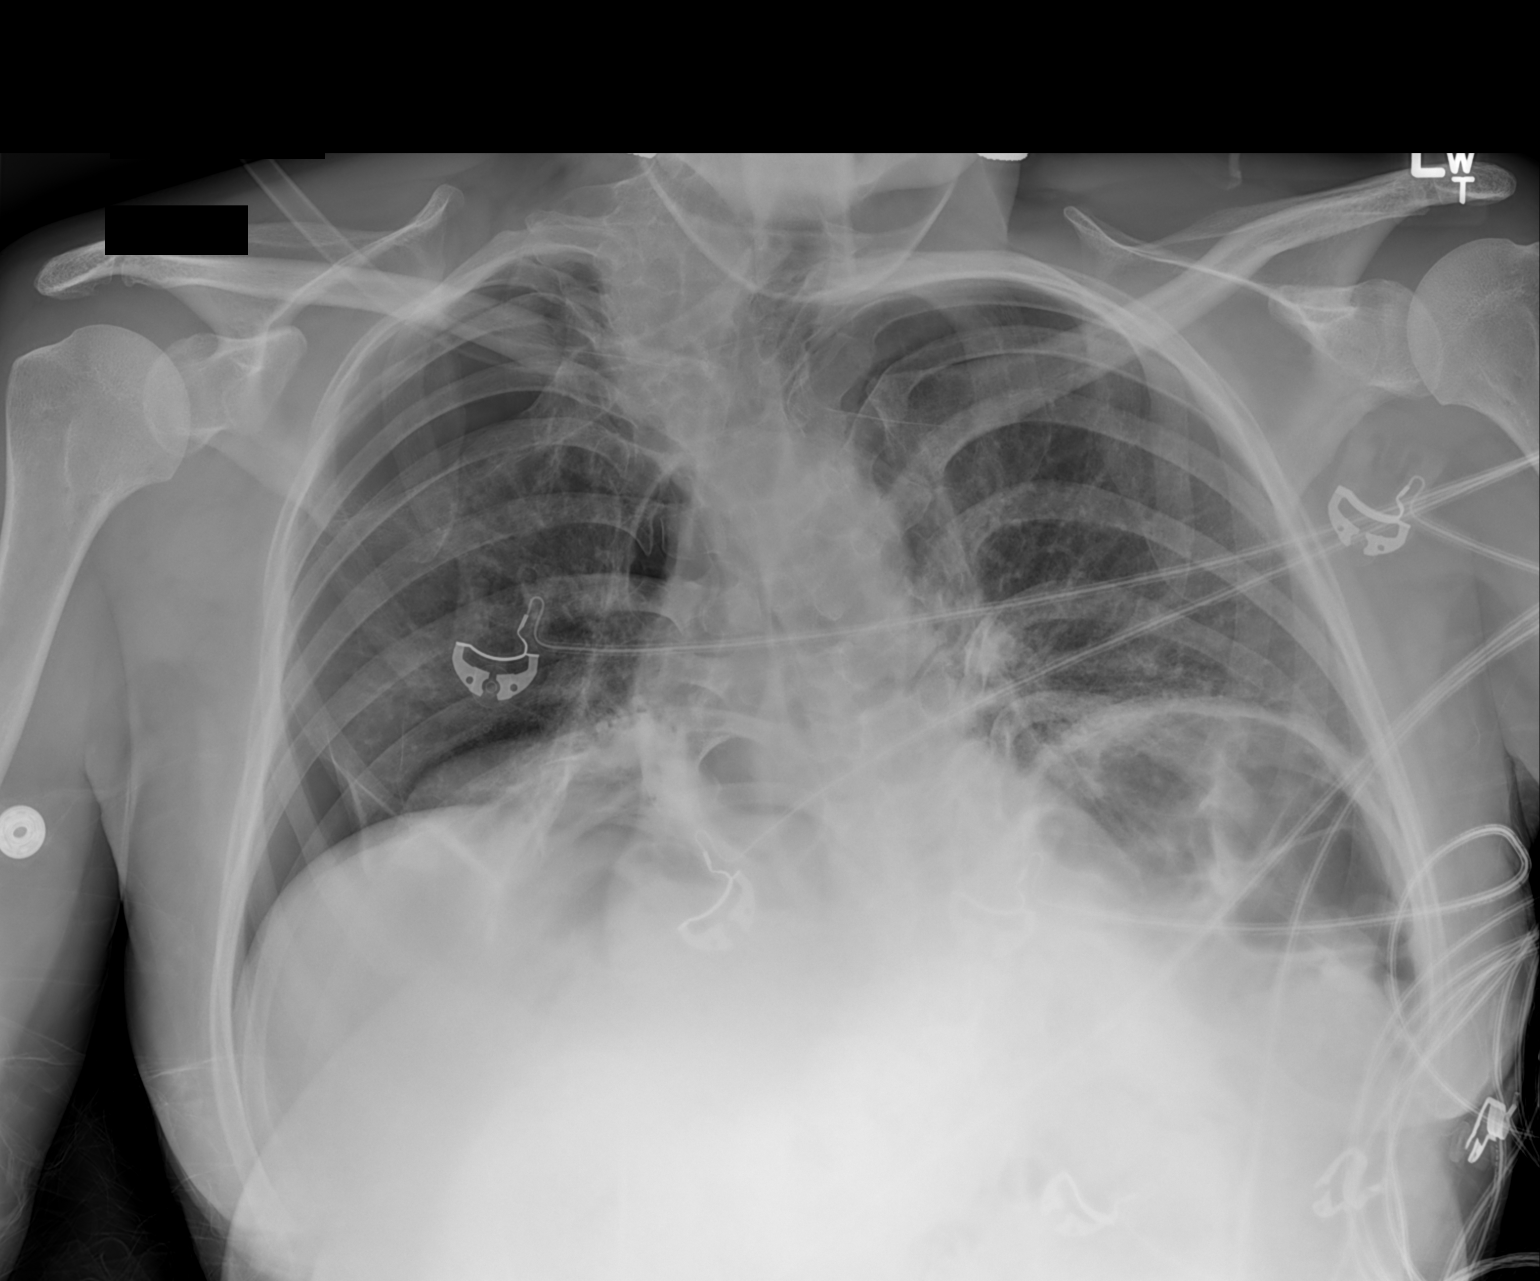

[1 of 1 positions shown; findings below may reference images not displayed]

FINDINGS: Cardiac size is stable.  There is at least 30 to 40%
right upper, right lower lateral and right medial pneumothorax with
partial collapse of the right middle lobe, upper lobe and right
lower lobe.  Mild left basilar atelectasis.  Levoscoliosis
thoracolumbar spine again noted.
IMPRESSION: There is at least 30 to 40% right upper, right lower lateral and
right medial pneumothorax with partial collapse of the right middle
lobe and right lower lobe.  Mild left basilar atelectasis.

Critical findings discussed immediately with Dr. Alizoti from emergency
room.

## 2014-01-22 IMAGING — CR DG CHEST 1V PORT
1 series · 1 of 1 positions shown · non-contrast
Comparison: Earlier film, same date.

CLINICAL DATA: Chest tube placement.

PORTABLE CHEST - 1 VIEW

[AP]
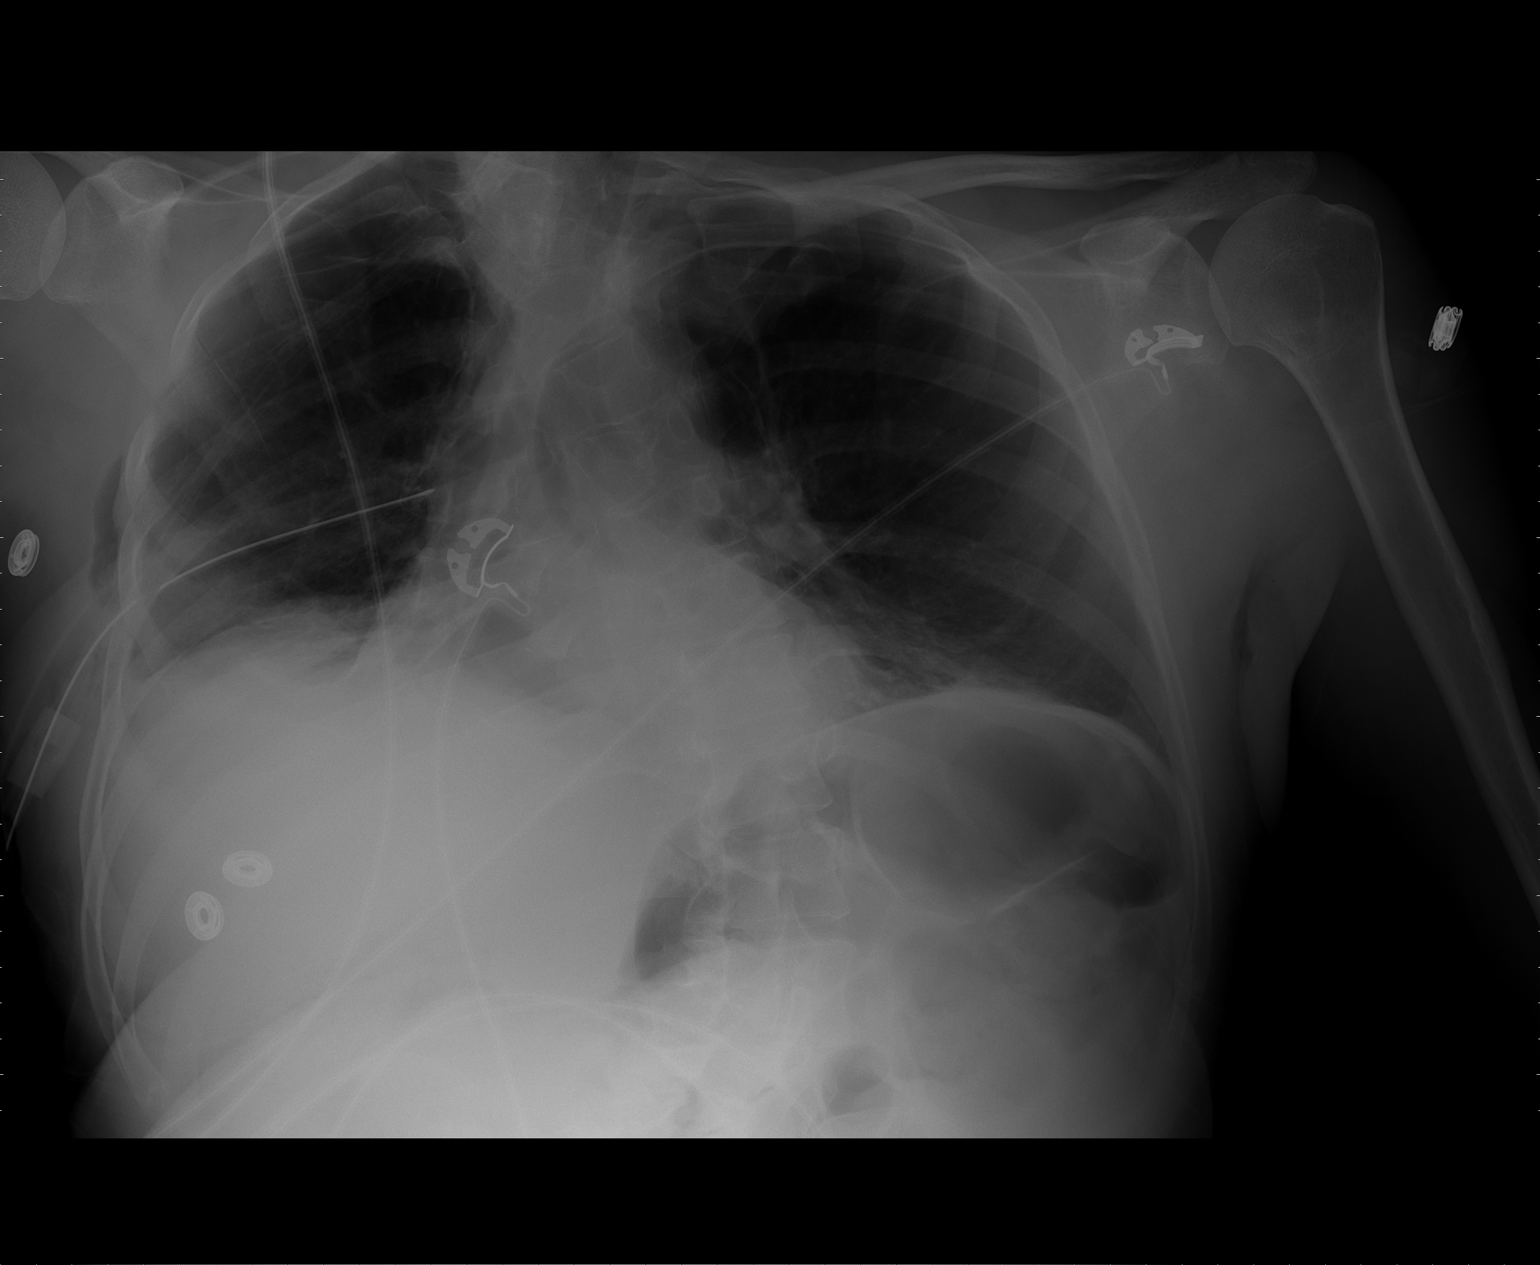

[1 of 1 positions shown; findings below may reference images not displayed]

FINDINGS: The right-sided chest tube is in good position.
Significant interval decrease in size of the right-sided
pneumothorax.  It is estimated at 15-20%. Bibasilar atelectasis is
noted.
IMPRESSION: New right-sided chest tube with interval decrease in size of the
right-sided pneumothorax (now estimated at 15-20%).

## 2014-01-24 IMAGING — CR DG CHEST 1V PORT
1 series · 1 of 1 positions shown · non-contrast
Comparison: Chest x-ray 08/07/2011.

CLINICAL DATA: Follow up pneumothorax.

PORTABLE CHEST - 1 VIEW

[AP]
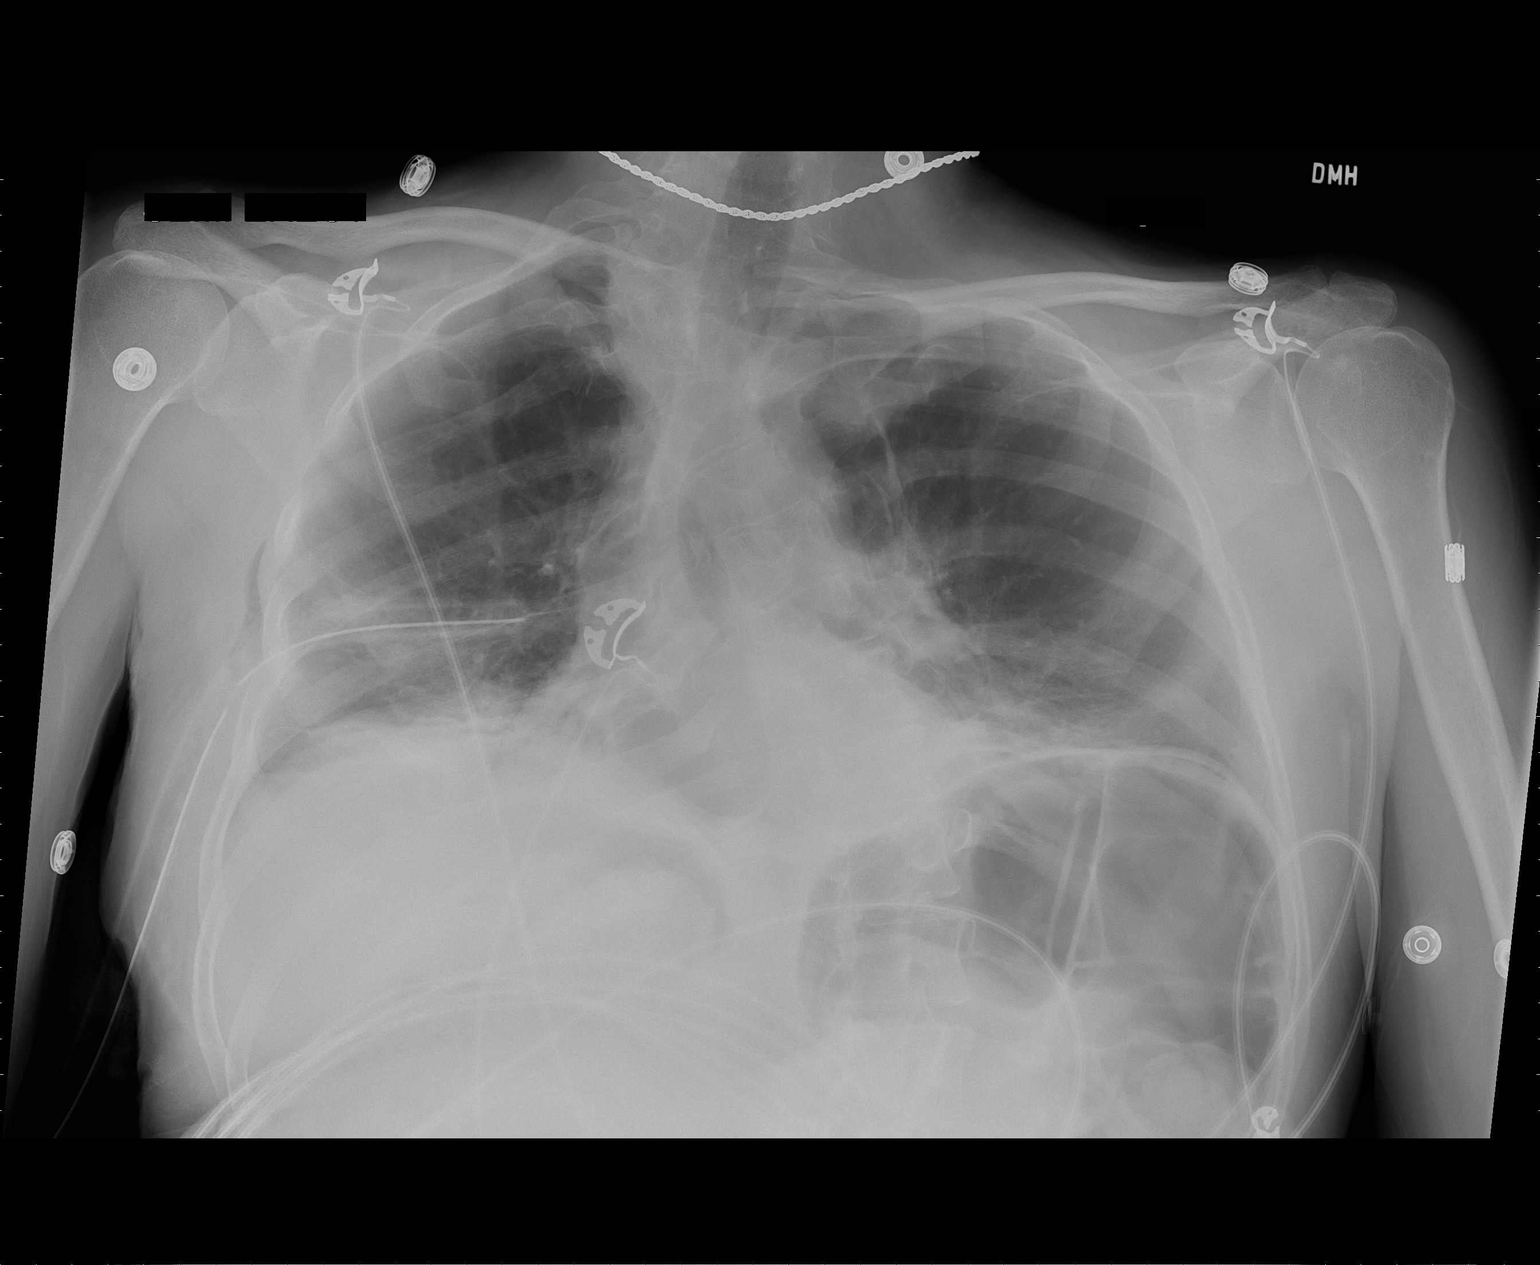

[1 of 1 positions shown; findings below may reference images not displayed]

FINDINGS: A right-sided chest tube is in position with tip in the
mid right hemithorax and side port slightly exterior to the bony
thorax at this time (this has been slightly retracted).  Lung
volumes remain low.  There continue to be extensive bibasilar
opacities, favored to represent areas of subsegmental atelectasis.
Small residual right apical pneumothorax is unchanged.  There is a
small amount of gas tracking in the subcutaneous tissues of the
right chest wall. No significant pleural effusions.  Heart size is
within normal limits.  Mediastinal contours are distorted by
patient positioning.
IMPRESSION: 1.  Unchanged small right apical pneumothorax.
2.  Compared to the prior examination the patient's chest tube has
been slightly retracted, and the sideport is now exterior to the
bony thorax.

These results will be called to the ordering clinician or
representative by the Radiologist Assistant, and communication
documented in the PACS Dashboard.

## 2014-01-25 IMAGING — CR DG CHEST 1V PORT
1 series · 1 of 1 positions shown · non-contrast
Comparison: 08/08/2011

CLINICAL DATA: Follow up right-sided pneumothorax

PORTABLE CHEST - 1 VIEW

[AP]
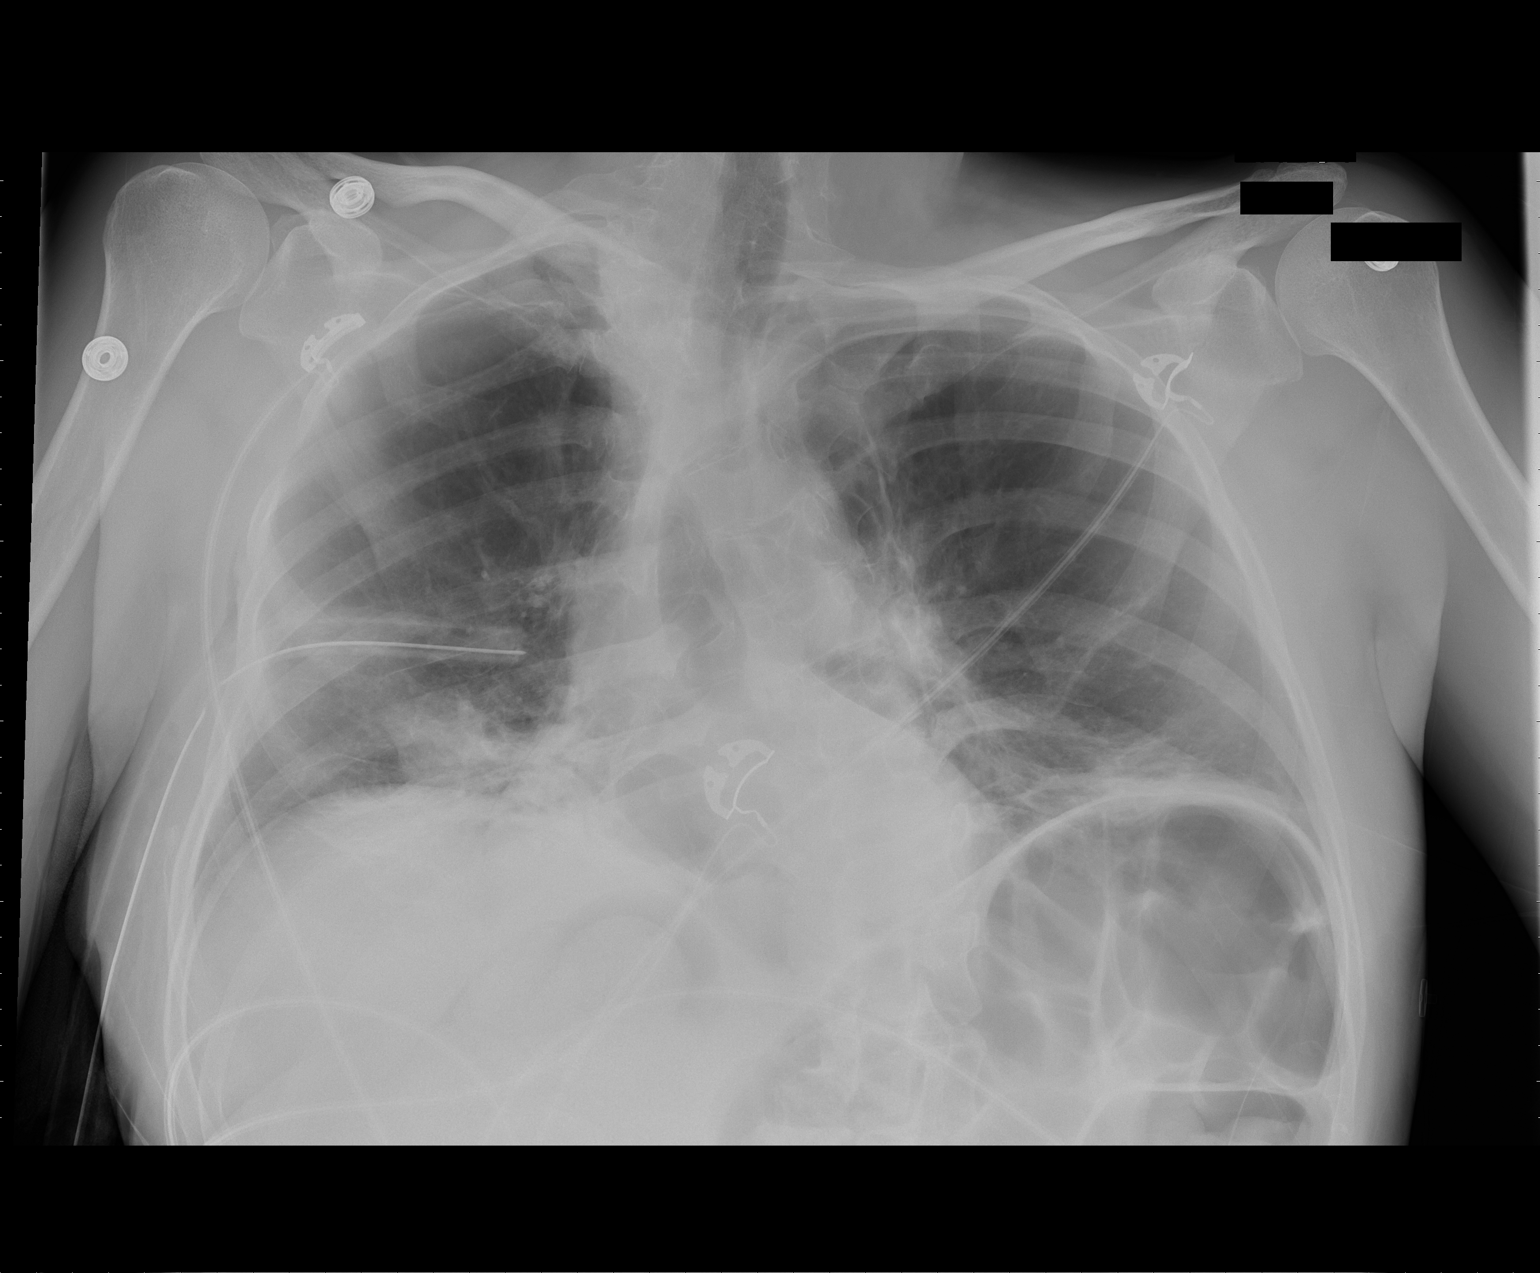

[1 of 1 positions shown; findings below may reference images not displayed]

FINDINGS: There is a right-sided chest tube.

The side port appears to be just external the costal margin.

No diagnostic pneumothorax is identified.

The heart size appears normal.

The lung volumes are low and there is atelectasis within both lung
bases.
IMPRESSION: 1.  Low lung volumes and bibasilar atelectasis.
2.  Stable position of right chest tube with side port external to
the costal margin.  No significant pneumothorax identified at this
time.

## 2014-01-26 IMAGING — CR DG CHEST 1V PORT
1 series · 1 of 1 positions shown · non-contrast
Comparison: 08/09/2011

CLINICAL DATA: Evaluate right pneumothorax

PORTABLE CHEST - 1 VIEW

[AP]
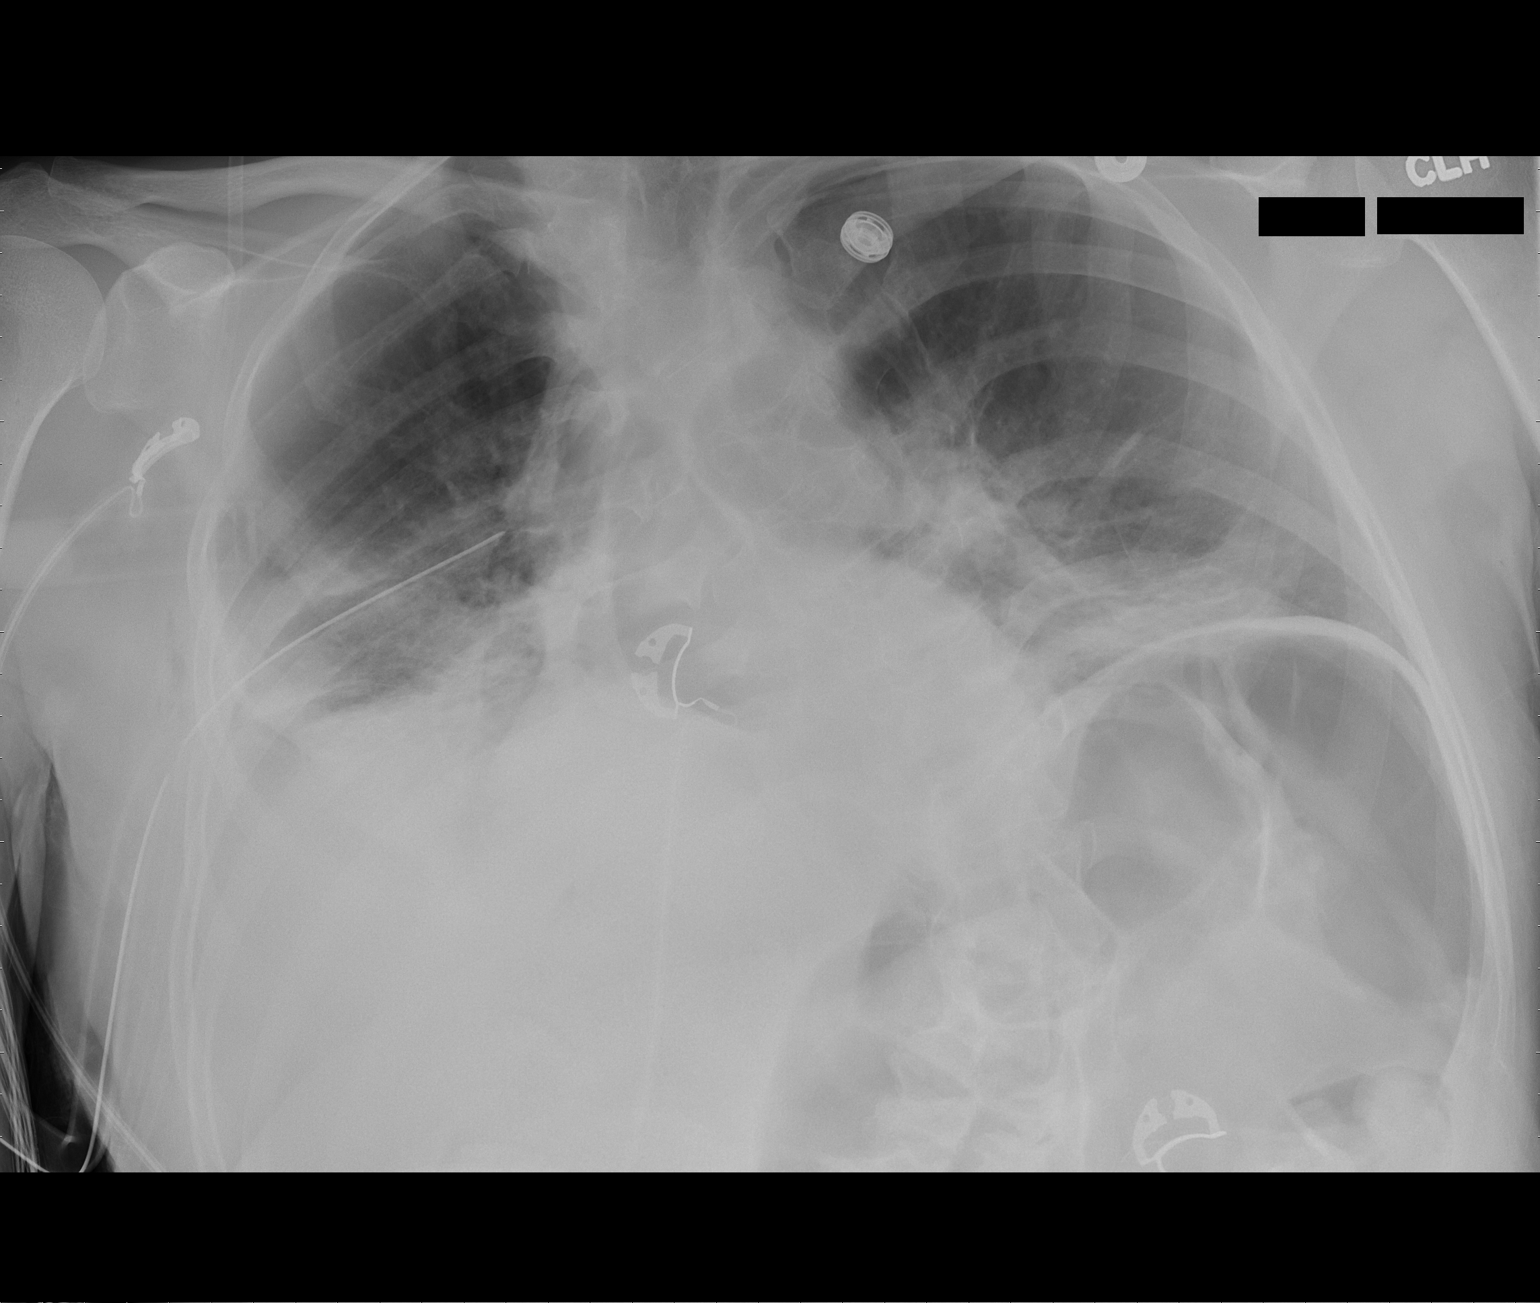

[1 of 1 positions shown; findings below may reference images not displayed]

FINDINGS: There is a right-sided chest tube in place.

No pneumothorax identified.

The heart size appears normal.

There are low lung volumes and bibasilar atelectasis, unchanged
from previous exam.
IMPRESSION: 1.  Low lung volumes and bibasilar atelectasis.
2.  Right chest tube in place without diagnostic pneumothorax.

## 2014-02-02 ENCOUNTER — Other Ambulatory Visit: Payer: Self-pay | Admitting: Internal Medicine

## 2014-02-02 MED ORDER — OXYCODONE HCL 15 MG PO TABS: 15.0000 mg | ORAL_TABLET | ORAL | Status: DC | PRN

## 2014-02-02 MED ORDER — MORPHINE SULFATE ER 100 MG PO TBCR: 100.0000 mg | EXTENDED_RELEASE_TABLET | Freq: Two times a day (BID) | ORAL | Status: DC

## 2014-02-02 NOTE — Telephone Encounter (Signed)
Notified pt rx ready for pick-up.../lmb 

## 2014-02-02 NOTE — Telephone Encounter (Signed)
Pt called in Requesting refill on her MS contin & Percocet..Marland Kitchen

## 2014-02-03 ENCOUNTER — Telehealth: Payer: Self-pay | Admitting: *Deleted

## 2014-02-03 MED ORDER — MORPHINE SULFATE ER 100 MG PO TBCR: 100.0000 mg | EXTENDED_RELEASE_TABLET | Freq: Two times a day (BID) | ORAL | Status: DC

## 2014-02-03 MED ORDER — OXYCODONE HCL 15 MG PO TABS: 15.0000 mg | ORAL_TABLET | ORAL | Status: DC | PRN

## 2014-02-03 NOTE — Telephone Encounter (Signed)
Pt husband is here to pick up refills on pain medication. Dr. Felicity CoyerLeschber approved yesterday fro her oxycodone & oxycontin. Rx's are not in cabinet. MD is out is it ok to re-print and have another md to sign. They are still waiting...Raechel Chute/lmb

## 2014-02-03 NOTE — Telephone Encounter (Signed)
Original rx's was found behind shelf those was voided. Gave rx's to pt husband...lmb

## 2014-02-03 NOTE — Telephone Encounter (Signed)
rx re-done

## 2014-02-06 ENCOUNTER — Telehealth: Payer: Self-pay | Admitting: Internal Medicine

## 2014-02-06 NOTE — Telephone Encounter (Signed)
Per chart [facility] verified if they received fax she stated no. Gave md authorization on her alprazolam. Notified pt rx call into pharmacy...Raechel Chute/lmb

## 2014-02-06 NOTE — Telephone Encounter (Signed)
Pt called in and requested her refill on Xanx to be sent to the pharmacy.  She said that she is has been without it and can not go without it much longer.

## 2014-03-03 ENCOUNTER — Telehealth: Payer: Self-pay | Admitting: Internal Medicine

## 2014-03-03 NOTE — Telephone Encounter (Signed)
Pt requesting refills of her pain medications, she said she usually calls then picks up her prescriptions.

## 2014-03-03 NOTE — Telephone Encounter (Signed)
Not due until 11/2 will hold until md return on Monday to fill...Raechel Chute/lmb

## 2014-03-04 ENCOUNTER — Other Ambulatory Visit: Payer: Self-pay

## 2014-03-04 MED ORDER — OXYCODONE HCL 15 MG PO TABS: 15.0000 mg | ORAL_TABLET | ORAL | Status: DC | PRN

## 2014-03-04 NOTE — Telephone Encounter (Signed)
Printed for md to sign  

## 2014-03-06 ENCOUNTER — Other Ambulatory Visit: Payer: Self-pay

## 2014-03-06 ENCOUNTER — Telehealth: Payer: Self-pay

## 2014-03-06 MED ORDER — MORPHINE SULFATE ER 100 MG PO TBCR: 100.0000 mg | EXTENDED_RELEASE_TABLET | Freq: Two times a day (BID) | ORAL | Status: DC

## 2014-03-06 NOTE — Telephone Encounter (Signed)
Pt informed rx is ready for pick up here at the office.

## 2014-03-12 ENCOUNTER — Other Ambulatory Visit: Payer: Self-pay | Admitting: Internal Medicine

## 2014-03-25 ENCOUNTER — Ambulatory Visit (HOSPITAL_BASED_OUTPATIENT_CLINIC_OR_DEPARTMENT_OTHER)
Admission: RE | Admit: 2014-03-25 | Discharge: 2014-03-25 | Disposition: A | Discharge status: Home / Self Care | Payer: Medicare Other | Source: Ambulatory Visit | Attending: Family Medicine | Admitting: Family Medicine

## 2014-03-25 ENCOUNTER — Encounter: Payer: Self-pay | Admitting: Family Medicine

## 2014-03-25 ENCOUNTER — Ambulatory Visit (INDEPENDENT_AMBULATORY_CARE_PROVIDER_SITE_OTHER): Payer: Medicare Other | Admitting: Family Medicine

## 2014-03-25 ENCOUNTER — Other Ambulatory Visit: Payer: Self-pay | Admitting: Family Medicine

## 2014-03-25 ENCOUNTER — Emergency Department (HOSPITAL_BASED_OUTPATIENT_CLINIC_OR_DEPARTMENT_OTHER)
Admission: EM | Admit: 2014-03-25 | Discharge: 2014-03-25 | Discharge status: Absent without leave | Payer: Medicare Other

## 2014-03-25 VITALS — BP 150/82 | HR 106 | Temp 99.2°F | Wt 120.0 lb

## 2014-03-25 DIAGNOSIS — R0789 Other chest pain: Secondary | ICD-10-CM

## 2014-03-25 DIAGNOSIS — R079 Chest pain, unspecified: Secondary | ICD-10-CM | POA: Diagnosis not present

## 2014-03-25 DIAGNOSIS — J40 Bronchitis, not specified as acute or chronic: Secondary | ICD-10-CM

## 2014-03-25 DIAGNOSIS — R05 Cough: Secondary | ICD-10-CM | POA: Diagnosis present

## 2014-03-25 MED ORDER — AMOXICILLIN-POT CLAVULANATE 875-125 MG PO TABS
1.0000 | ORAL_TABLET | Freq: Two times a day (BID) | ORAL | Status: DC
Start: 2014-03-25 — End: 2014-08-07

## 2014-03-25 MED ORDER — MUCINEX DM 30-600 MG PO TB12: ORAL_TABLET | ORAL | Status: DC
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
## 2014-03-27 ENCOUNTER — Telehealth: Payer: Self-pay

## 2014-03-27 NOTE — Telephone Encounter (Signed)
[facility] Fax: [phone] [facility]  Date/ Time:03/25/2014 12:35 PM Taken UJ:[id number]:Brooke Elvina SidleKeene, RN Caller: Darius [facility] Patient: Rhonda Ramos, Rhonda Ramos DOB: 11/04/1965 Phone: [phone] Reason for Call: Darius is calling with results for Chest X-ray ordered by Dr. Laury AxonLowne. The results are Stable chronic bibasilar scarring. No acute cardiopulmonary disease. and were read by Charlett NoseKevin Dover, MD.

## 2014-04-06 ENCOUNTER — Encounter: Payer: Self-pay | Admitting: Family

## 2014-04-06 ENCOUNTER — Ambulatory Visit (INDEPENDENT_AMBULATORY_CARE_PROVIDER_SITE_OTHER): Payer: Medicare Other | Admitting: Family

## 2014-04-06 VITALS — BP 148/80 | HR 129 | Temp 98.5°F | Resp 18 | Ht <= 58 in | Wt 120.0 lb

## 2014-04-06 DIAGNOSIS — Q059 Spina bifida, unspecified: Secondary | ICD-10-CM

## 2014-04-06 DIAGNOSIS — G894 Chronic pain syndrome: Secondary | ICD-10-CM

## 2014-04-06 MED ORDER — MORPHINE SULFATE ER 100 MG PO TBCR: 100.0000 mg | EXTENDED_RELEASE_TABLET | Freq: Two times a day (BID) | ORAL | Status: DC

## 2014-04-06 MED ORDER — OXYCODONE HCL 15 MG PO TABS: 15.0000 mg | ORAL_TABLET | ORAL | Status: DC | PRN

## 2014-04-06 MED ORDER — DIAZEPAM 5 MG PO TABS: 5.0000 mg | ORAL_TABLET | Freq: Two times a day (BID) | ORAL | Status: DC | PRN

## 2014-04-06 NOTE — Progress Notes (Signed)
Pre visit review using our clinic review tool, if applicable. No additional management support is needed unless otherwise documented below in the visit note. 

## 2014-04-06 NOTE — Progress Notes (Signed)
Subjective:    Patient ID: Rhonda Ramos, female    DOB: 07/21/1965, 48 y.o.   MRN: [id number]  Chief Complaint  Patient presents with  . Medication Refill    wants valium instead of xanax, also requesting refill of oxycodone and morphine    HPI:  Rhonda Ramos is a 48 y.o. female who presents today for pain medication management.  History of spina bifida and scoliosis and COPD and has constant pain. Currently is maintained on morphine 100 mg q 12 hours and oxycodone 15 mg.  Indicates that during the summer the oxycodone relieves the pain between morphine and now in the winter the pain is worse and is requesting a potential increase in the oxycodone. Also, she is requesting to change the Xanax to valium. Denies constipation with current regimen.  Allergies  Allergen Reactions  . Latex Shortness Of Breath  . Codeine Nausea And Vomiting  . Erythromycin Nausea And Vomiting  . Sulfa Antibiotics Nausea And Vomiting   Current Outpatient Prescriptions on File Prior to Visit  Medication Sig Dispense Refill  . albuterol (PROVENTIL HFA;VENTOLIN HFA) 108 (90 BASE) MCG/ACT inhaler Inhale 2 puffs into the lungs every 6 (six) hours as needed for wheezing. 18 g 1  . ALPRAZolam (XANAX) 0.5 MG tablet take 1 tablet by mouth twice a day if needed for sleep and anxiety 60 tablet 5  . amLODipine (NORVASC) 5 MG tablet Take 1 tablet (5 mg total) by mouth daily. 30 tablet 0  . amoxicillin-clavulanate (AUGMENTIN) 875-125 MG per tablet Take 1 tablet by mouth 2 (two) times daily. 20 tablet 0  . cyclobenzaprine (FLEXERIL) 10 MG tablet Take 1 tablet (10 mg total) by mouth 3 (three) times daily as needed for muscle spasms. 60 tablet 3  . Dextromethorphan-Guaifenesin (MUCINEX DM) 30-600 MG TB12 1 po bid prn cough 28 each 0  . FLUoxetine (PROZAC) 20 MG capsule take 3 capsules by mouth once daily 90 capsule 5  . [name] 0.35 MG tablet take 1 tablet by mouth once daily 28 tablet 0  . hydrOXYzine (VISTARIL) 50 MG  capsule take 1 capsule by mouth every 8 hours if needed for itching 90 capsule 1  . levETIRAcetam (KEPPRA XR) 500 MG 24 hr tablet Take 1 tablet (500 mg total) by mouth daily. 90 tablet 1  . morphine (MS CONTIN) 100 MG 12 hr tablet Take 1 tablet (100 mg total) by mouth 2 (two) times daily. Do not fill more than once every 30 days 60 tablet 0  . Multiple Vitamin (MULITIVITAMIN WITH MINERALS) TABS Take 1 tablet by mouth daily.    Marland Kitchen. omeprazole (PRILOSEC) 20 MG capsule take 1 capsule by mouth once daily 30 capsule 5  . oxybutynin (DITROPAN-XL) 10 MG 24 hr tablet take 1 tablet by mouth once daily 30 tablet 5  . oxyCODONE (ROXICODONE) 15 MG immediate release tablet Take 1 tablet (15 mg total) by mouth every 4 (four) hours as needed for pain. 30 tablet 0  . triamcinolone cream (KENALOG) 0.1 % Apply topically 2 (two) times daily. 454 g 0  . [DISCONTINUED] potassium chloride (K-DUR) 10 MEQ tablet Take 1 tablet (10 mEq total) by mouth 2 (two) times daily. 10 tablet 1   No current facility-administered medications on file prior to visit.    Review of Systems    See HPI  Objective:    BP 148/80 mmHg  Pulse 129  Temp(Src) 98.5 F (36.9 C) (Oral)  Resp 18  Ht 4\' 8"  (1.422  m)  Wt 120 lb (54.432 kg)  BMI 26.92 kg/m2  SpO2 93% Nursing note and vital signs reviewed.  Physical Exam  Constitutional: She is oriented to person, place, and time. She appears well-developed and well-nourished. No distress.  Female seated in a chair is dressed appropriately, appears older than than her stated age. A walker is placed in front of her.  Cardiovascular: Normal rate, regular rhythm, normal heart sounds and intact distal pulses.   Pulmonary/Chest: Effort normal and breath sounds normal.  Neurological: She is alert and oriented to person, place, and time.  Skin: Skin is warm and dry.  Psychiatric: She has a normal mood and affect. Her behavior is normal. Judgment and thought content normal.       Assessment &  Plan:

## 2014-04-06 NOTE — Assessment & Plan Note (Signed)
Continues to have increasing amounts of pain related to spina bifida and scoliosis. Currently maintained on morphine and oxycodone. Medications refill the patient's request. Requesting increase in oxycodone, declined at this time. Will give an extra doses of the oxycodone as needed for pain. Also changed Xanax for her Valium at patient request. Start Valium 5 mg as needed for sleep. Follow up in one month with PCP.

## 2014-04-06 NOTE — Patient Instructions (Signed)
Thank you for choosing [facility].  Summary/Instructions:  Your prescription(s) have been submitted to your pharmacy. Please take as directed and contact our office if you believe you are having problem(s) with the medication(s).  If your symptoms worsen or fail to improve, please contact our office for further instruction, or in case of emergency go directly to the emergency room at the closest medical facility.      

## 2014-04-06 NOTE — Assessment & Plan Note (Deleted)
Continues to have increasing amounts of pain. Currently maintained on morphine and oxycodone. Medications refill the patient's request. Requesting increase in oxycodone, declined at this time. Will give an extra doses of the oxycodone as needed for pain. Also changed Xanax for her Valium at patient request. Start Valium 5 mg as needed for sleep. Follow up in one month with PCP.

## 2014-05-04 ENCOUNTER — Ambulatory Visit (INDEPENDENT_AMBULATORY_CARE_PROVIDER_SITE_OTHER): Payer: Medicare Other | Admitting: Family

## 2014-05-04 ENCOUNTER — Encounter: Payer: Self-pay | Admitting: Family

## 2014-05-04 VITALS — BP 170/92 | HR 107 | Temp 98.5°F | Resp 20 | Wt 121.6 lb

## 2014-05-04 DIAGNOSIS — Z7409 Other reduced mobility: Secondary | ICD-10-CM | POA: Insufficient documentation

## 2014-05-04 DIAGNOSIS — G894 Chronic pain syndrome: Secondary | ICD-10-CM

## 2014-05-04 MED ORDER — OXYCODONE HCL 15 MG PO TABS: 15.0000 mg | ORAL_TABLET | ORAL | Status: DC | PRN

## 2014-05-04 MED ORDER — DIAZEPAM 5 MG PO TABS: 5.0000 mg | ORAL_TABLET | Freq: Two times a day (BID) | ORAL | Status: DC | PRN

## 2014-05-04 MED ORDER — MORPHINE SULFATE ER 100 MG PO TBCR: 100.0000 mg | EXTENDED_RELEASE_TABLET | Freq: Two times a day (BID) | ORAL | Status: DC

## 2014-05-04 NOTE — Assessment & Plan Note (Signed)
Patient continues to display increased impaired mobility. She uses a motorized wheelchair at home and is requesting a nonmotorized wheelchair for use outside of her home complex. Given good upper body strength and inability to maintain oxygen saturation with exertion and increased pain levels recommend a nonmotorized wheelchair for use when away from home. A cane or walker is not appropriate at this time secondary to decreased stability when walking and increased pain when standing.

## 2014-05-04 NOTE — Progress Notes (Signed)
Subjective:    Patient ID: Rhonda BostonLori A Lehtinen, female    DOB: 03/26/1966, 48 y.o.   MRN: [id number]  Chief Complaint  Patient presents with  . Medication Refill    Would like to discuss wheelchair, says its getting hard to walk around with a walker, need refills of all pain medications    HPI:  Rhonda Ramos is a 48 y.o. female who presents today for medication refill and to discuss a wheelchair.   1) Wheelchair - Indicates that she has a motorized wheelchair that she uses in her house. Patient explains difficulties using motorized when leaving the house and transporting it when she leaves her complex. Indicates she would like to try to obtain a regular non-motorized wheelchair to use while she is away from home. Currently uses a walker, however she becomes fatigued and has increased levels of pain that she rates 10/10 at worst and brings her to tears when having to stand for long periods of time. States she notes pain and fatigue after walking about 50 yards and needs to rest. States her upper body strength is enough to propel the wheelchair and has her husband to help push her wheelchair when she goes out.   2) Medication refill - chronic pain and is maintained on MS Contin, oxycodone, and Valium. Patient indicates recent change from Xanax to Valium has helped significantly in decreasing her fatigue and drowsiness. Requesting refills of these medications.  Allergies  Allergen Reactions  . Latex Shortness Of Breath  . Codeine Nausea And Vomiting  . Erythromycin Nausea And Vomiting  . Sulfa Antibiotics Nausea And Vomiting    Current Outpatient Prescriptions on File Prior to Visit  Medication Sig Dispense Refill  . albuterol (PROVENTIL HFA;VENTOLIN HFA) 108 (90 BASE) MCG/ACT inhaler Inhale 2 puffs into the lungs every 6 (six) hours as needed for wheezing. 18 g 1  . amLODipine (NORVASC) 5 MG tablet Take 1 tablet (5 mg total) by mouth daily. 30 tablet 0  . amoxicillin-clavulanate (AUGMENTIN)  875-125 MG per tablet Take 1 tablet by mouth 2 (two) times daily. 20 tablet 0  . cyclobenzaprine (FLEXERIL) 10 MG tablet Take 1 tablet (10 mg total) by mouth 3 (three) times daily as needed for muscle spasms. 60 tablet 3  . Dextromethorphan-Guaifenesin (MUCINEX DM) 30-600 MG TB12 1 po bid prn cough 28 each 0  . FLUoxetine (PROZAC) 20 MG capsule take 3 capsules by mouth once daily 90 capsule 5  . [name] 0.35 MG tablet take 1 tablet by mouth once daily 28 tablet 0  . hydrOXYzine (VISTARIL) 50 MG capsule take 1 capsule by mouth every 8 hours if needed for itching 90 capsule 1  . levETIRAcetam (KEPPRA XR) 500 MG 24 hr tablet Take 1 tablet (500 mg total) by mouth daily. 90 tablet 1  . Multiple Vitamin (MULITIVITAMIN WITH MINERALS) TABS Take 1 tablet by mouth daily.    Marland Kitchen. omeprazole (PRILOSEC) 20 MG capsule take 1 capsule by mouth once daily 30 capsule 5  . oxybutynin (DITROPAN-XL) 10 MG 24 hr tablet take 1 tablet by mouth once daily 30 tablet 5  . triamcinolone cream (KENALOG) 0.1 % Apply topically 2 (two) times daily. 454 g 0  . [DISCONTINUED] potassium chloride (K-DUR) 10 MEQ tablet Take 1 tablet (10 mEq total) by mouth 2 (two) times daily. 10 tablet 1   No current facility-administered medications on file prior to visit.    Review of Systems  Respiratory: Positive for cough and shortness of breath (  occasional).   Musculoskeletal: Positive for myalgias and arthralgias.      Objective:    BP 170/92 mmHg  Pulse 107  Temp(Src) 98.5 F (36.9 C) (Oral)  Resp 20  Wt 121 lb 9.6 oz (55.157 kg)  SpO2 97% Nursing note and vital signs reviewed.  O2 at rest on RA 97%  SpO2 with exertion on room air 85% and pain 8/10.  Physical Exam  Constitutional: She is oriented to person, place, and time. She appears well-developed and well-nourished. No distress.  Cardiovascular: Normal rate, regular rhythm, normal heart sounds and intact distal pulses.   Pulmonary/Chest: Effort normal and breath sounds  normal.  Musculoskeletal:  Upper extremity strength is grossly 4+ out of 5. Grip strength is equal and appropriate bilaterally.  Lower extremity strength is grossly 4+ out of 5, however with exertion this decreases to 3+ out of 5.  Neurological: She is alert and oriented to person, place, and time.  Skin: Skin is warm and dry.  Psychiatric: She has a normal mood and affect. Her behavior is normal. Judgment and thought content normal.       Assessment & Plan:

## 2014-05-04 NOTE — Patient Instructions (Signed)
Thank you for choosing [facility].  Summary/Instructions:  Your prescription(s) have been submitted to your pharmacy. Please take as directed and contact our office if you believe you are having problem(s) with the medication(s).   

## 2014-05-04 NOTE — Assessment & Plan Note (Signed)
Notes improvement with the switch to Valium. Refill MS contin, Valium and roxicodone at current dosages.

## 2014-05-09 DIAGNOSIS — R32 Unspecified urinary incontinence: Secondary | ICD-10-CM | POA: Diagnosis not present

## 2014-05-10 DIAGNOSIS — R32 Unspecified urinary incontinence: Secondary | ICD-10-CM | POA: Diagnosis not present

## 2014-05-11 ENCOUNTER — Telehealth: Payer: Self-pay | Admitting: Family

## 2014-05-11 NOTE — Telephone Encounter (Signed)
States received order for wheelchair but did not received demographics (with height and weight) and insurance  Fax  [phone]

## 2014-05-11 NOTE — Telephone Encounter (Signed)
Papers faxed to the given number

## 2014-06-01 ENCOUNTER — Encounter: Payer: Self-pay | Admitting: Family

## 2014-06-01 ENCOUNTER — Ambulatory Visit (INDEPENDENT_AMBULATORY_CARE_PROVIDER_SITE_OTHER): Payer: Medicare Other | Admitting: Family

## 2014-06-01 VITALS — BP 148/92 | HR 114 | Temp 98.9°F | Resp 20 | Wt 119.0 lb

## 2014-06-01 DIAGNOSIS — G894 Chronic pain syndrome: Secondary | ICD-10-CM

## 2014-06-01 DIAGNOSIS — J449 Chronic obstructive pulmonary disease, unspecified: Secondary | ICD-10-CM | POA: Diagnosis not present

## 2014-06-01 MED ORDER — OXYCODONE HCL 15 MG PO TABS: 15.0000 mg | ORAL_TABLET | ORAL | Status: DC | PRN

## 2014-06-01 MED ORDER — MORPHINE SULFATE ER 100 MG PO TBCR: 100.0000 mg | EXTENDED_RELEASE_TABLET | Freq: Two times a day (BID) | ORAL | Status: DC

## 2014-06-01 MED ORDER — DIAZEPAM 5 MG PO TABS: 5.0000 mg | ORAL_TABLET | Freq: Two times a day (BID) | ORAL | Status: DC | PRN

## 2014-06-01 NOTE — Assessment & Plan Note (Signed)
Pain fairly controlled with current regimen. Refill oxycodone, morphine, and Valium at current dosage.

## 2014-06-01 NOTE — Progress Notes (Signed)
Pre visit review using our clinic review tool, if applicable. No additional management support is needed unless otherwise documented below in the visit note. 

## 2014-06-01 NOTE — Progress Notes (Signed)
Subjective:    Patient ID: Rhonda Ramos, female    DOB: 07/29/1965, 49 y.o.   MRN: [id number]  Chief Complaint  Patient presents with  . Follow-up    wheelchair follow up    HPI:  Rhonda BostonLori A Sitts is a 49 y.o. female who presents today for a follow up.   Previously seen for a mobility assessment and wheelchair. That request was denied per insurance. She continues to experience the associated symptoms of shortness of breath when ambulating. She currently uses a walker with the a seat during ambulation. She is also requesting a refill of her medications.   Allergies  Allergen Reactions  . Latex Shortness Of Breath  . Codeine Nausea And Vomiting  . Erythromycin Nausea And Vomiting  . Sulfa Antibiotics Nausea And Vomiting    Current Outpatient Prescriptions on File Prior to Visit  Medication Sig Dispense Refill  . albuterol (PROVENTIL HFA;VENTOLIN HFA) 108 (90 BASE) MCG/ACT inhaler Inhale 2 puffs into the lungs every 6 (six) hours as needed for wheezing. 18 g 1  . amLODipine (NORVASC) 5 MG tablet Take 1 tablet (5 mg total) by mouth daily. 30 tablet 0  . amoxicillin-clavulanate (AUGMENTIN) 875-125 MG per tablet Take 1 tablet by mouth 2 (two) times daily. 20 tablet 0  . cyclobenzaprine (FLEXERIL) 10 MG tablet Take 1 tablet (10 mg total) by mouth 3 (three) times daily as needed for muscle spasms. 60 tablet 3  . Dextromethorphan-Guaifenesin (MUCINEX DM) 30-600 MG TB12 1 po bid prn cough 28 each 0  . FLUoxetine (PROZAC) 20 MG capsule take 3 capsules by mouth once daily 90 capsule 5  . [name] 0.35 MG tablet take 1 tablet by mouth once daily 28 tablet 0  . hydrOXYzine (VISTARIL) 50 MG capsule take 1 capsule by mouth every 8 hours if needed for itching 90 capsule 1  . levETIRAcetam (KEPPRA XR) 500 MG 24 hr tablet Take 1 tablet (500 mg total) by mouth daily. 90 tablet 1  . Multiple Vitamin (MULITIVITAMIN WITH MINERALS) TABS Take 1 tablet by mouth daily.    Marland Kitchen. omeprazole (PRILOSEC) 20 MG capsule  take 1 capsule by mouth once daily 30 capsule 5  . oxybutynin (DITROPAN-XL) 10 MG 24 hr tablet take 1 tablet by mouth once daily 30 tablet 5  . triamcinolone cream (KENALOG) 0.1 % Apply topically 2 (two) times daily. 454 g 0  . [DISCONTINUED] potassium chloride (K-DUR) 10 MEQ tablet Take 1 tablet (10 mEq total) by mouth 2 (two) times daily. 10 tablet 1   No current facility-administered medications on file prior to visit.    Review of Systems  Constitutional: Negative for fever and chills.  Respiratory: Positive for shortness of breath (with exertion). Negative for chest tightness.   Cardiovascular: Negative for chest pain, palpitations and leg swelling.      Objective:    BP 148/92 mmHg  Pulse 114  Temp(Src) 98.9 F (37.2 C) (Oral)  Resp 20  Wt 119 lb (53.978 kg)  SpO2 94% Nursing note and vital signs reviewed.  Physical Exam  Constitutional: She is oriented to person, place, and time. She appears well-developed and well-nourished. No distress.  Cardiovascular: Normal rate, regular rhythm, normal heart sounds and intact distal pulses.   Pulmonary/Chest: Effort normal and breath sounds normal.  Neurological: She is alert and oriented to person, place, and time.  Skin: Skin is warm and dry.  Psychiatric: She has a normal mood and affect. Her behavior is normal. Judgment and thought content  normal.       Assessment & Plan:

## 2014-06-01 NOTE — Patient Instructions (Signed)
Thank you for choosing [facility].  Summary/Instructions:  Your prescription(s) have been submitted to your pharmacy or been printed and provided for you. Please take as directed and contact our office if you believe you are having problem(s) with the medication(s) or have any questions.  If your symptoms worsen or fail to improve, please contact our office for further instruction, or in case of emergency go directly to the emergency room at the closest medical facility.     

## 2014-06-01 NOTE — Assessment & Plan Note (Signed)
Patient's sats are 94% on room air while resting. Continues to experience dyspnea on exertion. Discussed with patient potential for portable oxygen, however she does not meet the requirements at this time. Follow up as needed.

## 2014-06-07 DIAGNOSIS — R32 Unspecified urinary incontinence: Secondary | ICD-10-CM | POA: Diagnosis not present

## 2014-06-08 DIAGNOSIS — R32 Unspecified urinary incontinence: Secondary | ICD-10-CM | POA: Diagnosis not present

## 2014-06-16 DIAGNOSIS — Z01419 Encounter for gynecological examination (general) (routine) without abnormal findings: Secondary | ICD-10-CM | POA: Diagnosis not present

## 2014-06-17 ENCOUNTER — Other Ambulatory Visit: Payer: Self-pay | Admitting: Internal Medicine

## 2014-06-22 ENCOUNTER — Other Ambulatory Visit: Payer: Self-pay | Admitting: Internal Medicine

## 2014-06-27 ENCOUNTER — Telehealth: Payer: Self-pay | Admitting: Family

## 2014-06-27 MED ORDER — OXYCODONE HCL 15 MG PO TABS: 15.0000 mg | ORAL_TABLET | ORAL | Status: DC | PRN

## 2014-06-27 MED ORDER — MORPHINE SULFATE ER 100 MG PO TBCR: 100.0000 mg | EXTENDED_RELEASE_TABLET | Freq: Two times a day (BID) | ORAL | Status: DC

## 2014-06-27 MED ORDER — DIAZEPAM 5 MG PO TABS: 5.0000 mg | ORAL_TABLET | Freq: Two times a day (BID) | ORAL | Status: DC | PRN

## 2014-06-27 NOTE — Telephone Encounter (Signed)
Rx has been filled. Pt aware.

## 2014-06-27 NOTE — Telephone Encounter (Signed)
Patient ask if Dr Carver Filaalone could call in prescriptions [facility] Contin, Oxycodone 154 mg, Diazepam, and could you have it ready around 2:00

## 2014-06-29 DIAGNOSIS — M542 Cervicalgia: Secondary | ICD-10-CM | POA: Diagnosis not present

## 2014-06-30 ENCOUNTER — Telehealth: Payer: Self-pay

## 2014-06-30 ENCOUNTER — Telehealth: Payer: Self-pay | Admitting: Internal Medicine

## 2014-06-30 NOTE — Telephone Encounter (Signed)
LVM for pt to call back.   RE: FLU VACCINE? 

## 2014-06-30 NOTE — Telephone Encounter (Signed)
Patient got a flu shot today at her pharmacy.

## 2014-07-03 NOTE — Telephone Encounter (Signed)
Immunization documented

## 2014-07-04 ENCOUNTER — Telehealth: Payer: Self-pay | Admitting: Family

## 2014-07-04 ENCOUNTER — Other Ambulatory Visit (INDEPENDENT_AMBULATORY_CARE_PROVIDER_SITE_OTHER): Payer: Medicare Other

## 2014-07-04 ENCOUNTER — Ambulatory Visit: Payer: Self-pay | Admitting: Family

## 2014-07-04 ENCOUNTER — Encounter: Payer: Self-pay | Admitting: Family

## 2014-07-04 ENCOUNTER — Ambulatory Visit (INDEPENDENT_AMBULATORY_CARE_PROVIDER_SITE_OTHER): Payer: Medicare Other | Admitting: Family

## 2014-07-04 ENCOUNTER — Ambulatory Visit (INDEPENDENT_AMBULATORY_CARE_PROVIDER_SITE_OTHER)
Admission: RE | Admit: 2014-07-04 | Discharge: 2014-07-04 | Disposition: A | Discharge status: Home / Self Care | Payer: Medicare Other | Source: Ambulatory Visit | Attending: Family | Admitting: Family

## 2014-07-04 VITALS — BP 132/90 | HR 78 | Temp 99.6°F | Resp 18

## 2014-07-04 DIAGNOSIS — R05 Cough: Secondary | ICD-10-CM | POA: Diagnosis not present

## 2014-07-04 DIAGNOSIS — R110 Nausea: Secondary | ICD-10-CM | POA: Diagnosis not present

## 2014-07-04 DIAGNOSIS — R059 Cough, unspecified: Secondary | ICD-10-CM

## 2014-07-04 DIAGNOSIS — R32 Unspecified urinary incontinence: Secondary | ICD-10-CM | POA: Diagnosis not present

## 2014-07-04 DIAGNOSIS — R509 Fever, unspecified: Secondary | ICD-10-CM | POA: Diagnosis not present

## 2014-07-04 DIAGNOSIS — J449 Chronic obstructive pulmonary disease, unspecified: Secondary | ICD-10-CM | POA: Diagnosis not present

## 2014-07-04 LAB — URINALYSIS, ROUTINE W REFLEX MICROSCOPIC
Bilirubin Urine: NEGATIVE
HGB URINE DIPSTICK: NEGATIVE
Ketones, ur: NEGATIVE
NITRITE: POSITIVE — AB
RBC / HPF: NONE SEEN (ref 0–?)
SPECIFIC GRAVITY, URINE: 1.01 (ref 1.000–1.030)
Total Protein, Urine: NEGATIVE
URINE GLUCOSE: NEGATIVE
Urobilinogen, UA: 0.2 (ref 0.0–1.0)
pH: 6.5 (ref 5.0–8.0)

## 2014-07-04 MED ORDER — PROMETHAZINE HCL 25 MG/ML IJ SOLN
25.0000 mg | Freq: Once | INTRAMUSCULAR | Status: AC
  Administered 2014-07-04: 25 mg via INTRAMUSCULAR

## 2014-07-04 MED ORDER — LEVOFLOXACIN 500 MG PO TABS: 500.0000 mg | ORAL_TABLET | Freq: Every day | ORAL | Status: DC

## 2014-07-04 NOTE — Progress Notes (Signed)
Subjective:    Patient ID: Rhonda Ramos, female    DOB: 10/05/1965, 49 y.o.   MRN: [id number]  Chief Complaint  Patient presents with  . Cough    Body aches, chills, fever, nausea, fatigue, not eating, coughing up alot of phlem x4 days    HPI:  Rhonda Ramos is a 49 y.o. female who presents today for an acute visit.  This is a new problem. Associated problem of body aches, chills, nausea, fatigue, decreased appetite and productive cough which has been going on for about 4 days. She has taken Advil, Robutussin and Mucinex. Denies any recent antibiotic use.   Allergies  Allergen Reactions  . Latex Shortness Of Breath  . Codeine Nausea And Vomiting  . Erythromycin Nausea And Vomiting  . Sulfa Antibiotics Nausea And Vomiting    Current Outpatient Prescriptions on File Prior to Visit  Medication Sig Dispense Refill  . albuterol (PROVENTIL HFA;VENTOLIN HFA) 108 (90 BASE) MCG/ACT inhaler Inhale 2 puffs into the lungs every 6 (six) hours as needed for wheezing. 18 g 1  . amLODipine (NORVASC) 5 MG tablet Take 1 tablet (5 mg total) by mouth daily. 30 tablet 0  . amoxicillin-clavulanate (AUGMENTIN) 875-125 MG per tablet Take 1 tablet by mouth 2 (two) times daily. 20 tablet 0  . cyclobenzaprine (FLEXERIL) 10 MG tablet Take 1 tablet (10 mg total) by mouth 3 (three) times daily as needed for muscle spasms. 60 tablet 3  . Dextromethorphan-Guaifenesin (MUCINEX DM) 30-600 MG TB12 1 po bid prn cough 28 each 0  . diazepam (VALIUM) 5 MG tablet Take 1 tablet (5 mg total) by mouth every 12 (twelve) hours as needed for anxiety. 60 tablet 0  . diazepam (VALIUM) 5 MG tablet Take 1 tablet (5 mg total) by mouth every 12 (twelve) hours as needed for anxiety. 60 tablet 0  . FLUoxetine (PROZAC) 20 MG capsule take 3 capsules by mouth once daily 90 capsule 5  . [name] 0.35 MG tablet take 1 tablet by mouth once daily 28 tablet 0  . hydrOXYzine (VISTARIL) 50 MG capsule Take 1 capsule (50 mg total) by mouth every  8 (eight) hours. 90 capsule 0  . levETIRAcetam (KEPPRA XR) 500 MG 24 hr tablet Take 1 tablet (500 mg total) by mouth daily. 90 tablet 1  . morphine (MS CONTIN) 100 MG 12 hr tablet Take 1 tablet (100 mg total) by mouth 2 (two) times daily. 60 tablet 0  . morphine (MS CONTIN) 100 MG 12 hr tablet Take 1 tablet (100 mg total) by mouth 2 (two) times daily. 60 tablet 0  . Multiple Vitamin (MULITIVITAMIN WITH MINERALS) TABS Take 1 tablet by mouth daily.    Marland Kitchen omeprazole (PRILOSEC) 20 MG capsule take 1 capsule by mouth once daily 30 capsule 5  . oxybutynin (DITROPAN-XL) 10 MG 24 hr tablet take 1 tablet by mouth once daily 30 tablet 5  . oxyCODONE (ROXICODONE) 15 MG immediate release tablet Take 1 tablet (15 mg total) by mouth every 4 (four) hours as needed for pain. 45 tablet 0  . oxyCODONE (ROXICODONE) 15 MG immediate release tablet Take 1 tablet (15 mg total) by mouth every 4 (four) hours as needed for pain. 45 tablet 0  . triamcinolone cream (KENALOG) 0.1 % Apply topically 2 (two) times daily. 454 g 0  . [DISCONTINUED] potassium chloride (K-DUR) 10 MEQ tablet Take 1 tablet (10 mEq total) by mouth 2 (two) times daily. 10 tablet 1   No current facility-administered  medications on file prior to visit.    Review of Systems  Constitutional: Positive for fever and chills.  Respiratory: Positive for cough. Negative for chest tightness, shortness of breath and wheezing.   Cardiovascular: Negative for chest pain.  Gastrointestinal: Positive for nausea.  Neurological: Positive for headaches.      Objective:    BP 132/90 mmHg  Pulse 78  Temp(Src) 99.6 F (37.6 C) (Oral)  Resp 18  SpO2 96% Nursing note and vital signs reviewed.  Physical Exam  Constitutional: She is oriented to person, place, and time. She appears well-developed and well-nourished. No distress.  HENT:  Right Ear: Hearing, tympanic membrane, external ear and ear canal normal.  Left Ear: Hearing, tympanic membrane, external ear and  ear canal normal.  Nose: Nose normal. Right sinus exhibits no maxillary sinus tenderness and no frontal sinus tenderness. Left sinus exhibits no maxillary sinus tenderness and no frontal sinus tenderness.  Mouth/Throat: Uvula is midline, oropharynx is clear and moist and mucous membranes are normal.  Neck: Neck supple.  Cardiovascular: Normal rate, regular rhythm, normal heart sounds and intact distal pulses.   Pulmonary/Chest: Effort normal. She has wheezes.  Lymphadenopathy:    She has no cervical adenopathy.  Neurological: She is alert and oriented to person, place, and time.  Skin: Skin is warm and dry.  Psychiatric: She has a normal mood and affect. Her behavior is normal. Judgment and thought content normal.       Assessment & Plan:

## 2014-07-04 NOTE — Assessment & Plan Note (Addendum)
Symptoms and exam consistent with potential bronchitis however cannot rule out pneumonia. Start Levaquin. Obtain chest x-ray to rule out pneumonia. Continue over-the-counter medication as needed for symptom relief. Patient requests a shot of Phenergan for nausea. Obtain UA to rule out urinary tract infection/sepsis. Follow-up if symptoms worsen or fail to improve.

## 2014-07-04 NOTE — Patient Instructions (Addendum)
Thank you for choosing [facility].  Summary/Instructions:  Your prescription(s) have been submitted to your pharmacy or been printed and provided for you. Please take as directed and contact our office if you believe you are having problem(s) with the medication(s) or have any questions.  Please stop by the lab on the basement level of the building for your blood work. Your results will be released to MyChart (or called to you) after review, usually within 72 hours after test completion. If any changes need to be made, you will be notified at that same time.  If your symptoms worsen or fail to improve, please contact our office for further instruction, or in case of emergency go directly to the emergency room at the closest medical facility.   General Recommendations:    Please drink plenty of fluids.  Get plenty of rest   Sleep in humidified air  Use saline nasal sprays  Netti pot   OTC Medications:  Decongestants - helps relieve congestion   Flonase (generic fluticasone) or Nasacort (generic triamcinolone) - please make sure to use the "cross-over" technique at a 45 degree angle towards the opposite eye as opposed to straight up the nasal passageway.   Sudafed (generic pseudoephedrine - Note this is the one that is available behind the pharmacy counter); Products with phenylephrine (-PE) may also be used but is often not as effective as pseudoephedrine.   If you have HIGH BLOOD PRESSURE - Coricidin HBP; AVOID any product that is -D as this contains pseudoephedrine which may increase your blood pressure.  Afrin (oxymetazoline) every 6-8 hours for up to 3 days.   Allergies - helps relieve runny nose, itchy eyes and sneezing   Claritin (generic loratidine), Allegra (fexofenidine), or Zyrtec (generic cyrterizine) for runny nose. These medications should not cause drowsiness.  Note - Benadryl (generic diphenhydramine) may be used however may cause drowsiness  Cough  -   Delsym or Robitussin (generic dextromethorphan)  Expectorants - helps loosen mucus to ease removal   Mucinex (generic guaifenesin) as directed on the package.  Headaches / General Aches   Tylenol (generic acetaminophen) - DO NOT EXCEED 3 grams (3,000 mg) in a 24 hour time period  Advil/Motrin (generic ibuprofen)   Sore Throat -   Salt water gargle   Chloraseptic (generic benzocaine) spray or lozenges / Sucrets (generic dyclonine)     

## 2014-07-04 NOTE — Telephone Encounter (Signed)
Please inform the patient   Your x-ray results show no evidence of pneumonia and no pneumothorax. Therefore you are likely dealing with acute bronchitis or a COPD exacerbation. Your urinalysis showed that you do have a urinary tract infection. The good news is that the antibiotic given will cover both conditions.

## 2014-07-04 NOTE — Progress Notes (Signed)
Pre visit review using our clinic review tool, if applicable. No additional management support is needed unless otherwise documented below in the visit note. 

## 2014-07-05 DIAGNOSIS — R32 Unspecified urinary incontinence: Secondary | ICD-10-CM | POA: Diagnosis not present

## 2014-07-06 NOTE — Telephone Encounter (Signed)
LVM for pt to call back.

## 2014-07-07 NOTE — Telephone Encounter (Signed)
LVM for pt to call back again ?

## 2014-07-10 NOTE — Telephone Encounter (Signed)
Pt aware of results 

## 2014-07-25 ENCOUNTER — Telehealth: Payer: Self-pay | Admitting: Internal Medicine

## 2014-07-25 DIAGNOSIS — Z79899 Other long term (current) drug therapy: Secondary | ICD-10-CM | POA: Diagnosis not present

## 2014-07-25 DIAGNOSIS — Z79891 Long term (current) use of opiate analgesic: Secondary | ICD-10-CM | POA: Diagnosis not present

## 2014-07-25 MED ORDER — MORPHINE SULFATE ER 100 MG PO TBCR: 100.0000 mg | EXTENDED_RELEASE_TABLET | Freq: Two times a day (BID) | ORAL | Status: DC

## 2014-07-25 MED ORDER — OXYCODONE HCL 15 MG PO TABS: 15.0000 mg | ORAL_TABLET | ORAL | Status: DC | PRN

## 2014-07-25 NOTE — Telephone Encounter (Signed)
Patient called to inquire on below notes. Advised of MD notes. She is coming to do UDS today

## 2014-07-25 NOTE — Telephone Encounter (Signed)
Pt called in and wanted to know if you could refill her   Morphone Oxycodone Valium

## 2014-07-25 NOTE — Telephone Encounter (Signed)
I have never rx'd diazepam for pt - began by Sharman CheekG Calone Per  controlled subst registry, pt filling both alpraz and diazepam I will not refill diazepam Will print rx for MSCont and oxy at # we agreed to (#30 oxy ir, not #45) Pt needs to submit urine for UDS at time of pick up BEFORE getting signed rx refills - further refills to depend on UDS results

## 2014-07-25 NOTE — Telephone Encounter (Signed)
Ok to refill 

## 2014-07-25 NOTE — Telephone Encounter (Signed)
Pt fiances came to pick up prescriptions. Inform him with md response below. Will hold prescriptions until mrs. Jeanella AntonReece come back to do the UDS...Raechel Chute/lmb

## 2014-07-30 ENCOUNTER — Other Ambulatory Visit: Payer: Self-pay | Admitting: Internal Medicine

## 2014-08-02 ENCOUNTER — Telehealth: Payer: Self-pay | Admitting: Internal Medicine

## 2014-08-02 NOTE — Telephone Encounter (Signed)
Spoke to pt and pt stated that she is not taking the xanax anymore and was switched to the valium. Pt is fine with going back to the xanax but needs help with dealing with anxiety.

## 2014-08-02 NOTE — Telephone Encounter (Signed)
See prior phone note i will not refill diazepam

## 2014-08-02 NOTE — Telephone Encounter (Signed)
Pt called in and said that she was suppose to be taking Valium instead of Xanax.  She wants to know if you are going to be able to refill the valium for her??

## 2014-08-03 NOTE — Telephone Encounter (Signed)
Patient will need to establish with a new primary care provider to discuss ongoing medication concerns. Patient also should be taking Prozac 60 mg daily as previously prescribed (no refill has been sent f/ our EHR since June 2015 when 30d supply with 5 refills was prescribed) May refill this medication if needed to help control anxiety symptoms

## 2014-08-04 DIAGNOSIS — R32 Unspecified urinary incontinence: Secondary | ICD-10-CM | POA: Diagnosis not present

## 2014-08-04 NOTE — Telephone Encounter (Signed)
Rhonda Ramos,  Patient is requesting to be taken under your care as PCP. She states that she "will take her friend's meds in the meantime".

## 2014-08-04 NOTE — Telephone Encounter (Signed)
I respectfully have to decline at

## 2014-08-07 ENCOUNTER — Encounter: Payer: Self-pay | Admitting: Family

## 2014-08-07 ENCOUNTER — Ambulatory Visit (INDEPENDENT_AMBULATORY_CARE_PROVIDER_SITE_OTHER): Payer: Medicare Other | Admitting: Family

## 2014-08-07 VITALS — BP 140/82 | HR 115 | Temp 98.8°F | Resp 18 | Ht <= 58 in | Wt 126.1 lb

## 2014-08-07 DIAGNOSIS — N319 Neuromuscular dysfunction of bladder, unspecified: Secondary | ICD-10-CM | POA: Diagnosis not present

## 2014-08-07 DIAGNOSIS — F419 Anxiety disorder, unspecified: Secondary | ICD-10-CM | POA: Diagnosis not present

## 2014-08-07 MED ORDER — ALPRAZOLAM 0.5 MG PO TABS: 0.5000 mg | ORAL_TABLET | Freq: Two times a day (BID) | ORAL | Status: DC | PRN

## 2014-08-07 NOTE — Progress Notes (Signed)
Pre visit review using our clinic review tool, if applicable. No additional management support is needed unless otherwise documented below in the visit note. 

## 2014-08-07 NOTE — Assessment & Plan Note (Signed)
Previously diagnosed with anxiety and indicates that it has continued to worsen lately enough to effect her sleep. Discontinu Valium and start Xanax twice daily as needed for anxiety and sleep. Was previously on Xanax and notes that it works better.

## 2014-08-07 NOTE — Patient Instructions (Signed)
Thank you for choosing [facility].  Summary/Instructions:  Please restart taking Xanax twice daily as needed for anxiety and sleep.   Please STOP taking the valium.

## 2014-08-07 NOTE — Progress Notes (Signed)
Subjective:    Patient ID: Rhonda Ramos, female    DOB: 08/26/1965, 49 y.o.   MRN: [id number]  Chief Complaint  Patient presents with  . Follow-up    wants to take xanax instead of valium, also needs catheters and needs drs notes for them    HPI:  Rhonda Ramos is a 49 y.o. female who presents today for follow up.   1) Neurogenic bladder - Patient  Previously diagnosed with spina bifida and developed a neurogenic bladder as a result. She performs self-catheterizations 5x per day using size 12 French catheters for over 30 years now. She is requesting a refill of these catheters. She is taking Ditropan as well to assist with this.  2) Anxiety - Previously prescribed valium and indicates that her anxiety continues to provide a challenge. Indicates that she is having increased difficulty falling asleep at night. Currently taking 5 mg of valium and would like to switch back to Xanax.   Allergies  Allergen Reactions  . Latex Shortness Of Breath  . Codeine Nausea And Vomiting  . Erythromycin Nausea And Vomiting  . Sulfa Antibiotics Nausea And Vomiting    Current Outpatient Prescriptions on File Prior to Visit  Medication Sig Dispense Refill  . Dextromethorphan-Guaifenesin (MUCINEX DM) 30-600 MG TB12 1 po bid prn cough 28 each 0  . FLUoxetine (PROZAC) 20 MG capsule take 3 capsules by mouth once daily 90 capsule 5  . [name] 0.35 MG tablet take 1 tablet by mouth once daily 28 tablet 0  . hydrOXYzine (VISTARIL) 50 MG capsule Take 1 capsule (50 mg total) by mouth 3 (three) times daily as needed for anxiety. 90 capsule 0  . levETIRAcetam (KEPPRA XR) 500 MG 24 hr tablet Take 1 tablet (500 mg total) by mouth daily. 90 tablet 1  . morphine (MS CONTIN) 100 MG 12 hr tablet Take 1 tablet (100 mg total) by mouth 2 (two) times daily. 60 tablet 0  . Multiple Vitamin (MULITIVITAMIN WITH MINERALS) TABS Take 1 tablet by mouth daily.    Marland Kitchen. omeprazole (PRILOSEC) 20 MG capsule take 1 capsule by mouth once  daily 30 capsule 5  . oxybutynin (DITROPAN-XL) 10 MG 24 hr tablet take 1 tablet by mouth once daily 30 tablet 5  . oxyCODONE (ROXICODONE) 15 MG immediate release tablet Take 1 tablet (15 mg total) by mouth every 4 (four) hours as needed for pain. 30 tablet 0  . [DISCONTINUED] potassium chloride (K-DUR) 10 MEQ tablet Take 1 tablet (10 mEq total) by mouth 2 (two) times daily. 10 tablet 1   No current facility-administered medications on file prior to visit.    Review of Systems  Constitutional: Negative for fever and chills.  Genitourinary:       Positive for neurogenic bladder.   Psychiatric/Behavioral: Positive for sleep disturbance. The patient is nervous/anxious.       Objective:    BP 140/82 mmHg  Pulse 115  Temp(Src) 98.8 F (37.1 C) (Oral)  Resp 18  Ht 4\' 8"  (1.422 m)  Wt 126 lb 1.9 oz (57.208 kg)  BMI 28.29 kg/m2  SpO2 97% Nursing note and vital signs reviewed.  Physical Exam  Constitutional: She is oriented to person, place, and time. She appears well-developed and well-nourished. No distress.  Seated in a wheelchair, dressed appropriately for the situation and appears her stated age.   Cardiovascular: Normal rate, regular rhythm, normal heart sounds and intact distal pulses.   Pulmonary/Chest: Effort normal and breath sounds normal.  Neurological: She is alert and oriented to person, place, and time.  Skin: Skin is warm and dry.  Psychiatric: She has a normal mood and affect. Her behavior is normal. Judgment and thought content normal. Her mood appears not anxious. She does not exhibit a depressed mood.       Assessment & Plan:

## 2014-08-07 NOTE — Assessment & Plan Note (Signed)
Stable with current regimen of self-catheterization and ditropan. Patient requests refills of her catheters and paper work will be faxed to be completed. Continue current dosage of ditropan. Follow up as needed.

## 2014-08-22 ENCOUNTER — Telehealth: Payer: Self-pay | Admitting: Family

## 2014-08-22 NOTE — Telephone Encounter (Signed)
Patient is requesting a refill for morphine (MS CONTIN) 100 MG 12 hr tablet [[id number]][[id number]] / oxyCODONE (ROXICODONE) 15 MG immediate release tablet [[id number]][[id number]].   Dr. Dorise HissKollar,  Patient is requesting to be taken under your care. Dr. Felicity CoyerLeschber has advised that she needs to seek new PCP, and Tammy SoursGreg is respectfully declining. Is this patient one that you would want to take under your care? Do you have suggestions on who to refer patient to if not?

## 2014-08-23 MED ORDER — OXYCODONE HCL 15 MG PO TABS: 15.0000 mg | ORAL_TABLET | ORAL | Status: DC | PRN

## 2014-08-23 MED ORDER — MORPHINE SULFATE ER 100 MG PO TBCR: 100.0000 mg | EXTENDED_RELEASE_TABLET | Freq: Two times a day (BID) | ORAL | Status: DC

## 2014-08-23 NOTE — Telephone Encounter (Signed)
meds refilled per protocol agreement Thanks for helping pt find regular PCP

## 2014-08-24 ENCOUNTER — Telehealth: Payer: Self-pay | Admitting: Internal Medicine

## 2014-08-24 NOTE — Telephone Encounter (Signed)
Scheduled patient to be seen

## 2014-08-24 NOTE — Telephone Encounter (Signed)
I'm happy to see any patient for an acute complaint. Likely she would need a 30 minute slot.

## 2014-08-24 NOTE — Telephone Encounter (Signed)
Dr Dorise HissKollar,  Are you ok with seeing this patient regarding the following issues?  - patient feels that she shrunk about 1 inch in the last 6 months - patient is experiencing sleeping problems where she will stay awake for >72 hrs continuously  i will schedule the patient upon your approval. Thank you.

## 2014-09-02 ENCOUNTER — Other Ambulatory Visit: Payer: Self-pay | Admitting: Internal Medicine

## 2014-09-04 ENCOUNTER — Other Ambulatory Visit: Payer: Self-pay

## 2014-09-04 MED ORDER — FLUOXETINE HCL 20 MG PO CAPS: 60.0000 mg | ORAL_CAPSULE | Freq: Every day | ORAL | Status: DC

## 2014-09-04 NOTE — Telephone Encounter (Signed)
Left message advising pt that med refill has been sent to [facility]

## 2014-09-04 NOTE — Telephone Encounter (Signed)
All refills done - erx done

## 2014-09-05 ENCOUNTER — Ambulatory Visit: Payer: Medicare Other | Admitting: Internal Medicine

## 2014-09-05 DIAGNOSIS — Z0289 Encounter for other administrative examinations: Secondary | ICD-10-CM

## 2014-09-07 ENCOUNTER — Telehealth: Payer: Self-pay | Admitting: Internal Medicine

## 2014-09-07 NOTE — Telephone Encounter (Signed)
Patient requesting refill for ALPRAZolam (XANAX) 0.5 MG tablet [[id number]. She has an appointment 5/10 with Dr. Dorise HissKollar

## 2014-09-08 MED ORDER — ALPRAZOLAM 0.5 MG PO TABS: 0.5000 mg | ORAL_TABLET | Freq: Two times a day (BID) | ORAL | Status: DC | PRN

## 2014-09-08 NOTE — Telephone Encounter (Signed)
Dr. Dorise HissKollar, would you like to fill this?

## 2014-09-08 NOTE — Telephone Encounter (Signed)
Patient called back regarding note below °

## 2014-09-08 NOTE — Telephone Encounter (Signed)
Faxed to pharmacy. Patient is aware.

## 2014-09-08 NOTE — Telephone Encounter (Signed)
Printed and signed for [employer] patient.

## 2014-09-11 ENCOUNTER — Encounter: Payer: Self-pay | Admitting: Internal Medicine

## 2014-09-12 ENCOUNTER — Encounter: Payer: Self-pay | Admitting: Internal Medicine

## 2014-09-12 ENCOUNTER — Ambulatory Visit (INDEPENDENT_AMBULATORY_CARE_PROVIDER_SITE_OTHER): Payer: Medicare Other | Admitting: Internal Medicine

## 2014-09-12 VITALS — BP 138/80 | HR 94 | Temp 99.3°F | Resp 18 | Ht <= 58 in | Wt 125.0 lb

## 2014-09-12 DIAGNOSIS — R29890 Loss of height: Secondary | ICD-10-CM

## 2014-09-12 DIAGNOSIS — G4700 Insomnia, unspecified: Secondary | ICD-10-CM | POA: Diagnosis not present

## 2014-09-12 MED ORDER — OXYCODONE HCL 15 MG PO TABS: 15.0000 mg | ORAL_TABLET | ORAL | Status: DC | PRN

## 2014-09-12 MED ORDER — MORPHINE SULFATE ER 100 MG PO TBCR: 100.0000 mg | EXTENDED_RELEASE_TABLET | Freq: Two times a day (BID) | ORAL | Status: DC

## 2014-09-12 NOTE — Progress Notes (Signed)
Pre visit review using our clinic review tool, if applicable. No additional management support is needed unless otherwise documented below in the visit note. 

## 2014-09-12 NOTE — Patient Instructions (Signed)
We have refilled your medicines today that are not due until the 20th.   We will have you get a DEXA scan to make sure that the bones are still strong enough and not having any osteoporosis.   You are still Dr. Diamantina MonksLeschber's patient at this time. This was an acute visit only for the sleeping and the shrinking. I will talk to Dr. Felicity CoyerLeschber but I am currently checking how many patients I have to make sure that I can provide good care for all my patients and you will hear back about switching.

## 2014-09-15 DIAGNOSIS — G4700 Insomnia, unspecified: Secondary | ICD-10-CM | POA: Insufficient documentation

## 2014-09-15 DIAGNOSIS — R29890 Loss of height: Secondary | ICD-10-CM | POA: Insufficient documentation

## 2014-09-15 NOTE — Assessment & Plan Note (Signed)
Most likely related to her spinal disease. Checking bone density to ensure no osteoporosis to preserve her bone health.

## 2014-09-15 NOTE — Progress Notes (Signed)
   Subjective:    Patient ID: Rhonda Ramos, female    DOB: 02/01/1966, 49 y.o.   MRN: [id number]  HPI The patient is coming in for acute visit for sleeping problems and feeling like she has shrunk. She does have chronic back problems and scoliosis. She feels that she has shrunk an inch in the last year or so. No change to her back pain. No numbness or tingling in her legs. No change in bowel or bladder habits. Her sleeping problems have been going on for a long time. She will stay awake for several days in a row then sleep. She keeps trying to keep off track while I am trying to ask her questions about her sleep habits. She denies excessive energy during these times. She will save her xanax and take 2 to get her to sleep at night which sometimes works. She is not able to answer any more questions as she keeps talking about her medical history and her pain management and that she has never failed a drug test or pill count. I try to bring her back to the topic at hand but she is not interested in returning to that topic. Does want to know if I will switch her xanax to lorazepam or increase the dose.   Review of Systems  Constitutional: Negative.   Respiratory: Negative.   Cardiovascular: Negative.   Musculoskeletal: Positive for myalgias, back pain and arthralgias.  Skin: Negative.   Psychiatric/Behavioral: Positive for sleep disturbance and agitation. Negative for dysphoric mood. The patient is nervous/anxious.       Objective:   Physical Exam  Constitutional: She appears well-developed and well-nourished.  Short, in wheelchair  HENT:  Head: Normocephalic and atraumatic.  Eyes: EOM are normal.  Cardiovascular: Normal rate and regular rhythm.   Pulmonary/Chest: Effort normal. No respiratory distress. She has no wheezes. She has no rales.  Abdominal: Soft. She exhibits no distension. There is no tenderness. There is no rebound.  Skin: Skin is warm and dry.   Filed Vitals:   09/12/14 1439    BP: 138/80  Pulse: 94  Temp: 99.3 F (37.4 C)  TempSrc: Oral  Resp: 18  Height: 4\' 7"  (1.397 m)  Weight: 125 lb (56.7 kg)  SpO2: 96%      Assessment & Plan:

## 2014-09-15 NOTE — Assessment & Plan Note (Signed)
Unclear what is causing her to stay awake for 72 hours at a time. Recommended not to take more medicine at a time than prescribed. Did recommend talking to a sleep doctor to see what might be the better therapy for her but she is not interested. She is not interested in changing therapies at this time she is satisfied with her sleeping habits. Concerning for other label than anxiety and insomnia with this feature. She does deny excessive spending or energy spells so could be pure insomnia.

## 2014-09-18 ENCOUNTER — Telehealth: Payer: Self-pay | Admitting: Internal Medicine

## 2014-09-18 NOTE — Telephone Encounter (Signed)
Patient seen Dr. Dorise HissKollar last week for loss of height. She told her we would get her in for a bone density test. She called in about it and there was none scheduled so I went ahead and scheduled her for Wednesday 5/18 at 1:30. Please let me know if this appointment is fine.

## 2014-09-19 NOTE — Telephone Encounter (Signed)
Thank you, that is perfect.   

## 2014-09-20 ENCOUNTER — Inpatient Hospital Stay: Admission: RE | Admit: 2014-09-20 | Payer: Self-pay | Source: Ambulatory Visit

## 2014-09-22 ENCOUNTER — Ambulatory Visit (INDEPENDENT_AMBULATORY_CARE_PROVIDER_SITE_OTHER)
Admission: RE | Admit: 2014-09-22 | Discharge: 2014-09-22 | Disposition: A | Discharge status: Home / Self Care | Payer: Medicare Other | Source: Ambulatory Visit | Attending: Internal Medicine | Admitting: Internal Medicine

## 2014-09-22 DIAGNOSIS — Z13820 Encounter for screening for osteoporosis: Secondary | ICD-10-CM | POA: Diagnosis not present

## 2014-09-22 DIAGNOSIS — R29890 Loss of height: Secondary | ICD-10-CM | POA: Diagnosis not present

## 2014-09-27 DIAGNOSIS — M542 Cervicalgia: Secondary | ICD-10-CM | POA: Diagnosis not present

## 2014-09-28 NOTE — Telephone Encounter (Signed)
Pt called in and wanted to know if the results of the bone density is back in?  She would like someone to call her if so.

## 2014-09-29 NOTE — Telephone Encounter (Signed)
Spoke to pt and informed bone density results are not done and confirmed if she wanted PCP to sign on the cath supplies from [facility].

## 2014-10-05 ENCOUNTER — Encounter: Payer: Self-pay | Admitting: Internal Medicine

## 2014-10-05 ENCOUNTER — Telehealth: Payer: Self-pay | Admitting: Internal Medicine

## 2014-10-05 DIAGNOSIS — M810 Age-related osteoporosis without current pathological fracture: Secondary | ICD-10-CM | POA: Insufficient documentation

## 2014-10-05 NOTE — Telephone Encounter (Signed)
DEXA does confirm osteoporosis (significant bone loss) First line therapy is to initiate generic fosamax 70mg  weekly along with OTC Calcium 1200mg /d + Vit D 2000U/d If pt agrees, please send rx for fosamax If pt uncertain, please schedule [facility] with [facility] for further discussion and review Thanks!

## 2014-10-05 NOTE — Telephone Encounter (Signed)
Would you be able to review bone density results and advise---i will call patient back---dr kollar will not be back until Tuesday 6/7---thanks

## 2014-10-05 NOTE — Telephone Encounter (Signed)
Please call patient regarding the results from her bone density scan asap

## 2014-10-06 MED ORDER — ALENDRONATE SODIUM 70 MG PO TABS: 70.0000 mg | ORAL_TABLET | ORAL | Status: AC

## 2014-10-06 NOTE — Telephone Encounter (Signed)
erx for fosamax done.  Pt informed of results and MD recommendation.   RE: Pt wants to be able to walk before next year (class reunion). Advsied that PT may be needed and advised that an OV may be necessary.

## 2014-10-07 DIAGNOSIS — R32 Unspecified urinary incontinence: Secondary | ICD-10-CM | POA: Diagnosis not present

## 2014-10-09 ENCOUNTER — Telehealth: Payer: Self-pay | Admitting: Internal Medicine

## 2014-10-09 MED ORDER — ALPRAZOLAM 0.5 MG PO TABS: 0.5000 mg | ORAL_TABLET | Freq: Two times a day (BID) | ORAL | Status: DC | PRN

## 2014-10-09 NOTE — Telephone Encounter (Signed)
Patient is requesting refill for ALPRAZolam (XANAX) 0.5 MG tablet [[id number]. Also, She is not sure how much of the calcium and vitamin D she is supposed to be taking. Please call her when prescription is ready.

## 2014-10-09 NOTE — Telephone Encounter (Signed)
Spoke to pt. [facility]. Restated the dosage of the Vitamin D and the Calcium as recommended by PCP.

## 2014-10-11 NOTE — Telephone Encounter (Signed)
Pt called and wanted to verify the amount of Calcium and Vitamin D. She gave me the dose and strength of her Calcium + Vit D is 500 mg  - 400 U and the Vit D alone is 1000 U. Instructed her to take 2.5 tabs of the Ca+D and 1 tab of the D

## 2014-10-19 ENCOUNTER — Ambulatory Visit (INDEPENDENT_AMBULATORY_CARE_PROVIDER_SITE_OTHER): Payer: Medicare Other | Admitting: Family

## 2014-10-19 VITALS — BP 140/82 | HR 107 | Temp 98.4°F | Resp 18 | Ht 59.0 in | Wt 121.0 lb

## 2014-10-19 DIAGNOSIS — G4700 Insomnia, unspecified: Secondary | ICD-10-CM

## 2014-10-19 DIAGNOSIS — M810 Age-related osteoporosis without current pathological fracture: Secondary | ICD-10-CM | POA: Diagnosis not present

## 2014-10-19 DIAGNOSIS — G894 Chronic pain syndrome: Secondary | ICD-10-CM

## 2014-10-19 MED ORDER — OXYCODONE HCL 15 MG PO TABS: 15.0000 mg | ORAL_TABLET | ORAL | Status: DC | PRN

## 2014-10-19 MED ORDER — MORPHINE SULFATE ER 100 MG PO TBCR: 100.0000 mg | EXTENDED_RELEASE_TABLET | Freq: Two times a day (BID) | ORAL | Status: DC

## 2014-10-19 NOTE — Progress Notes (Signed)
Pre visit review using our clinic review tool, if applicable. No additional management support is needed unless otherwise documented below in the visit note. 

## 2014-10-19 NOTE — Progress Notes (Signed)
Subjective:    Patient ID: Rhonda Ramos, female    DOB: Oct 28, 1965, 49 y.o.   MRN: [id number]  Chief Complaint  Patient presents with  . Follow-up    wants to see about taking injections for osteoporosis instead of fosamax, having side effects to that medication, needs refill of pain meds    HPI:  Rhonda Ramos is a 49 y.o. female with a PMH of  who presents today for a follow up office visit.  1.) Osteoporosis - Recently diagnosed with osteoarthritis. Recently started on fosamax and has noted that she has a horrible headache and nausea on days in which she takes the medication. She is currently taking Vitamin D and calcium. Questions if it is appropriate to switch to potential monthly injection of Boniva.   2.) Chronic pain - Continues to experience chronic pain is is currently maintained on morphine and oxycotin. Indicates that her pain is manageable with the current regimen. She is requesting refills of the current medications. Notes that her functionality is significantly decreased without the medications.   3.) Insomnia - Notes difficulty falling asleep which has previously been discussed staying awake for a couple of days at a time and then sleeping. Questions if there is anything that can be done.    Allergies  Allergen Reactions  . Latex Shortness Of Breath  . Codeine Nausea And Vomiting  . Erythromycin Nausea And Vomiting  . Sulfa Antibiotics Nausea And Vomiting    Current Outpatient Prescriptions on File Prior to Visit  Medication Sig Dispense Refill  . alendronate (FOSAMAX) 70 MG tablet Take 1 tablet (70 mg total) by mouth every 7 (seven) days. Take with a full glass of water on an empty stomach. 12 tablet 3  . ALPRAZolam (XANAX) 0.5 MG tablet Take 1 tablet (0.5 mg total) by mouth 2 (two) times daily as needed for anxiety or sleep. 60 tablet 5  . amLODipine (NORVASC) 5 MG tablet Take 1 tablet (5 mg total) by mouth daily. 30 tablet 5  . Dextromethorphan-Guaifenesin  (MUCINEX DM) 30-600 MG TB12 1 po bid prn cough 28 each 0  . FLUoxetine (PROZAC) 20 MG capsule Take 3 capsules (60 mg total) by mouth daily. 90 capsule 2  . [name] 0.35 MG tablet take 1 tablet by mouth once daily 28 tablet 0  . hydrOXYzine (VISTARIL) 50 MG capsule Take 1 capsule (50 mg total) by mouth 3 (three) times daily as needed for anxiety. 90 capsule 3  . levETIRAcetam (KEPPRA XR) 500 MG 24 hr tablet Take 1 tablet (500 mg total) by mouth daily. 90 tablet 1  . Multiple Vitamin (MULITIVITAMIN WITH MINERALS) TABS Take 1 tablet by mouth daily.    Marland Kitchen omeprazole (PRILOSEC) 20 MG capsule take 1 capsule by mouth once daily 30 capsule 5  . oxybutynin (DITROPAN-XL) 10 MG 24 hr tablet Take 1 tablet (10 mg total) by mouth daily. 30 tablet 5  . [DISCONTINUED] potassium chloride (K-DUR) 10 MEQ tablet Take 1 tablet (10 mEq total) by mouth 2 (two) times daily. 10 tablet 1   No current facility-administered medications on file prior to visit.    Past Medical History  Diagnosis Date  . Chronic pain syndrome     s/p narc detox fall 2011  . SPINA BIFIDA   . Scoliosis     multiple surg for same  . DEPRESSION     > ptsd (spouse suicide/gsw 11/2005  . GERD   . HYPERTENSION   . Anxiety   .  Pneumothorax on right 04/03/11, 06/10/11    recurrent s/p VATS 2/13  . Seizures     onset fall 2011 with narc detox  . Chronic back pain greater than 3 months duration   . 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 Urinary tract infection 08/08/2011    E. coli  . Spontaneous tension pneumothorax 09/17/2011    Left spontaneous pneumothorax - first occurence  . Osteoporosis     DEXA @ [facility] 09/22/14: -2.7 L fem     Review of Systems  Constitutional: Negative for fever and chills.  Respiratory: Negative for chest tightness and shortness of breath.   Cardiovascular: Negative for chest pain, palpitations and leg swelling.  Gastrointestinal: Positive for nausea. Negative for  diarrhea.      Objective:    BP 140/82 mmHg  Pulse 107  Temp(Src) 98.4 F (36.9 C) (Oral)  Resp 18  Ht 4\' 11"  (1.499 m)  Wt 121 lb (54.885 kg)  BMI 24.43 kg/m2  SpO2 93% Nursing note and vital signs reviewed.  Physical Exam  Constitutional: She is oriented to person, place, and time. She appears well-developed and well-nourished. No distress.  Middle aged female appears slightly older than stated age, dressed appropriately and has a rollator walker in front of her.   Cardiovascular: Normal rate, regular rhythm, normal heart sounds and intact distal pulses.   Pulmonary/Chest: Effort normal and breath sounds normal.  Neurological: She is alert and oriented to person, place, and time.  Skin: Skin is warm and dry.  Psychiatric: She has a normal mood and affect. Her behavior is normal. Judgment and thought content normal.       Assessment & Plan:   Problem List Items Addressed This Visit      Musculoskeletal and Integument   Osteoporosis - Primary    Newly diagnosed with osteoporosis and started on Fosamax. Does describe some gastrointestinal side effects of medication. Discussed diagnosis and treatments of osteoporosis and potentials for referral to rheumatology for injectable bisphosphonates. Continue current dosage of fosamax, calcium and vitamin D at this time. If symptoms do not improve refer for further management.         Other   Chronic pain syndrome    Continues to experience chronic pain and indicates improved functionality with current regimen. Continue current dosages of morphine and oxycodone. Continue to monitor.       Relevant Medications   oxyCODONE (ROXICODONE) 15 MG immediate release tablet   morphine (MS CONTIN) 100 MG 12 hr tablet   Insomnia    Describes issues of insomnia that are occasionally relieved by Xanax. Discussed potential referral to sleep medicine given the complexity of her pain management and not wanting to add additional sedative  medications. Patient declined referral at this time.

## 2014-10-19 NOTE — Patient Instructions (Addendum)
Thank you for choosing [facility].  Summary/Instructions:  Please continue to take your medications as prescribed.   Your prescription(s) have been submitted to your pharmacy or been printed and provided for you. Please take as directed and contact our office if you believe you are having problem(s) with the medication(s) or have any questions.  If your symptoms worsen or fail to improve, please contact our office for further instruction, or in case of emergency go directly to the emergency room at the closest medical facility.    Osteoporosis Throughout your life, your body breaks down old bone and replaces it with new bone. As you get older, your body does not replace bone as quickly as it breaks it down. By the age of 30 years, most people begin to gradually lose bone because of the imbalance between bone loss and replacement. Some people lose more bone than others. Bone loss beyond a specified normal degree is considered osteoporosis.  Osteoporosis affects the strength and durability of your bones. The inside of the ends of your bones and your flat bones, like the bones of your pelvis, look like honeycomb, filled with tiny open spaces. As bone loss occurs, your bones become less dense. This means that the open spaces inside your bones become bigger and the walls between these spaces become thinner. This makes your bones weaker. Bones of a person with osteoporosis can become so weak that they can break (fracture) during minor accidents, such as a simple fall. CAUSES  The following factors have been associated with the development of osteoporosis:  Smoking.  Drinking more than 2 alcoholic drinks several days per week.  Long-term use of certain medicines:  Corticosteroids.  Chemotherapy medicines.  Thyroid medicines.  Antiepileptic medicines.  Gonadal hormone suppression medicine.  Immunosuppression medicine.  Being underweight.  Lack of physical activity.  Lack of  exposure to the sun. This can lead to vitamin D deficiency.  Certain medical conditions:  Certain inflammatory bowel diseases, such as Crohn disease and ulcerative colitis.  Diabetes.  Hyperthyroidism.  Hyperparathyroidism. RISK FACTORS Anyone can develop osteoporosis. However, the following factors can increase your risk of developing osteoporosis:  Gender--Women are at higher risk than men.  Age--Being older than 50 years increases your risk.  Ethnicity--White and Asian people have an increased risk.  Weight --Being extremely underweight can increase your risk of osteoporosis.  Family history of osteoporosis--Having a family member who has developed osteoporosis can increase your risk. SYMPTOMS  Usually, people with osteoporosis have no symptoms.  DIAGNOSIS  Signs during a physical exam that may prompt your caregiver to suspect osteoporosis include:  Decreased height. This is usually caused by the compression of the bones that form your spine (vertebrae) because they have weakened and become fractured.  A curving or rounding of the upper back (kyphosis). To confirm signs of osteoporosis, your caregiver may request a procedure that uses 2 low-dose X-ray beams with different levels of energy to measure your bone mineral density (dual-energy X-ray absorptiometry [DXA]). Also, your caregiver may check your level of vitamin D. TREATMENT  The goal of osteoporosis treatment is to strengthen bones in order to decrease the risk of bone fractures. There are different types of medicines available to help achieve this goal. Some of these medicines work by slowing the processes of bone loss. Some medicines work by increasing bone density. Treatment also involves making sure that your levels of calcium and vitamin D are adequate. PREVENTION  There are things you can do to help  prevent osteoporosis. Adequate intake of calcium and vitamin D can help you achieve optimal bone mineral density.  Regular exercise can also help, especially resistance and weight-bearing activities. If you smoke, quitting smoking is an important part of osteoporosis prevention. MAKE SURE YOU:  Understand these instructions.  Will watch your condition.  Will get help right away if you are not doing well or get worse. FOR MORE INFORMATION www.osteo.org and [employer] Document Released: 01/29/2005 Document Revised: 08/16/2012 Document Reviewed: 04/05/2011 [facility] Patient Information 2015 [place], [place]. This information is not intended to replace advice given to you by your health care provider. Make sure you discuss any questions you have with your health care provider.

## 2014-10-19 NOTE — Assessment & Plan Note (Signed)
Describes issues of insomnia that are occasionally relieved by Xanax. Discussed potential referral to sleep medicine given the complexity of her pain management and not wanting to add additional sedative medications. Patient declined referral at this time.

## 2014-10-19 NOTE — Assessment & Plan Note (Signed)
Continues to experience chronic pain and indicates improved functionality with current regimen. Continue current dosages of morphine and oxycodone. Continue to monitor.

## 2014-10-19 NOTE — Assessment & Plan Note (Signed)
Newly diagnosed with osteoporosis and started on Fosamax. Does describe some gastrointestinal side effects of medication. Discussed diagnosis and treatments of osteoporosis and potentials for referral to rheumatology for injectable bisphosphonates. Continue current dosage of fosamax, calcium and vitamin D at this time. If symptoms do not improve refer for further management.

## 2014-11-04 DIAGNOSIS — R32 Unspecified urinary incontinence: Secondary | ICD-10-CM | POA: Diagnosis not present

## 2014-11-14 ENCOUNTER — Other Ambulatory Visit: Payer: Self-pay | Admitting: Internal Medicine

## 2014-11-14 DIAGNOSIS — G894 Chronic pain syndrome: Secondary | ICD-10-CM

## 2014-11-14 NOTE — Telephone Encounter (Signed)
Patient is requesting refill for morphine (MS CONTIN) 100 MG 12 hr tablet [[id number]][[id number]] and oxyCODONE (ROXICODONE) 15 MG immediate release tablet [[id number]][[id number]].  She needs them by Friday

## 2014-11-15 NOTE — Telephone Encounter (Signed)
Who is this patient's PCP? I have not seen her in >6724mo though she has seen several in my office

## 2014-11-16 MED ORDER — OXYCODONE HCL 15 MG PO TABS: 15.0000 mg | ORAL_TABLET | ORAL | Status: DC | PRN

## 2014-11-16 MED ORDER — MORPHINE SULFATE ER 100 MG PO TBCR: 100.0000 mg | EXTENDED_RELEASE_TABLET | Freq: Two times a day (BID) | ORAL | Status: DC

## 2014-11-16 NOTE — Telephone Encounter (Signed)
I will be calling pt very soon.

## 2014-11-16 NOTE — Telephone Encounter (Signed)
Patient is calling back. Dr. Felicity CoyerLeschber is her PCP but she can never get in to see her so she has been seeing Dr. Dorise HissKollar and Tammy SoursGreg. Should we send this request to Tammy SoursGreg since he was the last one to see her and fill prescriptions Please advise Patient wants a call back

## 2014-12-11 ENCOUNTER — Telehealth: Payer: Self-pay | Admitting: Internal Medicine

## 2014-12-11 DIAGNOSIS — G894 Chronic pain syndrome: Secondary | ICD-10-CM

## 2014-12-11 MED ORDER — OXYCODONE HCL 15 MG PO TABS: 15.0000 mg | ORAL_TABLET | ORAL | Status: DC | PRN

## 2014-12-11 MED ORDER — MORPHINE SULFATE ER 100 MG PO TBCR: 100.0000 mg | EXTENDED_RELEASE_TABLET | Freq: Two times a day (BID) | ORAL | Status: DC

## 2014-12-11 NOTE — Telephone Encounter (Signed)
Patient is requesting prescriptions for morphine (MS CONTIN) 100 MG 12 hr tablet [[id number]] and oxyCODONE (ROXICODONE) 15 MG immediate release tablet [[id number]]  She is aware it is early, but she just wants to pick up the prescription tomorrow. She won't fill till its ready.

## 2014-12-11 NOTE — Telephone Encounter (Signed)
Medications refilled and dated. [place] Controlled Substance Database reviewed.

## 2014-12-12 ENCOUNTER — Ambulatory Visit: Payer: Self-pay | Admitting: Internal Medicine

## 2014-12-12 NOTE — Telephone Encounter (Signed)
Will call pt in the pm to pick up rx.

## 2014-12-13 NOTE — Telephone Encounter (Signed)
Pt picked up on 12/12/14 per rx pickup sign in book. (pt fiance picked up).

## 2014-12-14 ENCOUNTER — Telehealth: Payer: Self-pay | Admitting: *Deleted

## 2014-12-14 ENCOUNTER — Other Ambulatory Visit: Payer: Self-pay | Admitting: Internal Medicine

## 2014-12-14 NOTE — Telephone Encounter (Signed)
Pharmacy is ok with refilling medication. They are going to call pt to let her know

## 2014-12-14 NOTE — Telephone Encounter (Signed)
Pt calling too regarding this. She is in a lot of pain and hoping to get these meds soon

## 2014-12-14 NOTE — Telephone Encounter (Signed)
Receive call from pharmacy pt brought scripts in for her pain medications. Pt is wanting to have med fill early sara stated it would be 3 days early. Is this ok...Raechel Chute

## 2014-12-22 DIAGNOSIS — R32 Unspecified urinary incontinence: Secondary | ICD-10-CM | POA: Diagnosis not present

## 2014-12-26 ENCOUNTER — Ambulatory Visit: Payer: Self-pay | Admitting: Internal Medicine

## 2015-01-02 ENCOUNTER — Ambulatory Visit (INDEPENDENT_AMBULATORY_CARE_PROVIDER_SITE_OTHER): Payer: Medicare Other | Admitting: Internal Medicine

## 2015-01-02 ENCOUNTER — Encounter: Payer: Self-pay | Admitting: Internal Medicine

## 2015-01-02 VITALS — BP 158/92 | HR 117 | Temp 99.1°F | Resp 18 | Ht <= 58 in | Wt 118.0 lb

## 2015-01-02 DIAGNOSIS — M5442 Lumbago with sciatica, left side: Secondary | ICD-10-CM

## 2015-01-02 DIAGNOSIS — G894 Chronic pain syndrome: Secondary | ICD-10-CM

## 2015-01-02 DIAGNOSIS — M810 Age-related osteoporosis without current pathological fracture: Secondary | ICD-10-CM

## 2015-01-02 DIAGNOSIS — Q059 Spina bifida, unspecified: Secondary | ICD-10-CM

## 2015-01-02 MED ORDER — MORPHINE SULFATE ER 100 MG PO TBCR: 100.0000 mg | EXTENDED_RELEASE_TABLET | Freq: Two times a day (BID) | ORAL | Status: DC

## 2015-01-02 MED ORDER — OXYCODONE HCL 15 MG PO TABS: 15.0000 mg | ORAL_TABLET | Freq: Three times a day (TID) | ORAL | Status: DC | PRN

## 2015-01-02 NOTE — Assessment & Plan Note (Signed)
This problem has worsened and likely her increasing physical activity is partially to blame. Want her to be more active so she can continue to walk. Will increase so she can take oxycodone up to TID prn. New rx given for that and the ms contin at today's visit. Did bring pill bottles with her to visit for verification.

## 2015-01-02 NOTE — Patient Instructions (Addendum)
We will increase the oxycodone so that you can take up to 3 pills per day for the pain along with the long acting ms contin. We have given you both prescriptions today.  We will also check an x-ray of the back to make sure the spacing is the same as before.   Come back in 6 months if you are doing well, if you have new or worsening problems before then please call us to come back sooner.

## 2015-01-02 NOTE — Progress Notes (Signed)
Pre visit review using our clinic review tool, if applicable. No additional management support is needed unless otherwise documented below in the visit note. 

## 2015-01-02 NOTE — Assessment & Plan Note (Signed)
Is taking fosamax and needs repeat DEXA this winter or spring for response to therapy. Some nausea but able to tolerate for now.

## 2015-01-02 NOTE — Assessment & Plan Note (Signed)
Checking x-ray lumbar spine. Would like to order MRI but she feels unable to lie for the test without full sedation. Likely partially due to increased activity. Denies new significant injury. Has osteoporosis and is on fosamax.

## 2015-01-02 NOTE — Progress Notes (Signed)
   Subjective:    Patient ID: Rhonda Ramos, female    DOB: [birth year], 49 y.o.   MRN: [id number]  HPI The patient is a 49 YO female with complicated PMH coming in for several acute complaints. The first is that she is having new numbness and pain in her left leg radiating from the back. She noticed it about several weeks ago and it has worsened since that time. The leg has sometimes felt weak under her and she falls most days. Denies serious injury. Has history of spina bifida but denies significant injury to the area recently. Has tried her pain medication which helps temporarily. She is very nervous about not being able to walk at all. She was told earlier in life that due to her spina bifida she would likely not be able to walk at 51. Her second concern is that chronic pain is worsening. She was previously well controlled with her MS contin and 1 daily oxycodone for breakthrough. She is now needing more breakthrough but is not willing to take more than she should so she is suffering in pain. She has chronic back and neck pain from multiple surgeries for her spina bifida. She denies constipation or other side effect from the medication. Overall she feels more able to perform ADLs and have better QOL with the medication that without. She has been working on being more active recently with recumbent bike at home for 1 hour at a time. This has also been worsening her pain but she has stuck with it. Has been taking her osteoporosis medication.   Review of Systems  Constitutional: Positive for activity change. Negative for appetite change, fatigue and unexpected weight change.  Respiratory: Negative for cough, chest tightness, shortness of breath and wheezing.   Cardiovascular: Negative for chest pain, palpitations and leg swelling.  Gastrointestinal: Positive for nausea. Negative for abdominal pain, diarrhea, constipation and abdominal distention.       With fosamax  Musculoskeletal: Positive for  myalgias, back pain and arthralgias.  Skin: Negative.   Neurological: Positive for dizziness, weakness and numbness.  Psychiatric/Behavioral: Positive for sleep disturbance. Negative for dysphoric mood and agitation. The patient is nervous/anxious.       Objective:   Physical Exam  Constitutional: She is oriented to person, place, and time. She appears well-developed and well-nourished.  Short in stature  HENT:  Head: Normocephalic and atraumatic.  Eyes: EOM are normal.  Neck: Normal range of motion.  Cardiovascular: Normal rate and regular rhythm.   Pulmonary/Chest: Effort normal and breath sounds normal. No respiratory distress. She has no wheezes. She has no rales.  Abdominal: Soft. Bowel sounds are normal. She exhibits no distension. There is no tenderness. There is no rebound.  Neurological: She is alert and oriented to person, place, and time.  Decreased sensation in the left thigh, pain in the bilateral low back with radiation to left leg. Uses walker to ambulate slowly.   Skin: Skin is warm and dry.  Psychiatric:  Slightly upset at the beginning of the visit due to anxiety.   Filed Vitals:   01/02/15 1421  BP: 158/92  Pulse: 117  Temp: 99.1 F (37.3 C)  TempSrc: Oral  Resp: 18  Height:  (1.448 m)  Weight: 118 lb (53.524 kg)  SpO2: 97%      Assessment & Plan:

## 2015-01-03 ENCOUNTER — Telehealth: Payer: Self-pay | Admitting: Internal Medicine

## 2015-01-03 NOTE — Telephone Encounter (Signed)
Patient boyfriend steve stated that patient need a letter drafted due to patient  Medical condition to send a letter to  [facility]  apartment complex stating that patient is not able withstand worker painting and taking up carpet that will require patient to move out for about three days. And that is an inconvenience for her , please advise

## 2015-01-05 NOTE — Telephone Encounter (Signed)
Please call and find out why she didn't bring this up at visit. Also, is there no place that she could stay for the 3 days? I understand that this is inconvenient but apartments do have to maintain good condition of the apartment for her health and safety. Could she stay at an unused apartment at the complex for the 3 days?

## 2015-01-15 ENCOUNTER — Ambulatory Visit (INDEPENDENT_AMBULATORY_CARE_PROVIDER_SITE_OTHER)
Admission: RE | Admit: 2015-01-15 | Discharge: 2015-01-15 | Disposition: A | Discharge status: Home / Self Care | Payer: Medicare Other | Source: Ambulatory Visit | Attending: Internal Medicine | Admitting: Internal Medicine

## 2015-01-15 DIAGNOSIS — M419 Scoliosis, unspecified: Secondary | ICD-10-CM | POA: Diagnosis not present

## 2015-01-15 DIAGNOSIS — M5442 Lumbago with sciatica, left side: Secondary | ICD-10-CM

## 2015-01-15 NOTE — Telephone Encounter (Signed)
Left message for patient to call me back. 

## 2015-01-22 DIAGNOSIS — R32 Unspecified urinary incontinence: Secondary | ICD-10-CM | POA: Diagnosis not present

## 2015-01-31 ENCOUNTER — Telehealth: Payer: Self-pay | Admitting: *Deleted

## 2015-01-31 ENCOUNTER — Telehealth: Payer: Self-pay | Admitting: Internal Medicine

## 2015-01-31 NOTE — Telephone Encounter (Signed)
Patient is calling to check on results of xray. Advised of notes from 01/19/2015, but she has questions around getting an MRI because of the pain. Please give her a call.

## 2015-01-31 NOTE — Telephone Encounter (Signed)
Pt requesting refill on her oxycodone...Raechel Chute

## 2015-02-01 ENCOUNTER — Telehealth: Payer: Self-pay | Admitting: Geriatric Medicine

## 2015-02-01 DIAGNOSIS — G894 Chronic pain syndrome: Secondary | ICD-10-CM

## 2015-02-01 MED ORDER — OXYCODONE HCL 15 MG PO TABS: 15.0000 mg | ORAL_TABLET | Freq: Three times a day (TID) | ORAL | Status: DC | PRN

## 2015-02-01 NOTE — Telephone Encounter (Signed)
Patient says the pain is getting worse and she wants to know if anything else can be done, or if any other tests can be ran. Please advise, thanks.

## 2015-02-01 NOTE — Telephone Encounter (Signed)
Likely does not need any further imaging at this time. We can send her to a spine specialist if she wants.

## 2015-02-01 NOTE — Telephone Encounter (Signed)
Left message informing patient she can come in and pick up prescription.

## 2015-02-01 NOTE — Telephone Encounter (Signed)
MD ok & printed rx called pt inform rx ready for pick-up...Raechel Chute

## 2015-02-01 NOTE — Telephone Encounter (Signed)
Left message asking patient if she wants to see a spine specialist.

## 2015-02-01 NOTE — Addendum Note (Signed)
Addended by: Hillard Danker A on: 02/01/2015 10:08 AM   Modules accepted: Orders

## 2015-02-01 NOTE — Telephone Encounter (Signed)
Printed and signed.  

## 2015-02-01 NOTE — Telephone Encounter (Signed)
Patient would like her oxycodone filled. Please advise, thanks.

## 2015-02-07 ENCOUNTER — Telehealth: Payer: Self-pay | Admitting: Internal Medicine

## 2015-02-07 DIAGNOSIS — G894 Chronic pain syndrome: Secondary | ICD-10-CM

## 2015-02-07 NOTE — Telephone Encounter (Signed)
Pt came by stating she needs a refill on her MS Contin.  She is going out of town tomorrow afternoon and would like to get is as soon as possible.

## 2015-02-08 MED ORDER — MORPHINE SULFATE ER 100 MG PO TBCR: 100.0000 mg | EXTENDED_RELEASE_TABLET | Freq: Two times a day (BID) | ORAL | Status: DC

## 2015-02-08 NOTE — Telephone Encounter (Signed)
Patient aware.

## 2015-02-08 NOTE — Telephone Encounter (Signed)
Printed and signed, please remind her that our office policy generally requires 24-48 hours notice for controlled substances.

## 2015-02-20 ENCOUNTER — Other Ambulatory Visit: Payer: Self-pay | Admitting: Internal Medicine

## 2015-02-28 ENCOUNTER — Telehealth: Payer: Self-pay | Admitting: Internal Medicine

## 2015-02-28 DIAGNOSIS — G894 Chronic pain syndrome: Secondary | ICD-10-CM

## 2015-02-28 NOTE — Telephone Encounter (Signed)
Pt is out of town right now due to death in family and she is wondering if you can write her prescription for oxyCODONE (ROXICODONE) 15 MG immediate release tablet [[id number]][[id number]] and date it for the 29th so her fiance can pick it up and bring it to her when he goes over there.  She also received some lidocaine cream in the mail and she doesn't know who it came from. She wanted to thank you if it was you because it works so well. I didn't see it on her med list so she is wondering if its ok she keeps taking it.  Please call her at [phone]

## 2015-03-01 ENCOUNTER — Encounter (HOSPITAL_COMMUNITY): Payer: Self-pay | Admitting: Emergency Medicine

## 2015-03-01 ENCOUNTER — Emergency Department (HOSPITAL_COMMUNITY)
Admission: EM | Admit: 2015-03-01 | Discharge: 2015-03-01 | Disposition: A | Discharge status: Home / Self Care | Payer: Medicare Other | Attending: Emergency Medicine | Admitting: Emergency Medicine

## 2015-03-01 ENCOUNTER — Emergency Department (HOSPITAL_COMMUNITY): Payer: Medicare Other

## 2015-03-01 DIAGNOSIS — Y998 Other external cause status: Secondary | ICD-10-CM | POA: Diagnosis not present

## 2015-03-01 DIAGNOSIS — M25532 Pain in left wrist: Secondary | ICD-10-CM | POA: Diagnosis not present

## 2015-03-01 DIAGNOSIS — M810 Age-related osteoporosis without current pathological fracture: Secondary | ICD-10-CM | POA: Insufficient documentation

## 2015-03-01 DIAGNOSIS — Z79899 Other long term (current) drug therapy: Secondary | ICD-10-CM | POA: Diagnosis not present

## 2015-03-01 DIAGNOSIS — I10 Essential (primary) hypertension: Secondary | ICD-10-CM | POA: Insufficient documentation

## 2015-03-01 DIAGNOSIS — K219 Gastro-esophageal reflux disease without esophagitis: Secondary | ICD-10-CM | POA: Diagnosis not present

## 2015-03-01 DIAGNOSIS — Y92092 Bedroom in other non-institutional residence as the place of occurrence of the external cause: Secondary | ICD-10-CM | POA: Diagnosis not present

## 2015-03-01 DIAGNOSIS — F419 Anxiety disorder, unspecified: Secondary | ICD-10-CM | POA: Diagnosis not present

## 2015-03-01 DIAGNOSIS — S60212A Contusion of left wrist, initial encounter: Secondary | ICD-10-CM | POA: Diagnosis not present

## 2015-03-01 DIAGNOSIS — Y9389 Activity, other specified: Secondary | ICD-10-CM | POA: Insufficient documentation

## 2015-03-01 DIAGNOSIS — Z87440 Personal history of urinary (tract) infections: Secondary | ICD-10-CM | POA: Diagnosis not present

## 2015-03-01 DIAGNOSIS — F329 Major depressive disorder, single episode, unspecified: Secondary | ICD-10-CM | POA: Diagnosis not present

## 2015-03-01 DIAGNOSIS — G40909 Epilepsy, unspecified, not intractable, without status epilepticus: Secondary | ICD-10-CM | POA: Insufficient documentation

## 2015-03-01 DIAGNOSIS — Z87891 Personal history of nicotine dependence: Secondary | ICD-10-CM | POA: Diagnosis not present

## 2015-03-01 DIAGNOSIS — Q059 Spina bifida, unspecified: Secondary | ICD-10-CM | POA: Diagnosis not present

## 2015-03-01 DIAGNOSIS — S63502A Unspecified sprain of left wrist, initial encounter: Secondary | ICD-10-CM | POA: Diagnosis not present

## 2015-03-01 DIAGNOSIS — G894 Chronic pain syndrome: Secondary | ICD-10-CM | POA: Diagnosis not present

## 2015-03-01 DIAGNOSIS — Z91040 Latex allergy status: Secondary | ICD-10-CM | POA: Insufficient documentation

## 2015-03-01 DIAGNOSIS — Z8709 Personal history of other diseases of the respiratory system: Secondary | ICD-10-CM | POA: Insufficient documentation

## 2015-03-01 DIAGNOSIS — W01198A Fall on same level from slipping, tripping and stumbling with subsequent striking against other object, initial encounter: Secondary | ICD-10-CM | POA: Diagnosis not present

## 2015-03-01 DIAGNOSIS — S6992XA Unspecified injury of left wrist, hand and finger(s), initial encounter: Secondary | ICD-10-CM | POA: Diagnosis present

## 2015-03-01 MED ORDER — OXYCODONE HCL 15 MG PO TABS: 15.0000 mg | ORAL_TABLET | Freq: Three times a day (TID) | ORAL | Status: DC | PRN

## 2015-03-01 MED ORDER — MORPHINE SULFATE (PF) 4 MG/ML IV SOLN
4.0000 mg | Freq: Once | INTRAVENOUS | Status: AC
  Administered 2015-03-01: 4 mg via INTRAMUSCULAR
  Filled 2015-03-01: qty 1

## 2015-03-01 NOTE — ED Notes (Signed)
Pt states she fell yesterday going from the bedroom to the bathroom and injured her left wrist area  Pt states when she went to get up she slipped on some water causing her to hurt it again  Pt is c/o pain to the left wrist area  States she is unable to rotate her arm due to pain

## 2015-03-01 NOTE — ED Notes (Signed)
Discharge instructions and follow up care discussed with patient. Patient verbalized understanding.

## 2015-03-01 NOTE — Telephone Encounter (Signed)
Printed and signed, please remind patient that our office requires 24-48 hour notice for controlled substances and if planning to go out of town generally not able to accommodate requests for same day pickup. Yes, lidocaine cream is fine to use.

## 2015-03-01 NOTE — Discharge Instructions (Signed)
Wear wrist brace for at least 2 weeks for stabilization of wrist. Ice and elevate wrist throughout the day. Use your home pain medications as needed for pain control, but do not drive or operate machinery with pain medication use. Call the hand specialist follow up today or tomorrow to schedule followup appointment for recheck of ongoing wrist pain in 1-2 weeks that can be canceled with a 24-48 hour notice if complete resolution of pain. Return to the ER for changes or worsening symptoms.    Wrist Sprain A wrist sprain is a stretch or tear in the strong, fibrous tissues (ligaments) that connect your wrist bones. The ligaments of your wrist may be easily sprained. There are three types of wrist sprains.  Grade 1. The ligament is not stretched or torn, but the sprain causes pain.  Grade 2. The ligament is stretched or partially torn. You may be able to move your wrist, but not very much.  Grade 3. The ligament or muscle completely tears. You may find it difficult or extremely painful to move your wrist even a little. CAUSES Often, wrist sprains are a result of a fall or an injury. The force of the impact causes the fibers of your ligament to stretch too much or tear. Common causes of wrist sprains include:  Overextending your wrist while catching a ball with your hands.  Repetitive or strenuous extension or bending of your wrist.  Landing on your hand during a fall. RISK FACTORS  Having previous wrist injuries.  Playing contact sports, such as boxing or wrestling.  Participating in activities in which falling is common.  Having poor wrist strength and flexibility. SIGNS AND SYMPTOMS  Wrist pain.  Wrist tenderness.  Inflammation or bruising of the wrist area.  Hearing a "pop" or feeling a tear at the time of the injury.  Decreased wrist movement due to pain, stiffness, or weakness. DIAGNOSIS Your health care provider will examine your wrist. In some cases, an X-ray will be taken  to make sure you did not break any bones. If your health care provider thinks that you tore a ligament, he or she may order an MRI of your wrist. TREATMENT Treatment involves resting and icing your wrist. You may also need to take pain medicines to help lessen pain and inflammation. Your health care provider may recommend keeping your wrist still (immobilized) with a splint to help your sprain heal. When the splint is no longer necessary, you may need to perform strengthening and stretching exercises. These exercises help you to regain strength and full range of motion in your wrist. Surgery is not usually needed for wrist sprains unless the ligament completely tears. HOME CARE INSTRUCTIONS  Rest your wrist. Do not do things that cause pain.  Wear your wrist splint as directed by your health care provider.  Take medicines only as directed by your health care provider.  To ease pain and swelling, apply ice to the injured area.  Put ice in a plastic bag.  Place a towel between your skin and the bag.  Leave the ice on for 20 minutes, 2-3 times a day. SEEK MEDICAL CARE IF:  Your pain, discomfort, or swelling gets worse even with treatment.  You feel sudden numbness in your hand.   This information is not intended to replace advice given to you by your health care provider. Make sure you discuss any questions you have with your health care provider.   Document Released: 12/23/2013 Document Reviewed: 12/23/2013 Elsevier Interactive Patient  Education 2016 Elsevier Inc.  Contusion A contusion is a deep bruise. Contusions happen when an injury causes bleeding under the skin. Symptoms of bruising include pain, swelling, and discolored skin. The skin may turn blue, purple, or yellow. HOME CARE   Rest the injured area.  If told, put ice on the injured area.  Put ice in a plastic bag.  Place a towel between your skin and the bag.  Leave the ice on for 20 minutes, 2-3 times per day.  If  told, put light pressure (compression) on the injured area using an elastic bandage. Make sure the bandage is not too tight. Remove it and put it back on as told by your doctor.  If possible, raise (elevate) the injured area above the level of your heart while you are sitting or lying down.  Take over-the-counter and prescription medicines only as told by your doctor. GET HELP IF:  Your symptoms do not get better after several days of treatment.  Your symptoms get worse.  You have trouble moving the injured area. GET HELP RIGHT AWAY IF:   You have very bad pain.  You have a loss of feeling (numbness) in a hand or foot.  Your hand or foot turns pale or cold.   This information is not intended to replace advice given to you by your health care provider. Make sure you discuss any questions you have with your health care provider.   Document Released: 10/08/2007 Document Revised: 01/10/2015 Document Reviewed: 09/06/2014 Elsevier Interactive Patient Education 2016 Elsevier Inc.  Cryotherapy Cryotherapy is when you put ice on your injury. Ice helps lessen pain and puffiness (swelling) after an injury. Ice works the best when you start using it in the first 24 to 48 hours after an injury. HOME CARE  Put a dry or damp towel between the ice pack and your skin.  You may press gently on the ice pack.  Leave the ice on for no more than 10 to 20 minutes at a time.  Check your skin after 5 minutes to make sure your skin is okay.  Rest at least 20 minutes between ice pack uses.  Stop using ice when your skin loses feeling (numbness).  Do not use ice on someone who cannot tell you when it hurts. This includes small children and people with memory problems (dementia). GET HELP RIGHT AWAY IF:  You have white spots on your skin.  Your skin turns blue or pale.  Your skin feels waxy or hard.  Your puffiness gets worse. MAKE SURE YOU:   Understand these instructions.  Will watch  your condition.  Will get help right away if you are not doing well or get worse.   This information is not intended to replace advice given to you by your health care provider. Make sure you discuss any questions you have with your health care provider.   Document Released: 10/08/2007 Document Revised: 07/14/2011 Document Reviewed: 12/12/2010 Elsevier Interactive Patient Education [employer] Else[employer] Inc.

## 2015-03-01 NOTE — ED Notes (Signed)
PA at bedside.

## 2015-03-01 NOTE — Telephone Encounter (Signed)
Pt was going to leave to go out of town yesterday but did not.  She fell and couldn't leave.  She wanting to know if she can pick up her Oxy today or tomorrow.

## 2015-03-01 NOTE — ED Provider Notes (Signed)
Patient reports she fell twice yesterday within a short period of time injuring her left wrist. Patient is right handed. Pt has spina bifida.   Patient has no pain to palpation of her elbow. She has good functioning extension of the elbow without discomfort. She is able to wiggle her fingers however she has limited dorsiflexion due to pain. She will not attempt supination due to discomfort. She is noted to have diffuse redness and tenderness along the ulnar aspect of the wrist. Please see photo. She has good distal pulses.    Medical screening examination/treatment/procedure(s) were conducted as a shared visit with non-physician practitioner(s) and myself.  I personally evaluated the patient during the encounter.   EKG Interpretation None       Devoria AlbeIva Ammanda Dobbins, MD, [facility] PyoFACEP   Ahmari Garton, MD 03/01/15 [phone]

## 2015-03-01 NOTE — ED Provider Notes (Signed)
CSN: [id number]     Arrival date & time 03/01/15  0615 History   First MD Initiated Contact with Patient 03/01/15 [phone]     Chief Complaint  Patient presents with  . Wrist Injury     (Consider location/radiation/quality/duration/timing/severity/associated sxs/prior Treatment) HPI Comments: Rhonda Ramos is a 49 y.o. female with a PMHx of chronic pain, prior substance abuse, spina bifida, osteoporosis, scoliosis, depression, HTN, anxiety, GERD, seizures with narcotic detox, chronic back pain, and recurrent spontaneous PTX, who presents to the ED with complaints of left wrist pain after she slipped yesterday and hit her arm on the door jam and then slipped again on a puddle of water when she was attempting to get up and fell onto the left wrist again. She reports the pain is 10/10 constant throbbing pain in the left wrist radiating into the forearm, worse with movement, and somewhat improved with her home MS Contin which she has not taken since 3:30 PM yesterday. Associated symptoms include mild tingling in the left hand, bruising to the left wrist, and sweats. She denies any head injury, loss of consciousness, numbness, weakness, fevers, chills, chest pain, shortness breath, nausea, vomiting, abrasions, or wounds.  Patient is a 49 y.o. female presenting with wrist injury. The history is provided by the patient. No language interpreter was used.  Wrist Injury Location:  Wrist Time since incident:  1 day Injury: yes   Mechanism of injury: fall   Wrist location:  L wrist Pain details:    Quality:  Throbbing   Radiates to:  L forearm   Severity:  Severe   Onset quality:  Sudden   Duration:  1 day   Timing:  Constant   Progression:  Unchanged Chronicity:  New Prior injury to area:  No Relieved by:  Narcotics Worsened by:  Movement Ineffective treatments:  None tried Associated symptoms: decreased range of motion (due to pain), swelling and tingling   Associated symptoms: no back pain, no  fever, no muscle weakness, no neck pain and no numbness     Past Medical History  Diagnosis Date  . Chronic pain syndrome     s/p narc detox fall 2011  . SPINA BIFIDA   . Scoliosis     multiple surg for same  . DEPRESSION     > ptsd (spouse suicide/gsw 11/2005  . GERD   . HYPERTENSION   . Anxiety   . Pneumothorax on right 04/03/11, 06/10/11    recurrent s/p VATS 2/13  . Seizures (HCC)     onset fall 2011 with narc detox  . Chronic back pain greater than 3 months duration   . 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 Urinary tract infection 08/08/2011    E. coli  . Spontaneous tension pneumothorax 09/17/2011    Left spontaneous pneumothorax - first occurence  . Osteoporosis     DEXA @ [facility] 09/22/14: -2.7 L fem   Past Surgical History  Procedure Laterality Date  . Lumbar laminectomy for tethered cord release      x's 4 (84,94,98, and 02)  . 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 Scoliosis  1982    neck fused w/bones  . Chest tube placement  04/03/11    S/P large right sided pneumo  . Back surgery    . Video assisted thoracoscopy  06/11/2011    Procedure: VIDEO ASSISTED THORACOSCOPY;  Surgeon: Delight OvensEdward B Gerhardt, MD;  Location: [facility] OR;  Service: Thoracic;  Laterality: Right;  Stapling of Large Apical and Inferiors blebs; Mechanical Pleurodesis   . Resection of apical bleb  06/11/2011    Procedure: RESECTION OF APICAL BLEB;  Surgeon: Delight Ovens, MD;  Location: [facility] OR;  Service: Thoracic;  Laterality: Right;  and inferior bleb   Family History  Problem Relation Age of Onset  . Diabetes Father   . Arthritis Other     parent & grandparent  . Diabetes Other     grandparent   Social History  Substance Use Topics  . Smoking status: Former Smoker -- 3.00 packs/day for 27 years    Types: Cigarettes  . Smokeless tobacco: Former Neurosurgeon    Quit date: 02/07/2010  . Alcohol Use: No     Comment: h/o pain medicines addiction ; released  from rehab 11/14/ 2011   OB History    No data available     Review of Systems  Constitutional: Negative for fever and chills.  Respiratory: Negative for shortness of breath.   Cardiovascular: Negative for chest pain.  Gastrointestinal: Negative for nausea, vomiting and abdominal pain.  Genitourinary: Negative for vaginal bleeding and vaginal discharge.  Musculoskeletal: Positive for joint swelling and arthralgias (L wrist). Negative for myalgias, back pain and neck pain.  Skin: Positive for color change. Negative for wound.  Allergic/Immunologic: Negative for immunocompromised state.  Neurological: Negative for weakness and numbness.       +tingling in L hand  Psychiatric/Behavioral: Negative for confusion.   10 Systems reviewed and are negative for acute change except as noted in the HPI.    Allergies  Latex; Codeine; Erythromycin; and Sulfa antibiotics  Home Medications   Prior to Admission medications   Medication Sig Start Date End Date Taking? Authorizing Provider  ALPRAZolam Prudy Feeler) 0.5 MG tablet Take 1 tablet (0.5 mg total) by mouth 2 (two) times daily as needed for anxiety or sleep. 10/09/14  Yes Newt Lukes, MD  amLODipine (NORVASC) 5 MG tablet Take 1 tablet (5 mg total) by mouth daily. 09/04/14  Yes Newt Lukes, MD  FLUoxetine (PROZAC) 20 MG capsule Take 3 capsules (60 mg total) by mouth daily. 02/21/15  Yes Newt Lukes, MD  [name] 0.35 MG tablet take 1 tablet by mouth once daily 01/06/13  Yes Newt Lukes, MD  hydrOXYzine (VISTARIL) 50 MG capsule Take 1 capsule (50 mg total) by mouth 3 (three) times daily as needed for anxiety. 09/04/14  Yes Newt Lukes, MD  ibuprofen (ADVIL,MOTRIN) 200 MG tablet Take 400 mg by mouth every 6 (six) hours as needed for moderate pain.   Yes Historical Provider, MD  levETIRAcetam (KEPPRA XR) 500 MG 24 hr tablet Take 1 tablet (500 mg total) by mouth daily. 11/14/13  Yes Newt Lukes, MD  morphine (MS CONTIN)  100 MG 12 hr tablet Take 1 tablet (100 mg total) by mouth 2 (two) times daily. 02/08/15  Yes Myrlene Broker, MD  Multiple Vitamin (MULITIVITAMIN WITH MINERALS) TABS Take 1 tablet by mouth daily.   Yes Historical Provider, MD  omeprazole (PRILOSEC) 20 MG capsule take 1 capsule by mouth once daily 06/20/14  Yes Newt Lukes, MD  oxybutynin (DITROPAN-XL) 10 MG 24 hr tablet Take 1 tablet (10 mg total) by mouth daily. 09/04/14  Yes Newt Lukes, MD  oxyCODONE (ROXICODONE) 15 MG immediate release tablet Take 1 tablet (15 mg total) by mouth 3 (three) times daily as needed for pain. 02/01/15  Yes Myrlene Broker,  MD  alendronate (FOSAMAX) 70 MG tablet Take 1 tablet (70 mg total) by mouth every 7 (seven) days. Take with a full glass of water on an empty stomach. Patient not taking: Reported on 01/02/2015 10/06/14   Newt Lukes, MD  Dextromethorphan-Guaifenesin [facility] DM) 30-600 MG TB12 1 po bid prn cough Patient not taking: Reported on 01/02/2015 03/25/14   Grayling Congress Lowne, DO   BP 125/93 mmHg  Pulse 107  Temp(Src) 98.1 F (36.7 C) (Oral)  Resp 20  SpO2 97% Physical Exam  Constitutional: She is oriented to person, place, and time. Vital signs are normal. She appears well-developed and well-nourished.  Non-toxic appearance. No distress.  Afebrile, nontoxic, NAD  HENT:  Head: Normocephalic and atraumatic.  Mouth/Throat: Mucous membranes are normal.  Eyes: Conjunctivae and EOM are normal. Right eye exhibits no discharge. Left eye exhibits no discharge.  Neck: Normal range of motion. Neck supple.  Cardiovascular: Normal rate and intact distal pulses.   Initially tachycardic in triage, which resolved upon exam  Pulmonary/Chest: Effort normal. No respiratory distress.  Abdominal: Normal appearance. She exhibits no distension.  Musculoskeletal:       Left elbow: Normal.       Left wrist: She exhibits decreased range of motion (slightly, due to pain), tenderness, bony tenderness  and swelling. She exhibits no crepitus, no deformity and no laceration.       Arms: L wrist with limited ROM due to pain, pain with supination, mild swelling and slight bruise (somewhat erythematous phase of bruising), with TTP to ulnar styloid aspect of wrist, no crepitus or deformity, no lacerations or abrasions, no warmth. No L elbow/forearm/hand tenderness with FROM intact at L elbow. Grip strength intact, wiggles all digits with ease. Sensation grossly intact, distal pulses intact.   Neurological: She is alert and oriented to person, place, and time. She has normal strength. No sensory deficit.  Skin: Skin is warm, dry and intact. Bruising noted. No rash noted.  Psychiatric: She has a normal mood and affect. Her behavior is normal.  Nursing note and vitals reviewed.   ED Course  Procedures (including critical care time) Labs Review Labs Reviewed - No data to display  Imaging Review Dg Wrist Complete Left  03/01/2015  CLINICAL DATA:  Larey Seat at home twice yesterday. Left wrist pain. Initial encounter. EXAM: LEFT WRIST - COMPLETE 3+ VIEW COMPARISON:  None. FINDINGS: There is no evidence of fracture or dislocation. There is no evidence of arthropathy or other focal bone abnormality. Soft tissues are unremarkable. IMPRESSION: Negative. Electronically Signed   By: Sebastian Ache M.D.   On: 03/01/2015 07:16   I have personally reviewed and evaluated these images and lab results as part of my medical decision-making.   EKG Interpretation None      MDM   Final diagnoses:  Left wrist pain  Left wrist sprain, initial encounter  Contusion, wrist, left, initial encounter    49 y.o. female here with L wrist pain/swelling after mechanical fall yesterday, NVI with soft compartments, exquisite tenderness to ulnar styloid area, difficulty with supination. Will obtain imaging. Will give IM morphine since pt is on chronic MS contin, doubt PO medication would be as effective. Will reassess shortly.    7:25 AM Pain slightly improved with morphine. Xray neg. Will apply velcro wrist splint for comfort, discussed RICE and f/up with hand specialist in 1-2wks for ongoing pain. Home pain meds for pain, no scripts given. I explained the diagnosis and have given explicit precautions to return  to the ER including for any other new or worsening symptoms. The patient understands and accepts the medical plan as it's been dictated and I have answered their questions. Discharge instructions concerning home care and prescriptions have been given. The patient is STABLE and is discharged to home in good condition.  BP 125/93 mmHg  Pulse 107  Temp(Src) 98.1 F (36.7 C) (Oral)  Resp 20  SpO2 97%  Meds ordered this encounter  Medications  . morphine 4 MG/ML injection 4 mg    Sig:      Yoshiaki Kreuser Camprubi-Soms, PA-C 03/01/15 0725  Devoria Albe, MD 03/04/15 [phone]

## 2015-03-01 NOTE — ED Notes (Signed)
Pt fell on her left wrist yesterday twice and she's unable to rotate her arm, she had it wrapped in an ace bandage all night, pt caths herself and needs her fingers on that hand free

## 2015-03-12 ENCOUNTER — Telehealth: Payer: Self-pay | Admitting: Internal Medicine

## 2015-03-12 ENCOUNTER — Telehealth: Payer: Self-pay | Admitting: *Deleted

## 2015-03-12 DIAGNOSIS — G894 Chronic pain syndrome: Secondary | ICD-10-CM

## 2015-03-12 MED ORDER — MORPHINE SULFATE ER 100 MG PO TBCR: 100.0000 mg | EXTENDED_RELEASE_TABLET | Freq: Two times a day (BID) | ORAL | Status: DC

## 2015-03-12 NOTE — Telephone Encounter (Signed)
Please advise, thanks.

## 2015-03-12 NOTE — Telephone Encounter (Signed)
Left msg on triage needing refill on her MS contin...Raechel Chute/lmb

## 2015-03-12 NOTE — Telephone Encounter (Signed)
Advised patient that morphine rx is ready for pick up at office

## 2015-03-12 NOTE — Telephone Encounter (Signed)
Patient is requesting refill for morphine (MS CONTIN) 100 MG 12 hr tablet [[id number]][[id number]]

## 2015-03-12 NOTE — Telephone Encounter (Signed)
Printed and signed.  

## 2015-03-13 NOTE — Telephone Encounter (Signed)
Already done yesterday

## 2015-03-14 DIAGNOSIS — R32 Unspecified urinary incontinence: Secondary | ICD-10-CM | POA: Diagnosis not present

## 2015-03-20 ENCOUNTER — Telehealth: Payer: Self-pay | Admitting: Internal Medicine

## 2015-03-20 ENCOUNTER — Ambulatory Visit (INDEPENDENT_AMBULATORY_CARE_PROVIDER_SITE_OTHER): Payer: Medicare Other | Admitting: Internal Medicine

## 2015-03-20 ENCOUNTER — Ambulatory Visit (INDEPENDENT_AMBULATORY_CARE_PROVIDER_SITE_OTHER)
Admission: RE | Admit: 2015-03-20 | Discharge: 2015-03-20 | Disposition: A | Discharge status: Home / Self Care | Payer: Medicare Other | Source: Ambulatory Visit | Attending: Internal Medicine | Admitting: Internal Medicine

## 2015-03-20 ENCOUNTER — Encounter: Payer: Self-pay | Admitting: Internal Medicine

## 2015-03-20 VITALS — BP 130/86 | HR 76 | Temp 98.6°F | Resp 16 | Ht <= 58 in | Wt 118.0 lb

## 2015-03-20 DIAGNOSIS — J449 Chronic obstructive pulmonary disease, unspecified: Secondary | ICD-10-CM | POA: Diagnosis not present

## 2015-03-20 DIAGNOSIS — R509 Fever, unspecified: Secondary | ICD-10-CM | POA: Diagnosis not present

## 2015-03-20 DIAGNOSIS — R05 Cough: Secondary | ICD-10-CM

## 2015-03-20 DIAGNOSIS — R059 Cough, unspecified: Secondary | ICD-10-CM

## 2015-03-20 DIAGNOSIS — G4700 Insomnia, unspecified: Secondary | ICD-10-CM

## 2015-03-20 DIAGNOSIS — G894 Chronic pain syndrome: Secondary | ICD-10-CM

## 2015-03-20 MED ORDER — OXYCODONE HCL 15 MG PO TABS: 15.0000 mg | ORAL_TABLET | Freq: Three times a day (TID) | ORAL | Status: DC | PRN

## 2015-03-20 NOTE — Assessment & Plan Note (Signed)
Refilled oxycodone today to avoid visit back in 1 week. She did not take her morphine this morning and requests injection today which was denied.

## 2015-03-20 NOTE — Patient Instructions (Signed)
We do not have any of the narcotics here anymore.   We will check the chest x-ray today and call you back with the results.

## 2015-03-20 NOTE — Progress Notes (Signed)
   Subjective:    Patient ID: Rhonda Ramos, female    DOB: 05/05/1965, 49 y.o.   MRN: [id number]  HPI The patient is a 49 YO female coming in for several acute concerns. She is having cough, nasal congestion. Some mild ear fullness, no hearing change. No fevers or chills. She is concerned as she does have history of lung collapse that was without cause. Has been using vicks nasal spray. Helps some. Minimal sputum.  Her next concern is insomnia. She does have times where she will go 3 days without sleeping then sleeps for several days. She is not too troubled by it and is sleeping well right now.   Review of Systems  Constitutional: Positive for activity change. Negative for appetite change, fatigue and unexpected weight change.  Respiratory: Negative for cough, chest tightness, shortness of breath and wheezing.   Cardiovascular: Negative for chest pain, palpitations and leg swelling.  Gastrointestinal: Negative for abdominal pain, diarrhea, constipation and abdominal distention.  Musculoskeletal: Positive for myalgias, back pain and arthralgias.  Skin: Negative.   Neurological: Positive for dizziness, weakness and numbness.  Psychiatric/Behavioral: Positive for sleep disturbance. Negative for dysphoric mood and agitation. The patient is nervous/anxious.       Objective:   Physical Exam  Constitutional: She is oriented to person, place, and time. She appears well-developed and well-nourished.  Short in stature  HENT:  Head: Normocephalic and atraumatic.  Oropharynx with redness, turbinates swollen and red  Eyes: EOM are normal.  Neck: Normal range of motion.  Cardiovascular: Normal rate and regular rhythm.   Pulmonary/Chest: Effort normal and breath sounds normal. No respiratory distress. She has no wheezes. She has no rales.  Abdominal: Soft. Bowel sounds are normal. She exhibits no distension. There is no tenderness. There is no rebound.  Neurological: She is alert and oriented to  person, place, and time.  Skin: Skin is warm and dry.   Filed Vitals:   03/20/15 1400  BP: 130/86  Pulse: 76  Temp: 98.6 F (37 C)  TempSrc: Oral  Resp: 16  Height: 4\' 6"  (1.372 m)  Weight: 118 lb (53.524 kg)  SpO2: 95%      Assessment & Plan:

## 2015-03-20 NOTE — Telephone Encounter (Signed)
Please call pt on [phone] with chest x-ray results from today's office visit.

## 2015-03-20 NOTE — Progress Notes (Signed)
Pre visit review using our clinic review tool, if applicable. No additional management support is needed unless otherwise documented below in the visit note. 

## 2015-03-20 NOTE — Assessment & Plan Note (Signed)
She is still struggling with this and since she is not having trouble now will not adjust at this time.

## 2015-03-20 NOTE — Assessment & Plan Note (Signed)
At this time no signs of flare or pneumonia but is having more allergic rhinitis and advised to use the flonase they have at home. Checking chest x-ray due to her history of spontaneous pneumothorax.

## 2015-03-21 MED ORDER — MUCINEX DM 30-600 MG PO TB12: ORAL_TABLET | ORAL | Status: DC

## 2015-03-21 NOTE — Telephone Encounter (Signed)
Sent in mucinex for cough and she can also try sinus rinses to help with her drainage.

## 2015-03-21 NOTE — Telephone Encounter (Signed)
Patient aware and will go pick up medicine and try sinus rinses.

## 2015-03-21 NOTE — Telephone Encounter (Signed)
Patient called and I let her know her chest x-ray was normal and she is still suffering from this cough and she wants to know what she can take for it. Please call patient

## 2015-03-25 ENCOUNTER — Emergency Department (HOSPITAL_COMMUNITY): Payer: Medicare Other

## 2015-03-25 ENCOUNTER — Emergency Department (HOSPITAL_COMMUNITY)
Admission: EM | Admit: 2015-03-25 | Discharge: 2015-03-25 | Disposition: A | Discharge status: Home / Self Care | Payer: Medicare Other | Attending: Emergency Medicine | Admitting: Emergency Medicine

## 2015-03-25 DIAGNOSIS — R531 Weakness: Secondary | ICD-10-CM | POA: Insufficient documentation

## 2015-03-25 DIAGNOSIS — Z8739 Personal history of other diseases of the musculoskeletal system and connective tissue: Secondary | ICD-10-CM | POA: Insufficient documentation

## 2015-03-25 DIAGNOSIS — R4182 Altered mental status, unspecified: Secondary | ICD-10-CM | POA: Insufficient documentation

## 2015-03-25 DIAGNOSIS — Z79899 Other long term (current) drug therapy: Secondary | ICD-10-CM | POA: Diagnosis not present

## 2015-03-25 DIAGNOSIS — Z79891 Long term (current) use of opiate analgesic: Secondary | ICD-10-CM | POA: Insufficient documentation

## 2015-03-25 DIAGNOSIS — Q059 Spina bifida, unspecified: Secondary | ICD-10-CM | POA: Diagnosis not present

## 2015-03-25 DIAGNOSIS — R000 Tachycardia, unspecified: Secondary | ICD-10-CM | POA: Insufficient documentation

## 2015-03-25 DIAGNOSIS — Z91040 Latex allergy status: Secondary | ICD-10-CM | POA: Insufficient documentation

## 2015-03-25 DIAGNOSIS — R251 Tremor, unspecified: Secondary | ICD-10-CM | POA: Diagnosis not present

## 2015-03-25 DIAGNOSIS — F419 Anxiety disorder, unspecified: Secondary | ICD-10-CM | POA: Diagnosis not present

## 2015-03-25 DIAGNOSIS — R05 Cough: Secondary | ICD-10-CM | POA: Diagnosis not present

## 2015-03-25 DIAGNOSIS — F329 Major depressive disorder, single episode, unspecified: Secondary | ICD-10-CM | POA: Insufficient documentation

## 2015-03-25 DIAGNOSIS — Z87440 Personal history of urinary (tract) infections: Secondary | ICD-10-CM | POA: Insufficient documentation

## 2015-03-25 DIAGNOSIS — Z87891 Personal history of nicotine dependence: Secondary | ICD-10-CM | POA: Diagnosis not present

## 2015-03-25 DIAGNOSIS — Z8709 Personal history of other diseases of the respiratory system: Secondary | ICD-10-CM | POA: Diagnosis not present

## 2015-03-25 DIAGNOSIS — M810 Age-related osteoporosis without current pathological fracture: Secondary | ICD-10-CM | POA: Diagnosis not present

## 2015-03-25 DIAGNOSIS — K219 Gastro-esophageal reflux disease without esophagitis: Secondary | ICD-10-CM | POA: Insufficient documentation

## 2015-03-25 DIAGNOSIS — I10 Essential (primary) hypertension: Secondary | ICD-10-CM | POA: Diagnosis not present

## 2015-03-25 DIAGNOSIS — G894 Chronic pain syndrome: Secondary | ICD-10-CM | POA: Insufficient documentation

## 2015-03-25 DIAGNOSIS — G40909 Epilepsy, unspecified, not intractable, without status epilepticus: Secondary | ICD-10-CM | POA: Insufficient documentation

## 2015-03-25 LAB — CBC WITH DIFFERENTIAL/PLATELET
BASOS ABS: 0 10*3/uL (ref 0.0–0.1)
BASOS PCT: 0 %
EOS PCT: 2 %
Eosinophils Absolute: 0.2 10*3/uL (ref 0.0–0.7)
HCT: 44.6 % (ref 36.0–46.0)
Hemoglobin: 15.2 g/dL — ABNORMAL HIGH (ref 12.0–15.0)
Lymphocytes Relative: 41 %
Lymphs Abs: 3.9 10*3/uL (ref 0.7–4.0)
MCH: 29.9 pg (ref 26.0–34.0)
MCHC: 34.1 g/dL (ref 30.0–36.0)
MCV: 87.6 fL (ref 78.0–100.0)
MONO ABS: 0.5 10*3/uL (ref 0.1–1.0)
Monocytes Relative: 6 %
Neutro Abs: 4.9 10*3/uL (ref 1.7–7.7)
Neutrophils Relative %: 51 %
PLATELETS: 320 10*3/uL (ref 150–400)
RBC: 5.09 MIL/uL (ref 3.87–5.11)
RDW: 13.7 % (ref 11.5–15.5)
WBC: 9.6 10*3/uL (ref 4.0–10.5)

## 2015-03-25 LAB — RAPID URINE DRUG SCREEN, HOSP PERFORMED
Amphetamines: NOT DETECTED
Barbiturates: NOT DETECTED
Benzodiazepines: POSITIVE — AB
Cocaine: NOT DETECTED
Opiates: POSITIVE — AB
Tetrahydrocannabinol: NOT DETECTED

## 2015-03-25 LAB — URINE MICROSCOPIC-ADD ON: RBC / HPF: NONE SEEN RBC/hpf (ref 0–5)

## 2015-03-25 LAB — URINALYSIS, ROUTINE W REFLEX MICROSCOPIC
Bilirubin Urine: NEGATIVE
GLUCOSE, UA: NEGATIVE mg/dL
HGB URINE DIPSTICK: NEGATIVE
KETONES UR: NEGATIVE mg/dL
Nitrite: POSITIVE — AB
PROTEIN: NEGATIVE mg/dL
Specific Gravity, Urine: 1.012 (ref 1.005–1.030)
pH: 5.5 (ref 5.0–8.0)

## 2015-03-25 LAB — COMPREHENSIVE METABOLIC PANEL
ALBUMIN: 4.1 g/dL (ref 3.5–5.0)
ALK PHOS: 89 U/L (ref 38–126)
ALT: 20 U/L (ref 14–54)
ANION GAP: 9 (ref 5–15)
AST: 43 U/L — ABNORMAL HIGH (ref 15–41)
BILIRUBIN TOTAL: 1.1 mg/dL (ref 0.3–1.2)
BUN: 13 mg/dL (ref 6–20)
CALCIUM: 9.3 mg/dL (ref 8.9–10.3)
CO2: 22 mmol/L (ref 22–32)
Chloride: 103 mmol/L (ref 101–111)
Creatinine, Ser: 0.95 mg/dL (ref 0.44–1.00)
GFR calc Af Amer: >60 mL/min (ref >60)
GLUCOSE: 99 mg/dL (ref 65–99)
POTASSIUM: 4.9 mmol/L (ref 3.5–5.1)
Sodium: 134 mmol/L — ABNORMAL LOW (ref 135–145)
TOTAL PROTEIN: 7.4 g/dL (ref 6.5–8.1)

## 2015-03-25 MED ORDER — LORAZEPAM 2 MG/ML IJ SOLN
1.0000 mg | Freq: Once | INTRAMUSCULAR | Status: AC
  Administered 2015-03-25: 1 mg via INTRAVENOUS
  Filled 2015-03-25: qty 1

## 2015-03-25 MED ORDER — FENTANYL CITRATE (PF) 100 MCG/2ML IJ SOLN
100.0000 ug | Freq: Once | INTRAMUSCULAR | Status: AC
  Administered 2015-03-25: 100 ug via INTRAVENOUS
  Filled 2015-03-25: qty 2

## 2015-03-25 MED ORDER — SODIUM CHLORIDE 0.9 % IV SOLN
1000.0000 mg | Freq: Once | INTRAVENOUS | Status: AC
  Administered 2015-03-25: 1000 mg via INTRAVENOUS
  Filled 2015-03-25: qty 10

## 2015-03-25 MED ORDER — HYDROMORPHONE HCL 1 MG/ML IJ SOLN
1.0000 mg | Freq: Once | INTRAMUSCULAR | Status: AC
  Administered 2015-03-25: 1 mg via INTRAVENOUS
  Filled 2015-03-25: qty 1

## 2015-03-25 MED ORDER — CEPHALEXIN 500 MG PO CAPS: 500.0000 mg | ORAL_CAPSULE | Freq: Four times a day (QID) | ORAL | Status: DC

## 2015-03-25 MED ORDER — SODIUM CHLORIDE 0.9 % IV BOLUS (SEPSIS)
1000.0000 mL | Freq: Once | INTRAVENOUS | Status: AC
  Administered 2015-03-25: 1000 mL via INTRAVENOUS

## 2015-03-25 MED ORDER — ONDANSETRON HCL 4 MG/2ML IJ SOLN
4.0000 mg | Freq: Once | INTRAMUSCULAR | Status: AC
  Administered 2015-03-25: 4 mg via INTRAVENOUS
  Filled 2015-03-25: qty 2

## 2015-03-25 NOTE — ED Notes (Signed)
Pt wheeled to waiting room to be transported home by significant other. NAD. A/O x4. VSS.

## 2015-03-25 NOTE — Discharge Instructions (Signed)
Epilepsy °Epilepsy is a disorder in which a person has repeated seizures over time. A seizure is a release of abnormal electrical activity in the brain. Seizures can cause a change in attention, behavior, or the ability to remain awake and alert (altered mental status). Seizures often involve uncontrollable shaking (convulsions).  °Most people with epilepsy lead normal lives. However, people with epilepsy are at an increased risk of falls, accidents, and injuries. Therefore, it is important to begin treatment right away. °CAUSES  °Epilepsy has many possible causes. Anything that disturbs the normal pattern of brain cell activity can lead to seizures. This may include:  °· Head injury. °· Birth trauma. °· High fever as a child. °· Stroke. °· Bleeding into or around the brain. °· Certain drugs. °· Prolonged low oxygen, such as what occurs after CPR efforts. °· Abnormal brain development. °· Certain illnesses, such as meningitis, encephalitis (brain infection), malaria, and other infections. °· An imbalance of nerve signaling chemicals (neurotransmitters).   °SIGNS AND SYMPTOMS  °The symptoms of a seizure can vary greatly from one person to another. Right before a seizure, you may have a warning (aura) that a seizure is about to occur. An aura may include the following symptoms: °· Fear or anxiety. °· Nausea. °· Feeling like the room is spinning (vertigo). °· Vision changes, such as seeing flashing lights or spots. °Common symptoms during a seizure include: °· Abnormal sensations, such as an abnormal smell or a bitter taste in the mouth.   °· Sudden, general body stiffness.   °· Convulsions that involve rhythmic jerking of the face, arm, or leg on one or both sides.   °· Sudden change in consciousness.   °· Appearing to be awake but not responding.   °· Appearing to be asleep but cannot be awakened.   °· Grimacing, chewing, lip smacking, drooling, tongue biting, or loss of bowel or bladder control. °After a seizure,  you may feel sleepy for a while.  °DIAGNOSIS  °Your health care provider will ask about your symptoms and take a medical history. Descriptions from any witnesses to your seizures will be very helpful in the diagnosis. A physical exam, including a detailed neurological exam, is necessary. Various tests may be done, such as:  °· An electroencephalogram (EEG). This is a painless test of your brain waves. In this test, a diagram is created of your brain waves. These diagrams can be interpreted by a specialist. °· An MRI of the brain.   °· A CT scan of the brain.   °· A spinal tap (lumbar puncture, LP). °· Blood tests to check for signs of infection or abnormal blood chemistry. °TREATMENT  °There is no cure for epilepsy, but it is generally treatable. Once epilepsy is diagnosed, it is important to begin treatment as soon as possible. For most people with epilepsy, seizures can be controlled with medicines. The following may also be used: °· A pacemaker for the brain (vagus nerve stimulator) can be used for people with seizures that are not well controlled by medicine. °· Surgery on the brain. °For some people, epilepsy eventually goes away. °HOME CARE INSTRUCTIONS  °· Follow your health care provider's recommendations on driving and safety in normal activities. °· Get enough rest. Lack of sleep can cause seizures. °· Only take over-the-counter or prescription medicines as directed by your health care provider. Take any prescribed medicine exactly as directed. °· Avoid any known triggers of your seizures. °· Keep a seizure diary. Record what you recall about any seizure, especially any possible trigger.   °· Make   sure the people you live and work with know that you are prone to seizures. They should receive instructions on how to help you. In general, a witness to a seizure should:   Cushion your head and body.   Turn you on your side.   Avoid unnecessarily restraining you.   Not place anything inside your  mouth.   Call for emergency medical help if there is any question about what has occurred.   Follow up with your health care provider as directed. You may need regular blood tests to monitor the levels of your medicine.  SEEK MEDICAL CARE IF:   You develop signs of infection or other illness. This might increase the risk of a seizure.   You seem to be having more frequent seizures.   Your seizure pattern is changing.  SEEK IMMEDIATE MEDICAL CARE IF:   You have a seizure that does not stop after a few moments.   You have a seizure that causes any difficulty in breathing.   You have a seizure that results in a very severe headache.   You have a seizure that leaves you with the inability to speak or use a part of your body.    This information is not intended to replace advice given to you by your health care provider. Make sure you discuss any questions you have with your health care provider.   Document Released: 04/21/2005 Document Revised: 02/09/2013 Document Reviewed: 12/01/2012 Elsevier Interactive Patient Education 2016 [employer] Urinary Tract Infection Urinary tract infections (UTIs) can develop anywhere along your urinary tract. Your urinary tract is your body's drainage system for removing wastes and extra water. Your urinary tract includes two kidneys, two ureters, a bladder, and a urethra. Your kidneys are a pair of bean-shaped organs. Each kidney is about the size of your fist. They are located below your ribs, one on each side of your spine. CAUSES Infections are caused by microbes, which are microscopic organisms, including fungi, viruses, and bacteria. These organisms are so small that they can only be seen through a microscope. Bacteria are the microbes that most commonly cause UTIs. SYMPTOMS  Symptoms of UTIs may vary by age and gender of the patient and by the location of the infection. Symptoms in young women typically include a frequent and intense urge  to urinate and a painful, burning feeling in the bladder or urethra during urination. Older women and men are more likely to be tired, shaky, and weak and have muscle aches and abdominal pain. A fever may mean the infection is in your kidneys. Other symptoms of a kidney infection include pain in your back or sides below the ribs, nausea, and vomiting. DIAGNOSIS To diagnose a UTI, your caregiver will ask you about your symptoms. Your caregiver will also ask you to provide a urine sample. The urine sample will be tested for bacteria and white blood cells. White blood cells are made by your body to help fight infection. TREATMENT  Typically, UTIs can be treated with medication. Because most UTIs are caused by a bacterial infection, they usually can be treated with the use of antibiotics. The choice of antibiotic and length of treatment depend on your symptoms and the type of bacteria causing your infection. HOME CARE INSTRUCTIONS  If you were prescribed antibiotics, take them exactly as your caregiver instructs you. Finish the medication even if you feel better after you have only taken some of the medication.  Drink enough water and fluids to keep  your urine clear or pale yellow.  Avoid caffeine, tea, and carbonated beverages. They tend to irritate your bladder.  Empty your bladder often. Avoid holding urine for long periods of time.  Empty your bladder before and after sexual intercourse.  After a bowel movement, women should cleanse from front to back. Use each tissue only once. SEEK MEDICAL CARE IF:   You have back pain.  You develop a fever.  Your symptoms do not begin to resolve within 3 days. SEEK IMMEDIATE MEDICAL CARE IF:   You have severe back pain or lower abdominal pain.  You develop chills.  You have nausea or vomiting.  You have continued burning or discomfort with urination. MAKE SURE YOU:   Understand these instructions.  Will watch your condition.  Will get help  right away if you are not doing well or get worse.   This information is not intended to replace advice given to you by your health care provider. Make sure you discuss any questions you have with your health care provider.   Document Released: 01/29/2005 Document Revised: 01/10/2015 Document Reviewed: 05/30/2011 Elsevier Interactive Patient Education [employer]

## 2015-03-25 NOTE — ED Notes (Addendum)
Pt here with parents.  Pt sitting in church and had several seizures, last known seizure has "been a while ago".  Pt also had some intermittant confusion.  C/o lower left back and hip pain which is chronic has not had morphine today.  Pt A&OX4. Pt self-caths self 4-5 times day, has not noticed any urinary frequency.  Pt reports has not been to sleep since woke up yesterday morning.

## 2015-03-25 NOTE — ED Provider Notes (Signed)
CSN: [id number]     Arrival date & time 03/25/15  1158 History   First MD Initiated Contact with Patient 03/25/15 1213     Chief Complaint  Patient presents with  . Seizures  . Altered Mental Status     (Consider location/radiation/quality/duration/timing/severity/associated sxs/prior Treatment) Patient is a 49 y.o. female presenting with altered mental status.  Altered Mental Status Presenting symptoms comment:  Shaking with lethargy Severity:  Moderate Most recent episode:  Today Episode history:  Multiple Duration:  1 minute Timing:  Intermittent Progression:  Unchanged Chronicity:  Recurrent Context comment:  Ho seizures on keppra, noncompliant this am and did  not sleep last night Associated symptoms: no abdominal pain, no fever, no light-headedness, no nausea, no rash and no vomiting     Past Medical History  Diagnosis Date  . Chronic pain syndrome     s/p narc detox fall 2011  . SPINA BIFIDA   . Scoliosis     multiple surg for same  . DEPRESSION     > ptsd (spouse suicide/gsw 11/2005  . GERD   . HYPERTENSION   . Anxiety   . Pneumothorax on right 04/03/11, 06/10/11    recurrent s/p VATS 2/13  . Seizures (HCC)     onset fall 2011 with narc detox  . Chronic back pain greater than 3 months duration   . 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 Urinary tract infection 08/08/2011    E. coli  . Spontaneous tension pneumothorax 09/17/2011    Left spontaneous pneumothorax - first occurence  . Osteoporosis     DEXA @ [facility] 09/22/14: -2.7 L fem   Past Surgical History  Procedure Laterality Date  . Lumbar laminectomy for tethered cord release      x's 4 (84,94,98, and 02)  . 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 Scoliosis  1982    neck fused w/bones  . Chest tube placement  04/03/11    S/P large right sided pneumo  . Back surgery    . Video assisted thoracoscopy  06/11/2011    Procedure: VIDEO ASSISTED THORACOSCOPY;   Surgeon: Delight Ovens, MD;  Location: [facility] OR;  Service: Thoracic;  Laterality: Right;  Stapling of Large Apical and Inferiors blebs; Mechanical Pleurodesis   . Resection of apical bleb  06/11/2011    Procedure: RESECTION OF APICAL BLEB;  Surgeon: Delight Ovens, MD;  Location: [facility] OR;  Service: Thoracic;  Laterality: Right;  and inferior bleb   Family History  Problem Relation Age of Onset  . Diabetes Father   . Arthritis Other     parent & grandparent  . Diabetes Other     grandparent   Social History  Substance Use Topics  . Smoking status: Former Smoker -- 3.00 packs/day for 27 years    Types: Cigarettes  . Smokeless tobacco: Former Neurosurgeon    Quit date: 02/07/2010  . 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  Gastrointestinal: Negative for nausea, vomiting and abdominal pain.  Skin: Negative for rash.  Neurological: Negative for light-headedness.  All other systems reviewed and are negative.     Allergies  Latex; Codeine; Erythromycin; and Sulfa antibiotics  Home Medications   Prior to Admission medications   Medication  Sig Start Date End Date Taking? Authorizing Provider  alendronate (FOSAMAX) 70 MG tablet Take 1 tablet (70 mg total) by mouth every 7 (seven) days. Take with a full glass of water on an empty stomach. 10/06/14  Yes Newt LukesValerie A Leschber, MD  ALPRAZolam Prudy Feeler(XANAX) 0.5 MG tablet Take 1 tablet (0.5 mg total) by mouth 2 (two) times daily as needed for anxiety or sleep. 10/09/14  Yes Newt LukesValerie A Leschber, MD  amLODipine (NORVASC) 5 MG tablet Take 1 tablet (5 mg total) by mouth daily. 09/04/14  Yes Newt LukesValerie A Leschber, MD  Dextromethorphan-Guaifenesin [facility](MUCINEX DM) 30-600 MG TB12 1 po bid prn cough 03/21/15  Yes Myrlene BrokerElizabeth A Crawford, MD  FLUoxetine (PROZAC) 20 MG capsule Take 3 capsules (60 mg total) by mouth daily. 02/21/15  Yes  Newt LukesValerie A Leschber, MD  [name] 0.35 MG tablet take 1 tablet by mouth once daily 01/06/13  Yes Newt LukesValerie A Leschber, MD  hydrOXYzine (VISTARIL) 50 MG capsule Take 1 capsule (50 mg total) by mouth 3 (three) times daily as needed for anxiety. 09/04/14  Yes Newt LukesValerie A Leschber, MD  ibuprofen (ADVIL,MOTRIN) 200 MG tablet Take 400 mg by mouth every 6 (six) hours as needed for moderate pain.   Yes Historical Provider, MD  levETIRAcetam (KEPPRA XR) 500 MG 24 hr tablet Take 1 tablet (500 mg total) by mouth daily. 11/14/13  Yes Newt LukesValerie A Leschber, MD  morphine (MS CONTIN) 100 MG 12 hr tablet Take 1 tablet (100 mg total) by mouth 2 (two) times daily. 03/12/15  Yes Myrlene BrokerElizabeth A Crawford, MD  Multiple Vitamin (MULITIVITAMIN WITH MINERALS) TABS Take 1 tablet by mouth daily.   Yes Historical Provider, MD  omeprazole (PRILOSEC) 20 MG capsule take 1 capsule by mouth once daily 06/20/14  Yes Newt LukesValerie A Leschber, MD  oxybutynin (DITROPAN-XL) 10 MG 24 hr tablet Take 1 tablet (10 mg total) by mouth daily. 09/04/14  Yes Newt LukesValerie A Leschber, MD  oxyCODONE (ROXICODONE) 15 MG immediate release tablet Take 1 tablet (15 mg total) by mouth 3 (three) times daily as needed for pain. 03/20/15  Yes Myrlene BrokerElizabeth A Crawford, MD  cephALEXin (KEFLEX) 500 MG capsule Take 1 capsule (500 mg total) by mouth 4 (four) times daily. 03/25/15   Mirian MoMatthew Muadh Creasy, MD   BP 108/71 mmHg  Pulse 84  Resp 20  SpO2 95% Physical Exam  Constitutional: She is oriented to person, place, and time. She appears well-developed and well-nourished.  HENT:  Head: Normocephalic and atraumatic.  Right Ear: External ear normal.  Left Ear: External ear normal.  Eyes: Conjunctivae and EOM are normal. Pupils are equal, round, and reactive to light.  Neck: Normal range of motion. Neck supple.  Cardiovascular: Normal rate, regular rhythm, normal heart sounds and intact distal pulses.   Pulmonary/Chest: Effort normal and breath sounds normal.  Abdominal: Soft. Bowel sounds are  normal. There is no tenderness.  Musculoskeletal: Normal range of motion.  Neurological: She is alert and oriented to person, place, and time. No cranial nerve deficit or sensory deficit. GCS eye subscore is 4. GCS verbal subscore is 5. GCS motor subscore is 6.  Strength 3/5 bil le  Skin: Skin is warm and dry.  Vitals reviewed.   ED Course  Procedures (including critical care time) Labs Review Labs Reviewed  COMPREHENSIVE METABOLIC PANEL - Abnormal; Notable for the following:    Sodium 134 (*)    AST 43 (*)    All other components within normal limits  URINALYSIS, ROUTINE W REFLEX MICROSCOPIC (NOT AT [facility]) -  Abnormal; Notable for the following:    APPearance CLOUDY (*)    Nitrite POSITIVE (*)    Leukocytes, UA SMALL (*)    All other components within normal limits  CBC WITH DIFFERENTIAL/PLATELET - Abnormal; Notable for the following:    Hemoglobin 15.2 (*)    All other components within normal limits  URINE RAPID DRUG SCREEN, HOSP PERFORMED - Abnormal; Notable for the following:    Opiates POSITIVE (*)    Benzodiazepines POSITIVE (*)    All other components within normal limits  URINE MICROSCOPIC-ADD ON - Abnormal; Notable for the following:    Squamous Epithelial / LPF 0-5 (*)    Bacteria, UA MANY (*)    All other components within normal limits    Imaging Review Dg Chest 2 View  03/25/2015  CLINICAL DATA:  Patient with productive cough for 2 weeks. EXAM: CHEST  2 VIEW COMPARISON:  Chest radiograph 03/20/2015. FINDINGS: Stable cardiac and mediastinal contours. Low lung volumes. Unchanged bilateral lower lung linear opacities. No new large area of pulmonary consolidation. Scoliotic changes of the thoracic spine. IMPRESSION: Low lung volumes with bibasilar linear opacities favored to represent scarring. No acute cardiopulmonary process. Electronically Signed   By: Annia Belt M.D.   On: 03/25/2015 14:47   I have personally reviewed and evaluated these images and lab results as  part of my medical decision-making.   EKG Interpretation   Date/Time:  Sunday March 25 2015 12:28:13 EST Ventricular Rate:  119 PR Interval:    QRS Duration: 86 QT Interval:  337 QTC Calculation: 474 R Axis:   57 Text Interpretation:  sinus tachycardia Low voltage, precordial leads  Minimal ST depression, diffuse leads No significant change since last  tracing Confirmed by Mirian Mo [phone]) on 03/25/2015 12:45:10 PM      MDM   Final diagnoses:  Altered mental status, unspecified altered mental status type    49 y.o. female with pertinent PMH of spina bifida with chronic self in and out catheterization, chronic leg weakness, seizures on keppra presents with concern for seizure-like activity identical to prior. Patient was noncompliant with medications morning and did not sleep last night. She has 4 events which were described as shaking with retained consciousness. This is identical to prior. On arrival patient had baseline with exception of mild shaking. She was also mildly tachycardic prior to my examination. Exam otherwise unremarkable.  Workup unremarkable with exception of UTI. Patient had resolution of symptoms with Ativan. Likely recurrent seizure versus seizure like activity. Discussed results with family and patient was calm: Discharge home. Standard return precautions given. Discharged home in stable condition with keflex.    I have reviewed all laboratory and imaging studies if ordered as above  1. Altered mental status, unspecified altered mental status type         Mirian Mo, MD 03/25/15 [phone]

## 2015-03-27 ENCOUNTER — Other Ambulatory Visit: Payer: Self-pay | Admitting: Family

## 2015-03-28 ENCOUNTER — Telehealth: Payer: Self-pay | Admitting: Internal Medicine

## 2015-03-28 NOTE — Telephone Encounter (Signed)
Pt called regarding her xanax. I see the prescription has been written but the pharmacy has not yet received it. Can you please send it in? [facility] - [address]

## 2015-03-28 NOTE — Telephone Encounter (Signed)
Spoke with pharmacy and gave verbal order to fill xanax.

## 2015-03-28 NOTE — Telephone Encounter (Signed)
Sent to pharmacy 

## 2015-03-28 NOTE — Telephone Encounter (Signed)
Patient is calling to follow up on this. Pharmacy states that they need verbals, even though they have the written script.

## 2015-03-28 NOTE — Telephone Encounter (Signed)
Pharmacy called needing approval to go ahead and fill the prescription since it is early

## 2015-03-28 NOTE — Telephone Encounter (Signed)
Faxed to pharmacy

## 2015-04-10 ENCOUNTER — Ambulatory Visit (INDEPENDENT_AMBULATORY_CARE_PROVIDER_SITE_OTHER): Payer: Medicare Other | Admitting: Internal Medicine

## 2015-04-10 ENCOUNTER — Encounter: Payer: Self-pay | Admitting: Internal Medicine

## 2015-04-10 VITALS — BP 140/90 | HR 110 | Temp 98.6°F | Resp 16 | Ht <= 58 in | Wt 120.0 lb

## 2015-04-10 DIAGNOSIS — F419 Anxiety disorder, unspecified: Secondary | ICD-10-CM

## 2015-04-10 DIAGNOSIS — G894 Chronic pain syndrome: Secondary | ICD-10-CM

## 2015-04-10 DIAGNOSIS — R569 Unspecified convulsions: Secondary | ICD-10-CM

## 2015-04-10 MED ORDER — MORPHINE SULFATE ER 100 MG PO TBCR: 100.0000 mg | EXTENDED_RELEASE_TABLET | Freq: Two times a day (BID) | ORAL | Status: DC

## 2015-04-10 MED ORDER — ALPRAZOLAM 1 MG PO TABS: 0.5000 mg | ORAL_TABLET | Freq: Two times a day (BID) | ORAL | Status: DC | PRN

## 2015-04-10 NOTE — Assessment & Plan Note (Signed)
Will increase xanax to 1 mg with allowance for 1 mg at bedtime and 0.5 mg once daily prn for anxiety with #45 per month. Given her concurrent erratic pain medicine usage this is suspicious and will need monitoring.

## 2015-04-10 NOTE — Assessment & Plan Note (Signed)
Have talked to her about returning to neurology (her previous pcp was prescribing the medicine for her) and she is not sure she wants to do that now. Is taking her keppra and no recurrence of seizure on her medicine.

## 2015-04-10 NOTE — Assessment & Plan Note (Signed)
Rx again printed early for her morphine to save trip however she is upset after getting the prescription with Do not fill until 04/13/15 (last filled 03/14/15) as [facility] after visit to taking extra MS contin for pain and admitted to taking extra oxy-ir during visit with MD. Will not change rx today as already printed. Advised that she is not likely to withdraw as she is still actively taking oxy-ir daily (up to 1 TID prn pain). She is also requesting increase to her xanax today and wants to fill that early as well (she is taking 2 at night and 1 during day sometimes). Will approve increase of xanax to 1 mg and #45 per month (1 at night time for sleep and 1/2 during the day for anxiety as needed). Will need drug screen with next refill.

## 2015-04-10 NOTE — Progress Notes (Signed)
   Subjective:    Patient ID: Rhonda Ramos, female    DOB: 10/03/1965, 49 y.o.   MRN: [id number]  HPI The patient is a 49 YO female coming in for ER follow up (seen for seizures after missing dose of keppra and staying up all night). She has been taking her medicine and no more seizures since that time. She is also having right knee pain. Fairly chronic but slightly worse recently. She wants refill of her morphine today. She also wants to increase her xanax as she does take 2 pills at night time for sleeping and needs 1 during the day as well for anxiety. Has been tried on seroquel and lunesta for sleep in the past without good results. She admits to taking extra oxy-ir for several times but still has some (filled within 1-2 weeks).   Review of Systems  Constitutional: Positive for activity change. Negative for appetite change, fatigue and unexpected weight change.  Respiratory: Negative for cough, chest tightness, shortness of breath and wheezing.   Cardiovascular: Negative for chest pain, palpitations and leg swelling.  Gastrointestinal: Negative for abdominal pain, diarrhea, constipation and abdominal distention.  Musculoskeletal: Positive for myalgias, back pain and arthralgias.  Skin: Negative.   Neurological: Positive for dizziness, weakness and numbness.  Psychiatric/Behavioral: Positive for sleep disturbance. Negative for dysphoric mood and agitation. The patient is nervous/anxious.       Objective:   Physical Exam  Constitutional: She is oriented to person, place, and time. She appears well-developed and well-nourished.  Short in stature  HENT:  Head: Normocephalic and atraumatic.  Eyes: EOM are normal.  Neck: Normal range of motion.  Cardiovascular: Normal rate and regular rhythm.   Pulmonary/Chest: Effort normal and breath sounds normal. No respiratory distress. She has no wheezes. She has no rales.  Abdominal: Soft. Bowel sounds are normal. She exhibits no distension. There is  no tenderness. There is no rebound.  Neurological: She is alert and oriented to person, place, and time. Coordination abnormal.  Uses wheelchair  Skin: Skin is warm and dry.   Filed Vitals:   04/10/15 1034  BP: 140/90  Pulse: 110  Temp: 98.6 F (37 C)  TempSrc: Oral  Resp: 16  Height: 4\' 7"  (1.397 m)  Weight: 120 lb (54.432 kg)  SpO2: 98%      Assessment & Plan:  Addendum: after the visit when given rx for morphine ER she is requesting one with earlier date as she admits to [facility] taking extra several times. Last refilled 03/14/15 and printed rx with Do not fill until 04/13/15. Unable to reprint as her contract does not allow for early fills.

## 2015-04-10 NOTE — Patient Instructions (Signed)
We have filled the xanax and increased the strength. We have given you enough to take 1 pill at night time which is the stronger dose and additionally 1/2 pill during the day (the same dose as before).   We have also refilled the morphine today. We would like you to find an orthopedic that you want to see for the hip as there is not much else I can do for it.

## 2015-04-10 NOTE — Progress Notes (Signed)
Pre visit review using our clinic review tool, if applicable. No additional management support is needed unless otherwise documented below in the visit note. 

## 2015-04-20 DIAGNOSIS — R32 Unspecified urinary incontinence: Secondary | ICD-10-CM | POA: Diagnosis not present

## 2015-04-24 ENCOUNTER — Telehealth: Payer: Self-pay | Admitting: Internal Medicine

## 2015-04-24 DIAGNOSIS — G894 Chronic pain syndrome: Secondary | ICD-10-CM

## 2015-04-24 NOTE — Telephone Encounter (Signed)
Pt states her prescription for oxyCODONE (ROXICODONE) 15 MG immediate release tablet [[id number]][[id number]] will be due on Christmas Day. [facility] and they will be closed Sat, Sun and Mon and she will need her medication before Tuesday. She is unsure what she will need to do. Please advise

## 2015-04-25 MED ORDER — OXYCODONE HCL 15 MG PO TABS: 15.0000 mg | ORAL_TABLET | Freq: Three times a day (TID) | ORAL | Status: DC | PRN

## 2015-04-25 NOTE — Telephone Encounter (Signed)
Patient aware and will come pick up on 04/26/15. She is not able to pick it up any sooner.

## 2015-04-25 NOTE — Telephone Encounter (Signed)
Printed and signed, she can come get on Thursday or Friday.

## 2015-05-08 ENCOUNTER — Telehealth: Payer: Self-pay | Admitting: Internal Medicine

## 2015-05-08 ENCOUNTER — Other Ambulatory Visit: Payer: Self-pay | Admitting: Internal Medicine

## 2015-05-08 DIAGNOSIS — G894 Chronic pain syndrome: Secondary | ICD-10-CM

## 2015-05-08 MED ORDER — MORPHINE SULFATE ER 100 MG PO TBCR: 100.0000 mg | EXTENDED_RELEASE_TABLET | Freq: Two times a day (BID) | ORAL | Status: DC

## 2015-05-08 NOTE — Telephone Encounter (Signed)
She needs to pick one pharmacy and use consistently. She has also been filling xanax at multiple locations in the past 3 months. Will sign MS contin but cannot be filled until 05/10/15. They can pick up tomorrow. As a reminder we need 24-48 hours to accommodate controlled substance prescriptions. If they need we can provide another copy of the controlled substance policy for their review.

## 2015-05-08 NOTE — Telephone Encounter (Signed)
Pt called in need her pain meds refilled.  She wants to pick it up today

## 2015-05-08 NOTE — Telephone Encounter (Signed)
Patient gets the [facility] at [facility] and said she had it filled on 04/26/15. The xanax and the MS Contin [facility]. She only needs the MS Contin and that was last filled on 04/10/15. Do you want to fill this medication? Please advise, thanks.

## 2015-05-08 NOTE — Telephone Encounter (Signed)
Patient aware and will use only one pharmacy from now on. She is not happy that she has to wait until tomorrow, but I explained that she has been requesting refills earlier and earlier every month.

## 2015-05-08 NOTE — Telephone Encounter (Signed)
Should not be due yet, last rx dated do not fill until 12/9 so this is early. Other rx was printed 12/21 and is not due yet.

## 2015-05-21 DIAGNOSIS — R32 Unspecified urinary incontinence: Secondary | ICD-10-CM | POA: Diagnosis not present

## 2015-05-24 ENCOUNTER — Telehealth: Payer: Self-pay | Admitting: Internal Medicine

## 2015-05-24 DIAGNOSIS — G894 Chronic pain syndrome: Secondary | ICD-10-CM

## 2015-05-24 MED ORDER — OXYCODONE HCL 15 MG PO TABS: 15.0000 mg | ORAL_TABLET | Freq: Three times a day (TID) | ORAL | Status: DC | PRN

## 2015-05-24 NOTE — Telephone Encounter (Signed)
Called patient and place up front in cabinet.

## 2015-05-24 NOTE — Telephone Encounter (Signed)
Pt requesting refill for oxyCODONE (ROXICODONE) 15 MG immediate release tablet [[id number]]

## 2015-05-24 NOTE — Telephone Encounter (Signed)
Printed and signed.  

## 2015-06-01 ENCOUNTER — Telehealth: Payer: Self-pay | Admitting: Internal Medicine

## 2015-06-01 NOTE — Telephone Encounter (Signed)
Is requesting written scripts to pick up on the 3rd to take to pharmacy on the 5th

## 2015-06-01 NOTE — Telephone Encounter (Signed)
Pt request refill for alprazolam to be send into [facility]. Please help, she really really need this medication before the weekend.

## 2015-06-01 NOTE — Telephone Encounter (Signed)
Pt is aware.  She will fill on the 6th

## 2015-06-01 NOTE — Telephone Encounter (Signed)
Spoke with pt about flu shot, she said she will be going up to [facility] to get it this Monday 06/04/15.

## 2015-06-01 NOTE — Telephone Encounter (Signed)
This was filled on 04/10/15 for 1 month with 1 refill so should not be due for refill until at least 06/11/15. Will not fill today.

## 2015-06-01 NOTE — Telephone Encounter (Signed)
Can be faxed so does not need to be picked up.

## 2015-06-05 ENCOUNTER — Telehealth: Payer: Self-pay | Admitting: Internal Medicine

## 2015-06-05 DIAGNOSIS — F411 Generalized anxiety disorder: Secondary | ICD-10-CM

## 2015-06-05 DIAGNOSIS — G4700 Insomnia, unspecified: Secondary | ICD-10-CM
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
## 2015-06-05 NOTE — Telephone Encounter (Signed)
I would recommend a visit with mental health to address her sleeping and mood. Will place urgent referral but there are also places that can see patients without an appointment such as [facility]. We can reduce the MS contin with the next fill. It is up to her whether she thinks she needs the oxycodone medicine. The xanax can be filled on the 3rd. Fine to keep her visit on the 7th that she has been rescheduled to.

## 2015-06-06 NOTE — Telephone Encounter (Signed)
Spoke with patient. She would like the xanax filled on 2/3 and the MS Contin. MS Contin was filled on 05/09/14. She said she wants you to go down to the next smaller dose of the MS Contin when you refill it. Thanks.

## 2015-06-07 MED ORDER — ALPRAZOLAM 1 MG PO TABS: 0.5000 mg | ORAL_TABLET | Freq: Two times a day (BID) | ORAL | Status: DC | PRN

## 2015-06-07 MED ORDER — MORPHINE SULFATE ER 60 MG PO TBCR: 60.0000 mg | EXTENDED_RELEASE_TABLET | Freq: Two times a day (BID) | ORAL | Status: AC

## 2015-06-07 NOTE — Addendum Note (Signed)
Addended by: Hillard Danker A on: 06/07/2015 10:48 AM   Modules accepted: Orders

## 2015-06-07 NOTE — Telephone Encounter (Signed)
Patient can come get the MS Contin today and it cannot be filled until 06/09/15. We will fax the alprazolam tomorrow. Patient aware.

## 2015-06-07 NOTE — Telephone Encounter (Signed)
MS contin can be picked up on 06/08/15 and xanax will be sent to the pharmacy on 06/08/15.

## 2015-06-08 ENCOUNTER — Other Ambulatory Visit: Payer: Self-pay | Admitting: Geriatric Medicine

## 2015-06-08 ENCOUNTER — Telehealth: Payer: Self-pay

## 2015-06-08 ENCOUNTER — Ambulatory Visit: Payer: Self-pay | Admitting: Internal Medicine

## 2015-06-08 MED ORDER — ALPRAZOLAM 1 MG PO TABS: 0.5000 mg | ORAL_TABLET | Freq: Two times a day (BID) | ORAL | Status: AC | PRN

## 2015-06-08 NOTE — Telephone Encounter (Signed)
Verified with pharmacy that rx for xanax is filled and ready to pick up. Patient aware.

## 2015-06-08 NOTE — Telephone Encounter (Signed)
This has already been verbally called to pharmacy by [facility].

## 2015-06-08 NOTE — Telephone Encounter (Signed)
Rhonda Ramos at [facility] called stating pt has called pharmacy 7 times today to check if Xanax Rx had been faxed. Pharmacy had not received this Rx. Verbal refill given to [facility] Pharmacist for medication to be filled 06/09/2015.  Dr Okey Dupre aware.

## 2015-06-08 NOTE — Telephone Encounter (Signed)
Please send this patients alprazolam to the pharmacy.  Patient states she is about to have a panic attack.

## 2015-06-12 ENCOUNTER — Encounter: Payer: Self-pay | Admitting: Internal Medicine

## 2015-06-12 ENCOUNTER — Ambulatory Visit (INDEPENDENT_AMBULATORY_CARE_PROVIDER_SITE_OTHER): Payer: Medicare Other | Admitting: Internal Medicine

## 2015-06-12 VITALS — HR 87 | Temp 97.8°F | Resp 16 | Ht <= 58 in | Wt 120.0 lb

## 2015-06-12 DIAGNOSIS — G894 Chronic pain syndrome: Secondary | ICD-10-CM

## 2015-06-12 DIAGNOSIS — Z79899 Other long term (current) drug therapy: Secondary | ICD-10-CM | POA: Diagnosis not present

## 2015-06-12 DIAGNOSIS — G4700 Insomnia, unspecified: Secondary | ICD-10-CM

## 2015-06-12 DIAGNOSIS — F419 Anxiety disorder, unspecified: Secondary | ICD-10-CM | POA: Diagnosis not present

## 2015-06-12 DIAGNOSIS — Z79891 Long term (current) use of opiate analgesic: Secondary | ICD-10-CM | POA: Diagnosis not present

## 2015-06-12 MED ORDER — SUVOREXANT 20 MG PO TABS: 20.0000 mg | ORAL_TABLET | Freq: Every day | ORAL | Status: AC

## 2015-06-12 NOTE — Progress Notes (Signed)
Pre visit review using our clinic review tool, if applicable. No additional management support is needed unless otherwise documented below in the visit note. 

## 2015-06-12 NOTE — Patient Instructions (Signed)
We have given you the prescription for belsomra which you take at night time. You can take it with 1 xanax. Do not take more xanax with it. Give it 1 week to get in your system to see if it will work.   We will do the urine test today and send you the results.   You can come get the new ms contin prescription on 20th or 21st for the 100 mg pills.
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
## 2015-06-17 NOTE — Assessment & Plan Note (Signed)
She is allowed to continue with her current dosing of xanax and will continue prozac until mental health can see her. Have told her that she needs to accept this and follow with psych or we will not be able to continue treating her with controlled substances and that she should try to be open and honest with them.

## 2015-06-17 NOTE — Assessment & Plan Note (Signed)
Referral to neurosurgery and will NEVER prescribe oxycodone for her again. She asked during visit about fentanyl patches (stating that in the past she needed every 2 days not 3), this request was denied. She is allowed to pick up her ms contin on Feb 21st for 100 mg ms contin as she did not adjust to the lower dose while also quitting oxycodone. Advised her that she will have to accept the psych referral that we placed in order to continue caring for her pain. She has openly admitted to breaking her contract and abusing her pain medication. If there are any more missteps during her future we will not be able to provide any narcotics or controlled substances to her and she understands this. She is to get UDS today and every month when picking up her medication and if any are inappropriate she will not be prescribed any substances henceforth.

## 2015-06-17 NOTE — Assessment & Plan Note (Signed)
She has been taking her medication inappropriately and it is unclear if this insomnia truly represents some bipolar disorder with mania manifesting with no sleep. Her substance abuse clouds the issue. Urgent referral to psych for their assistance. Rx for belsomra for sleep. Advised she is not to take more than 1 xanax for sleep.

## 2015-06-18 ENCOUNTER — Ambulatory Visit (INDEPENDENT_AMBULATORY_CARE_PROVIDER_SITE_OTHER): Payer: 59 | Admitting: Licensed Clinical Social Worker

## 2015-06-18 ENCOUNTER — Telehealth: Payer: Self-pay | Admitting: Geriatric Medicine

## 2015-06-18 ENCOUNTER — Other Ambulatory Visit: Payer: Self-pay | Admitting: Internal Medicine

## 2015-06-18 DIAGNOSIS — F411 Generalized anxiety disorder: Secondary | ICD-10-CM

## 2015-06-18 DIAGNOSIS — F332 Major depressive disorder, recurrent severe without psychotic features: Secondary | ICD-10-CM

## 2015-06-18 NOTE — Telephone Encounter (Signed)
Per our direct communication to her verbally during our visit and on the after visit summary she is not allowed to take more than 1 mg xanax with the belsomra. She should not be doing so and needs to go back down to 1 mg at night. Since this medication was just filled on 06/08/15 it is not due to be filled. Additionally her drug screen was not appropriate and was positive for clonazepam which is not prescribed for her. We will not be able to continue prescribing controlled substances for her if she continues to request early refills, take medication that is not prescribed for her. This is her last chance and any more violations will result in no more controlled substances of any kind for her. The counseling is good and we agree with continuing. We cannot change the mg of her xanax as we discussed less than 1 week ago. As we described at the visit belsomra takes 1 week to work so she should not have noticed full benefit the first night. If she has no xanax she needs to take only the belsomra. To be clear she is not allowed to take a friend's medication including clonazepam without violating our contract. If she has any questions she needs to schedule visit with me only.

## 2015-06-18 NOTE — Telephone Encounter (Signed)
Patient aware and says that she understands that one more violation will void out her pain contract with [place] and Dr. Okey Dupre will not be able to fill any more controlled substances for her. Mailed patient a copy of this message.

## 2015-06-18 NOTE — Telephone Encounter (Signed)
Patient wrote a letter asking for an increase in her xanax. She is requesting 2 mg to use to take at night along with the Belsomra. She actually wants to break them in half and take on half in the morning and one half at night with her Belsomra. She says she thinks someone has taken some of her xanax and she is wanting a refill early. She said if she can't get the xanax that she wants to take one of her friends clonazepam. She wants to know if this is ok. Please advise, thanks.

## 2015-06-19 NOTE — Telephone Encounter (Signed)
Pt has received her flu shot as of today. Pt was sick when she went to [employer].

## 2015-06-21 ENCOUNTER — Emergency Department (HOSPITAL_COMMUNITY): Payer: Medicare Other

## 2015-06-21 ENCOUNTER — Emergency Department (HOSPITAL_COMMUNITY)
Admission: EM | Admit: 2015-06-21 | Discharge: 2015-06-21 | Disposition: A | Discharge status: Home / Self Care | Payer: Medicare Other | Source: Home / Self Care | Attending: Emergency Medicine | Admitting: Emergency Medicine

## 2015-06-21 ENCOUNTER — Encounter (HOSPITAL_COMMUNITY): Payer: Self-pay | Admitting: Emergency Medicine

## 2015-06-21 ENCOUNTER — Emergency Department (HOSPITAL_COMMUNITY)
Admission: EM | Admit: 2015-06-21 | Discharge: 2015-07-04 | Disposition: E | Payer: Medicare Other | Attending: Emergency Medicine | Admitting: Emergency Medicine

## 2015-06-21 DIAGNOSIS — Z91040 Latex allergy status: Secondary | ICD-10-CM | POA: Insufficient documentation

## 2015-06-21 DIAGNOSIS — Z79899 Other long term (current) drug therapy: Secondary | ICD-10-CM | POA: Insufficient documentation

## 2015-06-21 DIAGNOSIS — M810 Age-related osteoporosis without current pathological fracture: Secondary | ICD-10-CM

## 2015-06-21 DIAGNOSIS — Z87440 Personal history of urinary (tract) infections: Secondary | ICD-10-CM | POA: Insufficient documentation

## 2015-06-21 DIAGNOSIS — R109 Unspecified abdominal pain: Secondary | ICD-10-CM | POA: Diagnosis present

## 2015-06-21 DIAGNOSIS — Q059 Spina bifida, unspecified: Secondary | ICD-10-CM | POA: Insufficient documentation

## 2015-06-21 DIAGNOSIS — K5669 Other intestinal obstruction: Secondary | ICD-10-CM | POA: Diagnosis not present

## 2015-06-21 DIAGNOSIS — K5900 Constipation, unspecified: Secondary | ICD-10-CM | POA: Diagnosis not present

## 2015-06-21 DIAGNOSIS — F329 Major depressive disorder, single episode, unspecified: Secondary | ICD-10-CM | POA: Insufficient documentation

## 2015-06-21 DIAGNOSIS — K219 Gastro-esophageal reflux disease without esophagitis: Secondary | ICD-10-CM | POA: Insufficient documentation

## 2015-06-21 DIAGNOSIS — I10 Essential (primary) hypertension: Secondary | ICD-10-CM | POA: Insufficient documentation

## 2015-06-21 DIAGNOSIS — Z8709 Personal history of other diseases of the respiratory system: Secondary | ICD-10-CM | POA: Insufficient documentation

## 2015-06-21 DIAGNOSIS — Z87891 Personal history of nicotine dependence: Secondary | ICD-10-CM

## 2015-06-21 DIAGNOSIS — I469 Cardiac arrest, cause unspecified: Secondary | ICD-10-CM | POA: Diagnosis not present

## 2015-06-21 DIAGNOSIS — F419 Anxiety disorder, unspecified: Secondary | ICD-10-CM

## 2015-06-21 DIAGNOSIS — K5901 Slow transit constipation: Secondary | ICD-10-CM | POA: Diagnosis not present

## 2015-06-21 DIAGNOSIS — R1084 Generalized abdominal pain: Secondary | ICD-10-CM | POA: Diagnosis not present

## 2015-06-21 DIAGNOSIS — N179 Acute kidney failure, unspecified: Secondary | ICD-10-CM | POA: Diagnosis not present

## 2015-06-21 DIAGNOSIS — K56609 Unspecified intestinal obstruction, unspecified as to partial versus complete obstruction: Secondary | ICD-10-CM

## 2015-06-21 DIAGNOSIS — R1032 Left lower quadrant pain: Secondary | ICD-10-CM | POA: Diagnosis not present

## 2015-06-21 DIAGNOSIS — G894 Chronic pain syndrome: Secondary | ICD-10-CM | POA: Insufficient documentation

## 2015-06-21 LAB — CBC WITH DIFFERENTIAL/PLATELET
BASOS ABS: 0 10*3/uL (ref 0.0–0.1)
Basophils Relative: 0 %
Eosinophils Absolute: 0 10*3/uL (ref 0.0–0.7)
Eosinophils Relative: 0 %
HEMATOCRIT: 56.4 % — AB (ref 36.0–46.0)
HEMOGLOBIN: 19.1 g/dL — AB (ref 12.0–15.0)
LYMPHS ABS: 1.4 10*3/uL (ref 0.7–4.0)
LYMPHS PCT: 9 %
MCH: 29.6 pg (ref 26.0–34.0)
MCHC: 33.9 g/dL (ref 30.0–36.0)
MCV: 87.3 fL (ref 78.0–100.0)
Monocytes Absolute: 1.4 10*3/uL — ABNORMAL HIGH (ref 0.1–1.0)
Monocytes Relative: 9 %
NEUTROS ABS: 13.1 10*3/uL — AB (ref 1.7–7.7)
Neutrophils Relative %: 82 %
Platelets: 300 10*3/uL (ref 150–400)
RBC: 6.46 MIL/uL — AB (ref 3.87–5.11)
RDW: 14.1 % (ref 11.5–15.5)
WBC: 15.9 10*3/uL — AB (ref 4.0–10.5)

## 2015-06-21 LAB — COMPREHENSIVE METABOLIC PANEL
ALBUMIN: 4 g/dL (ref 3.5–5.0)
ALT: 18 U/L (ref 14–54)
ANION GAP: 18 — AB (ref 5–15)
AST: 40 U/L (ref 15–41)
Alkaline Phosphatase: 83 U/L (ref 38–126)
BILIRUBIN TOTAL: 1.1 mg/dL (ref 0.3–1.2)
BUN: 23 mg/dL — AB (ref 6–20)
CHLORIDE: 105 mmol/L (ref 101–111)
CO2: 14 mmol/L — ABNORMAL LOW (ref 22–32)
Calcium: 9.9 mg/dL (ref 8.9–10.3)
Creatinine, Ser: 1.81 mg/dL — ABNORMAL HIGH (ref 0.44–1.00)
GFR calc Af Amer: 37 mL/min — ABNORMAL LOW (ref >60)
GFR, EST NON AFRICAN AMERICAN: 32 mL/min — AB (ref >60)
GLUCOSE: 131 mg/dL — AB (ref 65–99)
POTASSIUM: 3.8 mmol/L (ref 3.5–5.1)
Sodium: 137 mmol/L (ref 135–145)
TOTAL PROTEIN: 7.4 g/dL (ref 6.5–8.1)

## 2015-06-21 LAB — I-STAT CG4 LACTIC ACID, ED: LACTIC ACID, VENOUS: 8.09 mmol/L — AB (ref 0.5–2.0)

## 2015-06-21 LAB — TSH: TSH: 8.645 u[IU]/mL — ABNORMAL HIGH (ref 0.350–4.500)

## 2015-06-21 LAB — CBG MONITORING, ED: Glucose-Capillary: 115 mg/dL — ABNORMAL HIGH (ref 65–99)

## 2015-06-21 MED ORDER — VANCOMYCIN HCL IN DEXTROSE 1-5 GM/200ML-% IV SOLN
1000.0000 mg | Freq: Once | INTRAVENOUS | Status: AC
  Administered 2015-06-21: 1000 mg via INTRAVENOUS
  Filled 2015-06-21: qty 200

## 2015-06-21 MED ORDER — FLEET PEDIATRIC 3.5-9.5 GM/59ML RE ENEM: 1.0000 | ENEMA | Freq: Once | RECTAL | Status: AC

## 2015-06-21 MED ORDER — POLYETHYLENE GLYCOL 3350 17 GM/SCOOP PO POWD: ORAL | Status: AC

## 2015-06-21 MED ORDER — EPINEPHRINE HCL 0.1 MG/ML IJ SOSY
PREFILLED_SYRINGE | INTRAMUSCULAR | Status: DC | PRN
  Administered 2015-06-21 (×9): 1 mg via INTRAVENOUS

## 2015-06-21 MED ORDER — DOCUSATE SODIUM 100 MG PO CAPS: 100.0000 mg | ORAL_CAPSULE | Freq: Two times a day (BID) | ORAL | Status: AC

## 2015-06-21 MED ORDER — CALCIUM POLYCARBOPHIL 625 MG PO TABS
625.0000 mg | ORAL_TABLET | Freq: Every day | ORAL | Status: AC
Start: 2015-06-21 — End: ?

## 2015-06-21 MED ORDER — VANCOMYCIN HCL 500 MG IV SOLR: 500.0000 mg | INTRAVENOUS | Status: DC

## 2015-06-21 MED ORDER — PIPERACILLIN-TAZOBACTAM 3.375 G IVPB 30 MIN
3.3750 g | Freq: Once | INTRAVENOUS | Status: AC
  Administered 2015-06-21: 3.375 g via INTRAVENOUS
  Filled 2015-06-21: qty 50

## 2015-06-21 MED ORDER — PIPERACILLIN-TAZOBACTAM 3.375 G IVPB: 3.3750 g | Freq: Three times a day (TID) | INTRAVENOUS | Status: DC

## 2015-06-21 MED ORDER — SODIUM CHLORIDE 0.9 % IV BOLUS (SEPSIS)
1000.0000 mL | INTRAVENOUS | Status: AC
  Administered 2015-06-21 (×2): 1000 mL via INTRAVENOUS

## 2015-06-21 MED ORDER — FENTANYL CITRATE (PF) 100 MCG/2ML IJ SOLN
50.0000 ug | Freq: Once | INTRAMUSCULAR | Status: AC
  Administered 2015-06-21: 50 ug via INTRAVENOUS
  Filled 2015-06-21: qty 2

## 2015-06-21 MED ORDER — LORAZEPAM 2 MG/ML IJ SOLN
1.0000 mg | Freq: Once | INTRAMUSCULAR | Status: AC
  Administered 2015-06-21: 1 mg via INTRAVENOUS
  Filled 2015-06-21: qty 1

## 2015-06-21 MED ORDER — SODIUM CHLORIDE 0.9 % IV BOLUS (SEPSIS): 2000.0000 mL | Freq: Once | INTRAVENOUS | Status: DC

## 2015-06-21 MED ORDER — SODIUM BICARBONATE 8.4 % IV SOLN
INTRAVENOUS | Status: DC | PRN
  Administered 2015-06-21 (×4): 100 meq via INTRAVENOUS

## 2015-06-21 MED FILL — Medication: Qty: 2 | Status: AC

## 2015-06-25 ENCOUNTER — Ambulatory Visit: Payer: Self-pay | Admitting: Licensed Clinical Social Worker

## 2015-06-26 LAB — CULTURE, BLOOD (ROUTINE X 2)
CULTURE: NO GROWTH
Culture: NO GROWTH

## 2015-06-29 ENCOUNTER — Encounter: Payer: Self-pay | Admitting: Internal Medicine

## 2015-07-02 ENCOUNTER — Telehealth: Payer: Self-pay

## 2015-07-02 NOTE — Telephone Encounter (Signed)
On 06/28/2015 I received a death certificate from [employer] (original). The death certificate is for cremation. The patient is a patient of Hillard Danker. The death certificate will be taken to [facility] for signature. On 2015-07-16 I received the death certificate back from Doctor Okey Dupre. I got the death certificate ready and called the funeral home to let them know the death certificate is ready for pickup. I also faxed them a copy per their request.

## 2015-07-04 NOTE — Progress Notes (Signed)
Pharmacy Antibiotic Note  Rhonda Ramos is a 50 y.o. female admitted on 2015/07/06 with sepsis. This is likely from a urinary source.  She was seen in ED this morning with complaints of abdominal pain & constipation, but left prior to urine collection against medical advice.   She returned to ED this afternoon.  Code Sepsis called.  Pharmacy has been consulted for Vancomycin & Zosyn dosing. Acute renal failure noted.   Plan: Vancomycin 500 IV every 24 hours.  Goal trough 15-20 mcg/mL. Zosyn 3.375g IV q8h (4 hour infusion).  Check Vancomycin trough at steady state Monitor renal function and cx data    Weight: 120 lb (54.432 kg)  Temp (24hrs), Avg:98.3 F (36.8 C), Min:97.8 F (36.6 C), Max:98.8 F (37.1 C)   Recent Labs Lab 2015-07-06 1717 07-06-2015 1724  WBC 15.9*  --   CREATININE 1.81*  --   LATICACIDVEN  --  8.09*    Estimated Creatinine Clearance: 26.1 mL/min (by C-G formula based on Cr of 1.81).    Allergies  Allergen Reactions  . Latex Shortness Of Breath  . Codeine Nausea And Vomiting  . Erythromycin Nausea And Vomiting  . Sulfa Antibiotics Nausea And Vomiting    Antimicrobials this admission: Vancomycin 2/16 >>  Zosyn 2/16 >>   Dose adjustments this admission:  Microbiology results: 2/16 BCx:  2/16 UCx:    Thank you for allowing pharmacy to be a part of this patient's care.  Junita Push, PharmD, BCPS Pager: [phone] 06-Jul-2015 6:32 PM

## 2015-07-04 NOTE — ED Notes (Signed)
Talked with pt and family member. Pt verbalized she was very constipated. Asked provider for stool softer.

## 2015-07-04 NOTE — ED Notes (Signed)
Pt had another BM after shift change.

## 2015-07-04 NOTE — ED Notes (Signed)
Bed: GN56 Expected date:  Expected time:  Means of arrival:  Comments: EMS 50 yo female with constipation since 2300 last night/abdominal pain

## 2015-07-04 NOTE — ED Provider Notes (Signed)
CSN: [id number]     Arrival date & time 19-Jul-2015  1442 History   First MD Initiated Contact with Patient 07-19-2015 1505     Chief Complaint  Patient presents with  . Anxiety  . Abdominal Pain     (Consider location/radiation/quality/duration/timing/severity/associated sxs/prior Treatment) HPI Comments: Pt comes in with cc of anxiety. She was seen earlier today for constipation and discharged. EMS reports that after she was discharged they received another call from the patient. When they went to see her, she started having a large BM - and thus patient didn't want to come home. They received a call again in the afternoon, and brought her to the ER.  Pt currently c/o abd pain. She reports that her abd pain started last night, and is getting worse. Pain is located in the lower quadrants, and she feels abd distention. There is some nausea, no emesis. Pt is noted to be tachycardic, HR in the 150s. Pt has hx of anxiety - and she feels very nervous right now. She  Has no chest pain, but feels short of breath. She has no cough. She has hx of COPD and pneumothorax. Pt has no hx of PE, she is not very mobile however due to her disability and chronic pain. Pt is on MS contin, xanax and sleep meds. She hasnt run out of her xanax. Denies uti like sx.   ROS 10 Systems reviewed and are negative for acute change except as noted in the HPI.     Patient is a 50 y.o. female presenting with anxiety and abdominal pain. The history is provided by the patient and medical records.  Anxiety Associated symptoms include abdominal pain.  Abdominal Pain   Past Medical History  Diagnosis Date  . Chronic pain syndrome     s/p narc detox fall 2011  . SPINA BIFIDA   . Scoliosis     multiple surg for same  . DEPRESSION     > ptsd (spouse suicide/gsw 11/2005  . GERD   . HYPERTENSION   . Anxiety   . Pneumothorax on right 04/03/11, 06/10/11    recurrent s/p VATS 2/13  . Seizures (HCC)     onset fall 2011 with  narc detox  . Chronic back pain greater than 3 months duration   . 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 Urinary tract infection 08/08/2011    E. coli  . Spontaneous tension pneumothorax 09/17/2011    Left spontaneous pneumothorax - first occurence  . Osteoporosis     DEXA @ [facility] 09/22/14: -2.7 L fem   Past Surgical History  Procedure Laterality Date  . Lumbar laminectomy for tethered cord release      x's 4 (84,94,98, and 02)  . 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 Scoliosis  1982    neck fused w/bones  . Chest tube placement  04/03/11    S/P large right sided pneumo  . Back surgery    . Video assisted thoracoscopy  06/11/2011    Procedure: VIDEO ASSISTED THORACOSCOPY;  Surgeon: Delight Ovens, MD;  Location: [facility] OR;  Service: Thoracic;  Laterality: Right;  Stapling of Large Apical and Inferiors blebs; Mechanical Pleurodesis   . Resection of apical bleb  06/11/2011    Procedure: RESECTION OF APICAL BLEB;  Surgeon: Delight Ovens, MD;  Location: [facility] OR;  Service: Thoracic;  Laterality: Right;  and inferior bleb   Family History  Problem  Relation Age of Onset  . Diabetes Father   . Arthritis Other     parent & grandparent  . Diabetes Other     grandparent   Social History  Substance Use Topics  . Smoking status: Former Smoker -- 3.00 packs/day for 27 years    Types: Cigarettes  . Smokeless tobacco: Former Neurosurgeon    Quit date: 02/07/2010  . Alcohol Use: No     Comment: h/o pain medicines addiction ; released from rehab 11/14/ 2011   OB History    No data available     Review of Systems  Gastrointestinal: Positive for abdominal pain.      Allergies  Latex; Codeine; Erythromycin; and Sulfa antibiotics  Home Medications   Prior to Admission medications   Medication Sig Start Date End Date Taking? Authorizing Provider  ALPRAZolam Prudy Feeler) 1 MG tablet Take 0.5-1 tablets (0.5-1 mg total) by mouth 2 (two) times daily  as needed for anxiety. Patient taking differently: Take 1 mg by mouth 2 (two) times daily.  06/08/15  Yes Myrlene Broker, MD  Calcium Citrate-Vitamin D (CALCIUM + D PO) Take 1 tablet by mouth daily.   Yes Historical Provider, MD  docusate sodium (COLACE) 100 MG capsule Take 1 capsule (100 mg total) by mouth every 12 (twelve) hours. 2015/07/03  Yes Lyndal Pulley, MD  FLUoxetine (PROZAC) 20 MG capsule Take 3 capsules (60 mg total) by mouth daily. 02/21/15  Yes Newt Lukes, MD  [name] 0.35 MG tablet take 1 tablet by mouth once daily 01/06/13  Yes Newt Lukes, MD  hydrOXYzine (VISTARIL) 50 MG capsule Take 1 capsule (50 mg total) by mouth 3 (three) times daily as needed for anxiety. Patient taking differently: Take 50 mg by mouth 3 (three) times daily as needed for itching.  09/04/14  Yes Newt Lukes, MD  ibuprofen (ADVIL,MOTRIN) 200 MG tablet Take 400 mg by mouth every 6 (six) hours as needed for moderate pain.   Yes Historical Provider, MD  levETIRAcetam (KEPPRA XR) 500 MG 24 hr tablet Take 1 tablet (500 mg total) by mouth daily. 11/14/13  Yes Newt Lukes, MD  lidocaine (XYLOCAINE) 5 % ointment Apply 1 application topically 3 (three) times daily as needed for mild pain (apply to hip for pain).   Yes Historical Provider, MD  morphine (MS CONTIN) 60 MG 12 hr tablet Take 1 tablet (60 mg total) by mouth every 12 (twelve) hours. Patient taking differently: Take 60 mg by mouth 3 (three) times daily.  06/07/15  Yes Myrlene Broker, MD  Multiple Vitamin (MULITIVITAMIN WITH MINERALS) TABS Take 1 tablet by mouth daily.   Yes Historical Provider, MD  omeprazole (PRILOSEC) 20 MG capsule take 1 capsule by mouth once daily 05/08/15  Yes Myrlene Broker, MD  oxybutynin (DITROPAN-XL) 10 MG 24 hr tablet take 1 tablet by mouth once daily 06/18/15  Yes Myrlene Broker, MD  polycarbophil (FIBERCON) 625 MG tablet Take 1 tablet (625 mg total) by mouth daily. Jul 03, 2015  Yes Lyndal Pulley, MD   polyethylene glycol powder (MIRALAX) powder TAKE 6 CAPFULS OF MIRALAX IN A 32 OUNCE GATORADE AND DRINK THE WHOLE BEVERAGE FOLLOWED BY 3 CAPFULS TWICE A DAY FOR THE NEXT WEEK AND FOLLOW UP WITH YOUR PRIMARY CARE PHYSICIAN. 2015/07/03  Yes Lyndal Pulley, MD  Suvorexant (BELSOMRA) 20 MG TABS Take 20 mg by mouth at bedtime. 06/12/15  Yes Myrlene Broker, MD  alendronate (FOSAMAX) 70 MG tablet Take 1 tablet (70 mg total)  by mouth every 7 (seven) days. Take with a full glass of water on an empty stomach. 10/06/14   Newt Lukes, MD  sodium phosphate Pediatric (FLEET) 3.5-9.5 GM/59ML enema Place 66 mLs (1 enema total) rectally once. 20-Jul-2015   Lyndal Pulley, MD   BP 112/57 mmHg  Pulse 133  Temp(Src) 98.8 F (37.1 C) (Oral)  Resp 18  Wt 120 lb (54.432 kg)  SpO2 93% Physical Exam  Constitutional: She is oriented to person, place, and time. She appears well-developed.  HENT:  Head: Normocephalic and atraumatic.  Eyes: Conjunctivae and EOM are normal. Pupils are equal, round, and reactive to light.  Neck: Normal range of motion. Neck supple.  Cardiovascular: Normal rate, regular rhythm and normal heart sounds.   Pulmonary/Chest: Effort normal and breath sounds normal.  tachypneic  Abdominal: Soft. Bowel sounds are normal. She exhibits no distension. There is tenderness. There is guarding. There is no rebound.  Diffuse tenderness to the abdomen, worst in the LLQ with guarding. No flank tenderness  Neurological: She is alert and oriented to person, place, and time.  Skin: Skin is warm and dry.  Nursing note and vitals reviewed.   ED Course  IO LINE INSERTION Date/Time: 2015-07-20 8:45 PM Performed by: Derwood Kaplan Authorized by: Derwood Kaplan Consent: The procedure was performed in an emergent situation. Required items: required blood products, implants, devices, and special equipment available Patient identity confirmed: arm band Indications: medication administration and rapid  vascular access Local anesthesia used: no Patient sedated: no Insertion site: left proximal tibia Site preparation: povidone-iodine Preparation: Patient was prepped and draped in the usual sterile fashion. Insertion device: 15 gauge IO needle Insertion: needle was inserted through the bony cortex Number of attempts: 1 Confirmation method: stability of the needle, easy infusion of fluids and aspiration of blood/marrow Secured with: elastic bandage and gauze dressing Patient tolerance: Patient tolerated the procedure well with no immediate complications    INTUBATION Performed by: Derwood Kaplan  Required items: required blood products, implants, devices, and special equipment available Patient identity confirmed: provided demographic data and hospital-assigned identification number Time out: Immediately prior to procedure a "time out" was called to verify the correct patient, procedure, equipment, support staff and site/side marked as required.  Indications: Airway control  Intubation method: Glidescope Laryngoscopy   Preoxygenation: BVM  Sedatives:  Paralytic:   Tube Size: 7.5 cuffed  Post-procedure assessment: chest rise and ETCO2 monitor Breath sounds: equal and absent over the epigastrium Tube secured with: ETT holder Chest x-ray interpreted by radiologist and me.  Patient tolerated the procedure well with no immediate complications.   Cardiopulmonary Resuscitation (CPR) Procedure Note Directed/Performed by: Derwood Kaplan I personally directed ancillary staff and/or performed CPR in an effort to regain return of spontaneous circulation and to maintain cardiac, neuro and systemic perfusion.      (including critical care time) Labs Review Labs Reviewed  COMPREHENSIVE METABOLIC PANEL - Abnormal; Notable for the following:    CO2 14 (*)    Glucose, Bld 131 (*)    BUN 23 (*)    Creatinine, Ser 1.81 (*)    GFR calc non Af Amer 32 (*)    GFR calc Af Amer 37 (*)     Anion gap 18 (*)    All other components within normal limits  CBC WITH DIFFERENTIAL/PLATELET - Abnormal; Notable for the following:    WBC 15.9 (*)    RBC 6.46 (*)    Hemoglobin 19.1 (*)    HCT 56.4 (*)  Neutro Abs 13.1 (*)    Monocytes Absolute 1.4 (*)    All other components within normal limits  TSH - Abnormal; Notable for the following:    TSH 8.645 (*)    All other components within normal limits  I-STAT CG4 LACTIC ACID, ED - Abnormal; Notable for the following:    Lactic Acid, Venous 8.09 (*)    All other components within normal limits  CBG MONITORING, ED - Abnormal; Notable for the following:    Glucose-Capillary 115 (*)    All other components within normal limits  URINE CULTURE  CULTURE, BLOOD (ROUTINE X 2)  CULTURE, BLOOD (ROUTINE X 2)  URINALYSIS, ROUTINE W REFLEX MICROSCOPIC (NOT AT [facility])  BLOOD GAS, ARTERIAL  I-STAT CG4 LACTIC ACID, ED    Imaging Review Ct Abdomen Pelvis Wo Contrast  07/20/15  CLINICAL DATA:  50 year old female with sepsis, suspect urosepsis. Patient in acute renal failure. EXAM: CT ABDOMEN AND PELVIS WITHOUT CONTRAST TECHNIQUE: Multidetector CT imaging of the abdomen and pelvis was performed following the standard protocol without IV contrast. COMPARISON:  Prior CT scan of the chest, abdomen and pelvis 04/04/2011 FINDINGS: Lower Chest: Nonspecific patchy airspace opacity in the dependent portion of the visualized right upper lobe. Chronic pleural parenchymal scarring and bullous emphysematous change bilaterally worse on the right than the left. The heart is within normal limits for size. Atherosclerotic calcifications noted along the course of the coronary arteries. No pericardial effusion. Mildly patulous thoracic esophagus containing oral contrast material. Abdomen: Unremarkable CT appearance of the stomach, duodenum, spleen, adrenal glands and pancreas. Normal hepatic contour and morphology. No discrete hepatic lesion. Gallbladder is  unremarkable. No intra or extrahepatic biliary ductal dilatation. Atrophic left kidney. No evidence of hydronephrosis or nephrolithiasis. Large stool ball in the proximal descending colon with mild surrounding wall thickening and small volume ascites. No evidence of perforation or abscess. Acute angulation of the colon at the base of the stool ball. No definite obstructing mass. Normal appendix in the right lower quadrant. Liquid stool present in the right colon. No focal small bowel abnormality. Pelvis: The bladder is decompressed and contains air unlikely secondary to recent instrumentation. No free fluid or suspicious adenopathy. Musculoskeletal: Spina bifida versus multilevel lumbar laminectomies. Advanced scoliosis. No acute osseous abnormality. Vascular: Limited evaluation in the absence of intravenous contrast. No evidence of aneurysm. Scattered atherosclerotic vascular calcifications. IMPRESSION: 1. The primary problem appears to be descending colonic obstruction secondary to a large rectal stool ball and an acute angulation of the descending colon. Although no definite soft tissue obstructing mass is visualized, this is difficult to exclude entirely given the patient's atypical anatomy. Consider GI consultation and colonoscopy. The rectum and sigmoid colon distal to the suspected point of obstruction is relatively decompressed. 2. Focal submucosal wall thickening, pericolonic inflammatory stranding and small volume free fluid noted about the descending colon in the region of the stool ball concerning for stercoral colitis. 3. Nonspecific patchy airspace opacity in the inferior aspect of the right lower lobe. This is in a dependent portion of the lung and may represent chronic dependent atelectasis, or potentially small volume aspiration. Bronchopneumonia is also a possibility given the clinical history of sepsis. 4. Patulous thoracic esophagus containing oral contrast material concerning for reflux. 5.  Coronary artery calcifications. 6. Atrophic left kidney. 7. Air in the bladder likely secondary to recent instrumentation for urine sampling. 8. Advanced scoliosis. Electronically Signed   By: Malachy Moan M.D.   On: 07-20-2015 20:38   Dg Chest  2 View  Jun 29, 2015  CLINICAL DATA:  Constipation EXAM: CHEST  2 VIEW COMPARISON:  03/25/2015 FINDINGS: Cardiac shadow is stable. Bibasilar scarring is again identified and stable. No new focal infiltrate or sizable effusion is seen. No acute bony abnormality is noted. Stable scoliosis is noted. IMPRESSION: No active cardiopulmonary disease. Electronically Signed   By: Alcide Clever M.D.   On: 06-29-15 17:44   I have personally reviewed and evaluated these images and lab results as part of my medical decision-making.   EKG Interpretation   Date/Time:  Thursday 29-Jun-2015 16:04:53 EST Ventricular Rate:  148 PR Interval:  92 QRS Duration: 74 QT Interval:  286 QTC Calculation: 449 R Axis:   56 Text Interpretation:  Sinus tachycardia Ventricular premature complex No  acute changes Confirmed by Rhunette Croft, MD, Janey Genta [phone]) on 29-Jun-2015  5:33:58 PM      MDM   Final diagnoses:  Colonic obstruction (HCC)  Cardiac arrest (HCC)    Pt comes in with cc of abd pain, anxiety.  She is tachycardic, has tachypnea, and has severe abd tenderness. Pt was c/o constipation, but now having diarrhea.  Initial impression is that patient has SBO, Mesenteric ischemia, diverticulitis, perforation, PE, Pneumonia, thyroid storm, anxiety, benzo withdrawals (although less likely, since patient reports that she still has her xanax).  We will start aggressive workup with labs and fluids. CT abdomen ordered, Chest Xray and ekg ordered.  EMS had given 5 mg iv versed, we will give fentanyl.  @6 :00: Lactate is elevated at 8. Pt reassessed. Pain improved transiently, but coming back. She is tachycardic  -dimer and TSH will be added to the workup. Fiance stated  that pt does walk, but doesn't move around much.... So PE is possible. Lungs are still clear. CT pending. Pt broad spectrum antibiotics already ordered along with cultures.  @8 :20 I had received a call from the lab stating that patient's dimer will need a redraw. I didn't want to delay abdomen CT, so i cancelled the dimer - pt can get the VQ scan tomorrow if she is not improving. TSH is high - so she has no hyperthyroidism. I will add ABG and await repeat lactat  @9 :40: Pt went into cardiac arrest around 8:30 - RT went for ABG and found patient unresponsive. CPR initiated immediately. I had to place an IO and i intubated the patient.  Despite several rounds of CPR with epi and bicarb, we had no ROSC, pt remained in asystole and PEA. Family informed about the development. Pt was pronounced diseased at 09/19/02, after close to 40 minutes of CPR w/o patient showing any response. US showed no cardiac activity either. Family informed, and understandably upset. Chaplain requested. ME contacted. CT results reviewed.    Derwood Kaplan, MD 2015/06/29 [phone]

## 2015-07-04 NOTE — ED Notes (Signed)
Pt had a small BM, brown in color, hard to the touch.  NT did not see any blood with the BM.
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
## 2015-07-04 NOTE — ED Provider Notes (Signed)
CSN: [id number]     Arrival date & time 07-03-15  8295 History   First MD Initiated Contact with Patient 07-03-15 0701     Chief Complaint  Patient presents with  . Constipation  . Nausea     (Consider location/radiation/quality/duration/timing/severity/associated sxs/prior Treatment) Patient is a 50 y.o. female presenting with constipation. The history is provided by the patient.  Constipation Severity:  Moderate Time since last bowel movement:  2 weeks Timing:  Constant Progression:  Worsening Chronicity:  New Context: dehydration   Stool description:  None produced Relieved by:  Nothing Worsened by:  Nothing tried Ineffective treatments:  None tried Associated symptoms: abdominal pain (left sided)   Associated symptoms: no diarrhea and no fever   Risk factors: change in medication ("someone stole my xanax")   Risk factors comment:  Chronic narcotic pain medication   Past Medical History  Diagnosis Date  . Chronic pain syndrome     s/p narc detox fall 2011  . SPINA BIFIDA   . Scoliosis     multiple surg for same  . DEPRESSION     > ptsd (spouse suicide/gsw 11/2005  . GERD   . HYPERTENSION   . Anxiety   . Pneumothorax on right 04/03/11, 06/10/11    recurrent s/p VATS 2/13  . Seizures (HCC)     onset fall 2011 with narc detox  . Chronic back pain greater than 3 months duration   . 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 Urinary tract infection 08/08/2011    E. coli  . Spontaneous tension pneumothorax 09/17/2011    Left spontaneous pneumothorax - first occurence  . Osteoporosis     DEXA @ [facility] 09/22/14: -2.7 L fem   Past Surgical History  Procedure Laterality Date  . Lumbar laminectomy for tethered cord release      x's 4 (84,94,98, and 02)  . 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 Scoliosis  1982    neck fused w/bones  . Chest tube placement  04/03/11    S/P large right sided pneumo  . Back surgery    .  Video assisted thoracoscopy  06/11/2011    Procedure: VIDEO ASSISTED THORACOSCOPY;  Surgeon: Delight Ovens, MD;  Location: [facility] OR;  Service: Thoracic;  Laterality: Right;  Stapling of Large Apical and Inferiors blebs; Mechanical Pleurodesis   . Resection of apical bleb  06/11/2011    Procedure: RESECTION OF APICAL BLEB;  Surgeon: Delight Ovens, MD;  Location: [facility] OR;  Service: Thoracic;  Laterality: Right;  and inferior bleb   Family History  Problem Relation Age of Onset  . Diabetes Father   . Arthritis Other     parent & grandparent  . Diabetes Other     grandparent   Social History  Substance Use Topics  . Smoking status: Former Smoker -- 3.00 packs/day for 27 years    Types: Cigarettes  . Smokeless tobacco: Former Neurosurgeon    Quit date: 02/07/2010  . 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  Gastrointestinal: Positive for abdominal pain (left sided) and constipation. Negative for diarrhea.  All other systems reviewed and are negative.     Allergies  Latex; Codeine; Erythromycin; and Sulfa antibiotics  Home Medications  Prior to Admission medications   Medication Sig Start Date End Date Taking? Authorizing Provider  alendronate (FOSAMAX) 70 MG tablet Take 1 tablet (70 mg total) by mouth every 7 (seven) days. Take with a full glass of water on an empty stomach. 10/06/14   Newt Lukes, MD  ALPRAZolam Prudy Feeler) 1 MG tablet Take 0.5-1 tablets (0.5-1 mg total) by mouth 2 (two) times daily as needed for anxiety. 06/08/15   Myrlene Broker, MD  FLUoxetine (PROZAC) 20 MG capsule Take 3 capsules (60 mg total) by mouth daily. 02/21/15   Newt Lukes, MD  [name] 0.35 MG tablet take 1 tablet by mouth once daily 01/06/13   Newt Lukes, MD  hydrOXYzine (VISTARIL) 50 MG capsule Take 1 capsule (50 mg total) by mouth 3 (three)  times daily as needed for anxiety. 09/04/14   Newt Lukes, MD  ibuprofen (ADVIL,MOTRIN) 200 MG tablet Take 400 mg by mouth every 6 (six) hours as needed for moderate pain.    Historical Provider, MD  levETIRAcetam (KEPPRA XR) 500 MG 24 hr tablet Take 1 tablet (500 mg total) by mouth daily. 11/14/13   Newt Lukes, MD  morphine (MS CONTIN) 60 MG 12 hr tablet Take 1 tablet (60 mg total) by mouth every 12 (twelve) hours. 06/07/15   Myrlene Broker, MD  Multiple Vitamin (MULITIVITAMIN WITH MINERALS) TABS Take 1 tablet by mouth daily.    Historical Provider, MD  omeprazole (PRILOSEC) 20 MG capsule take 1 capsule by mouth once daily 05/08/15   Myrlene Broker, MD  oxybutynin (DITROPAN-XL) 10 MG 24 hr tablet take 1 tablet by mouth once daily 06/18/15   Myrlene Broker, MD  Suvorexant (BELSOMRA) 20 MG TABS Take 20 mg by mouth at bedtime. 06/12/15   Myrlene Broker, MD   BP 191/129 mmHg  Pulse 114  Temp(Src) 97.8 F (36.6 C) (Oral)  Resp 21  SpO2 98% Physical Exam  Constitutional: She is oriented to person, place, and time. She appears well-developed and well-nourished. No distress.  HENT:  Head: Normocephalic.  Eyes: Conjunctivae are normal.  Neck: Neck supple. No tracheal deviation present.  Cardiovascular: Normal rate, regular rhythm and normal heart sounds.   Pulmonary/Chest: Effort normal and breath sounds normal. No respiratory distress.  Abdominal: Soft. Bowel sounds are normal. She exhibits no distension. There is no tenderness. There is no rebound and no guarding.  Neurological: She is alert and oriented to person, place, and time.  Skin: Skin is warm and dry.  Psychiatric: She has a normal mood and affect.  Vitals reviewed.   ED Course  Procedures (including critical care time) Labs Review Labs Reviewed  URINALYSIS, ROUTINE W REFLEX MICROSCOPIC (NOT AT [facility])  POC URINE PREG, ED    Imaging Review No results found. I have personally reviewed and evaluated  these images and lab results as part of my medical decision-making.   EKG Interpretation None      MDM   Final diagnoses:  Slow transit constipation    50 year old female presents with constipation over the last 2 weeks, she states "I have been like this since I have been born but it is never been this bad". Complains of pain with straining, hard bowel movements. Patient is on chronic pain medication and Xanax, requesting anxiolysis because "someone stole my Xanax recently". She is in no acute distress, has no significant abdominal tenderness, and is otherwise well-appearing and actually had 2 firm bowel movements in the emergency department without  any apparent difficulty.  The patient's tachycardia resolved shortly after sitting in her emergency department bed. Refused further recording of vital signs. Submitted to catheterization to check for urinary tract infection but only a small amount of urine was produced. I recommended the patient stay for repeat urine collection but at this time she is requesting to leave. The patient is capable of making decisions, has no active urinary complaints and therefore low pretest probability for urinary tract infection. AFVSS. The patient has no signs of acute surgical etiology of pain and is ambulating at her baseline on discharge. No indication for emergent imaging or laboratory work, and no reason to hold the patient against her will for further workup emergently. Plan to follow up with PCP as needed and return precautions discussed for worsening or new concerning symptoms.   Lyndal Pulley, MD 07/03/15 [phone]

## 2015-07-04 NOTE — ED Notes (Signed)
Nanavati, EDP given lactic result.

## 2015-07-04 NOTE — ED Notes (Signed)
MD at bedside. 

## 2015-07-04 NOTE — Code Documentation (Signed)
Organ procurement team notified.

## 2015-07-04 NOTE — ED Notes (Signed)
Pt comes to Ed via Ems, c/o constipation,  Pain score 10 out 10. Started 11pm last night has no relief.  Hx of pain medication on board. Vs bp 174/90., pulse 94/, rr 18,

## 2015-07-04 NOTE — ED Notes (Signed)
Pt has not been taking her BP meds for weeks verbalized by EMS. BP noted

## 2015-07-04 NOTE — ED Notes (Signed)
Called into pt room by respiratory therapist , Rt asked to confirm pulse, no pulse noted, called code blue over radio, started CPR and RT bagged. EMS at bedside Ed staff and dr. Juliane Lack at bed side.

## 2015-07-04 NOTE — ED Notes (Signed)
Unable to get labs ?

## 2015-07-04 NOTE — Code Documentation (Signed)
Family at beside. Family given emotional support. 

## 2015-07-04 NOTE — Progress Notes (Signed)
PCCM INTERVAL PROGRESS NOTE  50 year old female with PMH spina bifida, seizures, recurrent PTX, and chronic pain. Asked to evaluate in emergency department in setting cardiac arrest. Patient presented to ED with CC abdominal pain. Labs notable for leukocytosis and lactic acidosis. She went to CT to have abdomen/pelvis imaged and upon returning went into cardiac arrest PEA and Asystole. I arrived about 20 minutes into the code. Several rounds of ACLS including CPR, epinephrine, and sodium bicarbonate. Had been administered. See code sheet for additional details. Patient had limited venous acces so additional venous access obtained. Despite these continued therapies, Rhonda Ramos remained pulseless in PEA and asystole for upwards of 30 minutes. Code and time of death were called 09/08/2102.    Joneen Roach, AGACNP-BC [facility] Pulmonology/Critical Care Pager [phone] or [phone]  06-27-2015 9:26 PM

## 2015-07-04 NOTE — ED Notes (Signed)
Pt in CT.

## 2015-07-04 NOTE — ED Notes (Signed)
Pt was having trouble getting off bedside toilet,  Was put back in bed, and CT in room for scan.

## 2015-07-04 NOTE — Discharge Instructions (Signed)
Constipation, Adult °Constipation is when a person has fewer than three bowel movements a week, has difficulty having a bowel movement, or has stools that are dry, hard, or larger than normal. As people grow older, constipation is more common. A low-fiber diet, not taking in enough fluids, and taking certain medicines may make constipation worse.  °CAUSES  °· Certain medicines, such as antidepressants, pain medicine, iron supplements, antacids, and water pills.   °· Certain diseases, such as diabetes, irritable bowel syndrome (IBS), thyroid disease, or depression.   °· Not drinking enough water.   °· Not eating enough fiber-rich foods.   °· Stress or travel.   °· Lack of physical activity or exercise.   °· Ignoring the urge to have a bowel movement.   °· Using laxatives too much.   °SIGNS AND SYMPTOMS  °· Having fewer than three bowel movements a week.   °· Straining to have a bowel movement.   °· Having stools that are hard, dry, or larger than normal.   °· Feeling full or bloated.   °· Pain in the lower abdomen.   °· Not feeling relief after having a bowel movement.   °DIAGNOSIS  °Your health care provider will take a medical history and perform a physical exam. Further testing may be done for severe constipation. Some tests may include: °· A barium enema X-ray to examine your rectum, colon, and, sometimes, your small intestine.   °· A sigmoidoscopy to examine your lower colon.   °· A colonoscopy to examine your entire colon. °TREATMENT  °Treatment will depend on the severity of your constipation and what is causing it. Some dietary treatments include drinking more fluids and eating more fiber-rich foods. Lifestyle treatments may include regular exercise. If these diet and lifestyle recommendations do not help, your health care provider may recommend taking over-the-counter laxative medicines to help you have bowel movements. Prescription medicines may be prescribed if over-the-counter medicines do not work.    °HOME CARE INSTRUCTIONS  °· Eat foods that have a lot of fiber, such as fruits, vegetables, whole grains, and beans. °· Limit foods high in fat and processed sugars, such as french fries, hamburgers, cookies, candies, and soda.   °· A fiber supplement may be added to your diet if you cannot get enough fiber from foods.   °· Drink enough fluids to keep your urine clear or pale yellow.   °· Exercise regularly or as directed by your health care provider.   °· Go to the restroom when you have the urge to go. Do not hold it.   °· Only take over-the-counter or prescription medicines as directed by your health care provider. Do not take other medicines for constipation without talking to your health care provider first.   °SEEK IMMEDIATE MEDICAL CARE IF:  °· You have bright red blood in your stool.   °· Your constipation lasts for more than 4 days or gets worse.   °· You have abdominal or rectal pain.   °· You have thin, pencil-like stools.   °· You have unexplained weight loss. °MAKE SURE YOU:  °· Understand these instructions. °· Will watch your condition. °· Will get help right away if you are not doing well or get worse. °  °This information is not intended to replace advice given to you by your health care provider. Make sure you discuss any questions you have with your health care provider. °  °Document Released: 01/18/2004 Document Revised: 05/12/2014 Document Reviewed: 01/31/2013 °Elsevier Interactive Patient Education ©2016 [employer] ° °Abdominal Pain, Adult °Many things can cause abdominal pain. Usually, abdominal pain is not caused by a disease and   will improve without treatment. It can often be observed and treated at home. Your health care provider will do a physical exam and possibly order blood tests and X-rays to help determine the seriousness of your pain. However, in many cases, more time must pass before a clear cause of the pain can be found. Before that point, your health care provider may not  know if you need more testing or further treatment. °HOME CARE INSTRUCTIONS °Monitor your abdominal pain for any changes. The following actions may help to alleviate any discomfort you are experiencing: °· Only take over-the-counter or prescription medicines as directed by your health care provider. °· Do not take laxatives unless directed to do so by your health care provider. °· Try a clear liquid diet (broth, tea, or water) as directed by your health care provider. Slowly move to a bland diet as tolerated. °SEEK MEDICAL CARE IF: °· You have unexplained abdominal pain. °· You have abdominal pain associated with nausea or diarrhea. °· You have pain when you urinate or have a bowel movement. °· You experience abdominal pain that wakes you in the night. °· You have abdominal pain that is worsened or improved by eating food. °· You have abdominal pain that is worsened with eating fatty foods. °· You have a fever. °SEEK IMMEDIATE MEDICAL CARE IF: °· Your pain does not go away within 2 hours. °· You keep throwing up (vomiting). °· Your pain is felt only in portions of the abdomen, such as the right side or the left lower portion of the abdomen. °· You pass bloody or black tarry stools. °MAKE SURE YOU: °· Understand these instructions. °· Will watch your condition. °· Will get help right away if you are not doing well or get worse. °  °This information is not intended to replace advice given to you by your health care provider. Make sure you discuss any questions you have with your health care provider. °  °Document Released: 01/29/2005 Document Revised: 01/10/2015 Document Reviewed: 12/29/2012 °Elsevier Interactive Patient Education ©2016 [employer] ° °

## 2015-07-04 NOTE — ED Notes (Signed)
Pt was seen earlier this morning for constipation, given miralax went home and took it  pt had BM around 1000 this morning. EMS has been out to here house 3 times today. Pt had 5 mg Versed in route, pt has 22 in left wrist. Pt is anxious and c/o abd pain.

## 2015-07-04 NOTE — Progress Notes (Deleted)
eLink Physician-Brief Progress Note Patient Name: Rhonda Ramos DOB: 04/20/1966 MRN: [id number]  Note entered in error on wrong patient.

## 2015-07-04 NOTE — ED Notes (Signed)
Pt told tech she wanted to leave. MD informed.

## 2015-07-04 DEATH — deceased

## 2015-08-14 ENCOUNTER — Encounter: Payer: Self-pay | Admitting: Physical Medicine & Rehabilitation
